# Patient Record
Sex: Female | Born: 1950 | Race: White | Hispanic: No | Smoking: Former smoker
Health system: Southern US, Community
[De-identification: ages and names within clinical notes are randomized; demographics above are authoritative.]

## PROBLEM LIST (undated history)

## (undated) DIAGNOSIS — F419 Anxiety disorder, unspecified: Secondary | ICD-10-CM

## (undated) DIAGNOSIS — F0280 Dementia in other diseases classified elsewhere without behavioral disturbance: Secondary | ICD-10-CM

## (undated) DIAGNOSIS — R011 Cardiac murmur, unspecified: Secondary | ICD-10-CM

## (undated) DIAGNOSIS — IMO0001 Reserved for inherently not codable concepts without codable children: Secondary | ICD-10-CM

## (undated) DIAGNOSIS — D649 Anemia, unspecified: Secondary | ICD-10-CM

## (undated) DIAGNOSIS — K219 Gastro-esophageal reflux disease without esophagitis: Secondary | ICD-10-CM

## (undated) DIAGNOSIS — I700 Atherosclerosis of aorta: Secondary | ICD-10-CM

## (undated) DIAGNOSIS — G8929 Other chronic pain: Secondary | ICD-10-CM

## (undated) DIAGNOSIS — G300 Alzheimer's disease with early onset: Secondary | ICD-10-CM

## (undated) DIAGNOSIS — F319 Bipolar disorder, unspecified: Secondary | ICD-10-CM

## (undated) DIAGNOSIS — E7800 Pure hypercholesterolemia, unspecified: Secondary | ICD-10-CM

## (undated) DIAGNOSIS — I5030 Unspecified diastolic (congestive) heart failure: Secondary | ICD-10-CM

## (undated) DIAGNOSIS — J449 Chronic obstructive pulmonary disease, unspecified: Secondary | ICD-10-CM

## (undated) DIAGNOSIS — K315 Obstruction of duodenum: Secondary | ICD-10-CM

## (undated) DIAGNOSIS — M1990 Unspecified osteoarthritis, unspecified site: Secondary | ICD-10-CM

## (undated) DIAGNOSIS — I10 Essential (primary) hypertension: Secondary | ICD-10-CM

## (undated) DIAGNOSIS — M549 Dorsalgia, unspecified: Secondary | ICD-10-CM

## (undated) HISTORY — DX: Bipolar disorder, unspecified: F31.9

## (undated) HISTORY — DX: Gastro-esophageal reflux disease without esophagitis: K21.9

## (undated) HISTORY — DX: Pure hypercholesterolemia, unspecified: E78.00

## (undated) HISTORY — PX: FOOT SURGERY: SHX648

## (undated) HISTORY — PX: COLONOSCOPY: SHX174

## (undated) HISTORY — PX: HEMORRHOID SURGERY: SHX153

## (undated) HISTORY — DX: Obstruction of duodenum: K31.5

---

## 2001-03-10 ENCOUNTER — Emergency Department (HOSPITAL_COMMUNITY): Admission: EM | Admit: 2001-03-10 | Discharge: 2001-03-10 | Payer: Self-pay | Admitting: Emergency Medicine

## 2001-03-12 ENCOUNTER — Emergency Department (HOSPITAL_COMMUNITY): Admission: EM | Admit: 2001-03-12 | Discharge: 2001-03-12 | Payer: Self-pay | Admitting: Emergency Medicine

## 2001-06-16 ENCOUNTER — Emergency Department (HOSPITAL_COMMUNITY): Admission: EM | Admit: 2001-06-16 | Discharge: 2001-06-16 | Payer: Self-pay | Admitting: Emergency Medicine

## 2001-06-17 ENCOUNTER — Encounter: Payer: Self-pay | Admitting: Emergency Medicine

## 2001-06-17 ENCOUNTER — Emergency Department (HOSPITAL_COMMUNITY): Admission: EM | Admit: 2001-06-17 | Discharge: 2001-06-17 | Payer: Self-pay | Admitting: Internal Medicine

## 2001-06-19 ENCOUNTER — Ambulatory Visit (HOSPITAL_COMMUNITY): Admission: RE | Admit: 2001-06-19 | Discharge: 2001-06-19 | Payer: Self-pay | Admitting: Obstetrics and Gynecology

## 2001-06-19 ENCOUNTER — Encounter: Payer: Self-pay | Admitting: Obstetrics and Gynecology

## 2004-04-29 ENCOUNTER — Ambulatory Visit (HOSPITAL_COMMUNITY): Admission: RE | Admit: 2004-04-29 | Discharge: 2004-04-29 | Payer: Self-pay | Admitting: Pulmonary Disease

## 2005-02-15 ENCOUNTER — Ambulatory Visit: Payer: Self-pay | Admitting: Family Medicine

## 2005-03-01 ENCOUNTER — Ambulatory Visit: Payer: Self-pay | Admitting: Family Medicine

## 2005-04-25 ENCOUNTER — Ambulatory Visit: Payer: Self-pay | Admitting: Family Medicine

## 2005-05-16 ENCOUNTER — Ambulatory Visit: Payer: Self-pay | Admitting: Family Medicine

## 2005-06-06 ENCOUNTER — Ambulatory Visit: Payer: Self-pay | Admitting: Family Medicine

## 2005-07-06 ENCOUNTER — Ambulatory Visit: Payer: Self-pay | Admitting: Family Medicine

## 2005-07-26 ENCOUNTER — Ambulatory Visit: Payer: Self-pay | Admitting: Family Medicine

## 2005-09-02 ENCOUNTER — Ambulatory Visit: Payer: Self-pay | Admitting: Family Medicine

## 2005-09-13 ENCOUNTER — Ambulatory Visit: Payer: Self-pay | Admitting: Family Medicine

## 2006-02-17 ENCOUNTER — Ambulatory Visit: Payer: Self-pay | Admitting: Family Medicine

## 2006-03-03 ENCOUNTER — Ambulatory Visit: Payer: Self-pay | Admitting: Family Medicine

## 2006-04-19 ENCOUNTER — Ambulatory Visit: Payer: Self-pay | Admitting: Family Medicine

## 2006-10-03 ENCOUNTER — Ambulatory Visit: Payer: Self-pay | Admitting: Family Medicine

## 2006-10-31 ENCOUNTER — Ambulatory Visit: Payer: Self-pay | Admitting: Family Medicine

## 2006-11-29 ENCOUNTER — Ambulatory Visit: Payer: Self-pay | Admitting: Family Medicine

## 2007-01-12 ENCOUNTER — Ambulatory Visit: Payer: Self-pay | Admitting: Family Medicine

## 2007-02-12 ENCOUNTER — Ambulatory Visit: Payer: Self-pay | Admitting: Family Medicine

## 2010-09-26 ENCOUNTER — Encounter: Payer: Self-pay | Admitting: Family Medicine

## 2010-12-22 ENCOUNTER — Other Ambulatory Visit: Payer: Self-pay | Admitting: Neurosurgery

## 2010-12-22 DIAGNOSIS — M47816 Spondylosis without myelopathy or radiculopathy, lumbar region: Secondary | ICD-10-CM

## 2010-12-30 ENCOUNTER — Inpatient Hospital Stay
Admission: RE | Admit: 2010-12-30 | Discharge: 2010-12-30 | Payer: Self-pay | Source: Ambulatory Visit | Attending: Neurosurgery | Admitting: Neurosurgery

## 2011-01-03 ENCOUNTER — Ambulatory Visit
Admission: RE | Admit: 2011-01-03 | Discharge: 2011-01-03 | Disposition: A | Discharge status: Home / Self Care | Payer: Medicare Other | Source: Ambulatory Visit | Attending: Neurosurgery | Admitting: Neurosurgery

## 2011-01-03 DIAGNOSIS — M47816 Spondylosis without myelopathy or radiculopathy, lumbar region: Secondary | ICD-10-CM

## 2011-01-21 NOTE — Procedures (Signed)
NAME:  Kathleen Valenzuela, Kathleen Valenzuela                           ACCOUNT NO.:  [id number]   MEDICAL RECORD NO.:  [id number]                   PATIENT TYPE:  OUT   LOCATION:  RAD                                  FACILITY:  [facility]   PHYSICIAN:  Darlin Priestly, M.D.             DATE OF BIRTH:  1951/04/25   DATE OF PROCEDURE:  DATE OF DISCHARGE:                                  ECHOCARDIOGRAM   INDICATIONS:  Patient is a 60 year old female, patient of Dr. Juanetta Gosling with a  history of shortness of breath, COPD, and history of rheumatic fever. She is  now here for a 2-D echocardiogram to evaluate LV function and valvular  structures.   FINDINGS:  1. The aorta is within normal limits at 3.3 cm.  2. The left atrium is bilaterally enlarged to 4.1 cm. There are no clots     seen.  The patient is in sinus rhythm during the procedure.  3. IVS noted to be within upper limits of normal at 1.3 and 1.8 cm     respectively.  4. The aortic valve appears to be mildly thickened with no evidence of     significant aortic stenosis and trivial aortic regurgitation.  5. The mitral valve leaflets are mildly thickened, anterior leaflet greater     than posterior.  There does not appear to be any significant prolapse.     There is a mild-to-moderate regurgitation.  6. Obstruction of the normal tricuspid valve with trivial tricuspid     regurgitation.  7. Left ventricular internal dimensions within normal limits at 4.0 and 2.9     cm respectively.  There is good overall left ventricular function with an     estimated EF of 60% with no segmental wall motion abnormality visualized.  8. Normal RV size and systolic function.   CONCLUSIONS:  1. Normal LV side and systolic function estimated at 60%.  2. Mildly thickened aortic valve with no evidence of significant aortic     stenosis and trivial aortic regurgitation.  3. Mildly thickened mitral valve leaflets, anterior greater than posterior.     There does appear to be  mild-to-moderate mitral regurgitation.  4. Obstruction of normal tricuspid valve with trivial tricuspid     regurgitation.  5. Normal RV size and systolic function.  6. Mild left atrial enlargement.      ___________________________________________                                            Darlin Priestly, M.D.   RHM/MEDQ  D:  04/29/2004  T:  04/29/2004  Job:  [id number]   cc:   Ramon Dredge L. Juanetta Gosling, M.D.  [address]  [place]  [place] [address]  Fax: [phone]

## 2015-07-23 ENCOUNTER — Encounter (INDEPENDENT_AMBULATORY_CARE_PROVIDER_SITE_OTHER): Payer: Self-pay | Admitting: *Deleted

## 2015-08-03 ENCOUNTER — Ambulatory Visit (INDEPENDENT_AMBULATORY_CARE_PROVIDER_SITE_OTHER): Payer: Medicare Other | Admitting: Internal Medicine

## 2015-08-03 ENCOUNTER — Encounter (INDEPENDENT_AMBULATORY_CARE_PROVIDER_SITE_OTHER): Payer: Self-pay | Admitting: Internal Medicine

## 2015-08-03 VITALS — BP 96/84 | HR 65 | Temp 98.3°F | Ht 62.0 in | Wt 162.9 lb

## 2015-08-03 DIAGNOSIS — J441 Chronic obstructive pulmonary disease with (acute) exacerbation: Secondary | ICD-10-CM | POA: Diagnosis not present

## 2015-08-03 DIAGNOSIS — K219 Gastro-esophageal reflux disease without esophagitis: Secondary | ICD-10-CM | POA: Insufficient documentation

## 2015-08-03 DIAGNOSIS — E7800 Pure hypercholesterolemia, unspecified: Secondary | ICD-10-CM | POA: Insufficient documentation

## 2015-08-03 DIAGNOSIS — K315 Obstruction of duodenum: Secondary | ICD-10-CM | POA: Insufficient documentation

## 2015-08-03 DIAGNOSIS — R1310 Dysphagia, unspecified: Secondary | ICD-10-CM

## 2015-08-03 DIAGNOSIS — F319 Bipolar disorder, unspecified: Secondary | ICD-10-CM | POA: Insufficient documentation

## 2015-08-03 NOTE — Patient Instructions (Signed)
DG esophagram.   

## 2015-08-03 NOTE — Progress Notes (Signed)
Subjective:    Patient ID: Kathleen Valenzuela, female    DOB: [birth year], 64 y.o.   MRN: [id number]  HPI Referred by Dr. Wenda Overland. Patient is a resident of [facility] in [place].  She tells me she is having problems with swallowing liquids.  When she takes her medications, they will not go down.  She tells me she has lost some weight. She thinks she may have lost about 30 pounds since March. Her appetite is good for the most part.  She is not having any trouble swallowing foods. She is having problems with liquids. No abdominal pain. Acid reflux is controlled with Nexium. No side effects from the Reglan. BM x 1 a day usually. No melena or BRRB.  Denies NSAIDs or BC powders. Previous patient of Dr. Britta Mccreedy  12/12/2014 H and H  11.0 and 35.1, platelet ct 2`4, Albumin 3.8, ALP 79, AST 17, Total bili 0.1, ALT 13  10/16/2014 EGD, ERCP: Dr. Britta Mccreedy:  abdominal pain, hx of duodenal stricture.   Duodenal stricture. Food within the stomach suggestive of delayed gastric emptying, likely secondary to duodenal stricture.  Stricurtre dilated at 12 mm, 13.28mm and finally 74mm.  ERCP: There were no obvious filling defects within the CBD. No evidence of masses, strictures, stones or papillary stenosis. She was able to drain adequately, her liver enzymes were normal, so sphincterotomy was not performed.  Review of Systems Past Medical History  Diagnosis Date  . Duodenal stricture   . High cholesterol   . Bipolar 1 disorder ([place])   . GERD (gastroesophageal reflux disease)     Past Surgical History  Procedure Laterality Date  . Foot surgery      No Known Allergies  No current outpatient prescriptions on file prior to visit.   No current facility-administered medications on file prior to visit.   No current outpatient prescriptions on file prior to visit.   No current facility-administered medications on file prior to visit.   Current Outpatient Prescriptions  Medication Sig Dispense Refill  . albuterol  (PROVENTIL HFA;VENTOLIN HFA) 108 (90 BASE) MCG/ACT inhaler Inhale into the lungs every 6 (six) hours as needed for wheezing or shortness of breath.    . ALPRAZolam (XANAX) 1 MG tablet Take 1 mg by mouth 3 (three) times daily.    Marland Kitchen atorvastatin (LIPITOR) 10 MG tablet Take 10 mg by mouth daily.    . Bisacodyl (DUCODYL PO) Take 10 mg by mouth.    Marland Kitchen buPROPion (WELLBUTRIN XL) 300 MG 24 hr tablet Take 300 mg by mouth daily.    Marland Kitchen dicyclomine (BENTYL) 20 MG tablet Take 20 mg by mouth 4 (four) times daily -  before meals and at bedtime.    . donepezil (ARICEPT) 5 MG tablet Take 5 mg by mouth at bedtime.    Marland Kitchen esomeprazole (NEXIUM) 40 MG capsule Take 40 mg by mouth daily at 12 noon.    . fluticasone (VERAMYST) 27.5 MCG/SPRAY nasal spray Place 2 sprays into the nose daily.    . furosemide (LASIX) 40 MG tablet Take 40 mg by mouth.    . lithium 300 MG tablet Take 300 mg by mouth 3 (three) times daily.    . meloxicam (MOBIC) 15 MG tablet Take 15 mg by mouth daily.    . Menthol-Zinc Oxide (RISAMINE) 0.44-20.625 % OINT Apply topically.    . metoCLOPramide (REGLAN) 10 MG tablet Take 10 mg by mouth 4 (four) times daily -  before meals and at bedtime.    Marland Kitchen  metoprolol succinate (TOPROL-XL) 50 MG 24 hr tablet Take 50 mg by mouth daily. Take with or immediately following a meal.    . montelukast (SINGULAIR) 10 MG tablet Take 10 mg by mouth at bedtime.    . nicotine (NICODERM CQ - DOSED IN MG/24 HOURS) 14 mg/24hr patch Place 14 mg onto the skin daily.    . Olopatadine HCl (PATADAY) 0.2 % SOLN Apply to eye.    Marland Kitchen oxyCODONE (ROXICODONE) 15 MG immediate release tablet Take 15 mg by mouth every 4 (four) hours as needed for pain.    . tizanidine (ZANAFLEX) 2 MG capsule Take 2 mg by mouth 2 (two) times daily before a meal.    . traZODone (DESYREL) 50 MG tablet Take 50 mg by mouth at bedtime.     No current facility-administered medications for this visit.        Objective:   Physical ExamBlood pressure 96/84, pulse 65,  temperature 98.3 F (36.8 C), height 5\' 2"  (1.575 m), weight 162 lb 14.4 oz (73.891 kg). Alert and oriented. Skin warm and dry. Oral mucosa is moist.   . Sclera anicteric, conjunctivae is pink. Thyroid not enlarged. No cervical lymphadenopathy. Lungs clear. Heart regular rate and rhythm.  Abdomen is soft. Bowel sounds are positive. No hepatomegaly. No abdominal masses felt. No tenderness.  No edema to lower extremities.         Assessment & Plan:  Dysphagia to liquids. Hx of duodenal stricture. Am going to get a DG esophagram. Further recommendations to follow.

## 2015-08-06 ENCOUNTER — Other Ambulatory Visit (HOSPITAL_COMMUNITY): Payer: Medicare Other

## 2015-08-12 ENCOUNTER — Other Ambulatory Visit (HOSPITAL_COMMUNITY): Payer: Medicare Other

## 2015-09-22 ENCOUNTER — Other Ambulatory Visit (HOSPITAL_COMMUNITY): Payer: Medicare Other

## 2015-09-24 ENCOUNTER — Ambulatory Visit (HOSPITAL_COMMUNITY)
Admission: RE | Admit: 2015-09-24 | Discharge: 2015-09-24 | Disposition: A | Discharge status: Home / Self Care | Payer: Medicare Other | Source: Ambulatory Visit | Attending: Internal Medicine | Admitting: Internal Medicine

## 2015-09-24 ENCOUNTER — Other Ambulatory Visit (INDEPENDENT_AMBULATORY_CARE_PROVIDER_SITE_OTHER): Payer: Self-pay | Admitting: Internal Medicine

## 2015-09-24 ENCOUNTER — Encounter (INDEPENDENT_AMBULATORY_CARE_PROVIDER_SITE_OTHER): Payer: Self-pay | Admitting: *Deleted

## 2015-09-24 DIAGNOSIS — R1310 Dysphagia, unspecified: Secondary | ICD-10-CM | POA: Insufficient documentation

## 2015-09-24 DIAGNOSIS — R933 Abnormal findings on diagnostic imaging of other parts of digestive tract: Secondary | ICD-10-CM

## 2015-09-24 DIAGNOSIS — K222 Esophageal obstruction: Secondary | ICD-10-CM | POA: Insufficient documentation

## 2015-09-24 DIAGNOSIS — K224 Dyskinesia of esophagus: Secondary | ICD-10-CM | POA: Diagnosis not present

## 2015-10-29 ENCOUNTER — Encounter (HOSPITAL_COMMUNITY): Payer: Self-pay | Admitting: *Deleted

## 2015-10-29 ENCOUNTER — Encounter (HOSPITAL_COMMUNITY)
Admission: RE | Disposition: A | Discharge status: Home / Self Care | Payer: Self-pay | Source: Ambulatory Visit | Attending: Internal Medicine

## 2015-10-29 ENCOUNTER — Ambulatory Visit (HOSPITAL_COMMUNITY)
Admission: RE | Admit: 2015-10-29 | Discharge: 2015-10-29 | Disposition: A | Discharge status: Home / Self Care | Payer: Medicare Other | Source: Ambulatory Visit | Attending: Internal Medicine | Admitting: Internal Medicine

## 2015-10-29 DIAGNOSIS — K219 Gastro-esophageal reflux disease without esophagitis: Secondary | ICD-10-CM | POA: Insufficient documentation

## 2015-10-29 DIAGNOSIS — K208 Other esophagitis: Secondary | ICD-10-CM | POA: Diagnosis not present

## 2015-10-29 DIAGNOSIS — Z791 Long term (current) use of non-steroidal anti-inflammatories (NSAID): Secondary | ICD-10-CM | POA: Diagnosis not present

## 2015-10-29 DIAGNOSIS — K2210 Ulcer of esophagus without bleeding: Secondary | ICD-10-CM | POA: Diagnosis not present

## 2015-10-29 DIAGNOSIS — F319 Bipolar disorder, unspecified: Secondary | ICD-10-CM | POA: Diagnosis not present

## 2015-10-29 DIAGNOSIS — E7800 Pure hypercholesterolemia, unspecified: Secondary | ICD-10-CM | POA: Diagnosis not present

## 2015-10-29 DIAGNOSIS — I10 Essential (primary) hypertension: Secondary | ICD-10-CM | POA: Diagnosis not present

## 2015-10-29 DIAGNOSIS — K210 Gastro-esophageal reflux disease with esophagitis: Secondary | ICD-10-CM | POA: Diagnosis not present

## 2015-10-29 DIAGNOSIS — Z79899 Other long term (current) drug therapy: Secondary | ICD-10-CM | POA: Diagnosis not present

## 2015-10-29 DIAGNOSIS — K222 Esophageal obstruction: Secondary | ICD-10-CM | POA: Insufficient documentation

## 2015-10-29 DIAGNOSIS — K315 Obstruction of duodenum: Secondary | ICD-10-CM | POA: Insufficient documentation

## 2015-10-29 DIAGNOSIS — K449 Diaphragmatic hernia without obstruction or gangrene: Secondary | ICD-10-CM

## 2015-10-29 DIAGNOSIS — R1310 Dysphagia, unspecified: Secondary | ICD-10-CM

## 2015-10-29 DIAGNOSIS — K259 Gastric ulcer, unspecified as acute or chronic, without hemorrhage or perforation: Secondary | ICD-10-CM

## 2015-10-29 DIAGNOSIS — R933 Abnormal findings on diagnostic imaging of other parts of digestive tract: Secondary | ICD-10-CM

## 2015-10-29 DIAGNOSIS — F419 Anxiety disorder, unspecified: Secondary | ICD-10-CM | POA: Diagnosis not present

## 2015-10-29 DIAGNOSIS — Z7951 Long term (current) use of inhaled steroids: Secondary | ICD-10-CM | POA: Insufficient documentation

## 2015-10-29 DIAGNOSIS — J449 Chronic obstructive pulmonary disease, unspecified: Secondary | ICD-10-CM | POA: Insufficient documentation

## 2015-10-29 DIAGNOSIS — Z87891 Personal history of nicotine dependence: Secondary | ICD-10-CM | POA: Insufficient documentation

## 2015-10-29 HISTORY — DX: Anxiety disorder, unspecified: F41.9

## 2015-10-29 HISTORY — PX: ESOPHAGEAL DILATION: SHX303

## 2015-10-29 HISTORY — DX: Chronic obstructive pulmonary disease, unspecified: J44.9

## 2015-10-29 HISTORY — DX: Reserved for inherently not codable concepts without codable children: IMO0001

## 2015-10-29 HISTORY — PX: ESOPHAGOGASTRODUODENOSCOPY: SHX5428

## 2015-10-29 HISTORY — DX: Essential (primary) hypertension: I10

## 2015-10-29 SURGERY — EGD (ESOPHAGOGASTRODUODENOSCOPY)
Anesthesia: Moderate Sedation

## 2015-10-29 MED ORDER — MIDAZOLAM HCL 5 MG/5ML IJ SOLN
INTRAMUSCULAR | Status: AC
  Filled 2015-10-29: qty 10

## 2015-10-29 MED ORDER — MEPERIDINE HCL 50 MG/ML IJ SOLN
INTRAMUSCULAR | Status: DC | PRN
  Administered 2015-10-29 (×2): 25 mg via INTRAVENOUS

## 2015-10-29 MED ORDER — STERILE WATER FOR IRRIGATION IR SOLN
Status: DC | PRN
  Administered 2015-10-29: 12:00:00

## 2015-10-29 MED ORDER — MIDAZOLAM HCL 5 MG/5ML IJ SOLN
INTRAMUSCULAR | Status: DC | PRN
  Administered 2015-10-29 (×4): 2 mg via INTRAVENOUS

## 2015-10-29 MED ORDER — MEPERIDINE HCL 50 MG/ML IJ SOLN
INTRAMUSCULAR | Status: AC
  Filled 2015-10-29: qty 1

## 2015-10-29 MED ORDER — PANTOPRAZOLE SODIUM 40 MG PO TBEC: 40.0000 mg | DELAYED_RELEASE_TABLET | Freq: Two times a day (BID) | ORAL | Status: DC

## 2015-10-29 MED ORDER — SODIUM CHLORIDE 0.9 % IV SOLN
INTRAVENOUS | Status: DC
  Administered 2015-10-29: 11:00:00 via INTRAVENOUS

## 2015-10-29 MED ORDER — BUTAMBEN-TETRACAINE-BENZOCAINE 2-2-14 % EX AERO
INHALATION_SPRAY | CUTANEOUS | Status: DC | PRN
  Administered 2015-10-29: 2 via TOPICAL

## 2015-10-29 NOTE — Discharge Instructions (Signed)
Do not take ibuprofen meloxicam or similar medications.  Can take Tylenol up to 2 g per day in divided dose as needed.  Resume other medications as before.  Check with your physician if you could come off dicyclomine until.  Physician will call with biopsy results.  No driving for 24 hours.  Repeat EGD with dilation of esophageal and duodenal stricture in 4 weeks.     Esophagogastroduodenoscopy, Care After Refer to this sheet in the next few weeks. These instructions provide you with information about caring for yourself after your procedure. Your health care provider may also give you more specific instructions. Your treatment has been planned according to current medical practices, but problems sometimes occur. Call your health care provider if you have any problems or questions after your procedure. WHAT TO EXPECT AFTER THE PROCEDURE After your procedure, it is typical to feel:  Soreness in your throat.  Pain with swallowing.  Sick to your stomach (nauseous).  Bloated.  Dizzy.  Fatigued. HOME CARE INSTRUCTIONS  Do not eat or drink anything until the numbing medicine (local anesthetic) has worn off and your gag reflex has returned. You will know that the local anesthetic has worn off when you can swallow comfortably.  Do not drive or operate machinery until directed by your health care provider.  Take medicines only as directed by your health care provider. SEEK MEDICAL CARE IF:   You cannot stop coughing.  You are not urinating at all or less than usual. SEEK IMMEDIATE MEDICAL CARE IF:  You have difficulty swallowing.  You cannot eat or drink.  You have worsening throat or chest pain.  You have dizziness or lightheadedness or you faint.  You have nausea or vomiting.  You have chills.  You have a fever.  You have severe abdominal pain.  You have black, tarry, or bloody stools.   This information is not intended to replace advice given to you by your  health care provider. Make sure you discuss any questions you have with your health care provider.   Document Released: 08/08/2012 Document Revised: 09/12/2014 Document Reviewed: 08/08/2012 Elsevier Interactive Patient Education 2016 Elsevier Inc.  Esophageal Dilatation Esophageal dilatation is a procedure to open a blocked or narrowed part of the esophagus. The esophagus is the long tube in your throat that carries food and liquid from your mouth to your stomach. The procedure is also called esophageal dilation.  You may need this procedure if you have a buildup of scar tissue in your esophagus that makes it difficult, painful, or even impossible to swallow. This can be caused by gastroesophageal reflux disease (GERD). In rare cases, people need this procedure because they have cancer of the esophagus or a problem with the way food moves through the esophagus. Sometimes you may need to have another dilatation to enlarge the opening of the esophagus gradually. LET [facility] CARE PROVIDER KNOW ABOUT:  Any allergies you have. All medicines you are taking, including vitamins, herbs, eye drops, creams, and over-the-counter medicines. Previous problems you or members of your family have had with the use of anesthetics. Any blood disorders you have. Previous surgeries you have had. Medical conditions you have. Any antibiotic medicines you are required to take before dental procedures. RISKS AND COMPLICATIONS Generally, this is a safe procedure. However, problems can occur and include: Bleeding from a tear in the lining of the esophagus. A hole (perforation) in the esophagus. BEFORE THE PROCEDURE Do not eat or drink anything after midnight on  the night before the procedure or as directed by your health care provider. Ask your health care provider about changing or stopping your regular medicines. This is especially important if you are taking diabetes medicines or blood thinners. Plan to have  someone take you home after the procedure. PROCEDURE  You will be given a medicine that makes you relaxed and sleepy (sedative). A medicine may be sprayed or gargled to numb the back of the throat. Your health care provider can use various instruments to do an esophageal dilatation. During the procedure, the instrument used will be placed in your mouth and passed down into your esophagus. Options include: Simple dilators. This instrument is carefully placed in the esophagus to stretch it. Guided wire bougies. In this method, a flexible tube (endoscope) is used to insert a wire into the esophagus. The dilator is passed over this wire to enlarge the esophagus. Then the wire is removed. Balloon dilators. An endoscope with a small balloon at the end is passed down into the esophagus. Inflating the balloon gently stretches the esophagus and opens it up. AFTER THE PROCEDURE Your blood pressure, heart rate, breathing rate, and blood oxygen level will be monitored often until the medicines you were given have worn off. Your throat may feel slightly sore and will probably still feel numb. This will improve slowly over time. You will not be allowed to eat or drink until the throat numbness has resolved. If this is a same-day procedure, you may be allowed to go home once you have been able to drink, urinate, and sit on the edge of the bed without nausea or dizziness. If this is a same-day procedure, you should have a friend or family member with you for the next 24 hours after the procedure.   This information is not intended to replace advice given to you by your health care provider. Make sure you discuss any questions you have with your health care provider.   Document Released: 10/13/2005 Document Revised: 09/12/2014 Document Reviewed: 01/01/2014 Elsevier Interactive Patient Education [employer].

## 2015-10-29 NOTE — H&P (Signed)
Kathleen Valenzuela is an 65 y.o. female.   Chief Complaint:  Patient is here for EGD and ED. HPI:  Patient is 65 year old Caucasian female with multiple medical problems presents with few months history of dysphagia to solids. She states she had her esophagus dilated few years ago and it helped until recently. She has chronic GERD. She says Nexium is not controlling her heartburn anymore. She has very good appetite. She says she gained 12 pounds over the last few months. She denies abdominal pain melena or rectal bleeding.  Following her office visit she had barium study which suggested motility disorder and also revealed distal narrowing obstructing passage of barium pill.  Past Medical History  Diagnosis Date  . Duodenal stricture   . High cholesterol   . Bipolar 1 disorder ([place])   . GERD (gastroesophageal reflux disease)   . Anxiety   . COPD (chronic obstructive pulmonary disease) ([place])   . Shortness of breath dyspnea   . Hypertension     Past Surgical History  Procedure Laterality Date  . Foot surgery    . Colonoscopy      History reviewed. No pertinent family history. Social History:  reports that she has quit smoking. Her smoking use included Cigarettes. She has a 70 pack-year smoking history. She does not have any smokeless tobacco history on file. She reports that she does not drink alcohol or use illicit drugs.  Allergies: No Known Allergies  Medications Prior to Admission  Medication Sig Dispense Refill  . albuterol (PROVENTIL HFA;VENTOLIN HFA) 108 (90 BASE) MCG/ACT inhaler Inhale into the lungs every 6 (six) hours as needed for wheezing or shortness of breath.    . ALPRAZolam (XANAX) 1 MG tablet Take 1 mg by mouth 3 (three) times daily.    Marland Kitchen atorvastatin (LIPITOR) 10 MG tablet Take 10 mg by mouth daily.    Marland Kitchen buPROPion (WELLBUTRIN XL) 300 MG 24 hr tablet Take 300 mg by mouth daily.    Marland Kitchen dicyclomine (BENTYL) 20 MG tablet Take 20 mg by mouth 4 (four) times daily -  before meals  and at bedtime.    . docusate sodium (COLACE) 100 MG capsule Take 100 mg by mouth 2 (two) times daily.    Marland Kitchen donepezil (ARICEPT) 5 MG tablet Take 5 mg by mouth at bedtime.    Marland Kitchen esomeprazole (NEXIUM) 40 MG capsule Take 40 mg by mouth daily at 12 noon.    . fluticasone (VERAMYST) 27.5 MCG/SPRAY nasal spray Place 2 sprays into the nose daily.    . Fluticasone Furoate-Vilanterol (BREO ELLIPTA) 200-25 MCG/INH AEPB Inhale 1 puff into the lungs daily.    . furosemide (LASIX) 40 MG tablet Take 40 mg by mouth.    Marland Kitchen ipratropium-albuterol (DUONEB) 0.5-2.5 (3) MG/3ML SOLN Take 3 mLs by nebulization every 8 (eight) hours as needed (shortness of breath).    . lamoTRIgine (LAMICTAL) 100 MG tablet Take 100 mg by mouth 2 (two) times daily.    Marland Kitchen lithium 300 MG tablet Take 300 mg by mouth daily.     . meloxicam (MOBIC) 15 MG tablet Take 15 mg by mouth daily.    . Menthol-Zinc Oxide 0.44-20.625 % OINT Apply 1 application topically daily as needed (for rash and redness).    . metoCLOPramide (REGLAN) 10 MG tablet Take 10 mg by mouth 4 (four) times daily -  before meals and at bedtime.    . metoprolol succinate (TOPROL-XL) 50 MG 24 hr tablet Take 50 mg by mouth daily. Take  with or immediately following a meal.    . montelukast (SINGULAIR) 10 MG tablet Take 10 mg by mouth at bedtime.    . Olopatadine HCl (PATADAY) 0.2 % SOLN Apply to eye.    Marland Kitchen oxyCODONE (ROXICODONE) 15 MG immediate release tablet Take 15 mg by mouth every 4 (four) hours as needed for pain.    . tizanidine (ZANAFLEX) 2 MG capsule Take 2 mg by mouth 2 (two) times daily before a meal.    . traZODone (DESYREL) 50 MG tablet Take 50 mg by mouth at bedtime.    Marland Kitchen ibuprofen (ADVIL,MOTRIN) 400 MG tablet Take 800 mg by mouth every 6 (six) hours as needed for moderate pain.    . promethazine (PHENERGAN) 25 MG tablet Take 25 mg by mouth every 8 (eight) hours as needed for nausea or vomiting.      No results found for this or any previous visit (from the past 48  hour(s)). No results found.  ROS  Blood pressure 109/59, pulse 64, temperature 98.6 F (37 C), temperature source Oral, resp. rate 17, height 5\' 2"  (1.575 m), weight 162 lb (73.483 kg), SpO2 97 %. Physical Exam  Constitutional: She appears well-developed and well-nourished.  HENT:  Patient is edentulous.  Eyes: Conjunctivae are normal. No scleral icterus.  Neck: No thyromegaly present.  Cardiovascular: Normal rate, regular rhythm and normal heart sounds.   No murmur heard. Respiratory: Effort normal.  GI: Soft. She exhibits no distension and no mass. There is no tenderness.  Musculoskeletal: She exhibits no edema.  Lymphadenopathy:    She has no cervical adenopathy.  Neurological: She is alert.  Skin: Skin is warm and dry.     Assessment/Plan Solid food dysphagia in patient with chronic GERD.  Abnormal barium study suggesting motility disorder and distal esophageal stricture.  Rogene Houston, MD 10/29/2015, 11:50 AM

## 2015-10-29 NOTE — Op Note (Addendum)
EGD PROCEDURE REPORT  PATIENT:  Kathleen Valenzuela  MR#:  [id number] Birthdate:  1951-08-02, 65 y.o., female Endoscopist:  Dr. Rogene Houston, MD Referred By:  Dr.  Celedonio Savage, MD Procedure Date: 10/29/2015  Procedure:   EGD with ED  Indications:  Patient is 65 year old Caucasian female with multiple medical problems who presents with three-month history of dysphagia to solids as well as liquids. She has history of esophageal stricture which was confirmed on barium pill study. She also has history of duodenal stricture. She complains of frequent heartburn despite taking Nexium. Patient is on multiple medications including meloxicam and when necessary ibuprofen.           Informed Consent:  The risks, benefits, alternatives & imponderables which include, but are not limited to, bleeding, infection, perforation, drug reaction and potential missed lesion have been reviewed.  The potential for biopsy, lesion removal, esophageal dilation, etc. have also been discussed.  Questions have been answered.  All parties agreeable.  Please see history & physical in medical record for more information.  Medications:  Demerol 50 mg IV Versed 8 mg IV Cetacaine spray topically for oropharyngeal anesthesia  First dose administered at 1157 Last dose administered at 1215  Description of procedure:  The endoscope was introduced through the mouth and advanced to the second portion of the duodenum without difficulty or limitations. The mucosal surfaces were surveyed very carefully during advancement of the scope and upon withdrawal.  Findings:  Esophagus:   Mucosa of the proximal segment was normal. Few erosions noted admit esophagus proximal to high-grade stricture not allowing passage of scope. This stricture was dilated as below And endoscope passed distally. This stricture was about 2 cm proximal to GE junction. GEJ:  39 cm Hiatus:  37 cm Stomach:   Stomach was empty and distended very well with insufflation.  Folds in the proximal stomach were normal. Examination of mucosa at gastric body was normal. 10 mm ulcer noted in prepyloric region with clean base along with few erosions. Pyloric channel was patent. Annulus fundus and cardia were unremarkable. Duodenum:   Food debris noted in duodenal bulb. Stricture noted at angle of the duodenum. I was able to see the mucosa distal to it but could not pass the scope across it.  Therapeutic/Diagnostic Maneuvers Performed:    distal esophageal stricture was dilated with balloon dilator. Balloon diet was advanced under direct vision. Balloon dye was positioned across the stricture and insufflated to a diameter of 12 mm and subsequently 13 mm. Balloon was deflated and withdrawn.  Mucosal disruption noted in the segment post dilation. Multiple biopsies taken from esophageal mucosa proximal to stricture.  Complications:  none  EBL: minimal  Impression: High-grade strictured at distal esophagus proximal to GE junction.This stricture dilated from 12 to 13.5 mm with balloon dilator. Erosive reflux esophagitis. Small sliding hiatal hernia. Prepyloric gastric ulcer with antral erosions. Stricture noted at angle of duodenum. This stricture was not dilated today. Multiple biopsies taken from esophageal mucosa looking for eosinophilic esophagitis.  Recommendations:  Standard instructions given. Patient advised to discontinue meloxicam and do not take other NSAIDs. Can take Tylenol up to 2 g per day on as-needed basis. Discontinue Nexium as it is not working. Pantoprazole 40 mg by mouth twice a day If she needs stronger pain medication she will  contact Dr. Irene Shipper office. Patient will return for repeat EGD in order to redilate esophageal stricture along with duodenal stricture dilation in 4 weeks under monitored anesthesia care.  REHMAN,NAJEEB  U  10/29/2015  12:27 PM  CC: Dr. Celedonio Savage, MD & Dr. Rayne Du ref. provider found

## 2015-10-29 NOTE — Progress Notes (Signed)
Operative note from 10/29/2015 faxed to [facility]

## 2015-11-02 ENCOUNTER — Encounter (HOSPITAL_COMMUNITY): Payer: Self-pay | Admitting: Internal Medicine

## 2015-11-04 ENCOUNTER — Other Ambulatory Visit (INDEPENDENT_AMBULATORY_CARE_PROVIDER_SITE_OTHER): Payer: Self-pay | Admitting: Internal Medicine

## 2015-11-04 ENCOUNTER — Encounter (INDEPENDENT_AMBULATORY_CARE_PROVIDER_SITE_OTHER): Payer: Self-pay | Admitting: *Deleted

## 2015-11-04 DIAGNOSIS — K222 Esophageal obstruction: Secondary | ICD-10-CM

## 2015-11-23 ENCOUNTER — Encounter (INDEPENDENT_AMBULATORY_CARE_PROVIDER_SITE_OTHER): Payer: Self-pay | Admitting: *Deleted

## 2015-11-24 ENCOUNTER — Encounter (INDEPENDENT_AMBULATORY_CARE_PROVIDER_SITE_OTHER): Payer: Self-pay | Admitting: *Deleted

## 2015-11-24 ENCOUNTER — Inpatient Hospital Stay (HOSPITAL_COMMUNITY): Admission: RE | Admit: 2015-11-24 | Payer: Medicare Other | Source: Ambulatory Visit

## 2015-11-26 ENCOUNTER — Inpatient Hospital Stay (HOSPITAL_COMMUNITY): Admission: RE | Admit: 2015-11-26 | Payer: Medicare Other | Source: Ambulatory Visit

## 2015-11-30 ENCOUNTER — Other Ambulatory Visit (HOSPITAL_COMMUNITY)
Admission: RE | Admit: 2015-11-30 | Discharge: 2015-11-30 | Disposition: A | Discharge status: Home / Self Care | Payer: Medicare Other | Source: Other Acute Inpatient Hospital | Attending: Family Medicine | Admitting: Family Medicine

## 2015-11-30 DIAGNOSIS — D649 Anemia, unspecified: Secondary | ICD-10-CM | POA: Diagnosis present

## 2015-11-30 LAB — CBC WITH DIFFERENTIAL/PLATELET
BASOS ABS: 0.1 10*3/uL (ref 0.0–0.1)
BASOS PCT: 1 %
EOS ABS: 0.7 10*3/uL (ref 0.0–0.7)
EOS PCT: 8 %
HCT: 36.1 % (ref 36.0–46.0)
HEMOGLOBIN: 10.9 g/dL — AB (ref 12.0–15.0)
LYMPHS ABS: 1.7 10*3/uL (ref 0.7–4.0)
Lymphocytes Relative: 20 %
MCH: 24.4 pg — ABNORMAL LOW (ref 26.0–34.0)
MCHC: 30.2 g/dL (ref 30.0–36.0)
MCV: 80.9 fL (ref 78.0–100.0)
Monocytes Absolute: 0.9 10*3/uL (ref 0.1–1.0)
Monocytes Relative: 11 %
NEUTROS PCT: 61 %
Neutro Abs: 5.2 10*3/uL (ref 1.7–7.7)
PLATELETS: 279 10*3/uL (ref 150–400)
RBC: 4.46 MIL/uL (ref 3.87–5.11)
RDW: 26.1 % — ABNORMAL HIGH (ref 11.5–15.5)
WBC: 8.5 10*3/uL (ref 4.0–10.5)

## 2015-12-01 ENCOUNTER — Other Ambulatory Visit (HOSPITAL_COMMUNITY)
Admission: RE | Admit: 2015-12-01 | Discharge: 2015-12-01 | Disposition: A | Discharge status: Home / Self Care | Payer: Medicare Other | Source: Other Acute Inpatient Hospital | Attending: Family Medicine | Admitting: Family Medicine

## 2015-12-01 DIAGNOSIS — F319 Bipolar disorder, unspecified: Secondary | ICD-10-CM | POA: Diagnosis present

## 2015-12-01 LAB — LITHIUM LEVEL: Lithium Lvl: 0.21 mmol/L — ABNORMAL LOW (ref 0.60–1.20)

## 2015-12-22 NOTE — Patient Instructions (Signed)
Lamia Negrin Cude  12/22/2015     @PREFPERIOPPHARMACY @   Your procedure is scheduled on  12/25/2015   Report to [facility] at  700  A.M.  Call this number if you have problems the morning of surgery:  [phone]   Remember:  Do not eat food or drink liquids after midnight.  Take these medicines the morning of surgery with A SIP OF WATER  Xanax, wellbutrin, aricept, nexium, mobic, reglan, metoprolol, singulair, oxycodone, singulair, protonix phenergan, zanaflex. Take your inhlaers before you come. Take your nebulizer before you come.   Do not wear jewelry, make-up or nail polish.  Do not wear lotions, powders, or perfumes.  You may wear deodorant.  Do not shave 48 hours prior to surgery.  Men may shave face and neck.  Do not bring valuables to the hospital.  [facility] is not responsible for any belongings or valuables.  Contacts, dentures or bridgework may not be worn into surgery.  Leave your suitcase in the car.  After surgery it may be brought to your room.  For patients admitted to the hospital, discharge time will be determined by your treatment team.  Patients discharged the day of surgery will not be allowed to drive home.   Name and phone number of your driver:   family Special instructions:  Follow the diet instructions given to you by Dr Olevia Perches office.  Please read over the following fact sheets that you were given. Coughing and Deep Breathing, Surgical Site Infection Prevention, Anesthesia Post-op Instructions and Care and Recovery After Surgery      Esophagogastroduodenoscopy Esophagogastroduodenoscopy (EGD) is a procedure that is used to examine the lining of the esophagus, stomach, and first part of the small intestine (duodenum). A long, flexible, lighted tube with a camera attached (endoscope) is inserted down the throat to view these organs. This procedure is done to detect problems or abnormalities, such as inflammation, bleeding, ulcers, or  growths, in order to treat them. The procedure lasts 5-20 minutes. It is usually an outpatient procedure, but it may need to be performed in a hospital in emergency cases. LET [facility] CARE PROVIDER KNOW ABOUT:  Any allergies you have.  All medicines you are taking, including vitamins, herbs, eye drops, creams, and over-the-counter medicines.  Previous problems you or members of your family have had with the use of anesthetics.  Any blood disorders you have.  Previous surgeries you have had.  Medical conditions you have. RISKS AND COMPLICATIONS Generally, this is a safe procedure. However, problems can occur and include:  Infection.  Bleeding.  Tearing (perforation) of the esophagus, stomach, or duodenum.  Difficulty breathing or not being able to breathe.  Excessive sweating.  Spasms of the larynx.  Slowed heartbeat.  Low blood pressure. BEFORE THE PROCEDURE  Do not eat or drink anything after midnight on the night before the procedure or as directed by your health care provider.  Do not take your regular medicines before the procedure if your health care provider asks you not to. Ask your health care provider about changing or stopping those medicines.  If you wear dentures, be prepared to remove them before the procedure.  Arrange for someone to drive you home after the procedure. PROCEDURE  A numbing medicine (local anesthetic) may be sprayed in your throat for comfort and to stop you from gagging or coughing.  You will have an IV tube inserted in a vein in your  [place] or arm. You will receive medicines and fluids through this tube.  You will be given a medicine to relax you (sedative).  A pain reliever will be given through the IV tube.  A mouth guard may be placed in your mouth to protect your teeth and to keep you from biting on the endoscope.  You will be asked to lie on your left side.  The endoscope will be inserted down your throat and into your  esophagus, stomach, and duodenum.  Air will be put through the endoscope to allow your health care provider to clearly view the lining of your esophagus.  The lining of your esophagus, stomach, and duodenum will be examined. During the exam, your health care provider may:  Remove tissue to be examined under a microscope (biopsy) for inflammation, infection, or other medical problems.  Remove growths.  Remove objects (foreign bodies) that are stuck.  Treat any bleeding with medicines or other devices that stop tissues from bleeding (hot cautery, clipping devices).  Widen (dilate) or stretch narrowed areas of your esophagus and stomach.  The endoscope will be withdrawn. AFTER THE PROCEDURE  You will be taken to a recovery area for observation. Your blood pressure, heart rate, breathing rate, and blood oxygen level will be monitored often until the medicines you were given have worn off.  Do not eat or drink anything until the numbing medicine has worn off and your gag reflex has returned. You may choke.  Your health care provider should be able to discuss his or her findings with you. It will take longer to discuss the test results if any biopsies were taken.   This information is not intended to replace advice given to you by your health care provider. Make sure you discuss any questions you have with your health care provider.   Document Released: 12/23/2004 Document Revised: 09/12/2014 Document Reviewed: 07/25/2012 Elsevier Interactive Patient Education 2016 [place]. Esophagogastroduodenoscopy, Care After Refer to this sheet in the next few weeks. These instructions provide you with information about caring for yourself after your procedure. Your health care provider may also give you more specific instructions. Your treatment has been planned according to current medical practices, but problems sometimes occur. Call your health care provider if you have any problems or questions  after your procedure. WHAT TO EXPECT AFTER THE PROCEDURE After your procedure, it is typical to feel:  Soreness in your throat.  Pain with swallowing.  Sick to your stomach (nauseous).  Bloated.  Dizzy.  Fatigued. HOME CARE INSTRUCTIONS  Do not eat or drink anything until the numbing medicine (local anesthetic) has worn off and your gag reflex has returned. You will know that the local anesthetic has worn off when you can swallow comfortably.  Do not drive or operate machinery until directed by your health care provider.  Take medicines only as directed by your health care provider. SEEK MEDICAL CARE IF:   You cannot stop coughing.  You are not urinating at all or less than usual. SEEK IMMEDIATE MEDICAL CARE IF:  You have difficulty swallowing.  You cannot eat or drink.  You have worsening throat or chest pain.  You have dizziness or lightheadedness or you faint.  You have nausea or vomiting.  You have chills.  You have a fever.  You have severe abdominal pain.  You have black, tarry, or bloody stools.   This information is not intended to replace advice given to you by your health care provider. Make sure  you discuss any questions you have with your health care provider.   Document Released: 08/08/2012 Document Revised: 09/12/2014 Document Reviewed: 08/08/2012 Elsevier Interactive Patient Education 2016 [employer] Esophageal Dilatation Esophageal dilatation is a procedure to open a blocked or narrowed part of the esophagus. The esophagus is the long tube in your throat that carries food and liquid from your mouth to your stomach. The procedure is also called esophageal dilation.  You may need this procedure if you have a buildup of scar tissue in your esophagus that makes it difficult, painful, or even impossible to swallow. This can be caused by gastroesophageal reflux disease (GERD). In rare cases, people need this procedure because they have cancer of the  esophagus or a problem with the way food moves through the esophagus. Sometimes you may need to have another dilatation to enlarge the opening of the esophagus gradually. LET [facility] CARE PROVIDER KNOW ABOUT:   Any allergies you have.  All medicines you are taking, including vitamins, herbs, eye drops, creams, and over-the-counter medicines.  Previous problems you or members of your family have had with the use of anesthetics.  Any blood disorders you have.  Previous surgeries you have had.  Medical conditions you have.  Any antibiotic medicines you are required to take before dental procedures. RISKS AND COMPLICATIONS Generally, this is a safe procedure. However, problems can occur and include:  Bleeding from a tear in the lining of the esophagus.  A hole (perforation) in the esophagus. BEFORE THE PROCEDURE  Do not eat or drink anything after midnight on the night before the procedure or as directed by your health care provider.  Ask your health care provider about changing or stopping your regular medicines. This is especially important if you are taking diabetes medicines or blood thinners.  Plan to have someone take you home after the procedure. PROCEDURE   You will be given a medicine that makes you relaxed and sleepy (sedative).  A medicine may be sprayed or gargled to numb the back of the throat.  Your health care provider can use various instruments to do an esophageal dilatation. During the procedure, the instrument used will be placed in your mouth and passed down into your esophagus. Options include:  Simple dilators. This instrument is carefully placed in the esophagus to stretch it.  Guided wire bougies. In this method, a flexible tube (endoscope) is used to insert a wire into the esophagus. The dilator is passed over this wire to enlarge the esophagus. Then the wire is removed.  Balloon dilators. An endoscope with a small balloon at the end is passed down  into the esophagus. Inflating the balloon gently stretches the esophagus and opens it up. AFTER THE PROCEDURE  Your blood pressure, heart rate, breathing rate, and blood oxygen level will be monitored often until the medicines you were given have worn off.  Your throat may feel slightly sore and will probably still feel numb. This will improve slowly over time.  You will not be allowed to eat or drink until the throat numbness has resolved.  If this is a same-day procedure, you may be allowed to go home once you have been able to drink, urinate, and sit on the edge of the bed without nausea or dizziness.  If this is a same-day procedure, you should have a friend or family member with you for the next 24 hours after the procedure.   This information is not intended to replace advice given to you  by your health care provider. Make sure you discuss any questions you have with your health care provider.   Document Released: 10/13/2005 Document Revised: 09/12/2014 Document Reviewed: 01/01/2014 Elsevier Interactive Patient Education 2016 [employer]. PATIENT INSTRUCTIONS POST-ANESTHESIA  IMMEDIATELY FOLLOWING SURGERY:  Do not drive or operate machinery for the first twenty four hours after surgery.  Do not make any important decisions for twenty four hours after surgery or while taking narcotic pain medications or sedatives.  If you develop intractable nausea and vomiting or a severe headache please notify your doctor immediately.  FOLLOW-UP:  Please make an appointment with your surgeon as instructed. You do not need to follow up with anesthesia unless specifically instructed to do so.  WOUND CARE INSTRUCTIONS (if applicable):  Keep a dry clean dressing on the anesthesia/puncture wound site if there is drainage.  Once the wound has quit draining you may leave it open to air.  Generally you should leave the bandage intact for twenty four hours unless there is drainage.  If the epidural site  drains for more than 36-48 hours please call the anesthesia department.  QUESTIONS?:  Please feel free to call your physician or the hospital operator if you have any questions, and they will be happy to assist you.

## 2015-12-23 ENCOUNTER — Other Ambulatory Visit: Payer: Self-pay

## 2015-12-23 ENCOUNTER — Encounter (HOSPITAL_COMMUNITY)
Admission: RE | Admit: 2015-12-23 | Discharge: 2015-12-23 | Disposition: A | Discharge status: Home / Self Care | Payer: Medicare Other | Source: Ambulatory Visit | Attending: Internal Medicine | Admitting: Internal Medicine

## 2015-12-23 ENCOUNTER — Encounter (HOSPITAL_COMMUNITY): Payer: Self-pay

## 2015-12-23 VITALS — BP 107/57 | HR 66 | Temp 98.2°F | Resp 20 | Ht 62.0 in | Wt 172.0 lb

## 2015-12-23 DIAGNOSIS — Z01812 Encounter for preprocedural laboratory examination: Secondary | ICD-10-CM | POA: Insufficient documentation

## 2015-12-23 DIAGNOSIS — K222 Esophageal obstruction: Secondary | ICD-10-CM

## 2015-12-23 DIAGNOSIS — Z01810 Encounter for preprocedural cardiovascular examination: Secondary | ICD-10-CM | POA: Insufficient documentation

## 2015-12-23 HISTORY — DX: Other chronic pain: G89.29

## 2015-12-23 HISTORY — DX: Dementia in other diseases classified elsewhere without behavioral disturbance: G30.0

## 2015-12-23 HISTORY — DX: Anemia, unspecified: D64.9

## 2015-12-23 HISTORY — DX: Dementia in other diseases classified elsewhere, unspecified severity, without behavioral disturbance, psychotic disturbance, mood disturbance, and anxiety: F02.80

## 2015-12-23 HISTORY — DX: Dorsalgia, unspecified: M54.9

## 2015-12-23 HISTORY — DX: Unspecified osteoarthritis, unspecified site: M19.90

## 2015-12-23 LAB — CBC WITH DIFFERENTIAL/PLATELET
BASOS PCT: 1 %
Basophils Absolute: 0.1 10*3/uL (ref 0.0–0.1)
EOS PCT: 8 %
Eosinophils Absolute: 0.8 10*3/uL — ABNORMAL HIGH (ref 0.0–0.7)
HEMATOCRIT: 38.3 % (ref 36.0–46.0)
Hemoglobin: 12.3 g/dL (ref 12.0–15.0)
Lymphocytes Relative: 27 %
Lymphs Abs: 2.5 10*3/uL (ref 0.7–4.0)
MCH: 27 pg (ref 26.0–34.0)
MCHC: 32.1 g/dL (ref 30.0–36.0)
MCV: 84 fL (ref 78.0–100.0)
MONO ABS: 0.9 10*3/uL (ref 0.1–1.0)
MONOS PCT: 10 %
NEUTROS ABS: 5.1 10*3/uL (ref 1.7–7.7)
Neutrophils Relative %: 55 %
PLATELETS: 162 10*3/uL (ref 150–400)
RBC: 4.56 MIL/uL (ref 3.87–5.11)
RDW: 24.3 % — AB (ref 11.5–15.5)
WBC: 9.2 10*3/uL (ref 4.0–10.5)

## 2015-12-23 LAB — BASIC METABOLIC PANEL
Anion gap: 10 (ref 5–15)
BUN: 11 mg/dL (ref 6–20)
CALCIUM: 9.2 mg/dL (ref 8.9–10.3)
CO2: 26 mmol/L (ref 22–32)
CREATININE: 0.65 mg/dL (ref 0.44–1.00)
Chloride: 106 mmol/L (ref 101–111)
GLUCOSE: 112 mg/dL — AB (ref 65–99)
Potassium: 3.8 mmol/L (ref 3.5–5.1)
Sodium: 142 mmol/L (ref 135–145)

## 2015-12-23 NOTE — Pre-Procedure Instructions (Signed)
Patient given information to sign up for my chart at home. 

## 2015-12-25 ENCOUNTER — Encounter (INDEPENDENT_AMBULATORY_CARE_PROVIDER_SITE_OTHER): Payer: Self-pay | Admitting: *Deleted

## 2016-01-21 NOTE — Patient Instructions (Signed)
Kathleen Valenzuela  01/21/2016     @PREFPERIOPPHARMACY @   Your procedure is scheduled on 01/29/2016.  Report to [facility] at 11:25 A.M.  Call this number if you have problems the morning of surgery:  [phone]   Remember:  Do not eat food or drink liquids after midnight.  Take these medicines the morning of surgery with A SIP OF WATER Reglan, Metoprolol, Singulair, Oxycodone if needed, Phenergan if needed, Zanaflex, Albuterol inhaler (bring with you), Xanax, Wellbutrin,  Bentyl, Aricept, Nexium, Mobic, Flonase, Veramyst, Duoneb, Lamictal, Lithium   Do not wear jewelry, make-up or nail polish.  Do not wear lotions, powders, or perfumes.  You may wear deodorant.  Do not shave 48 hours prior to surgery.  Men may shave face and neck.  Do not bring valuables to the hospital.  [facility] is not responsible for any belongings or valuables.  Contacts, dentures or bridgework may not be worn into surgery.  Leave your suitcase in the car.  After surgery it may be brought to your room.  For patients admitted to the hospital, discharge time will be determined by your treatment team.  Patients discharged the day of surgery will not be allowed to drive home.    Please read over the following fact sheets that you were given. Anesthesia Post-op Instructions     PATIENT INSTRUCTIONS POST-ANESTHESIA  IMMEDIATELY FOLLOWING SURGERY:  Do not drive or operate machinery for the first twenty four hours after surgery.  Do not make any important decisions for twenty four hours after surgery or while taking narcotic pain medications or sedatives.  If you develop intractable nausea and vomiting or a severe headache please notify your doctor immediately.  FOLLOW-UP:  Please make an appointment with your surgeon as instructed. You do not need to follow up with anesthesia unless specifically instructed to do so.  WOUND CARE INSTRUCTIONS (if applicable):  Keep a dry clean dressing on the  anesthesia/puncture wound site if there is drainage.  Once the wound has quit draining you may leave it open to air.  Generally you should leave the bandage intact for twenty four hours unless there is drainage.  If the epidural site drains for more than 36-48 hours please call the anesthesia department.  QUESTIONS?:  Please feel free to call your physician or the hospital operator if you have any questions, and they will be happy to assist you.      Esophageal Dilatation Esophageal dilatation is a procedure to open a blocked or narrowed part of the esophagus. The esophagus is the long tube in your throat that carries food and liquid from your mouth to your stomach. The procedure is also called esophageal dilation.  You may need this procedure if you have a buildup of scar tissue in your esophagus that makes it difficult, painful, or even impossible to swallow. This can be caused by gastroesophageal reflux disease (GERD). In rare cases, people need this procedure because they have cancer of the esophagus or a problem with the way food moves through the esophagus. Sometimes you may need to have another dilatation to enlarge the opening of the esophagus gradually. LET [facility] CARE PROVIDER KNOW ABOUT:   Any allergies you have.  All medicines you are taking, including vitamins, herbs, eye drops, creams, and over-the-counter medicines.  Previous problems you or members of your family have had with the use of anesthetics.  Any blood disorders you have.  Previous surgeries you have had.  Medical conditions you  have.  Any antibiotic medicines you are required to take before dental procedures. RISKS AND COMPLICATIONS Generally, this is a safe procedure. However, problems can occur and include:  Bleeding from a tear in the lining of the esophagus.  A hole (perforation) in the esophagus. BEFORE THE PROCEDURE  Do not eat or drink anything after midnight on the night before the procedure or as  directed by your health care provider.  Ask your health care provider about changing or stopping your regular medicines. This is especially important if you are taking diabetes medicines or blood thinners.  Plan to have someone take you home after the procedure. PROCEDURE   You will be given a medicine that makes you relaxed and sleepy (sedative).  A medicine may be sprayed or gargled to numb the back of the throat.  Your health care provider can use various instruments to do an esophageal dilatation. During the procedure, the instrument used will be placed in your mouth and passed down into your esophagus. Options include:  Simple dilators. This instrument is carefully placed in the esophagus to stretch it.  Guided wire bougies. In this method, a flexible tube (endoscope) is used to insert a wire into the esophagus. The dilator is passed over this wire to enlarge the esophagus. Then the wire is removed.  Balloon dilators. An endoscope with a small balloon at the end is passed down into the esophagus. Inflating the balloon gently stretches the esophagus and opens it up. AFTER THE PROCEDURE  Your blood pressure, heart rate, breathing rate, and blood oxygen level will be monitored often until the medicines you were given have worn off.  Your throat may feel slightly sore and will probably still feel numb. This will improve slowly over time.  You will not be allowed to eat or drink until the throat numbness has resolved.  If this is a same-day procedure, you may be allowed to go home once you have been able to drink, urinate, and sit on the edge of the bed without nausea or dizziness.  If this is a same-day procedure, you should have a friend or family member with you for the next 24 hours after the procedure.   This information is not intended to replace advice given to you by your health care provider. Make sure you discuss any questions you have with your health care provider.     Document Released: 10/13/2005 Document Revised: 09/12/2014 Document Reviewed: 01/01/2014 Elsevier Interactive Patient Education 2016 [employer]. Esophagogastroduodenoscopy Esophagogastroduodenoscopy (EGD) is a procedure that is used to examine the lining of the esophagus, stomach, and first part of the small intestine (duodenum). A long, flexible, lighted tube with a camera attached (endoscope) is inserted down the throat to view these organs. This procedure is done to detect problems or abnormalities, such as inflammation, bleeding, ulcers, or growths, in order to treat them. The procedure lasts 5-20 minutes. It is usually an outpatient procedure, but it may need to be performed in a hospital in emergency cases. LET [facility] CARE PROVIDER KNOW ABOUT:  Any allergies you have.  All medicines you are taking, including vitamins, herbs, eye drops, creams, and over-the-counter medicines.  Previous problems you or members of your family have had with the use of anesthetics.  Any blood disorders you have.  Previous surgeries you have had.  Medical conditions you have. RISKS AND COMPLICATIONS Generally, this is a safe procedure. However, problems can occur and include:  Infection.  Bleeding.  Tearing (perforation) of the  esophagus, stomach, or duodenum.  Difficulty breathing or not being able to breathe.  Excessive sweating.  Spasms of the larynx.  Slowed heartbeat.  Low blood pressure. BEFORE THE PROCEDURE  Do not eat or drink anything after midnight on the night before the procedure or as directed by your health care provider.  Do not take your regular medicines before the procedure if your health care provider asks you not to. Ask your health care provider about changing or stopping those medicines.  If you wear dentures, be prepared to remove them before the procedure.  Arrange for someone to drive you home after the procedure. PROCEDURE  A numbing medicine (local  anesthetic) may be sprayed in your throat for comfort and to stop you from gagging or coughing.  You will have an IV tube inserted in a vein in your [place] or arm. You will receive medicines and fluids through this tube.  You will be given a medicine to relax you (sedative).  A pain reliever will be given through the IV tube.  A mouth guard may be placed in your mouth to protect your teeth and to keep you from biting on the endoscope.  You will be asked to lie on your left side.  The endoscope will be inserted down your throat and into your esophagus, stomach, and duodenum.  Air will be put through the endoscope to allow your health care provider to clearly view the lining of your esophagus.  The lining of your esophagus, stomach, and duodenum will be examined. During the exam, your health care provider may:  Remove tissue to be examined under a microscope (biopsy) for inflammation, infection, or other medical problems.  Remove growths.  Remove objects (foreign bodies) that are stuck.  Treat any bleeding with medicines or other devices that stop tissues from bleeding (hot cautery, clipping devices).  Widen (dilate) or stretch narrowed areas of your esophagus and stomach.  The endoscope will be withdrawn. AFTER THE PROCEDURE  You will be taken to a recovery area for observation. Your blood pressure, heart rate, breathing rate, and blood oxygen level will be monitored often until the medicines you were given have worn off.  Do not eat or drink anything until the numbing medicine has worn off and your gag reflex has returned. You may choke.  Your health care provider should be able to discuss his or her findings with you. It will take longer to discuss the test results if any biopsies were taken.   This information is not intended to replace advice given to you by your health care provider. Make sure you discuss any questions you have with your health care provider.   Document  Released: 12/23/2004 Document Revised: 09/12/2014 Document Reviewed: 07/25/2012 Elsevier Interactive Patient Education [employer].

## 2016-01-25 ENCOUNTER — Encounter (HOSPITAL_COMMUNITY): Payer: Self-pay

## 2016-01-25 ENCOUNTER — Encounter (HOSPITAL_COMMUNITY)
Admission: RE | Admit: 2016-01-25 | Discharge: 2016-01-25 | Disposition: A | Discharge status: Home / Self Care | Payer: Medicare Other | Source: Ambulatory Visit | Attending: Internal Medicine | Admitting: Internal Medicine

## 2016-01-25 NOTE — Pre-Procedure Instructions (Addendum)
Patient in for PAT. Dr Patsey Berthold aware of labs drawn 4/19 and does not want them repeated. Called [facility] and spoke with Erline Levine, RN and gave her information of arrival time and meds to take am of procedure. Also sent written information with patient to give to staff. Also made [facility] aware of this. Erline Levine verbalized understanding of this. Patient is to come by [facility] for procedure. Instructed Erline Levine that patient can only take [facility] back to facility if she is accompanied by another person. She verbalized understanding of this as well.

## 2016-01-29 ENCOUNTER — Encounter (HOSPITAL_COMMUNITY)
Admission: RE | Disposition: A | Discharge status: Home / Self Care | Payer: Self-pay | Source: Ambulatory Visit | Attending: Internal Medicine

## 2016-01-29 ENCOUNTER — Encounter (HOSPITAL_COMMUNITY): Payer: Self-pay | Admitting: *Deleted

## 2016-01-29 ENCOUNTER — Ambulatory Visit (HOSPITAL_COMMUNITY): Payer: Medicare Other | Admitting: Anesthesiology

## 2016-01-29 ENCOUNTER — Ambulatory Visit (HOSPITAL_COMMUNITY)
Admission: RE | Admit: 2016-01-29 | Discharge: 2016-01-29 | Disposition: A | Discharge status: Home / Self Care | Payer: Medicare Other | Source: Ambulatory Visit | Attending: Internal Medicine | Admitting: Internal Medicine

## 2016-01-29 DIAGNOSIS — K222 Esophageal obstruction: Secondary | ICD-10-CM | POA: Diagnosis present

## 2016-01-29 DIAGNOSIS — K315 Obstruction of duodenum: Secondary | ICD-10-CM | POA: Insufficient documentation

## 2016-01-29 DIAGNOSIS — Z880 Allergy status to penicillin: Secondary | ICD-10-CM | POA: Insufficient documentation

## 2016-01-29 DIAGNOSIS — M1990 Unspecified osteoarthritis, unspecified site: Secondary | ICD-10-CM | POA: Insufficient documentation

## 2016-01-29 DIAGNOSIS — G8929 Other chronic pain: Secondary | ICD-10-CM | POA: Diagnosis not present

## 2016-01-29 DIAGNOSIS — F319 Bipolar disorder, unspecified: Secondary | ICD-10-CM | POA: Diagnosis not present

## 2016-01-29 DIAGNOSIS — E7800 Pure hypercholesterolemia, unspecified: Secondary | ICD-10-CM | POA: Insufficient documentation

## 2016-01-29 DIAGNOSIS — I10 Essential (primary) hypertension: Secondary | ICD-10-CM | POA: Insufficient documentation

## 2016-01-29 DIAGNOSIS — G300 Alzheimer's disease with early onset: Secondary | ICD-10-CM | POA: Diagnosis not present

## 2016-01-29 DIAGNOSIS — Z7951 Long term (current) use of inhaled steroids: Secondary | ICD-10-CM | POA: Diagnosis not present

## 2016-01-29 DIAGNOSIS — K766 Portal hypertension: Secondary | ICD-10-CM | POA: Diagnosis not present

## 2016-01-29 DIAGNOSIS — K219 Gastro-esophageal reflux disease without esophagitis: Secondary | ICD-10-CM | POA: Insufficient documentation

## 2016-01-29 DIAGNOSIS — K3189 Other diseases of stomach and duodenum: Secondary | ICD-10-CM | POA: Diagnosis not present

## 2016-01-29 DIAGNOSIS — Z87891 Personal history of nicotine dependence: Secondary | ICD-10-CM | POA: Diagnosis not present

## 2016-01-29 DIAGNOSIS — Z79899 Other long term (current) drug therapy: Secondary | ICD-10-CM | POA: Insufficient documentation

## 2016-01-29 DIAGNOSIS — R1319 Other dysphagia: Secondary | ICD-10-CM | POA: Insufficient documentation

## 2016-01-29 DIAGNOSIS — F0280 Dementia in other diseases classified elsewhere without behavioral disturbance: Secondary | ICD-10-CM | POA: Insufficient documentation

## 2016-01-29 DIAGNOSIS — Z79891 Long term (current) use of opiate analgesic: Secondary | ICD-10-CM | POA: Diagnosis not present

## 2016-01-29 DIAGNOSIS — R1314 Dysphagia, pharyngoesophageal phase: Secondary | ICD-10-CM | POA: Diagnosis not present

## 2016-01-29 DIAGNOSIS — J449 Chronic obstructive pulmonary disease, unspecified: Secondary | ICD-10-CM | POA: Insufficient documentation

## 2016-01-29 DIAGNOSIS — F419 Anxiety disorder, unspecified: Secondary | ICD-10-CM | POA: Insufficient documentation

## 2016-01-29 HISTORY — PX: ESOPHAGOGASTRODUODENOSCOPY (EGD) WITH PROPOFOL: SHX5813

## 2016-01-29 HISTORY — PX: ESOPHAGEAL DILATION: SHX303

## 2016-01-29 SURGERY — ESOPHAGOGASTRODUODENOSCOPY (EGD) WITH PROPOFOL
Anesthesia: Monitor Anesthesia Care

## 2016-01-29 MED ORDER — ONDANSETRON HCL 4 MG/2ML IJ SOLN: 4.0000 mg | Freq: Once | INTRAMUSCULAR | Status: DC | PRN

## 2016-01-29 MED ORDER — FENTANYL CITRATE (PF) 100 MCG/2ML IJ SOLN
25.0000 ug | INTRAMUSCULAR | Status: AC
  Administered 2016-01-29: 25 ug via INTRAVENOUS

## 2016-01-29 MED ORDER — MIDAZOLAM HCL 5 MG/5ML IJ SOLN
INTRAMUSCULAR | Status: DC | PRN
  Administered 2016-01-29: 2 mg via INTRAVENOUS

## 2016-01-29 MED ORDER — PROPOFOL 10 MG/ML IV BOLUS
INTRAVENOUS | Status: AC
  Filled 2016-01-29: qty 20

## 2016-01-29 MED ORDER — GLYCOPYRROLATE 0.2 MG/ML IJ SOLN
0.2000 mg | Freq: Once | INTRAMUSCULAR | Status: AC
  Administered 2016-01-29: 0.2 mg via INTRAVENOUS
  Filled 2016-01-29: qty 1

## 2016-01-29 MED ORDER — ONDANSETRON HCL 4 MG/2ML IJ SOLN
4.0000 mg | Freq: Once | INTRAMUSCULAR | Status: AC
  Administered 2016-01-29: 4 mg via INTRAVENOUS

## 2016-01-29 MED ORDER — LACTATED RINGERS IV SOLN
INTRAVENOUS | Status: DC
  Administered 2016-01-29: 11:00:00 via INTRAVENOUS

## 2016-01-29 MED ORDER — FENTANYL CITRATE (PF) 100 MCG/2ML IJ SOLN: 25.0000 ug | INTRAMUSCULAR | Status: DC | PRN

## 2016-01-29 MED ORDER — MIDAZOLAM HCL 2 MG/2ML IJ SOLN
INTRAMUSCULAR | Status: AC
  Filled 2016-01-29: qty 2

## 2016-01-29 MED ORDER — FENTANYL CITRATE (PF) 100 MCG/2ML IJ SOLN
INTRAMUSCULAR | Status: AC
  Filled 2016-01-29: qty 2

## 2016-01-29 MED ORDER — MIDAZOLAM HCL 2 MG/2ML IJ SOLN
1.0000 mg | INTRAMUSCULAR | Status: DC | PRN
  Administered 2016-01-29: 2 mg via INTRAVENOUS
  Filled 2016-01-29: qty 2

## 2016-01-29 MED ORDER — ONDANSETRON HCL 4 MG/2ML IJ SOLN
INTRAMUSCULAR | Status: AC
  Filled 2016-01-29: qty 2

## 2016-01-29 MED ORDER — PROPOFOL 500 MG/50ML IV EMUL
INTRAVENOUS | Status: DC | PRN
  Administered 2016-01-29: 12:00:00 via INTRAVENOUS
  Administered 2016-01-29: 125 ug/kg/min via INTRAVENOUS

## 2016-01-29 MED ORDER — BUTAMBEN-TETRACAINE-BENZOCAINE 2-2-14 % EX AERO
1.0000 | INHALATION_SPRAY | Freq: Two times a day (BID) | CUTANEOUS | Status: DC
  Administered 2016-01-29: 2 via TOPICAL

## 2016-01-29 NOTE — Op Note (Signed)
[facility] Patient Name: Kathleen Valenzuela Procedure Date: 01/29/2016 11:30 AM MRN: [id number] Date of Birth: [birth year] Attending MD: Hildred Laser , MD CSN: [id number] Age: 65 Admit Type: Outpatient Procedure:                Upper GI endoscopy Indications:              Esophageal dysphagia, Stricture of the esophagus,                            For therapy of esophageal stricture, Stenosis of                            the duodenum, For therapy of duodenal stenosis Providers:                Hildred Laser, MD, Gwenlyn Fudge, RN, Randa Spike, Technician Referring MD:             Celedonio Savage, MD Medicines:                Propofol per Anesthesia Complications:            No immediate complications. Estimated Blood Loss:     Estimated blood loss: 15 mL. Procedure:                Pre-Anesthesia Assessment:                           - Prior to the procedure, a History and Physical                            was performed, and patient medications and                            allergies were reviewed. The patient's tolerance of                            previous anesthesia was also reviewed. The risks                            and benefits of the procedure and the sedation                            options and risks were discussed with the patient.                            All questions were answered, and informed consent                            was obtained. Prior Anticoagulants: The patient                            last took previous NSAID medication 1 day prior to  the procedure. ASA Grade Assessment: III - A                            patient with severe systemic disease. After                            reviewing the risks and benefits, the patient was                            deemed in satisfactory condition to undergo the                            procedure.                           After obtaining informed consent,  the endoscope was                            passed under direct vision. Throughout the                            procedure, the patient's blood pressure, pulse, and                            oxygen saturations were monitored continuously. The                            [id number]) scope was introduced through the                            mouth, and advanced to the duodenal bulb. The upper                            GI endoscopy was accomplished without difficulty.                            The patient tolerated the procedure well. Scope In: 11:43:07 AM Scope Out: 12:00:49 PM Total Procedure Duration: 0 hours 17 minutes 42 seconds  Findings:      The upper third of the esophagus and middle third of the esophagus were       normal.      One moderate benign-appearing, intrinsic stenosis was found 32 to 35 cm       from the incisors. This measured 1 cm (inner diameter) x 3 cm (in       length) and was traversed. A TTS dilator was passed through the scope.       Dilation with a 15-16.5-18 mm balloon dilator was performed to 16.5 mm.       The dilation site was examined and showed moderate improvement in       luminal narrowing.      The Z-line was regular and was found 35 cm from the incisors.      Mild portal hypertensive gastropathy was found in the gastric fundus and       in the gastric body.      The exam of the stomach  was otherwise normal.      The duodenal bulb was normal.      An acquired benign-appearing, intrinsic mild stenosis was found in the       first portion of the duodenum and was non-traversed.      stricture located at angle of duodenum. It was dilated under direct       vision to 13.5 mm with balloon dilator. Impression:               - Normal upper third of esophagus and middle third                            of esophagus.                           - Benign-appearing esophageal stenosis. Dilated to                            16.5 mm with a balloon  dilator                           - Z-line regular, 35 cm from the incisors.                           - Portal hypertensive gastropathy.                           - Normal duodenal bulb.                           - Acquired duodenal stenosis dilated to 13.5 mm                            with a balloon dilator. Scope still could not be                            passed distally on account of large stomach and                            location of stricture.                           - No specimens collected. Moderate Sedation:      Per Anesthesia Care Recommendation:           - Patient has a contact number available for                            emergencies. The signs and symptoms of potential                            delayed complications were discussed with the                            patient. Return to normal activities tomorrow.  Written discharge instructions were provided to the                            patient.                           - Mechanical soft diet for 2 days.                           - Continue present medications.                           - No aspirin, ibuprofen, naproxen, or other                            non-steroidal anti-inflammatory drugs for 7 days.                           - Discontinue aspirin and NSAIDs for 7 days. Procedure Code(s):        --- Professional ---                           [phone], Esophagogastroduodenoscopy, flexible,                            transoral; with transendoscopic balloon dilation of                            esophagus (less than 30 mm diameter) Diagnosis Code(s):        --- Professional ---                           K22.2, Esophageal obstruction                           K76.6, Portal hypertension                           K31.89, Other diseases of stomach and duodenum                           K31.5, Obstruction of duodenum                           R13.14, Dysphagia, pharyngoesophageal  phase CPT copyright 2016 [employer]. All rights reserved. The codes documented in this report are preliminary and upon coder review may  be revised to meet current compliance requirements. Hildred Laser, MD Hildred Laser, MD 01/29/2016 12:18:27 PM This report has been signed electronically. Number of Addenda: 0

## 2016-01-29 NOTE — Anesthesia Preprocedure Evaluation (Signed)
Anesthesia Evaluation  Patient identified by MRN, date of birth, ID band Patient awake    Reviewed: Allergy & Precautions, NPO status , Patient's Chart, lab work & pertinent test results, reviewed documented beta blocker date and time   Airway Mallampati: II  TM Distance: >3 FB     Dental  (+) Edentulous Upper, Edentulous Lower   Pulmonary shortness of breath and with exertion, COPD, former smoker,    breath sounds clear to auscultation       Cardiovascular hypertension, Pt. on medications and Pt. on home beta blockers  Rhythm:Regular Rate:Normal     Neuro/Psych PSYCHIATRIC DISORDERS Anxiety Bipolar Disorder    GI/Hepatic GERD  Medicated,  Endo/Other    Renal/GU      Musculoskeletal   Abdominal   Peds  Hematology   Anesthesia Other Findings   Reproductive/Obstetrics                             Anesthesia Physical Anesthesia Plan  ASA: III  Anesthesia Plan: MAC   Post-op Pain Management:    Induction: Intravenous  Airway Management Planned: Simple Face Mask  Additional Equipment:   Intra-op Plan:   Post-operative Plan:   Informed Consent: I have reviewed the patients History and Physical, chart, labs and discussed the procedure including the risks, benefits and alternatives for the proposed anesthesia with the patient or authorized representative who has indicated his/her understanding and acceptance.     Plan Discussed with:   Anesthesia Plan Comments:         Anesthesia Quick Evaluation

## 2016-01-29 NOTE — Transfer of Care (Signed)
Immediate Anesthesia Transfer of Care Note  Patient: Kathleen Valenzuela  Procedure(s) Performed: Procedure(s) with comments: ESOPHAGOGASTRODUODENOSCOPY (EGD) WITH PROPOFOL (N/A) - 7:30 - moved to 4/21 @11 : 25 - Ann notified pt to arrive at 10:00 [place] (N/A)  Patient Location: PACU  Anesthesia Type:MAC  Level of Consciousness: awake and patient cooperative  Airway & Oxygen Therapy: Patient Spontanous Breathing and Patient connected to face mask oxygen  Post-op Assessment: Report given to RN, Post -op Vital signs reviewed and stable and Patient moving all extremities  Post vital signs: Reviewed and stable  Last Vitals:  Filed Vitals:   01/29/16 1110 01/29/16 1115  BP: 124/66 125/64  Temp:    Resp: 25 61    Last Pain: There were no vitals filed for this visit.    Patients Stated Pain Goal: 5 ([phone] [id number])  Complications: No apparent anesthesia complications

## 2016-01-29 NOTE — Discharge Instructions (Signed)
No aspirin or OTC NSAIDs for one week. Discontinue meloxicam. Resume other medications as before. Soft foods for 48 hours. No driving for 24 hours. Office visit in 8 weeks. Please call the office on Tuesday to make follow up appointment [phone]  Colonoscopy, Care After Refer to this sheet in the next few weeks. These instructions provide you with information on caring for yourself after your procedure. Your health care provider may also give you more specific instructions. Your treatment has been planned according to current medical practices, but problems sometimes occur. Call your health care provider if you have any problems or questions after your procedure. WHAT TO EXPECT AFTER THE PROCEDURE  After your procedure, it is typical to have the following:  A small amount of blood in your stool.  Moderate amounts of gas and mild abdominal cramping or bloating. HOME CARE INSTRUCTIONS  Do not drive, operate machinery, or sign important documents for 24 hours.  You may shower and resume your regular physical activities, but move at a slower pace for the first 24 hours.  Take frequent rest periods for the first 24 hours.  Walk around or put a warm pack on your abdomen to help reduce abdominal cramping and bloating.  Drink enough fluids to keep your urine clear or pale yellow.  You may resume your normal diet as instructed by your health care provider. Avoid heavy or fried foods that are hard to digest.  Avoid drinking alcohol for 24 hours or as instructed by your health care provider.  Only take over-the-counter or prescription medicines as directed by your health care provider.  If a tissue sample (biopsy) was taken during your procedure:  Do not take aspirin or blood thinners for 7 days, or as instructed by your health care provider.  Do not drink alcohol for 7 days, or as instructed by your health care provider.  Eat soft foods for the first 24 hours. SEEK MEDICAL CARE  IF: You have persistent spotting of blood in your stool 2-3 days after the procedure. SEEK IMMEDIATE MEDICAL CARE IF:  You have more than a small spotting of blood in your stool.  You pass large blood clots in your stool.  Your abdomen is swollen (distended).  You have nausea or vomiting.  You have a fever.  You have increasing abdominal pain that is not relieved with medicine.   This information is not intended to replace advice given to you by your health care provider. Make sure you discuss any questions you have with your health care provider.   Document Released: 04/05/2004 Document Revised: 06/12/2013 Document Reviewed: 04/29/2013 Elsevier Interactive Patient Education 2016 [employer]  PATIENT INSTRUCTIONS POST-ANESTHESIA  IMMEDIATELY FOLLOWING SURGERY:  Do not drive or operate machinery for the first twenty four hours after surgery.  Do not make any important decisions for twenty four hours after surgery or while taking narcotic pain medications or sedatives.  If you develop intractable nausea and vomiting or a severe headache please notify your doctor immediately.  FOLLOW-UP:  Please make an appointment with your surgeon as instructed. You do not need to follow up with anesthesia unless specifically instructed to do so.  WOUND CARE INSTRUCTIONS (if applicable):  Keep a dry clean dressing on the anesthesia/puncture wound site if there is drainage.  Once the wound has quit draining you may leave it open to air.  Generally you should leave the bandage intact for twenty four hours unless there is drainage.  If the epidural site drains for  more than 36-48 hours please call the anesthesia department.  QUESTIONS?:  Please feel free to call your physician or the hospital operator if you have any questions, and they will be happy to assist you.

## 2016-01-29 NOTE — H&P (Signed)
Kathleen Valenzuela is an 65 y.o. female.   Chief Complaint: Patient is here for EGD with esophageal and duodenal stricture dilation. HPI: Patient is 65 year old Caucasian female with multiple medical problems who underwent EGD in February this year for high-grade esophageal stricture. The stricture was only dilated from 12-13.5 mm. She noted significant but incomplete improvement in her dysphagia. She was also noted to have duodenal stricture which was not manipulated. She was scheduled to come back in 4 weeks could not do so on account of other medical problems. She is not having swallowing difficulty solids every day. She denies nausea vomiting melena or weight loss. She states heartburn is well controlled with therapy.  Past Medical History  Diagnosis Date  . Duodenal stricture   . High cholesterol   . Bipolar 1 disorder ([place])   . GERD (gastroesophageal reflux disease)   . Anxiety   . COPD (chronic obstructive pulmonary disease) ([place])   . Shortness of breath dyspnea   . Hypertension   . Chronic back pain   . Arthritis   . Anemia   . Early onset Alzheimer's dementia     Past Surgical History  Procedure Laterality Date  . Foot surgery Right     bunionectomy  . Colonoscopy    . Esophagogastroduodenoscopy N/A 10/29/2015    Procedure: ESOPHAGOGASTRODUODENOSCOPY (EGD);  Surgeon: Rogene Houston, MD;  Location: [facility];  Service: Endoscopy;  Laterality: N/A;  1200  . Esophageal dilation N/A 10/29/2015    Procedure: ESOPHAGEAL DILATION;  Surgeon: Rogene Houston, MD;  Location: [facility];  Service: Endoscopy;  Laterality: N/A;  . Hemorrhoid surgery    . Cesarean section      History reviewed. No pertinent family history. Social History:  reports that she quit smoking about 6 months ago. Her smoking use included Cigarettes. She has a 70 pack-year smoking history. She does not have any smokeless tobacco history on file. She reports that she does not drink alcohol or use illicit  drugs.  Allergies:  Allergies  Allergen Reactions  . Penicillins Rash    Medications Prior to Admission  Medication Sig Dispense Refill  . albuterol (PROVENTIL HFA;VENTOLIN HFA) 108 (90 BASE) MCG/ACT inhaler Inhale into the lungs every 6 (six) hours as needed for wheezing or shortness of breath.    . ALPRAZolam (XANAX) 1 MG tablet Take 1 mg by mouth 3 (three) times daily.    Marland Kitchen atorvastatin (LIPITOR) 10 MG tablet Take 10 mg by mouth daily.    Marland Kitchen buPROPion (WELLBUTRIN XL) 300 MG 24 hr tablet Take 300 mg by mouth daily.    Marland Kitchen dicyclomine (BENTYL) 20 MG tablet Take 20 mg by mouth 4 (four) times daily -  before meals and at bedtime.    . docusate sodium (COLACE) 100 MG capsule Take 100 mg by mouth 2 (two) times daily.    Marland Kitchen donepezil (ARICEPT) 5 MG tablet Take 5 mg by mouth at bedtime.    Marland Kitchen esomeprazole (NEXIUM) 40 MG capsule Take 40 mg by mouth daily at 12 noon.    . Ferrous Gluconate 324 (37.5 Fe) MG TABS Take 1 tablet by mouth 2 (two) times daily.    . fluticasone (FLONASE) 50 MCG/ACT nasal spray Place 1 spray into both nostrils daily.    . fluticasone (VERAMYST) 27.5 MCG/SPRAY nasal spray Place 2 sprays into the nose daily.    . Fluticasone Furoate-Vilanterol (BREO ELLIPTA) 200-25 MCG/INH AEPB Inhale 1 puff into the lungs daily.    Marland Kitchen  furosemide (LASIX) 40 MG tablet Take 40 mg by mouth.    Marland Kitchen ipratropium-albuterol (DUONEB) 0.5-2.5 (3) MG/3ML SOLN Take 3 mLs by nebulization every 8 (eight) hours as needed (shortness of breath).    . lamoTRIgine (LAMICTAL) 100 MG tablet Take 100 mg by mouth 2 (two) times daily.    Marland Kitchen lithium 300 MG tablet Take 300 mg by mouth daily.     . meloxicam (MOBIC) 15 MG tablet Take 15 mg by mouth daily.    . Menthol-Zinc Oxide 0.44-20.625 % OINT Apply 1 application topically daily as needed (for rash and redness).    . metoCLOPramide (REGLAN) 10 MG tablet Take 10 mg by mouth 4 (four) times daily -  before meals and at bedtime.    . metoprolol succinate (TOPROL-XL) 50 MG  24 hr tablet Take 50 mg by mouth daily. Take with or immediately following a meal.    . montelukast (SINGULAIR) 10 MG tablet Take 10 mg by mouth at bedtime.    . Olopatadine HCl (PATADAY) 0.2 % SOLN Apply to eye.    Marland Kitchen oxyCODONE (ROXICODONE) 15 MG immediate release tablet Take 15 mg by mouth every 4 (four) hours as needed for pain.    . pantoprazole (PROTONIX) 40 MG tablet Take 1 tablet (40 mg total) by mouth 2 (two) times daily before a meal. 60 tablet 5  . promethazine (PHENERGAN) 25 MG tablet Take 25 mg by mouth every 8 (eight) hours as needed for nausea or vomiting.    . tizanidine (ZANAFLEX) 2 MG capsule Take 2 mg by mouth 2 (two) times daily before a meal.    . traZODone (DESYREL) 50 MG tablet Take 50 mg by mouth at bedtime.      No results found for this or any previous visit (from the past 48 hour(s)). No results found.  ROS  Blood pressure 130/60, temperature 98.7 F (37.1 C), temperature source Oral, resp. rate 22, SpO2 97 %. Physical Exam  Constitutional: She appears well-developed and well-nourished.  HENT:  Patient is edentulous. She uses upper dentures.  Eyes: Conjunctivae are normal. No scleral icterus.  Neck: No thyromegaly present.  Cardiovascular: Normal rate, regular rhythm and normal heart sounds.   No murmur heard. Respiratory: Effort normal and breath sounds normal.  GI: Soft. She exhibits no distension and no mass. There is no tenderness.  Musculoskeletal: She exhibits no edema.  Lymphadenopathy:    She has no cervical adenopathy.  Neurological: She is alert.  Skin: Skin is warm and dry.     Assessment/Plan Solid food dysphagia secondary to known high-grade distal esophageal stricture. Duodenal stricture possibly secondary to remote peptic ulcer disease EGD with esophageal and duodenal stricture dilation under propofol.  Kathleen Laser, MD 01/29/2016, 11:13 AM

## 2016-01-29 NOTE — Anesthesia Postprocedure Evaluation (Signed)
Anesthesia Post Note  Patient: Kathleen Valenzuela  Procedure(s) Performed: Procedure(s) (LRB): ESOPHAGOGASTRODUODENOSCOPY (EGD) WITH PROPOFOL (N/A) ESOPHAGEAL DILATION (N/A)  Patient location during evaluation: PACU Anesthesia Type: MAC Level of consciousness: awake, oriented and patient cooperative Pain management: pain level controlled Vital Signs Assessment: post-procedure vital signs reviewed and stable Respiratory status: spontaneous breathing, nonlabored ventilation and respiratory function stable Cardiovascular status: blood pressure returned to baseline Postop Assessment: no signs of nausea or vomiting Anesthetic complications: no    Last Vitals:  Filed Vitals:   01/29/16 1110 01/29/16 1115  BP: 124/66 125/64  Temp:    Resp: 25 61    Last Pain: There were no vitals filed for this visit.               [name] J

## 2016-02-05 ENCOUNTER — Encounter (HOSPITAL_COMMUNITY): Payer: Self-pay | Admitting: Internal Medicine

## 2016-05-16 ENCOUNTER — Encounter (INDEPENDENT_AMBULATORY_CARE_PROVIDER_SITE_OTHER): Payer: Self-pay

## 2016-09-07 DIAGNOSIS — Z79899 Other long term (current) drug therapy: Secondary | ICD-10-CM | POA: Diagnosis not present

## 2016-09-07 DIAGNOSIS — M47816 Spondylosis without myelopathy or radiculopathy, lumbar region: Secondary | ICD-10-CM | POA: Diagnosis not present

## 2016-09-07 DIAGNOSIS — M48062 Spinal stenosis, lumbar region with neurogenic claudication: Secondary | ICD-10-CM | POA: Diagnosis not present

## 2016-09-07 DIAGNOSIS — Z79891 Long term (current) use of opiate analgesic: Secondary | ICD-10-CM | POA: Diagnosis not present

## 2016-09-07 DIAGNOSIS — G894 Chronic pain syndrome: Secondary | ICD-10-CM | POA: Diagnosis not present

## 2016-09-07 DIAGNOSIS — M5136 Other intervertebral disc degeneration, lumbar region: Secondary | ICD-10-CM | POA: Diagnosis not present

## 2016-09-08 DIAGNOSIS — F3132 Bipolar disorder, current episode depressed, moderate: Secondary | ICD-10-CM | POA: Diagnosis not present

## 2016-09-08 DIAGNOSIS — F314 Bipolar disorder, current episode depressed, severe, without psychotic features: Secondary | ICD-10-CM | POA: Diagnosis not present

## 2016-09-08 DIAGNOSIS — J449 Chronic obstructive pulmonary disease, unspecified: Secondary | ICD-10-CM | POA: Diagnosis not present

## 2016-09-15 DIAGNOSIS — F314 Bipolar disorder, current episode depressed, severe, without psychotic features: Secondary | ICD-10-CM | POA: Diagnosis not present

## 2016-09-22 DIAGNOSIS — F314 Bipolar disorder, current episode depressed, severe, without psychotic features: Secondary | ICD-10-CM | POA: Diagnosis not present

## 2016-09-28 DIAGNOSIS — M4696 Unspecified inflammatory spondylopathy, lumbar region: Secondary | ICD-10-CM | POA: Diagnosis not present

## 2016-09-28 DIAGNOSIS — M5137 Other intervertebral disc degeneration, lumbosacral region: Secondary | ICD-10-CM | POA: Diagnosis not present

## 2016-09-28 DIAGNOSIS — M47817 Spondylosis without myelopathy or radiculopathy, lumbosacral region: Secondary | ICD-10-CM | POA: Diagnosis not present

## 2016-09-29 DIAGNOSIS — F314 Bipolar disorder, current episode depressed, severe, without psychotic features: Secondary | ICD-10-CM | POA: Diagnosis not present

## 2016-10-11 DIAGNOSIS — H6981 Other specified disorders of Eustachian tube, right ear: Secondary | ICD-10-CM | POA: Diagnosis not present

## 2016-10-11 DIAGNOSIS — L304 Erythema intertrigo: Secondary | ICD-10-CM | POA: Diagnosis not present

## 2016-10-11 DIAGNOSIS — F17200 Nicotine dependence, unspecified, uncomplicated: Secondary | ICD-10-CM | POA: Diagnosis not present

## 2016-10-11 DIAGNOSIS — L02412 Cutaneous abscess of left axilla: Secondary | ICD-10-CM | POA: Diagnosis not present

## 2016-10-12 DIAGNOSIS — M25569 Pain in unspecified knee: Secondary | ICD-10-CM | POA: Diagnosis not present

## 2016-10-12 DIAGNOSIS — Z79891 Long term (current) use of opiate analgesic: Secondary | ICD-10-CM | POA: Diagnosis not present

## 2016-10-12 DIAGNOSIS — Z79899 Other long term (current) drug therapy: Secondary | ICD-10-CM | POA: Diagnosis not present

## 2016-10-12 DIAGNOSIS — M47816 Spondylosis without myelopathy or radiculopathy, lumbar region: Secondary | ICD-10-CM | POA: Diagnosis not present

## 2016-10-12 DIAGNOSIS — M5136 Other intervertebral disc degeneration, lumbar region: Secondary | ICD-10-CM | POA: Diagnosis not present

## 2016-10-12 DIAGNOSIS — G894 Chronic pain syndrome: Secondary | ICD-10-CM | POA: Diagnosis not present

## 2016-10-13 DIAGNOSIS — F314 Bipolar disorder, current episode depressed, severe, without psychotic features: Secondary | ICD-10-CM | POA: Diagnosis not present

## 2016-10-18 DIAGNOSIS — M5417 Radiculopathy, lumbosacral region: Secondary | ICD-10-CM | POA: Diagnosis not present

## 2016-10-20 DIAGNOSIS — F314 Bipolar disorder, current episode depressed, severe, without psychotic features: Secondary | ICD-10-CM | POA: Diagnosis not present

## 2016-10-31 DIAGNOSIS — M4696 Unspecified inflammatory spondylopathy, lumbar region: Secondary | ICD-10-CM | POA: Diagnosis not present

## 2016-10-31 DIAGNOSIS — M47817 Spondylosis without myelopathy or radiculopathy, lumbosacral region: Secondary | ICD-10-CM | POA: Diagnosis not present

## 2016-10-31 DIAGNOSIS — M5137 Other intervertebral disc degeneration, lumbosacral region: Secondary | ICD-10-CM | POA: Diagnosis not present

## 2016-10-31 DIAGNOSIS — M545 Low back pain: Secondary | ICD-10-CM | POA: Diagnosis not present

## 2016-11-03 DIAGNOSIS — F314 Bipolar disorder, current episode depressed, severe, without psychotic features: Secondary | ICD-10-CM | POA: Diagnosis not present

## 2016-11-09 DIAGNOSIS — Z79899 Other long term (current) drug therapy: Secondary | ICD-10-CM | POA: Diagnosis not present

## 2016-11-09 DIAGNOSIS — Z79891 Long term (current) use of opiate analgesic: Secondary | ICD-10-CM | POA: Diagnosis not present

## 2016-11-09 DIAGNOSIS — M5136 Other intervertebral disc degeneration, lumbar region: Secondary | ICD-10-CM | POA: Diagnosis not present

## 2016-11-09 DIAGNOSIS — G894 Chronic pain syndrome: Secondary | ICD-10-CM | POA: Diagnosis not present

## 2016-11-09 DIAGNOSIS — M47816 Spondylosis without myelopathy or radiculopathy, lumbar region: Secondary | ICD-10-CM | POA: Diagnosis not present

## 2016-11-11 DIAGNOSIS — R1314 Dysphagia, pharyngoesophageal phase: Secondary | ICD-10-CM | POA: Diagnosis not present

## 2016-11-11 DIAGNOSIS — K222 Esophageal obstruction: Secondary | ICD-10-CM | POA: Diagnosis not present

## 2016-11-16 ENCOUNTER — Encounter (INDEPENDENT_AMBULATORY_CARE_PROVIDER_SITE_OTHER): Payer: Self-pay | Admitting: Internal Medicine

## 2016-11-17 DIAGNOSIS — F3132 Bipolar disorder, current episode depressed, moderate: Secondary | ICD-10-CM | POA: Diagnosis not present

## 2016-11-22 ENCOUNTER — Ambulatory Visit (INDEPENDENT_AMBULATORY_CARE_PROVIDER_SITE_OTHER): Payer: Medicare Other | Admitting: Internal Medicine

## 2016-11-22 ENCOUNTER — Encounter (INDEPENDENT_AMBULATORY_CARE_PROVIDER_SITE_OTHER): Payer: Self-pay | Admitting: Internal Medicine

## 2016-11-30 DIAGNOSIS — M25569 Pain in unspecified knee: Secondary | ICD-10-CM | POA: Diagnosis not present

## 2016-11-30 DIAGNOSIS — M179 Osteoarthritis of knee, unspecified: Secondary | ICD-10-CM | POA: Diagnosis not present

## 2016-11-30 DIAGNOSIS — M25469 Effusion, unspecified knee: Secondary | ICD-10-CM | POA: Diagnosis not present

## 2016-12-01 ENCOUNTER — Encounter (INDEPENDENT_AMBULATORY_CARE_PROVIDER_SITE_OTHER): Payer: Self-pay

## 2016-12-01 ENCOUNTER — Encounter (INDEPENDENT_AMBULATORY_CARE_PROVIDER_SITE_OTHER): Payer: Self-pay | Admitting: *Deleted

## 2016-12-01 ENCOUNTER — Ambulatory Visit (INDEPENDENT_AMBULATORY_CARE_PROVIDER_SITE_OTHER): Payer: Medicare Other | Admitting: Internal Medicine

## 2016-12-01 ENCOUNTER — Encounter (INDEPENDENT_AMBULATORY_CARE_PROVIDER_SITE_OTHER): Payer: Self-pay | Admitting: Internal Medicine

## 2016-12-01 VITALS — BP 132/82 | HR 72 | Temp 98.6°F | Ht 62.0 in | Wt 198.1 lb

## 2016-12-01 DIAGNOSIS — R1319 Other dysphagia: Secondary | ICD-10-CM | POA: Insufficient documentation

## 2016-12-01 DIAGNOSIS — R1310 Dysphagia, unspecified: Secondary | ICD-10-CM | POA: Diagnosis not present

## 2016-12-01 NOTE — Patient Instructions (Signed)
EGD/ED. The risks and benefits such as perforation, bleeding, and infection were reviewed with the patient and is agreeable. 

## 2016-12-01 NOTE — Progress Notes (Signed)
Subjective:    Patient ID: Kathleen Valenzuela, female    DOB: [birth year], 66 y.o.   MRN: [id number]  HPI Referred by Dr. Wenda Overland for esophageal stricture. Hx of same. Last EGD/ED was in May of 2017. Revealed Impression:       - Normal upper third of esophagus and middle third                            of esophagus.                           - Benign-appearing esophageal stenosis. Dilated to                            16.5 mm with a balloon dilator                           - Z-line regular, 35 cm from the incisors.                           - Portal hypertensive gastropathy.                           - Normal duodenal bulb.                           - Acquired duodenal stenosis dilated to 13.5 mm                            with a balloon dilator. Scope still could not be                            passed distally on account of large stomach and                            location of stricture.                           - No specimens collected.     ON 10/29/2015 she underwent an EGD/ED Impression: High-grade strictured at distal esophagus proximal to GE junction.This stricture dilated from 12 to 13.5 mm with balloon dilator. Erosive reflux esophagitis. Small sliding hiatal hernia. Prepyloric gastric ulcer with antral erosions. Stricture noted at angle of duodenum. This stricture was not dilated today. Multiple biopsies taken from esophageal mucosa looking for eosions.  She tells me foods are lodging. She said symptoms started 3-4 months ago. Gradually worsened.  All foods are bothering her. She has a BM regular. No melena or BRRB Requesting an EGD/ED.  Chronic pain medication for DJD    09/24/2015 DG Esophagus:  IMPRESSION: Stricture of the distal thoracic esophagus several cm above the GE junction, obstructing a 12.5 mm diameter barium tablet. Review of Systems Past Medical History:  Diagnosis Date  . Anemia   . Anxiety   . Arthritis   . Bipolar 1 disorder ([place])   . Chronic  back pain   . COPD (chronic obstructive pulmonary disease) ([place])   . Duodenal stricture   . Early onset Alzheimer's dementia   .  GERD (gastroesophageal reflux disease)   . High cholesterol   . Hypertension   . Shortness of breath dyspnea     Past Surgical History:  Procedure Laterality Date  . CESAREAN SECTION    . COLONOSCOPY    . ESOPHAGEAL DILATION N/A 10/29/2015   Procedure: ESOPHAGEAL DILATION;  Surgeon: Rogene Houston, MD;  [facility];  Service: Endoscopy;  Laterality: N/A;  . ESOPHAGEAL DILATION N/A 01/29/2016   Procedure: ESOPHAGEAL DILATION;  Surgeon: Rogene Houston, MD;  [facility]: [facility];  Service: Endoscopy;  Laterality: N/A;  . ESOPHAGOGASTRODUODENOSCOPY N/A 10/29/2015   Procedure: ESOPHAGOGASTRODUODENOSCOPY (EGD);  Surgeon: Rogene Houston, MD;  [facility]: [facility];  Service: Endoscopy;  Laterality: N/A;  1200  . ESOPHAGOGASTRODUODENOSCOPY (EGD) WITH PROPOFOL N/A 01/29/2016   Procedure: ESOPHAGOGASTRODUODENOSCOPY (EGD) WITH PROPOFOL;  Surgeon: Rogene Houston, MD;  [facility];  Service: Endoscopy;  Laterality: N/A;  7:30 - moved to 4/21 @11 : 25 - Ann notified pt to arrive at 10:00  . FOOT SURGERY Right    bunionectomy  . HEMORRHOID SURGERY      Allergies  Allergen Reactions  . Penicillins Rash    Current Outpatient Prescriptions on File Prior to Visit  Medication Sig Dispense Refill  . albuterol (PROVENTIL HFA;VENTOLIN HFA) 108 (90 BASE) MCG/ACT inhaler Inhale into the lungs every 6 (six) hours as needed for wheezing or shortness of breath.    . ALPRAZolam (XANAX) 1 MG tablet Take 1 mg by mouth 3 (three) times daily.    Marland Kitchen atorvastatin (LIPITOR) 10 MG tablet Take 10 mg by mouth daily.    Marland Kitchen dicyclomine (BENTYL) 20 MG tablet Take 20 mg by mouth 4 (four) times daily -  before meals and at bedtime.    . docusate sodium (COLACE) 100 MG capsule Take 100 mg by mouth 2 (two) times daily.    Marland Kitchen donepezil (ARICEPT) 5 MG tablet Take 5 mg by  mouth at bedtime.    . Ferrous Gluconate 324 (37.5 Fe) MG TABS Take 1 tablet by mouth 2 (two) times daily.    . fluticasone (FLONASE) 50 MCG/ACT nasal spray Place 1 spray into both nostrils daily.    . Fluticasone Furoate-Vilanterol (BREO ELLIPTA) 200-25 MCG/INH AEPB Inhale 1 puff into the lungs daily.    . furosemide (LASIX) 40 MG tablet Take 40 mg by mouth.    Marland Kitchen ipratropium-albuterol (DUONEB) 0.5-2.5 (3) MG/3ML SOLN Take 3 mLs by nebulization every 8 (eight) hours as needed (shortness of breath).    . lamoTRIgine (LAMICTAL) 100 MG tablet Take 100 mg by mouth 2 (two) times daily.    Marland Kitchen lithium 300 MG tablet Take 300 mg by mouth daily.     . Menthol-Zinc Oxide 0.44-20.625 % OINT Apply 1 application topically daily as needed (for rash and redness).    . metoprolol succinate (TOPROL-XL) 50 MG 24 hr tablet Take 50 mg by mouth daily. Take with or immediately following a meal.    . Olopatadine HCl (PATADAY) 0.2 % SOLN Apply to eye.    Marland Kitchen oxyCODONE (ROXICODONE) 15 MG immediate release tablet Take 10 mg by mouth every 4 (four) hours as needed for pain.     . promethazine (PHENERGAN) 25 MG tablet Take 25 mg by mouth every 8 (eight) hours as needed for nausea or vomiting.    . tizanidine (ZANAFLEX) 2 MG capsule Take 2 mg by mouth 2 (two) times daily before a meal.    . traZODone (DESYREL) 50 MG  tablet Take 50 mg by mouth at bedtime.    . montelukast (SINGULAIR) 10 MG tablet Take 10 mg by mouth at bedtime.     No current facility-administered medications on file prior to visit.        Objective:   Physical Exam Blood pressure 132/82, pulse 72, temperature 98.6 F (37 C), height 5\' 2"  (1.575 m), weight 198 lb 1.6 oz (89.9 kg).  Alert and oriented. Skin warm and dry. Oral mucosa is moist.   . Sclera anicteric, conjunctivae is pink. Thyroid not enlarged. No cervical lymphadenopathy. Lungs clear. Heart regular rate and rhythm.  Abdomen is soft. Bowel sounds are positive. No hepatomegaly. No abdominal masses  felt. No tenderness.  No edema to lower extremities.         Assessment & Plan:  Dysphagia . Hx of esophageal. EGD/ED with propofol.  The risks and benefits such as perforation, bleeding, and infection were reviewed with the patient and is agreeable.

## 2016-12-07 DIAGNOSIS — Z5181 Encounter for therapeutic drug level monitoring: Secondary | ICD-10-CM | POA: Diagnosis not present

## 2016-12-08 DIAGNOSIS — F314 Bipolar disorder, current episode depressed, severe, without psychotic features: Secondary | ICD-10-CM | POA: Diagnosis not present

## 2016-12-14 DIAGNOSIS — J069 Acute upper respiratory infection, unspecified: Secondary | ICD-10-CM | POA: Diagnosis not present

## 2016-12-15 DIAGNOSIS — F3132 Bipolar disorder, current episode depressed, moderate: Secondary | ICD-10-CM | POA: Diagnosis not present

## 2016-12-15 DIAGNOSIS — F314 Bipolar disorder, current episode depressed, severe, without psychotic features: Secondary | ICD-10-CM | POA: Diagnosis not present

## 2016-12-16 DIAGNOSIS — M5136 Other intervertebral disc degeneration, lumbar region: Secondary | ICD-10-CM | POA: Diagnosis not present

## 2016-12-16 DIAGNOSIS — M25569 Pain in unspecified knee: Secondary | ICD-10-CM | POA: Diagnosis not present

## 2016-12-16 DIAGNOSIS — M419 Scoliosis, unspecified: Secondary | ICD-10-CM | POA: Diagnosis not present

## 2016-12-16 DIAGNOSIS — G894 Chronic pain syndrome: Secondary | ICD-10-CM | POA: Diagnosis not present

## 2016-12-22 DIAGNOSIS — F314 Bipolar disorder, current episode depressed, severe, without psychotic features: Secondary | ICD-10-CM | POA: Diagnosis not present

## 2016-12-29 DIAGNOSIS — F314 Bipolar disorder, current episode depressed, severe, without psychotic features: Secondary | ICD-10-CM | POA: Diagnosis not present

## 2016-12-30 NOTE — Patient Instructions (Signed)
Kathleen Valenzuela  12/30/2016     @PREFPERIOPPHARMACY @   Your procedure is scheduled on 01/06/2017.  Report to [facility] at 10:00 A.M.  Call this number if you have problems the morning of surgery:  [phone]   Remember:  Do not eat food or drink liquids after midnight.  Take these medicines the morning of surgery with A SIP OF WATER : Xanax, Imdur, Lithium, Lamictal, Toprol, Singulair and Oxycodone.  Please use your Albuterol Inhaler before coming to the hospital and bring it with you.   Do not wear jewelry, make-up or nail polish.  Do not wear lotions, powders, or perfumes, or deoderant.  Do not shave 48 hours prior to surgery.  Men may shave face and neck.  Do not bring valuables to the hospital.  [facility] is not responsible for any belongings or valuables.  Contacts, dentures or bridgework may not be worn into surgery.  Leave your suitcase in the car.  After surgery it may be brought to your room.  For patients admitted to the hospital, discharge time will be determined by your treatment team.  Patients discharged the day of surgery will not be allowed to drive home.   Name and phone number of your driver:  Special instructions:  family  Please read over the following fact sheets that you were given. Care and Recovery After Surgery    Esophagogastroduodenoscopy Esophagogastroduodenoscopy (EGD) is a procedure to examine the lining of the esophagus, stomach, and first part of the small intestine (duodenum). This procedure is done to check for problems such as inflammation, bleeding, ulcers, or growths. During this procedure, a long, flexible, lighted tube with a camera attached (endoscope) is inserted down the throat. Tell a health care provider about:  Any allergies you have.  All medicines you are taking, including vitamins, herbs, eye drops, creams, and over-the-counter medicines.  Any problems you or family members have had with anesthetic medicines.  Any blood  disorders you have.  Any surgeries you have had.  Any medical conditions you have.  Whether you are pregnant or may be pregnant. What are the risks? Generally, this is a safe procedure. However, problems may occur, including:  Infection.  Bleeding.  A tear (perforation) in the esophagus, stomach, or duodenum.  Trouble breathing.  Excessive sweating.  Spasms of the larynx.  A slowed heartbeat.  Low blood pressure. What happens before the procedure?  Follow instructions from your health care provider about eating or drinking restrictions.  Ask your health care provider about:  Changing or stopping your regular medicines. This is especially important if you are taking diabetes medicines or blood thinners.  Taking medicines such as aspirin and ibuprofen. These medicines can thin your blood. Do not take these medicines before your procedure if your health care provider instructs you not to.  Plan to have someone take you home after the procedure.  If you wear dentures, be ready to remove them before the procedure. What happens during the procedure?  To reduce your risk of infection, your health care team will wash or sanitize their hands.  An IV tube will be put in a vein in your [place] or arm. You will get medicines and fluids through this tube.  You will be given one or more of the following:  A medicine to help you relax (sedative).  A medicine to numb the area (local anesthetic). This medicine may be sprayed into your throat. It will make you feel more comfortable and keep  you from gagging or coughing during the procedure.  A medicine for pain.  A mouth guard may be placed in your mouth to protect your teeth and to keep you from biting on the endoscope.  You will be asked to lie on your left side.  The endoscope will be lowered down your throat into your esophagus, stomach, and duodenum.  Air will be put into the endoscope. This will help your health care  provider see better.  The lining of your esophagus, stomach, and duodenum will be examined.  Your health care provider may:  Take a tissue sample so it can be looked at in a lab (biopsy).  Remove growths.  Remove objects (foreign bodies) that are stuck.  Treat any bleeding with medicines or other devices that stop tissue from bleeding.  Widen (dilate) or stretch narrowed areas of your esophagus and stomach.  The endoscope will be taken out. The procedure may vary among health care providers and hospitals. What happens after the procedure?  Your blood pressure, heart rate, breathing rate, and blood oxygen level will be monitored often until the medicines you were given have worn off.  Do not eat or drink anything until the numbing medicine has worn off and your gag reflex has returned. This information is not intended to replace advice given to you by your health care provider. Make sure you discuss any questions you have with your health care provider. Document Released: 12/23/2004 Document Revised: 01/28/2016 Document Reviewed: 07/16/2015 Elsevier Interactive Patient Education  2017 [employer].

## 2017-01-02 ENCOUNTER — Encounter (HOSPITAL_COMMUNITY): Payer: Self-pay

## 2017-01-02 ENCOUNTER — Encounter (HOSPITAL_COMMUNITY)
Admission: RE | Admit: 2017-01-02 | Discharge: 2017-01-02 | Disposition: A | Discharge status: Home / Self Care | Payer: Medicare Other | Source: Ambulatory Visit | Attending: Internal Medicine | Admitting: Internal Medicine

## 2017-01-02 DIAGNOSIS — M545 Low back pain: Secondary | ICD-10-CM | POA: Insufficient documentation

## 2017-01-02 DIAGNOSIS — I10 Essential (primary) hypertension: Secondary | ICD-10-CM | POA: Insufficient documentation

## 2017-01-02 DIAGNOSIS — R011 Cardiac murmur, unspecified: Secondary | ICD-10-CM | POA: Insufficient documentation

## 2017-01-02 DIAGNOSIS — G8929 Other chronic pain: Secondary | ICD-10-CM | POA: Diagnosis not present

## 2017-01-02 DIAGNOSIS — J449 Chronic obstructive pulmonary disease, unspecified: Secondary | ICD-10-CM | POA: Diagnosis not present

## 2017-01-02 DIAGNOSIS — E7800 Pure hypercholesterolemia, unspecified: Secondary | ICD-10-CM | POA: Insufficient documentation

## 2017-01-02 DIAGNOSIS — I447 Left bundle-branch block, unspecified: Secondary | ICD-10-CM | POA: Insufficient documentation

## 2017-01-02 DIAGNOSIS — K219 Gastro-esophageal reflux disease without esophagitis: Secondary | ICD-10-CM | POA: Insufficient documentation

## 2017-01-02 DIAGNOSIS — D649 Anemia, unspecified: Secondary | ICD-10-CM | POA: Diagnosis not present

## 2017-01-02 DIAGNOSIS — Z01810 Encounter for preprocedural cardiovascular examination: Secondary | ICD-10-CM | POA: Insufficient documentation

## 2017-01-02 DIAGNOSIS — F319 Bipolar disorder, unspecified: Secondary | ICD-10-CM | POA: Insufficient documentation

## 2017-01-02 DIAGNOSIS — F419 Anxiety disorder, unspecified: Secondary | ICD-10-CM | POA: Diagnosis not present

## 2017-01-02 DIAGNOSIS — R1310 Dysphagia, unspecified: Secondary | ICD-10-CM

## 2017-01-02 DIAGNOSIS — Z01812 Encounter for preprocedural laboratory examination: Secondary | ICD-10-CM | POA: Diagnosis not present

## 2017-01-02 DIAGNOSIS — R1319 Other dysphagia: Secondary | ICD-10-CM

## 2017-01-02 HISTORY — DX: Cardiac murmur, unspecified: R01.1

## 2017-01-02 LAB — BASIC METABOLIC PANEL WITH GFR
Anion gap: 8 (ref 5–15)
BUN: 14 mg/dL (ref 6–20)
CO2: 28 mmol/L (ref 22–32)
Calcium: 9 mg/dL (ref 8.9–10.3)
Chloride: 103 mmol/L (ref 101–111)
Creatinine, Ser: 0.82 mg/dL (ref 0.44–1.00)
GFR calc Af Amer: >60 mL/min
GFR calc non Af Amer: >60 mL/min
Glucose, Bld: 85 mg/dL (ref 65–99)
Potassium: 3.5 mmol/L (ref 3.5–5.1)
Sodium: 139 mmol/L (ref 135–145)

## 2017-01-02 LAB — CBC WITH DIFFERENTIAL/PLATELET
Basophils Absolute: 0 K/uL (ref 0.0–0.1)
Basophils Relative: 0 %
Eosinophils Absolute: 0.5 K/uL (ref 0.0–0.7)
Eosinophils Relative: 4 %
HCT: 39.4 % (ref 36.0–46.0)
Hemoglobin: 13.4 g/dL (ref 12.0–15.0)
Lymphocytes Relative: 21 %
Lymphs Abs: 2.8 K/uL (ref 0.7–4.0)
MCH: 32.1 pg (ref 26.0–34.0)
MCHC: 34 g/dL (ref 30.0–36.0)
MCV: 94.3 fL (ref 78.0–100.0)
Monocytes Absolute: 0.9 K/uL (ref 0.1–1.0)
Monocytes Relative: 7 %
Neutro Abs: 9.3 K/uL — ABNORMAL HIGH (ref 1.7–7.7)
Neutrophils Relative %: 68 %
Platelets: 237 K/uL (ref 150–400)
RBC: 4.18 MIL/uL (ref 3.87–5.11)
RDW: 13.9 % (ref 11.5–15.5)
WBC: 13.5 K/uL — ABNORMAL HIGH (ref 4.0–10.5)

## 2017-01-02 NOTE — Progress Notes (Signed)
   01/02/17 1112  OBSTRUCTIVE SLEEP APNEA  Have you ever been diagnosed with sleep apnea through a sleep study? No  Do you snore loudly (loud enough to be heard through closed doors)?  1  Do you often feel tired, fatigued, or sleepy during the daytime (such as falling asleep during driving or talking to someone)? 1  Has anyone observed you stop breathing during your sleep? 1  Do you have, or are you being treated for high blood pressure? 1  BMI more than 35 kg/m2? 1  Age > 50 (1-yes) 1  Neck circumference greater than:Female 16 inches or larger, Female 17inches or larger? 0  Female Gender (Yes=1) 0  Obstructive Sleep Apnea Score 6  Score 5 or greater  Results sent to PCP

## 2017-01-05 DIAGNOSIS — F3132 Bipolar disorder, current episode depressed, moderate: Secondary | ICD-10-CM | POA: Diagnosis not present

## 2017-01-06 ENCOUNTER — Ambulatory Visit (HOSPITAL_COMMUNITY): Payer: Medicare Other | Admitting: Anesthesiology

## 2017-01-06 ENCOUNTER — Ambulatory Visit (HOSPITAL_COMMUNITY)
Admission: RE | Admit: 2017-01-06 | Discharge: 2017-01-06 | Disposition: A | Discharge status: Home / Self Care | Payer: Medicare Other | Source: Ambulatory Visit | Attending: Internal Medicine | Admitting: Internal Medicine

## 2017-01-06 ENCOUNTER — Encounter (HOSPITAL_COMMUNITY): Payer: Self-pay | Admitting: *Deleted

## 2017-01-06 ENCOUNTER — Encounter (HOSPITAL_COMMUNITY)
Admission: RE | Disposition: A | Discharge status: Home / Self Care | Payer: Self-pay | Source: Ambulatory Visit | Attending: Internal Medicine

## 2017-01-06 DIAGNOSIS — J449 Chronic obstructive pulmonary disease, unspecified: Secondary | ICD-10-CM | POA: Diagnosis not present

## 2017-01-06 DIAGNOSIS — F419 Anxiety disorder, unspecified: Secondary | ICD-10-CM | POA: Insufficient documentation

## 2017-01-06 DIAGNOSIS — E7800 Pure hypercholesterolemia, unspecified: Secondary | ICD-10-CM | POA: Insufficient documentation

## 2017-01-06 DIAGNOSIS — G8929 Other chronic pain: Secondary | ICD-10-CM | POA: Insufficient documentation

## 2017-01-06 DIAGNOSIS — F17210 Nicotine dependence, cigarettes, uncomplicated: Secondary | ICD-10-CM | POA: Insufficient documentation

## 2017-01-06 DIAGNOSIS — K210 Gastro-esophageal reflux disease with esophagitis: Secondary | ICD-10-CM | POA: Diagnosis not present

## 2017-01-06 DIAGNOSIS — Z880 Allergy status to penicillin: Secondary | ICD-10-CM | POA: Diagnosis not present

## 2017-01-06 DIAGNOSIS — K219 Gastro-esophageal reflux disease without esophagitis: Secondary | ICD-10-CM | POA: Insufficient documentation

## 2017-01-06 DIAGNOSIS — K2970 Gastritis, unspecified, without bleeding: Secondary | ICD-10-CM | POA: Diagnosis not present

## 2017-01-06 DIAGNOSIS — D649 Anemia, unspecified: Secondary | ICD-10-CM | POA: Insufficient documentation

## 2017-01-06 DIAGNOSIS — I10 Essential (primary) hypertension: Secondary | ICD-10-CM | POA: Insufficient documentation

## 2017-01-06 DIAGNOSIS — R0600 Dyspnea, unspecified: Secondary | ICD-10-CM | POA: Insufficient documentation

## 2017-01-06 DIAGNOSIS — Z7951 Long term (current) use of inhaled steroids: Secondary | ICD-10-CM | POA: Insufficient documentation

## 2017-01-06 DIAGNOSIS — G300 Alzheimer's disease with early onset: Secondary | ICD-10-CM | POA: Insufficient documentation

## 2017-01-06 DIAGNOSIS — K449 Diaphragmatic hernia without obstruction or gangrene: Secondary | ICD-10-CM | POA: Diagnosis not present

## 2017-01-06 DIAGNOSIS — F0280 Dementia in other diseases classified elsewhere without behavioral disturbance: Secondary | ICD-10-CM | POA: Insufficient documentation

## 2017-01-06 DIAGNOSIS — R011 Cardiac murmur, unspecified: Secondary | ICD-10-CM | POA: Insufficient documentation

## 2017-01-06 DIAGNOSIS — K259 Gastric ulcer, unspecified as acute or chronic, without hemorrhage or perforation: Secondary | ICD-10-CM | POA: Diagnosis not present

## 2017-01-06 DIAGNOSIS — M549 Dorsalgia, unspecified: Secondary | ICD-10-CM | POA: Diagnosis not present

## 2017-01-06 DIAGNOSIS — R1310 Dysphagia, unspecified: Secondary | ICD-10-CM | POA: Diagnosis not present

## 2017-01-06 DIAGNOSIS — K222 Esophageal obstruction: Secondary | ICD-10-CM | POA: Insufficient documentation

## 2017-01-06 DIAGNOSIS — R1314 Dysphagia, pharyngoesophageal phase: Secondary | ICD-10-CM | POA: Insufficient documentation

## 2017-01-06 DIAGNOSIS — R1319 Other dysphagia: Secondary | ICD-10-CM

## 2017-01-06 DIAGNOSIS — Z79899 Other long term (current) drug therapy: Secondary | ICD-10-CM | POA: Diagnosis not present

## 2017-01-06 DIAGNOSIS — F319 Bipolar disorder, unspecified: Secondary | ICD-10-CM | POA: Insufficient documentation

## 2017-01-06 HISTORY — PX: BIOPSY: SHX5522

## 2017-01-06 HISTORY — PX: ESOPHAGEAL DILATION: SHX303

## 2017-01-06 HISTORY — PX: ESOPHAGOGASTRODUODENOSCOPY (EGD) WITH PROPOFOL: SHX5813

## 2017-01-06 SURGERY — ESOPHAGOGASTRODUODENOSCOPY (EGD) WITH PROPOFOL
Anesthesia: Monitor Anesthesia Care

## 2017-01-06 MED ORDER — MIDAZOLAM HCL 2 MG/2ML IJ SOLN
INTRAMUSCULAR | Status: AC
  Filled 2017-01-06: qty 2

## 2017-01-06 MED ORDER — MIDAZOLAM HCL 2 MG/2ML IJ SOLN
1.0000 mg | INTRAMUSCULAR | Status: AC
  Administered 2017-01-06: 2 mg via INTRAVENOUS
  Filled 2017-01-06: qty 2

## 2017-01-06 MED ORDER — LIDOCAINE VISCOUS 2 % MT SOLN
5.0000 mL | Freq: Two times a day (BID) | OROMUCOSAL | Status: DC
  Administered 2017-01-06: 5 mL via OROMUCOSAL

## 2017-01-06 MED ORDER — FENTANYL CITRATE (PF) 100 MCG/2ML IJ SOLN
25.0000 ug | Freq: Once | INTRAMUSCULAR | Status: AC
  Administered 2017-01-06: 25 ug via INTRAVENOUS

## 2017-01-06 MED ORDER — CHLORHEXIDINE GLUCONATE CLOTH 2 % EX PADS: 6.0000 | MEDICATED_PAD | Freq: Once | CUTANEOUS | Status: DC

## 2017-01-06 MED ORDER — LIDOCAINE VISCOUS 2 % MT SOLN
OROMUCOSAL | Status: AC
  Filled 2017-01-06: qty 15

## 2017-01-06 MED ORDER — PROPOFOL 500 MG/50ML IV EMUL
INTRAVENOUS | Status: DC | PRN
  Administered 2017-01-06: 125 ug/kg/min via INTRAVENOUS

## 2017-01-06 MED ORDER — LACTATED RINGERS IV SOLN
INTRAVENOUS | Status: DC
  Administered 2017-01-06: 11:00:00 via INTRAVENOUS

## 2017-01-06 MED ORDER — FENTANYL CITRATE (PF) 100 MCG/2ML IJ SOLN
INTRAMUSCULAR | Status: AC
  Filled 2017-01-06: qty 2

## 2017-01-06 NOTE — Progress Notes (Signed)
Awake. Talking. Wants to see family. Coke given to drink. Tolerated well. Denies pain.

## 2017-01-06 NOTE — Anesthesia Preprocedure Evaluation (Signed)
Anesthesia Evaluation  Patient identified by MRN, date of birth, ID band Patient awake    Reviewed: Allergy & Precautions, NPO status , Unable to perform ROS - Chart review only  Airway Mallampati: III  TM Distance: >3 FB Neck ROM: Full    Dental  (+) Edentulous Upper, Edentulous Lower   Pulmonary shortness of breath, COPD, former smoker,    breath sounds clear to auscultation       Cardiovascular hypertension, Pt. on medications  Rhythm:Regular Rate:Normal     Neuro/Psych PSYCHIATRIC DISORDERS Anxiety    GI/Hepatic GERD  ,  Endo/Other    Renal/GU      Musculoskeletal   Abdominal   Peds  Hematology   Anesthesia Other Findings   Reproductive/Obstetrics                             Anesthesia Physical Anesthesia Plan  ASA: III  Anesthesia Plan: MAC   Post-op Pain Management:    Induction: Intravenous  Airway Management Planned: Simple Face Mask  Additional Equipment:   Intra-op Plan:   Post-operative Plan:   Informed Consent: I have reviewed the patients History and Physical, chart, labs and discussed the procedure including the risks, benefits and alternatives for the proposed anesthesia with the patient or authorized representative who has indicated his/her understanding and acceptance.     Plan Discussed with:   Anesthesia Plan Comments:         Anesthesia Quick Evaluation

## 2017-01-06 NOTE — Op Note (Signed)
[facility] Patient Name: Kathleen Valenzuela Procedure Date: 01/06/2017 10:57 AM MRN: [id number] Date of Birth: 05-04-1951 Attending MD: Hildred Laser , MD CSN: [id number] Age: 66 Admit Type: Outpatient Procedure:                Upper GI endoscopy Indications:              Esophageal dysphagia, For therapy of esophageal                            stenosis Providers:                Hildred Laser, MD, Otis Peak B. Sharon Seller, RN, Aram Candela Referring MD:             Celedonio Savage, MD Medicines:                Lidocaine spray, Propofol per Anesthesia Complications:            No immediate complications. Estimated Blood Loss:     Estimated blood loss was minimal. Procedure:                Pre-Anesthesia Assessment:                           - Prior to the procedure, a History and Physical                            was performed, and patient medications and                            allergies were reviewed. The patient's tolerance of                            previous anesthesia was also reviewed. The risks                            and benefits of the procedure and the sedation                            options and risks were discussed with the patient.                            All questions were answered, and informed consent                            was obtained. Prior Anticoagulants: The patient                            last took ibuprofen on the day of the procedure.                            ASA Grade Assessment: III - A patient with severe  systemic disease. After reviewing the risks and                            benefits, the patient was deemed in satisfactory                            condition to undergo the procedure.                           After obtaining informed consent, the endoscope was                            passed under direct vision. Throughout the                            procedure, the patient's blood  pressure, pulse, and                            oxygen saturations were monitored continuously. The                            EG-[id number] ([id number]) scope was introduced through the                            and advanced to the second part of duodenum. The                            upper GI endoscopy was technically difficult and                            complex due to narrowing. The patient tolerated the                            procedure well. Scope In: 11:23:28 AM Scope Out: 11:38:43 AM Total Procedure Duration: 0 hours 15 minutes 15 seconds  Findings:      The mid esophagus and distal esophagus were normal.      One severe benign-appearing, intrinsic stenosis was found 34 to 36 cm       from the incisors. This measured 8 mm (inner diameter) and was traversed       after dilation. A TTS dilator was passed through the scope. Dilation       with a 15-16.5-18 mm balloon dilator was performed to 15 mm. The       dilation site was examined and showed moderate improvement in luminal       narrowing. mucosal disruption noted at end just above GE junction.      The Z-line was regular and was found 36 cm from the incisors.      A 4 cm hiatal hernia was present. This was biopsied with a cold forceps       for histology.      One non-bleeding superficial gastric ulcer was found in the gastric       antrum. The lesion was 4 mm in largest dimension.      One non-bleeding superficial gastric ulcer was found at the pylorus. The  lesion was 3 mm in largest dimension.      Patchy mild inflammation characterized by congestion (edema), erythema       and linear erosions was found in the gastric body and in the gastric       antrum.      The duodenal bulb and second portion of the duodenum were normal. Impression:               - Normal mid esophagus and distal esophagus.                           - Benign-appearing esophageal stenosis. Dilated.                           - Z-line regular, 36 cm  from the incisors.                           - 4 cm hiatal hernia. Biopsied.                           - Non-bleeding gastric ulcer.                           - Non-bleeding gastric ulcer.                           - Gastritis.                           - Normal duodenal bulb and second portion of the                            duodenum. Moderate Sedation:      Per Anesthesia Care Recommendation:           - Patient has a contact number available for                            emergencies. The signs and symptoms of potential                            delayed complications were discussed with the                            patient. Return to normal activities tomorrow.                            Written discharge instructions were provided to the                            patient.                           - Mechanical soft diet for 2 days.                           - Continue present medications.                           -  No aspirin, ibuprofen, naproxen, or other                            non-steroidal anti-inflammatory drugs for 3 days.                           - Await pathology results.                           - Repeat upper endoscopy in 4 weeks. Procedure Code(s):        --- Professional ---                           [phone], Esophagogastroduodenoscopy, flexible,                            transoral; with transendoscopic balloon dilation of                            esophagus (less than 30 mm diameter)                           [id number], Esophagogastroduodenoscopy, flexible,                            transoral; with biopsy, single or multiple Diagnosis Code(s):        --- Professional ---                           K22.2, Esophageal obstruction                           K44.9, Diaphragmatic hernia without obstruction or                            gangrene                           K25.9, Gastric ulcer, unspecified as acute or                            chronic, without  hemorrhage or perforation                           K29.70, Gastritis, unspecified, without bleeding                           R13.14, Dysphagia, pharyngoesophageal phase CPT copyright 2016 American Medical Association. All rights reserved. The codes documented in this report are preliminary and upon coder review may  be revised to meet current compliance requirements. Hildred Laser, MD Hildred Laser, MD 01/06/2017 11:52:37 AM This report has been signed electronically. Number of Addenda: 0

## 2017-01-06 NOTE — Discharge Instructions (Signed)
No aspirin or NSAIDs for 3 days. If possible do not take ibuprofen. Resume other medications as before. Soft foods for 2 days. No driving for 24 hours. Physician will call with biopsy results. Repeat dilation in 3-4 weeks.  Esophagogastroduodenoscopy, Care After Refer to this sheet in the next few weeks. These instructions provide you with information about caring for yourself after your procedure. Your health care provider may also give you more specific instructions. Your treatment has been planned according to current medical practices, but problems sometimes occur. Call your health care provider if you have any problems or questions after your procedure. What can I expect after the procedure? After the procedure, it is common to have:  A sore throat.  Nausea.  Bloating.  Dizziness.  Fatigue. Follow these instructions at home:  Do not eat or drink anything until the numbing medicine (local anesthetic) has worn off and your gag reflex has returned. You will know that the local anesthetic has worn off when you can swallow comfortably.  Do not drive for 24 hours if you received a medicine to help you relax (sedative).  If your health care provider took a tissue sample for testing during the procedure, make sure to get your test results. This is your responsibility. Ask your health care provider or the department performing the test when your results will be ready.  Keep all follow-up visits as told by your health care provider. This is important. Contact a health care provider if:  You cannot stop coughing.  You are not urinating.  You are urinating less than usual. Get help right away if:  You have trouble swallowing.  You cannot eat or drink.  You have throat or chest pain that gets worse.  You are dizzy or light-headed.  You faint.  You have nausea or vomiting.  You have chills.  You have a fever.  You have severe abdominal pain.  You have black, tarry, or  bloody stools. This information is not intended to replace advice given to you by your health care provider. Make sure you discuss any questions you have with your health care provider. Document Released: 08/08/2012 Document Revised: 01/28/2016 Document Reviewed: 07/16/2015 Elsevier Interactive Patient Education  2017 [employer]   Esophageal Dilatation Esophageal dilatation is a procedure to open a blocked or narrowed part of the esophagus. The esophagus is the long tube in your throat that carries food and liquid from your mouth to your stomach. The procedure is also called esophageal dilation. You may need this procedure if you have a buildup of scar tissue in your esophagus that makes it difficult, painful, or even impossible to swallow. This can be caused by gastroesophageal reflux disease (GERD). In rare cases, people need this procedure because they have cancer of the esophagus or a problem with the way food moves through the esophagus. Sometimes you may need to have another dilatation to enlarge the opening of the esophagus gradually. Tell a health care provider about:  Any allergies you have.  All medicines you are taking, including vitamins, herbs, eye drops, creams, and over-the-counter medicines.  Any problems you or family members have had with anesthetic medicines.  Any blood disorders you have.  Any surgeries you have had.  Any medical conditions you have.  Any antibiotic medicines you are required to take before dental procedures. What are the risks? Generally, this is a safe procedure. However, problems can occur and include:  Bleeding from a tear in the lining of the esophagus.  A hole (perforation) in the esophagus. What happens before the procedure?  Do not eat or drink anything after midnight on the night before the procedure or as directed by your health care provider.  Ask your health care provider about changing or stopping your regular medicines. This is  especially important if you are taking diabetes medicines or blood thinners.  Plan to have someone take you home after the procedure. What happens during the procedure?  You will be given a medicine that makes you relaxed and sleepy (sedative).  A medicine may be sprayed or gargled to numb the back of the throat.  Your health care provider can use various instruments to do an esophageal dilatation. During the procedure, the instrument used will be placed in your mouth and passed down into your esophagus. Options include:  Simple dilators. This instrument is carefully placed in the esophagus to stretch it.  Guided wire bougies. In this method, a flexible tube (endoscope) is used to insert a wire into the esophagus. The dilator is passed over this wire to enlarge the esophagus. Then the wire is removed.  Balloon dilators. An endoscope with a small balloon at the end is passed down into the esophagus. Inflating the balloon gently stretches the esophagus and opens it up. What happens after the procedure?  Your blood pressure, heart rate, breathing rate, and blood oxygen level will be monitored often until the medicines you were given have worn off.  Your throat may feel slightly sore and will probably still feel numb. This will improve slowly over time.  You will not be allowed to eat or drink until the throat numbness has resolved.  If this is a same-day procedure, you may be allowed to go home once you have been able to drink, urinate, and sit on the edge of the bed without nausea or dizziness.  If this is a same-day procedure, you should have a friend or family member with you for the next 24 hours after the procedure. This information is not intended to replace advice given to you by your health care provider. Make sure you discuss any questions you have with your health care provider. Document Released: 10/13/2005 Document Revised: 01/28/2016 Document Reviewed: 01/01/2014 Elsevier  Interactive Patient Education  2017 [employer].

## 2017-01-06 NOTE — Anesthesia Postprocedure Evaluation (Signed)
Anesthesia Post Note  Patient: [name]  Procedure(s) Performed: Procedure(s) (LRB): ESOPHAGOGASTRODUODENOSCOPY (EGD) WITH PROPOFOL (N/A) ESOPHAGEAL DILATION (N/A) BIOPSY  Patient location during evaluation: PACU Anesthesia Type: MAC Level of consciousness: awake and patient cooperative Pain management: pain level controlled Vital Signs Assessment: post-procedure vital signs reviewed and stable Respiratory status: spontaneous breathing, nonlabored ventilation and respiratory function stable Cardiovascular status: blood pressure returned to baseline Postop Assessment: no signs of nausea or vomiting Anesthetic complications: no     Last Vitals:  Vitals:   01/06/17 1105 01/06/17 1110  BP: 129/67 116/73  Pulse:    Resp: 20 20  Temp:      Last Pain:  Vitals:   01/06/17 1010  TempSrc: Oral  PainSc: 4                  [name]

## 2017-01-06 NOTE — Transfer of Care (Signed)
Immediate Anesthesia Transfer of Care Note  Patient: Kathleen Valenzuela Needs  Procedure(s) Performed: Procedure(s) with comments: ESOPHAGOGASTRODUODENOSCOPY (EGD) WITH PROPOFOL (N/A) - 11:20 ESOPHAGEAL DILATION (N/A) BIOPSY - esophageal biopsy  Patient Location: PACU  Anesthesia Type:MAC  Level of Consciousness: awake and patient cooperative  Airway & Oxygen Therapy: Patient Spontanous Breathing and Patient connected to face mask oxygen  Post-op Assessment: Report given to RN and Post -op Vital signs reviewed and stable  Post vital signs: Reviewed and stable  Last Vitals:  Vitals:   01/06/17 1105 01/06/17 1110  BP: 129/67 116/73  Pulse:    Resp: 20 20  Temp:      Last Pain:  Vitals:   01/06/17 1010  TempSrc: Oral  PainSc: 4       Patients Stated Pain Goal: 7 (42/87/68 1157)  Complications: No apparent anesthesia complications

## 2017-01-06 NOTE — H&P (Signed)
Kathleen Valenzuela is a 66 y.o. female.   Chief Complaint: Patient is here for EGD and ED. 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 It was lost dilated in May 2017 to 16.5 mm. He states she has been having difficulty for close to 6 months and has been gradually getting worse. She denies anorexia weight loss nausea or vomiting. She does report getting significant improvement with prior dilations. Esophageal biopsies were negative for eosinophilic esophagitis.  Past Medical History:  Diagnosis Date  . Anemia   . Anxiety   . Arthritis   . Bipolar 1 disorder ([place])   . Chronic back pain   . COPD (chronic obstructive pulmonary disease) ([place])   . Duodenal stricture   . Early onset Alzheimer's dementia   . GERD (gastroesophageal reflux disease)   . Heart murmur   . High cholesterol   . Hypertension   . Shortness of breath dyspnea     Past Surgical History:  Procedure Laterality Date  . CESAREAN SECTION    . COLONOSCOPY    . ESOPHAGEAL DILATION N/A 10/29/2015   Procedure: ESOPHAGEAL DILATION;  Surgeon: Rogene Houston, MD;  [facility];  Service: Endoscopy;  Laterality: N/A;  . ESOPHAGEAL DILATION N/A 01/29/2016   Procedure: ESOPHAGEAL DILATION;  Surgeon: Rogene Houston, MD;  Location: [facility];  Service: Endoscopy;  Laterality: N/A;  . ESOPHAGOGASTRODUODENOSCOPY N/A 10/29/2015   Procedure: ESOPHAGOGASTRODUODENOSCOPY (EGD);  Surgeon: Rogene Houston, MD;  Location: [facility];  Service: Endoscopy;  Laterality: N/A;  1200  . ESOPHAGOGASTRODUODENOSCOPY (EGD) WITH PROPOFOL N/A 01/29/2016   Procedure: ESOPHAGOGASTRODUODENOSCOPY (EGD) WITH PROPOFOL;  Surgeon: Rogene Houston, MD;  Location: [facility];  Service: Endoscopy;  Laterality: N/A;  7:30 - moved to 4/21 @11 : 25 - Ann notified pt to arrive at 10:00  . FOOT SURGERY Right    bunionectomy  . HEMORRHOID SURGERY      History  reviewed. No pertinent family history. Social History:  reports that she quit smoking about 17 months ago. Her smoking use included Cigarettes. She has a 70.00 pack-year smoking history. She has never used smokeless tobacco. She reports that she does not drink alcohol or use drugs.  Allergies:  Allergies  Allergen Reactions  . Penicillins Rash    Per mar    Medications Prior to Admission  Medication Sig Dispense Refill  . albuterol (PROVENTIL HFA;VENTOLIN HFA) 108 (90 BASE) MCG/ACT inhaler Inhale 2 puffs into the lungs 4 (four) times daily. (0800, 1200, 1600, & 2000)    . ALPRAZolam (XANAX) 1 MG tablet Take 1 mg by mouth 3 (three) times daily. (0800, 1400, & 2000)    . atorvastatin (LIPITOR) 10 MG tablet Take 10 mg by mouth daily at 8 pm.     . calcium carbonate (TUMS - DOSED IN MG ELEMENTAL CALCIUM) 500 MG chewable tablet Chew 2 tablets by mouth every 2 (two) hours as needed for indigestion (CHEW BEFORE SWALLOWING).    Marland Kitchen dicyclomine (BENTYL) 20 MG tablet Take 20 mg by mouth 4 (four) times daily. (0800, 1200, 1600, & 2000)    . docusate sodium (COLACE) 100 MG capsule Take 100 mg by mouth 2 (two) times daily. (0800 & 2000)    . donepezil (ARICEPT) 10 MG tablet Take 10 mg by mouth daily at 8 pm.    . Ferrous Gluconate 324 (37.5 Fe) MG TABS Take 324 mg by mouth 2 (two)  times daily. (0800 & 2000)    . fluticasone (FLONASE) 50 MCG/ACT nasal spray Place 1 spray into both nostrils daily. (0800)    . Fluticasone Furoate-Vilanterol (BREO ELLIPTA) 200-25 MCG/INH AEPB Inhale 1 puff into the lungs daily. (0800)    . furosemide (LASIX) 40 MG tablet Take 40 mg by mouth daily. (0800)    . ibuprofen (ADVIL,MOTRIN) 800 MG tablet Take 800 mg by mouth 2 (two) times daily. (0800 & 2000)    . ipratropium (ATROVENT) 0.02 % nebulizer solution Take 0.5 mg by nebulization every 8 (eight) hours as needed for shortness of breath.    . isosorbide mononitrate (IMDUR) 30 MG 24 hr tablet Take 30 mg by mouth daily. (0800)     . lamoTRIgine (LAMICTAL) 100 MG tablet Take 100 mg by mouth daily. (0800)    . lithium 300 MG tablet Take 300 mg by mouth 2 (two) times daily. (0800 & 2000)    . lurasidone (LATUDA) 20 MG TABS tablet Take 20 mg by mouth daily at 8 pm.    . Menthol-Zinc Oxide (RISAMINE) 0.44-20.625 % OINT Apply 1 application topically 4 (four) times daily as needed (for rash/redness).    . metoprolol succinate (TOPROL-XL) 50 MG 24 hr tablet Take 50 mg by mouth daily. (0800)Take with or immediately following a meal.    . montelukast (SINGULAIR) 10 MG tablet Take 10 mg by mouth daily. (0800)    . nystatin (NYSTATIN) powder Apply 1 g topically every 8 (eight) hours as needed (for yeast/rash under breast & groin area(s)).    Marland Kitchen Olopatadine HCl (PATADAY) 0.2 % SOLN Place 1 drop into both eyes daily. (0800)    . oxyCODONE (OXYCONTIN) 20 mg 12 hr tablet Take 20 mg by mouth every 12 (twelve) hours. (0800 & 2000)    . Oxycodone HCl 10 MG TABS Take 10 mg by mouth every 6 (six) hours as needed for pain.    . polyethylene glycol (MIRALAX / GLYCOLAX) packet Take 17 g by mouth daily as needed (FOR CONSTIPATION).     Marland Kitchen potassium chloride (K-DUR,KLOR-CON) 10 MEQ tablet Take 10 mEq by mouth daily. (0800)    . tiZANidine (ZANAFLEX) 2 MG tablet Take 2 mg by mouth 2 (two) times daily. (0800 & 2000)    . traZODone (DESYREL) 50 MG tablet Take 50 mg by mouth daily at 8 pm.     . VOLTAREN 1 % GEL Apply 4 g topically every 12 (twelve) hours as needed. For knee pain      No results found for this or any previous visit (from the past 48 hour(s)). No results found.  ROS  Blood pressure 112/63, pulse 62, temperature 98.4 F (36.9 C), temperature source Oral, resp. rate (!) 36, SpO2 92 %. Physical Exam  Constitutional: She appears well-developed and well-nourished.  HENT:  Mouth/Throat: Oropharynx is clear and moist.  Eyes: Conjunctivae are normal. No scleral icterus.  Neck: No thyromegaly present.  Cardiovascular: Normal rate,  regular rhythm and normal heart sounds.   No murmur heard. Respiratory: Effort normal and breath sounds normal.  GI: Soft. She exhibits no distension and no mass. There is no tenderness.  Musculoskeletal: She exhibits no edema.  Lymphadenopathy:    She has no cervical adenopathy.  Neurological: She is alert.  Skin: Skin is warm and dry.     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 EGD with ED.  Hildred Laser, MD 01/06/2017, 11:07 AM

## 2017-01-09 ENCOUNTER — Encounter (HOSPITAL_COMMUNITY): Payer: Self-pay | Admitting: Internal Medicine

## 2017-01-10 ENCOUNTER — Telehealth (INDEPENDENT_AMBULATORY_CARE_PROVIDER_SITE_OTHER): Payer: Self-pay | Admitting: Internal Medicine

## 2017-01-10 ENCOUNTER — Encounter (INDEPENDENT_AMBULATORY_CARE_PROVIDER_SITE_OTHER): Payer: Self-pay | Admitting: *Deleted

## 2017-01-10 ENCOUNTER — Other Ambulatory Visit (INDEPENDENT_AMBULATORY_CARE_PROVIDER_SITE_OTHER): Payer: Self-pay | Admitting: *Deleted

## 2017-01-10 DIAGNOSIS — K259 Gastric ulcer, unspecified as acute or chronic, without hemorrhage or perforation: Secondary | ICD-10-CM | POA: Insufficient documentation

## 2017-01-10 NOTE — Telephone Encounter (Signed)
Kathleen Valenzuela has talked with the patient.

## 2017-01-10 NOTE — Telephone Encounter (Signed)
Patient called, stated that she missed Dr. Olevia Perches call.  Stated that someone went to get her and before she got to the phone he was gone.  She is very anxious to hear about her biopsy results.  [phone]

## 2017-01-11 DIAGNOSIS — M545 Low back pain: Secondary | ICD-10-CM | POA: Diagnosis not present

## 2017-01-11 DIAGNOSIS — M47817 Spondylosis without myelopathy or radiculopathy, lumbosacral region: Secondary | ICD-10-CM | POA: Diagnosis not present

## 2017-01-11 DIAGNOSIS — M79605 Pain in left leg: Secondary | ICD-10-CM | POA: Diagnosis not present

## 2017-01-11 DIAGNOSIS — M5136 Other intervertebral disc degeneration, lumbar region: Secondary | ICD-10-CM | POA: Diagnosis not present

## 2017-01-12 DIAGNOSIS — F3132 Bipolar disorder, current episode depressed, moderate: Secondary | ICD-10-CM | POA: Diagnosis not present

## 2017-01-16 DIAGNOSIS — M5136 Other intervertebral disc degeneration, lumbar region: Secondary | ICD-10-CM | POA: Diagnosis not present

## 2017-01-16 DIAGNOSIS — G894 Chronic pain syndrome: Secondary | ICD-10-CM | POA: Diagnosis not present

## 2017-01-16 DIAGNOSIS — M419 Scoliosis, unspecified: Secondary | ICD-10-CM | POA: Diagnosis not present

## 2017-01-16 DIAGNOSIS — M25569 Pain in unspecified knee: Secondary | ICD-10-CM | POA: Diagnosis not present

## 2017-01-19 DIAGNOSIS — F3132 Bipolar disorder, current episode depressed, moderate: Secondary | ICD-10-CM | POA: Diagnosis not present

## 2017-01-26 DIAGNOSIS — F3132 Bipolar disorder, current episode depressed, moderate: Secondary | ICD-10-CM | POA: Diagnosis not present

## 2017-02-09 DIAGNOSIS — I10 Essential (primary) hypertension: Secondary | ICD-10-CM | POA: Diagnosis not present

## 2017-02-09 DIAGNOSIS — F3132 Bipolar disorder, current episode depressed, moderate: Secondary | ICD-10-CM | POA: Diagnosis not present

## 2017-02-13 DIAGNOSIS — G894 Chronic pain syndrome: Secondary | ICD-10-CM | POA: Diagnosis not present

## 2017-02-13 DIAGNOSIS — M5136 Other intervertebral disc degeneration, lumbar region: Secondary | ICD-10-CM | POA: Diagnosis not present

## 2017-02-13 DIAGNOSIS — M47817 Spondylosis without myelopathy or radiculopathy, lumbosacral region: Secondary | ICD-10-CM | POA: Diagnosis not present

## 2017-02-13 DIAGNOSIS — M47816 Spondylosis without myelopathy or radiculopathy, lumbar region: Secondary | ICD-10-CM | POA: Diagnosis not present

## 2017-02-13 DIAGNOSIS — M25569 Pain in unspecified knee: Secondary | ICD-10-CM | POA: Diagnosis not present

## 2017-02-14 ENCOUNTER — Encounter (HOSPITAL_COMMUNITY)
Admission: RE | Admit: 2017-02-14 | Discharge: 2017-02-14 | Disposition: A | Discharge status: Home / Self Care | Payer: Medicare Other | Source: Ambulatory Visit | Attending: Internal Medicine | Admitting: Internal Medicine

## 2017-02-15 ENCOUNTER — Encounter (HOSPITAL_COMMUNITY): Payer: Self-pay

## 2017-02-15 NOTE — Pre-Procedure Instructions (Signed)
PAT instructions faxed to Kathleen Valenzuela at [place]

## 2017-02-15 NOTE — Patient Instructions (Signed)
Kathleen Valenzuela  02/15/2017     @PREFPERIOPPHARMACY @   Your procedure is scheduled on 02/17/17.  Report to [facility] at 9:00 A.M.  Call this number if you have problems the morning of surgery:  [phone]   Remember:  Do not eat food or drink liquids after midnight.  Take these medicines the morning of surgery with A SIP OF WATER Metoprolol, Xanax, Imdur, Lithium, Singulair, Oxycodone, Zanaflex  Use inhaler and bring with you to hospital   Do not wear jewelry, make-up or nail polish.  Do not wear lotions, powders, or perfumes, or deoderant.  Do not shave 48 hours prior to surgery.  Men may shave face and neck.  Do not bring valuables to the hospital.  [facility] is not responsible for any belongings or valuables.  Contacts, dentures or bridgework may not be worn into surgery.  Leave your suitcase in the car.  After surgery it may be brought to your room.  For patients admitted to the hospital, discharge time will be determined by your treatment team.  Patients discharged the day of surgery will not be allowed to drive home.    Please read over the following fact sheets that you were given. Anesthesia Post-op Instructions     PATIENT INSTRUCTIONS POST-ANESTHESIA  IMMEDIATELY FOLLOWING SURGERY:  Do not drive or operate machinery for the first twenty four hours after surgery.  Do not make any important decisions for twenty four hours after surgery or while taking narcotic pain medications or sedatives.  If you develop intractable nausea and vomiting or a severe headache please notify your doctor immediately.  FOLLOW-UP:  Please make an appointment with your surgeon as instructed. You do not need to follow up with anesthesia unless specifically instructed to do so.  WOUND CARE INSTRUCTIONS (if applicable):  Keep a dry clean dressing on the anesthesia/puncture wound site if there is drainage.  Once the wound has quit draining you may leave it open to air.  Generally you should  leave the bandage intact for twenty four hours unless there is drainage.  If the epidural site drains for more than 36-48 hours please call the anesthesia department.  QUESTIONS?:  Please feel free to call your physician or the hospital operator if you have any questions, and they will be happy to assist you.      Esophagogastroduodenoscopy Esophagogastroduodenoscopy (EGD) is a procedure to examine the lining of the esophagus, stomach, and first part of the small intestine (duodenum). This procedure is done to check for problems such as inflammation, bleeding, ulcers, or growths. During this procedure, a long, flexible, lighted tube with a camera attached (endoscope) is inserted down the throat. Tell a health care provider about:  Any allergies you have.  All medicines you are taking, including vitamins, herbs, eye drops, creams, and over-the-counter medicines.  Any problems you or family members have had with anesthetic medicines.  Any blood disorders you have.  Any surgeries you have had.  Any medical conditions you have.  Whether you are pregnant or may be pregnant. What are the risks? Generally, this is a safe procedure. However, problems may occur, including:  Infection.  Bleeding.  A tear (perforation) in the esophagus, stomach, or duodenum.  Trouble breathing.  Excessive sweating.  Spasms of the larynx.  A slowed heartbeat.  Low blood pressure.  What happens before the procedure?  Follow instructions from your health care provider about eating or drinking restrictions.  Ask your health care provider about: ?  Changing or stopping your regular medicines. This is especially important if you are taking diabetes medicines or blood thinners. ? Taking medicines such as aspirin and ibuprofen. These medicines can thin your blood. Do not take these medicines before your procedure if your health care provider instructs you not to.  Plan to have someone take you home  after the procedure.  If you wear dentures, be ready to remove them before the procedure. What happens during the procedure?  To reduce your risk of infection, your health care team will wash or sanitize their hands.  An IV tube will be put in a vein in your [place] or arm. You will get medicines and fluids through this tube.  You will be given one or more of the following: ? A medicine to help you relax (sedative). ? A medicine to numb the area (local anesthetic). This medicine may be sprayed into your throat. It will make you feel more comfortable and keep you from gagging or coughing during the procedure. ? A medicine for pain.  A mouth guard may be placed in your mouth to protect your teeth and to keep you from biting on the endoscope.  You will be asked to lie on your left side.  The endoscope will be lowered down your throat into your esophagus, stomach, and duodenum.  Air will be put into the endoscope. This will help your health care provider see better.  The lining of your esophagus, stomach, and duodenum will be examined.  Your health care provider may: ? Take a tissue sample so it can be looked at in a lab (biopsy). ? Remove growths. ? Remove objects (foreign bodies) that are stuck. ? Treat any bleeding with medicines or other devices that stop tissue from bleeding. ? Widen (dilate) or stretch narrowed areas of your esophagus and stomach.  The endoscope will be taken out. The procedure may vary among health care providers and hospitals. What happens after the procedure?  Your blood pressure, heart rate, breathing rate, and blood oxygen level will be monitored often until the medicines you were given have worn off.  Do not eat or drink anything until the numbing medicine has worn off and your gag reflex has returned. This information is not intended to replace advice given to you by your health care provider. Make sure you discuss any questions you have with your health  care provider. Document Released: 12/23/2004 Document Revised: 01/28/2016 Document Reviewed: 07/16/2015 Elsevier Interactive Patient Education  [employer].

## 2017-02-16 DIAGNOSIS — F3132 Bipolar disorder, current episode depressed, moderate: Secondary | ICD-10-CM | POA: Diagnosis not present

## 2017-02-17 ENCOUNTER — Encounter (HOSPITAL_COMMUNITY): Payer: Self-pay

## 2017-02-17 ENCOUNTER — Encounter (HOSPITAL_COMMUNITY)
Admission: RE | Disposition: A | Discharge status: Home / Self Care | Payer: Self-pay | Source: Ambulatory Visit | Attending: Internal Medicine

## 2017-02-17 ENCOUNTER — Ambulatory Visit (HOSPITAL_COMMUNITY)
Admission: RE | Admit: 2017-02-17 | Discharge: 2017-02-17 | Disposition: A | Discharge status: Home / Self Care | Payer: Medicare Other | Source: Ambulatory Visit | Attending: Internal Medicine | Admitting: Internal Medicine

## 2017-02-17 ENCOUNTER — Ambulatory Visit (HOSPITAL_COMMUNITY): Payer: Medicare Other | Admitting: Anesthesiology

## 2017-02-17 DIAGNOSIS — R0902 Hypoxemia: Secondary | ICD-10-CM | POA: Diagnosis not present

## 2017-02-17 DIAGNOSIS — Z888 Allergy status to other drugs, medicaments and biological substances status: Secondary | ICD-10-CM | POA: Diagnosis not present

## 2017-02-17 DIAGNOSIS — Z79899 Other long term (current) drug therapy: Secondary | ICD-10-CM | POA: Diagnosis not present

## 2017-02-17 DIAGNOSIS — M549 Dorsalgia, unspecified: Secondary | ICD-10-CM | POA: Insufficient documentation

## 2017-02-17 DIAGNOSIS — T4275XA Adverse effect of unspecified antiepileptic and sedative-hypnotic drugs, initial encounter: Secondary | ICD-10-CM | POA: Diagnosis present

## 2017-02-17 DIAGNOSIS — F419 Anxiety disorder, unspecified: Secondary | ICD-10-CM | POA: Insufficient documentation

## 2017-02-17 DIAGNOSIS — Z79891 Long term (current) use of opiate analgesic: Secondary | ICD-10-CM | POA: Diagnosis not present

## 2017-02-17 DIAGNOSIS — E7800 Pure hypercholesterolemia, unspecified: Secondary | ICD-10-CM | POA: Insufficient documentation

## 2017-02-17 DIAGNOSIS — M810 Age-related osteoporosis without current pathological fracture: Secondary | ICD-10-CM | POA: Diagnosis present

## 2017-02-17 DIAGNOSIS — I10 Essential (primary) hypertension: Secondary | ICD-10-CM | POA: Diagnosis not present

## 2017-02-17 DIAGNOSIS — J449 Chronic obstructive pulmonary disease, unspecified: Secondary | ICD-10-CM | POA: Insufficient documentation

## 2017-02-17 DIAGNOSIS — F319 Bipolar disorder, unspecified: Secondary | ICD-10-CM | POA: Diagnosis present

## 2017-02-17 DIAGNOSIS — K259 Gastric ulcer, unspecified as acute or chronic, without hemorrhage or perforation: Secondary | ICD-10-CM | POA: Insufficient documentation

## 2017-02-17 DIAGNOSIS — J189 Pneumonia, unspecified organism: Secondary | ICD-10-CM | POA: Diagnosis not present

## 2017-02-17 DIAGNOSIS — R0602 Shortness of breath: Secondary | ICD-10-CM | POA: Diagnosis not present

## 2017-02-17 DIAGNOSIS — R404 Transient alteration of awareness: Secondary | ICD-10-CM | POA: Diagnosis not present

## 2017-02-17 DIAGNOSIS — K449 Diaphragmatic hernia without obstruction or gangrene: Secondary | ICD-10-CM | POA: Diagnosis not present

## 2017-02-17 DIAGNOSIS — K766 Portal hypertension: Secondary | ICD-10-CM | POA: Insufficient documentation

## 2017-02-17 DIAGNOSIS — K210 Gastro-esophageal reflux disease with esophagitis: Secondary | ICD-10-CM | POA: Diagnosis not present

## 2017-02-17 DIAGNOSIS — G300 Alzheimer's disease with early onset: Secondary | ICD-10-CM | POA: Insufficient documentation

## 2017-02-17 DIAGNOSIS — Z880 Allergy status to penicillin: Secondary | ICD-10-CM | POA: Diagnosis not present

## 2017-02-17 DIAGNOSIS — K315 Obstruction of duodenum: Secondary | ICD-10-CM | POA: Diagnosis not present

## 2017-02-17 DIAGNOSIS — K3189 Other diseases of stomach and duodenum: Secondary | ICD-10-CM | POA: Insufficient documentation

## 2017-02-17 DIAGNOSIS — F0280 Dementia in other diseases classified elsewhere without behavioral disturbance: Secondary | ICD-10-CM | POA: Diagnosis not present

## 2017-02-17 DIAGNOSIS — R1314 Dysphagia, pharyngoesophageal phase: Secondary | ICD-10-CM | POA: Diagnosis not present

## 2017-02-17 DIAGNOSIS — D649 Anemia, unspecified: Secondary | ICD-10-CM | POA: Diagnosis present

## 2017-02-17 DIAGNOSIS — Z87891 Personal history of nicotine dependence: Secondary | ICD-10-CM | POA: Insufficient documentation

## 2017-02-17 DIAGNOSIS — J690 Pneumonitis due to inhalation of food and vomit: Secondary | ICD-10-CM | POA: Diagnosis not present

## 2017-02-17 DIAGNOSIS — R0682 Tachypnea, not elsewhere classified: Secondary | ICD-10-CM | POA: Diagnosis not present

## 2017-02-17 DIAGNOSIS — R1310 Dysphagia, unspecified: Secondary | ICD-10-CM | POA: Diagnosis not present

## 2017-02-17 DIAGNOSIS — Z886 Allergy status to analgesic agent status: Secondary | ICD-10-CM | POA: Diagnosis not present

## 2017-02-17 DIAGNOSIS — K222 Esophageal obstruction: Secondary | ICD-10-CM | POA: Insufficient documentation

## 2017-02-17 DIAGNOSIS — G894 Chronic pain syndrome: Secondary | ICD-10-CM | POA: Diagnosis present

## 2017-02-17 DIAGNOSIS — K5730 Diverticulosis of large intestine without perforation or abscess without bleeding: Secondary | ICD-10-CM | POA: Diagnosis not present

## 2017-02-17 DIAGNOSIS — G8929 Other chronic pain: Secondary | ICD-10-CM | POA: Insufficient documentation

## 2017-02-17 DIAGNOSIS — R4182 Altered mental status, unspecified: Secondary | ICD-10-CM | POA: Diagnosis not present

## 2017-02-17 DIAGNOSIS — E785 Hyperlipidemia, unspecified: Secondary | ICD-10-CM | POA: Diagnosis present

## 2017-02-17 DIAGNOSIS — R1312 Dysphagia, oropharyngeal phase: Secondary | ICD-10-CM | POA: Diagnosis not present

## 2017-02-17 DIAGNOSIS — K219 Gastro-esophageal reflux disease without esophagitis: Secondary | ICD-10-CM | POA: Diagnosis present

## 2017-02-17 DIAGNOSIS — R918 Other nonspecific abnormal finding of lung field: Secondary | ICD-10-CM | POA: Diagnosis not present

## 2017-02-17 DIAGNOSIS — Z7951 Long term (current) use of inhaled steroids: Secondary | ICD-10-CM | POA: Diagnosis not present

## 2017-02-17 HISTORY — PX: ESOPHAGOGASTRODUODENOSCOPY (EGD) WITH PROPOFOL: SHX5813

## 2017-02-17 SURGERY — ESOPHAGOGASTRODUODENOSCOPY (EGD) WITH PROPOFOL
Anesthesia: Monitor Anesthesia Care

## 2017-02-17 MED ORDER — PROPOFOL 10 MG/ML IV BOLUS
INTRAVENOUS | Status: AC
  Filled 2017-02-17: qty 20

## 2017-02-17 MED ORDER — PROPOFOL 500 MG/50ML IV EMUL
INTRAVENOUS | Status: DC | PRN
  Administered 2017-02-17: 50 ug/kg/min via INTRAVENOUS

## 2017-02-17 MED ORDER — PROPOFOL 10 MG/ML IV BOLUS
INTRAVENOUS | Status: DC | PRN
Start: 2017-02-17 — End: 2017-02-17
  Administered 2017-02-17 (×2): 10 mg via INTRAVENOUS

## 2017-02-17 MED ORDER — CHLORHEXIDINE GLUCONATE CLOTH 2 % EX PADS: 6.0000 | MEDICATED_PAD | Freq: Once | CUTANEOUS | Status: DC

## 2017-02-17 MED ORDER — PANTOPRAZOLE SODIUM 40 MG PO TBEC: 40.0000 mg | DELAYED_RELEASE_TABLET | Freq: Two times a day (BID) | ORAL | 2 refills | Status: DC

## 2017-02-17 MED ORDER — MIDAZOLAM HCL 2 MG/2ML IJ SOLN
INTRAMUSCULAR | Status: AC
  Filled 2017-02-17: qty 2

## 2017-02-17 MED ORDER — MIDAZOLAM HCL 2 MG/2ML IJ SOLN
1.0000 mg | INTRAMUSCULAR | Status: AC
  Administered 2017-02-17: 1 mg via INTRAVENOUS

## 2017-02-17 MED ORDER — LACTATED RINGERS IV SOLN
INTRAVENOUS | Status: DC
  Administered 2017-02-17: 10:00:00 via INTRAVENOUS

## 2017-02-17 MED ORDER — LIDOCAINE VISCOUS 2 % MT SOLN
OROMUCOSAL | Status: AC
  Filled 2017-02-17: qty 15

## 2017-02-17 NOTE — Transfer of Care (Signed)
Immediate Anesthesia Transfer of Care Note  Patient: Kathleen Valenzuela  Procedure(s) Performed: Procedure(s) with comments: ESOPHAGOGASTRODUODENOSCOPY (EGD) WITH PROPOFOL (N/A) - 10:30  Patient Location: PACU  Anesthesia Type:MAC  Level of Consciousness: awake  Airway & Oxygen Therapy: Patient Spontanous Breathing and Patient connected to face mask oxygen  Post-op Assessment: Report given to RN  Post vital signs: Reviewed and stable  Last Vitals:  Vitals:   02/17/17 0913  BP: 137/67  Pulse: 69  Resp: 16  Temp: 36.3 C    Last Pain:  Vitals:   02/17/17 1021  TempSrc:   PainSc: 5          Complications: No apparent anesthesia complications

## 2017-02-17 NOTE — Anesthesia Postprocedure Evaluation (Signed)
Anesthesia Post Note  Patient: Kathleen Valenzuela  Procedure(s) Performed: Procedure(s) (LRB): ESOPHAGOGASTRODUODENOSCOPY (EGD) WITH PROPOFOL (N/A)  Patient location during evaluation: PACU Anesthesia Type: MAC Level of consciousness: awake and alert and oriented Pain management: pain level controlled Vital Signs Assessment: post-procedure vital signs reviewed and stable Respiratory status: spontaneous breathing Cardiovascular status: blood pressure returned to baseline Postop Assessment: no signs of nausea or vomiting Anesthetic complications: no Comments: Late entry     Last Vitals:  Vitals:   02/17/17 1107 02/17/17 1114  BP: 129/63 (!) 150/92  Pulse: 71 69  Resp: 18 18  Temp: 36.8 C 36.6 C    Last Pain:  Vitals:   02/17/17 1114  TempSrc: Oral  PainSc:                  [name]

## 2017-02-17 NOTE — Discharge Instructions (Signed)
Remember not to take aspirin on other OTC NSAIDs like ibuprofen and Naprosyn. Pantoprazole 40 mg by mouth 30 minutes before breakfast and evening meal daily. Will plan to change dose in 3 months. Resume other medications and diet as before. Remember to use dentures at each meal and chew food thoroughly. No driving for 24 hours. Office visit in 8 weeks.    PATIENT INSTRUCTIONS POST-ANESTHESIA  IMMEDIATELY FOLLOWING SURGERY:  Do not drive or operate machinery for the first twenty four hours after surgery.  Do not make any important decisions for twenty four hours after surgery or while taking narcotic pain medications or sedatives.  If you develop intractable nausea and vomiting or a severe headache please notify your doctor immediately.  FOLLOW-UP:  Please make an appointment with your surgeon as instructed. You do not need to follow up with anesthesia unless specifically instructed to do so.  WOUND CARE INSTRUCTIONS (if applicable):  Keep a dry clean dressing on the anesthesia/puncture wound site if there is drainage.  Once the wound has quit draining you may leave it open to air.  Generally you should leave the bandage intact for twenty four hours unless there is drainage.  If the epidural site drains for more than 36-48 hours please call the anesthesia department.  QUESTIONS?:  Please feel free to call your physician or the hospital operator if you have any questions, and they will be happy to assist you.       Esophagogastroduodenoscopy, Care After Refer to this sheet in the next few weeks. These instructions provide you with information about caring for yourself after your procedure. Your health care provider may also give you more specific instructions. Your treatment has been planned according to current medical practices, but problems sometimes occur. Call your health care provider if you have any problems or questions after your procedure. What can I expect after the procedure? After  the procedure, it is common to have:  A sore throat.  Nausea.  Bloating.  Dizziness.  Fatigue.  Follow these instructions at home:  Do not eat or drink anything until the numbing medicine (local anesthetic) has worn off and your gag reflex has returned. You will know that the local anesthetic has worn off when you can swallow comfortably.  Do not drive for 24 hours if you received a medicine to help you relax (sedative).  If your health care provider took a tissue sample for testing during the procedure, make sure to get your test results. This is your responsibility. Ask your health care provider or the department performing the test when your results will be ready.  Keep all follow-up visits as told by your health care provider. This is important. Contact a health care provider if:  You cannot stop coughing.  You are not urinating.  You are urinating less than usual. Get help right away if:  You have trouble swallowing.  You cannot eat or drink.  You have throat or chest pain that gets worse.  You are dizzy or light-headed.  You faint.  You have nausea or vomiting.  You have chills.  You have a fever.  You have severe abdominal pain.  You have black, tarry, or bloody stools. This information is not intended to replace advice given to you by your health care provider. Make sure you discuss any questions you have with your health care provider. Document Released: 08/08/2012 Document Revised: 01/28/2016 Document Reviewed: 07/16/2015 Elsevier Interactive Patient Education  2018 [employer]  A hiatal hernia occurs when part of the stomach slides above the muscle that separates the abdomen from the chest (diaphragm). A person can be born with a hiatal hernia (congenital), or it may develop over time. In almost all cases of hiatal hernia, only the top part of the stomach pushes through the diaphragm. Many people have a hiatal hernia with no  symptoms. The larger the hernia, the more likely it is that you will have symptoms. In some cases, a hiatal hernia allows stomach acid to flow back into the tube that carries food from your mouth to your stomach (esophagus). This may cause heartburn symptoms. Severe heartburn symptoms may mean that you have developed a condition called gastroesophageal reflux disease (GERD). What are the causes? This condition is caused by a weakness in the opening (hiatus) where the esophagus passes through the diaphragm to attach to the upper part of the stomach. A person may be born with a weakness in the hiatus, or a weakness can develop over time. What increases the risk? This condition is more likely to develop in:  Older people. Age is a major risk factor for a hiatal hernia, especially if you are over the age of 43.  Pregnant women.  People who are overweight.  People who have frequent constipation.  What are the signs or symptoms? Symptoms of this condition usually develop in the form of GERD symptoms. Symptoms include:  Heartburn.  Belching.  Indigestion.  Trouble swallowing.  Coughing or wheezing.  Sore throat.  Hoarseness.  Chest pain.  Nausea and vomiting.  How is this diagnosed? This condition may be diagnosed during testing for GERD. Tests that may be done include:  X-rays of your stomach or chest.  An upper gastrointestinal (GI) series. This is an X-ray exam of your GI tract that is taken after you swallow a chalky liquid that shows up clearly on the X-ray.  Endoscopy. This is a procedure to look into your stomach using a thin, flexible tube that has a tiny camera and light on the end of it.  How is this treated? This condition may be treated by:  Dietary and lifestyle changes to help reduce GERD symptoms.  Medicines. These may include: ? Over-the-counter antacids. ? Medicines that make your stomach empty more quickly. ? Medicines that block the production of  stomach acid (H2 blockers). ? Stronger medicines to reduce stomach acid (proton pump inhibitors).  Surgery to repair the hernia, if other treatments are not helping.  If you have no symptoms, you may not need treatment. Follow these instructions at home: Lifestyle and activity  Do not use any products that contain nicotine or tobacco, such as cigarettes and e-cigarettes. If you need help quitting, ask your health care provider.  Try to achieve and maintain a healthy body weight.  Avoid putting pressure on your abdomen. Anything that puts pressure on your abdomen increases the amount of acid that may be pushed up into your esophagus. ? Avoid bending over, especially after eating. ? Raise the head of your bed by putting blocks under the legs. This keeps your head and esophagus higher than your stomach. ? Do not wear tight clothing around your chest or stomach. ? Try not to strain when having a bowel movement, when urinating, or when lifting heavy objects. Eating and drinking  Avoid foods that can worsen GERD symptoms. These may include: ? Fatty foods, like fried foods. ? Citrus fruits, like oranges or lemon. ? Other foods and drinks that contain  acid, like orange juice or tomatoes. ? Spicy food. ? Chocolate.  Eat frequent small meals instead of three large meals a day. This helps prevent your stomach from getting too full. ? Eat slowly. ? Do not lie down right after eating. ? Do not eat 1-2 hours before bed.  Do not drink beverages with caffeine. These include cola, coffee, cocoa, and tea.  Do not drink alcohol. General instructions  Take over-the-counter and prescription medicines only as told by your health care provider.  Keep all follow-up visits as told by your health care provider. This is important. Contact a health care provider if:  Your symptoms are not controlled with medicines or lifestyle changes.  You are having trouble swallowing.  You have coughing or  wheezing that will not go away. Get help right away if:  Your pain is getting worse.  Your pain spreads to your arms, neck, jaw, teeth, or back.  You have shortness of breath.  You sweat for no reason.  You feel sick to your stomach (nauseous) or you vomit.  You vomit blood.  You have bright red blood in your stools.  You have black, tarry stools. This information is not intended to replace advice given to you by your health care provider. Make sure you discuss any questions you have with your health care provider. Document Released: 11/12/2003 Document Revised: 08/15/2016 Document Reviewed: 08/15/2016 Elsevier Interactive Patient Education  [employer].

## 2017-02-17 NOTE — Anesthesia Preprocedure Evaluation (Signed)
Anesthesia Evaluation  Patient identified by MRN, date of birth, ID band Patient awake    Reviewed: Allergy & Precautions, NPO status , Patient's Chart, lab work & pertinent test results  Airway Mallampati: II  TM Distance: >3 FB     Dental  (+) Edentulous Upper, Edentulous Lower   Pulmonary shortness of breath and with exertion, COPD, former smoker,    breath sounds clear to auscultation       Cardiovascular hypertension, Pt. on medications + DOE   Rhythm:Regular Rate:Normal     Neuro/Psych PSYCHIATRIC DISORDERS (early dementia) Anxiety Bipolar Disorder    GI/Hepatic PUD, GERD  ,  Endo/Other    Renal/GU      Musculoskeletal   Abdominal   Peds  Hematology  (+) anemia ,   Anesthesia Other Findings   Reproductive/Obstetrics                             Anesthesia Physical Anesthesia Plan  ASA: III  Anesthesia Plan: MAC   Post-op Pain Management:    Induction: Intravenous  PONV Risk Score and Plan:   Airway Management Planned: Simple Face Mask  Additional Equipment:   Intra-op Plan:   Post-operative Plan:   Informed Consent: I have reviewed the patients History and Physical, chart, labs and discussed the procedure including the risks, benefits and alternatives for the proposed anesthesia with the patient or authorized representative who has indicated his/her understanding and acceptance.     Plan Discussed with:   Anesthesia Plan Comments:         Anesthesia Quick Evaluation

## 2017-02-17 NOTE — Op Note (Signed)
[facility] Patient Name: Kathleen Valenzuela Procedure Date: 02/17/2017 10:03 AM MRN: [id number] Date of Birth: 11-Jan-1951 Attending MD: Hildred Laser , MD CSN: [id number] Age: 66 Admit Type: Outpatient Procedure:                Upper GI endoscopy Indications:              Esophageal dysphagia, For therapy of esophageal                            stenosis Providers:                Hildred Laser, MD, Otis Peak B. Sharon Seller, RN, Aram Candela Referring MD:             Celedonio Savage, MD Copy to Dr. Monico Blitz, MD Medicines:                Lidocaine spray, Propofol per Anesthesia Complications:            No immediate complications. Estimated Blood Loss:     Estimated blood loss was minimal. Procedure:                Pre-Anesthesia Assessment:                           - Prior to the procedure, a History and Physical                            was performed, and patient medications and                            allergies were reviewed. The patient's tolerance of                            previous anesthesia was also reviewed. The risks                            and benefits of the procedure and the sedation                            options and risks were discussed with the patient.                            All questions were answered, and informed consent                            was obtained. Prior Anticoagulants: The patient has                            taken no previous anticoagulant or antiplatelet                            agents. ASA Grade Assessment: III - A patient with  severe systemic disease. After reviewing the risks                            and benefits, the patient was deemed in                            satisfactory condition to undergo the procedure.                           After obtaining informed consent, the endoscope was                            passed under direct vision. Throughout the          procedure, the patient's blood pressure, pulse, and                            oxygen saturations were monitored continuously. The                            EG-[id number]) scope was introduced through the                            and advanced to the second part of duodenum. The                            upper GI endoscopy was accomplished without                            difficulty. The patient tolerated the procedure                            fairly well. Scope In: 10:30:21 AM Scope Out: 10:39:06 AM Total Procedure Duration: 0 hours 8 minutes 45 seconds  Findings:      The proximal esophagus and mid esophagus were normal.      One moderate benign-appearing, intrinsic stenosis was found 34 cm from       the incisors. This measured 1.1 cm (inner diameter) x 1 cm (in length)       and was traversed. A TTS dilator was passed through the scope. Dilation       with a 15-16.5-18 mm balloon dilator was performed to 15 mm, 16.5 mm and       18 mm. The dilation site was examined and showed moderate improvement in       luminal narrowing. mucosal disruption noted at distal esophagus and GE       junction.      LA Grade A (one or more mucosal breaks less than 5 mm, not extending       between tops of 2 mucosal folds) esophagitis was found 35 cm from the       incisors.      A 2 cm hiatal hernia was present.      One non-bleeding cratered gastric ulcer with no stigmata of bleeding was       found in the gastric antrum. The lesion was 5 mm in largest dimension.      Mild portal hypertensive gastropathy  was found in the gastric fundus and       in the gastric body.      The exam of the stomach was otherwise normal.      The duodenal bulb was normal.      An acquired benign-appearing, intrinsic moderate stenosis was found at       angle of duodenum and was traversed.      The second portion of the duodenum was normal. Impression:               - Normal proximal esophagus and mid  esophagus.                           - Benign-appearing distal esophageal stenosis.                            Dilated.                           - LA Grade A reflux esophagitis.                           - 2 cm hiatal hernia.                           - Non-bleeding gastric ulcer with no stigmata of                            bleeding.                           - Portal hypertensive gastropathy.                           - Normal duodenal bulb.                           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                           - Normal second portion of the duodenum.                           - No specimens collected.                           Comment: patient has developed erosive esophagitis                            since last EGD and gastric ulcer has not healed                            because she is not on PPI.                           Not clear when the medication wastopped. Moderate Sedation:      Per Anesthesia Care Recommendation:           - Patient has  a contact number available for                            emergencies. The signs and symptoms of potential                            delayed complications were discussed with the                            patient. Return to normal activities tomorrow.                            Written discharge instructions were provided to the                            patient.                           - Resume previous diet today.                           - Continue present medications.                           - No aspirin, ibuprofen, naproxen, or other                            non-steroidal anti-inflammatory drugs.                           - Pantoprazole 40 mg by mouth twice a day. Procedure Code(s):        --- Professional ---                           [phone], Esophagogastroduodenoscopy, flexible,                            transoral; with transendoscopic balloon dilation of                             esophagus (less than 30 mm diameter) Diagnosis Code(s):        --- Professional ---                           K22.2, Esophageal obstruction                           K21.0, Gastro-esophageal reflux disease with                            esophagitis                           K44.9, Diaphragmatic hernia without obstruction or                            gangrene  K25.9, Gastric ulcer, unspecified as acute or                            chronic, without hemorrhage or perforation                           K76.6, Portal hypertension                           K31.89, Other diseases of stomach and duodenum                           K31.5, Obstruction of duodenum                           R13.14, Dysphagia, pharyngoesophageal phase CPT copyright 2016 American Medical Association. All rights reserved. The codes documented in this report are preliminary and upon coder review may  be revised to meet current compliance requirements. Hildred Laser, MD Hildred Laser, MD 02/17/2017 10:56:57 AM This report has been signed electronically. Number of Addenda: 0

## 2017-02-17 NOTE — H&P (Signed)
Kathleen Valenzuela is an 66 y.o. female.   Chief Complaint:  Patient is here for EGDndED HPI:  Patient is 66 year old Caucasian female wltpe roble nudinggErD & history of esophageal stricture who is  Here for repeat esophageal dilation. Most recent dilation was on 01/06/2017 when she was noted to have high-gade stricture and only dilated to 15 mm. She says she is maybe 50% better.  She is try not to eat meats.she had one episode of food impaction since last dilation. Her daughter states she has not been feeling well since she was begun on U medication for bipolar disorder. She tends to sleep easily. She complains of exertional dyspnea. She is using nap daily. She has an appointment to see Dr. Manuella Ghazi in 2 weeks.  Past Medical History:  Diagnosis Date  . Anemia   . Anxiety   . Arthritis   . Bipolar 1 disorder ([place])   . Chronic back pain   . COPD (chronic obstructive pulmonary disease) ([place])   . Duodenal stricture   . Early onset Alzheimer's dementia   . GERD (gastroesophageal reflux disease)   . Heart murmur   . High cholesterol   . Hypertension   . Shortness of breath dyspnea     Past Surgical History:  Procedure Laterality Date  . BIOPSY  01/06/2017   Procedure: BIOPSY;  Surgeon: Rogene Houston, MD;  Location: [facility];  Service: Endoscopy;;  esophageal biopsy  . CESAREAN SECTION    . COLONOSCOPY    . ESOPHAGEAL DILATION N/A 10/29/2015   Procedure: ESOPHAGEAL DILATION;  Surgeon: Rogene Houston, MD;  Location: [facility];  Service: Endoscopy;  Laterality: N/A;  . ESOPHAGEAL DILATION N/A 01/29/2016   Procedure: ESOPHAGEAL DILATION;  Surgeon: Rogene Houston, MD;  [facility]: [facility];  Service: Endoscopy;  Laterality: N/A;  . ESOPHAGEAL DILATION N/A 01/06/2017   Procedure: ESOPHAGEAL DILATION;  Surgeon: Rogene Houston, MD;  [facility];  Service: Endoscopy;  Laterality: N/A;  . ESOPHAGOGASTRODUODENOSCOPY N/A 10/29/2015   Procedure: ESOPHAGOGASTRODUODENOSCOPY (EGD);   Surgeon: Rogene Houston, MD;  [facility]: [facility];  Service: Endoscopy;  Laterality: N/A;  1200  . ESOPHAGOGASTRODUODENOSCOPY (EGD) WITH PROPOFOL N/A 01/29/2016   Procedure: ESOPHAGOGASTRODUODENOSCOPY (EGD) WITH PROPOFOL;  Surgeon: Rogene Houston, MD;  [facility]: [facility];  Service: Endoscopy;  Laterality: N/A;  7:30 - moved to 4/21 @11 : 25 - Ann notified pt to arrive at 10:00  . ESOPHAGOGASTRODUODENOSCOPY (EGD) WITH PROPOFOL N/A 01/06/2017   Procedure: ESOPHAGOGASTRODUODENOSCOPY (EGD) WITH PROPOFOL;  Surgeon: Rogene Houston, MD;  [facility]: [facility];  Service: Endoscopy;  Laterality: N/A;  11:20  . FOOT SURGERY Right    bunionectomy  . HEMORRHOID SURGERY      History reviewed. No pertinent family history. Social History:  reports that she quit smoking about 18 months ago. Her smoking use included Cigarettes. She has a 70.00 pack-year smoking history. She has never used smokeless tobacco. She reports that she does not drink alcohol or use drugs.  Allergies:  Allergies  Allergen Reactions  . Penicillins Rash    Per mar    Medications Prior to Admission  Medication Sig Dispense Refill  . albuterol (PROVENTIL HFA;VENTOLIN HFA) 108 (90 BASE) MCG/ACT inhaler Inhale 2 puffs into the lungs 4 (four) times daily. (0800, 1200, 1600, & 2000)    . ALPRAZolam (XANAX) 1 MG tablet Take 1 mg by mouth 3 (three) times daily. (0800, 1400, & 2000)    . atorvastatin (  LIPITOR) 10 MG tablet Take 10 mg by mouth daily at 8 pm.     . calcium carbonate (TUMS - DOSED IN MG ELEMENTAL CALCIUM) 500 MG chewable tablet Chew 2 tablets by mouth every 2 (two) hours as needed for indigestion (CHEW BEFORE SWALLOWING).    Marland Kitchen dicyclomine (BENTYL) 20 MG tablet Take 20 mg by mouth 4 (four) times daily. (0800, 1200, 1600, & 2000)    . docusate sodium (COLACE) 100 MG capsule Take 100 mg by mouth 2 (two) times daily. (0800 & 2000)    . donepezil (ARICEPT) 10 MG tablet Take 10 mg by mouth daily at 8 pm.    . Ferrous  Gluconate 324 (37.5 Fe) MG TABS Take 324 mg by mouth 2 (two) times daily. (0800 & 2000)    . fluticasone (FLONASE) 50 MCG/ACT nasal spray Place 1 spray into both nostrils daily. (0800)    . Fluticasone Furoate-Vilanterol (BREO ELLIPTA) 200-25 MCG/INH AEPB Inhale 1 puff into the lungs daily. (0800)    . furosemide (LASIX) 40 MG tablet Take 40 mg by mouth daily. (0800)    . ipratropium (ATROVENT) 0.02 % nebulizer solution Take 0.5 mg by nebulization every 8 (eight) hours as needed for shortness of breath.    . isosorbide mononitrate (IMDUR) 30 MG 24 hr tablet Take 30 mg by mouth daily. (0800)    . lithium 300 MG tablet Take 300 mg by mouth 2 (two) times daily. (0800 & 2000)    . lurasidone (LATUDA) 40 MG TABS tablet Take 40 mg by mouth daily. (0800)    . Menthol-Zinc Oxide (RISAMINE) 0.44-20.625 % OINT Apply 1 application topically 4 (four) times daily as needed (for rash/redness).    . metoprolol succinate (TOPROL-XL) 50 MG 24 hr tablet Take 50 mg by mouth daily. (0800)Take with or immediately following a meal.    . montelukast (SINGULAIR) 10 MG tablet Take 10 mg by mouth daily. (0800)    . nystatin (NYSTATIN) powder Apply 1 g topically every 8 (eight) hours as needed (for yeast/rash under breast & groin area(s)).    Marland Kitchen Olopatadine HCl (PATADAY) 0.2 % SOLN Place 1 drop into both eyes daily. (0800)    . oxyCODONE (OXYCONTIN) 20 mg 12 hr tablet Take 20 mg by mouth every 12 (twelve) hours. (0800 & 2000)    . Oxycodone HCl 10 MG TABS Take 10 mg by mouth every 6 (six) hours as needed for pain.    . polyethylene glycol (MIRALAX / GLYCOLAX) packet Take 17 g by mouth daily as needed (FOR CONSTIPATION).     Marland Kitchen potassium chloride (K-DUR,KLOR-CON) 10 MEQ tablet Take 10 mEq by mouth daily. (0800)    . tiZANidine (ZANAFLEX) 2 MG tablet Take 2 mg by mouth 2 (two) times daily. (0800 & 2000)    . traZODone (DESYREL) 50 MG tablet Take 50 mg by mouth daily at 8 pm.     . VOLTAREN 1 % GEL Apply 4 g topically every 12  (twelve) hours as needed. For knee pain      No results found for this or any previous visit (from the past 48 hour(s)). No results found.  ROS  Blood pressure 137/67, pulse 69, temperature 97.4 F (36.3 C), temperature source Oral, resp. rate 16, SpO2 95 %. Physical Exam  Constitutional: She appears well-developed and well-nourished.  HENT:  Mouth/Throat: Oropharynx is clear and moist.  Patient is edentulous. She has dentures at home.  Eyes: Conjunctivae are normal. No scleral icterus.  Neck: No  thyromegaly present.  Cardiovascular: Normal rate, regular rhythm and normal heart sounds.   No murmur heard. Respiratory: Effort normal.  Breath sounds are diminished bilaterally. No rales or rhonchi noted.  GI:  Abdomen is full but soft and nontender without organomegaly or masses.  Musculoskeletal: She exhibits no edema.  Neurological: She is alert.  Skin: Skin is warm and dry.     Assessment/Plan Distal esophageal stricture resulting in dysphagia. EGD with ED under monitored anesthesia care.  Hildred Laser, MD 02/17/2017, 10:06 AM

## 2017-02-20 ENCOUNTER — Encounter (INDEPENDENT_AMBULATORY_CARE_PROVIDER_SITE_OTHER): Payer: Self-pay | Admitting: Internal Medicine

## 2017-02-22 ENCOUNTER — Encounter (HOSPITAL_COMMUNITY): Payer: Self-pay | Admitting: Internal Medicine

## 2017-02-23 DIAGNOSIS — I10 Essential (primary) hypertension: Secondary | ICD-10-CM | POA: Diagnosis not present

## 2017-02-23 DIAGNOSIS — F3132 Bipolar disorder, current episode depressed, moderate: Secondary | ICD-10-CM | POA: Diagnosis not present

## 2017-03-02 DIAGNOSIS — Z79899 Other long term (current) drug therapy: Secondary | ICD-10-CM | POA: Diagnosis not present

## 2017-03-02 DIAGNOSIS — J449 Chronic obstructive pulmonary disease, unspecified: Secondary | ICD-10-CM | POA: Diagnosis not present

## 2017-03-02 DIAGNOSIS — Z6836 Body mass index (BMI) 36.0-36.9, adult: Secondary | ICD-10-CM | POA: Diagnosis not present

## 2017-03-02 DIAGNOSIS — K449 Diaphragmatic hernia without obstruction or gangrene: Secondary | ICD-10-CM | POA: Diagnosis not present

## 2017-03-02 DIAGNOSIS — I10 Essential (primary) hypertension: Secondary | ICD-10-CM | POA: Diagnosis not present

## 2017-03-02 DIAGNOSIS — F0390 Unspecified dementia without behavioral disturbance: Secondary | ICD-10-CM | POA: Diagnosis not present

## 2017-03-02 DIAGNOSIS — F3132 Bipolar disorder, current episode depressed, moderate: Secondary | ICD-10-CM | POA: Diagnosis not present

## 2017-03-02 DIAGNOSIS — M170 Bilateral primary osteoarthritis of knee: Secondary | ICD-10-CM | POA: Diagnosis not present

## 2017-03-02 DIAGNOSIS — E785 Hyperlipidemia, unspecified: Secondary | ICD-10-CM | POA: Diagnosis not present

## 2017-03-02 DIAGNOSIS — Z299 Encounter for prophylactic measures, unspecified: Secondary | ICD-10-CM | POA: Diagnosis not present

## 2017-03-02 DIAGNOSIS — M5136 Other intervertebral disc degeneration, lumbar region: Secondary | ICD-10-CM | POA: Diagnosis not present

## 2017-03-02 DIAGNOSIS — Z789 Other specified health status: Secondary | ICD-10-CM | POA: Diagnosis not present

## 2017-03-02 DIAGNOSIS — F319 Bipolar disorder, unspecified: Secondary | ICD-10-CM | POA: Diagnosis not present

## 2017-03-08 DIAGNOSIS — I10 Essential (primary) hypertension: Secondary | ICD-10-CM | POA: Diagnosis not present

## 2017-03-08 DIAGNOSIS — Z79899 Other long term (current) drug therapy: Secondary | ICD-10-CM | POA: Diagnosis not present

## 2017-03-22 DIAGNOSIS — R9431 Abnormal electrocardiogram [ECG] [EKG]: Secondary | ICD-10-CM | POA: Diagnosis not present

## 2017-03-22 DIAGNOSIS — F419 Anxiety disorder, unspecified: Secondary | ICD-10-CM | POA: Diagnosis not present

## 2017-03-22 DIAGNOSIS — M545 Low back pain: Secondary | ICD-10-CM | POA: Diagnosis not present

## 2017-03-22 DIAGNOSIS — J449 Chronic obstructive pulmonary disease, unspecified: Secondary | ICD-10-CM | POA: Diagnosis not present

## 2017-03-22 DIAGNOSIS — Z7951 Long term (current) use of inhaled steroids: Secondary | ICD-10-CM | POA: Diagnosis not present

## 2017-03-22 DIAGNOSIS — Z886 Allergy status to analgesic agent status: Secondary | ICD-10-CM | POA: Diagnosis not present

## 2017-03-22 DIAGNOSIS — R918 Other nonspecific abnormal finding of lung field: Secondary | ICD-10-CM | POA: Diagnosis not present

## 2017-03-22 DIAGNOSIS — G894 Chronic pain syndrome: Secondary | ICD-10-CM | POA: Diagnosis not present

## 2017-03-22 DIAGNOSIS — Z888 Allergy status to other drugs, medicaments and biological substances status: Secondary | ICD-10-CM | POA: Diagnosis not present

## 2017-03-22 DIAGNOSIS — A419 Sepsis, unspecified organism: Secondary | ICD-10-CM | POA: Diagnosis not present

## 2017-03-22 DIAGNOSIS — K5720 Diverticulitis of large intestine with perforation and abscess without bleeding: Secondary | ICD-10-CM | POA: Diagnosis not present

## 2017-03-22 DIAGNOSIS — F0390 Unspecified dementia without behavioral disturbance: Secondary | ICD-10-CM | POA: Diagnosis not present

## 2017-03-22 DIAGNOSIS — E785 Hyperlipidemia, unspecified: Secondary | ICD-10-CM | POA: Diagnosis not present

## 2017-03-22 DIAGNOSIS — G9341 Metabolic encephalopathy: Secondary | ICD-10-CM | POA: Diagnosis not present

## 2017-03-22 DIAGNOSIS — K449 Diaphragmatic hernia without obstruction or gangrene: Secondary | ICD-10-CM | POA: Diagnosis not present

## 2017-03-22 DIAGNOSIS — B961 Klebsiella pneumoniae [K. pneumoniae] as the cause of diseases classified elsewhere: Secondary | ICD-10-CM | POA: Diagnosis not present

## 2017-03-22 DIAGNOSIS — M810 Age-related osteoporosis without current pathological fracture: Secondary | ICD-10-CM | POA: Diagnosis not present

## 2017-03-22 DIAGNOSIS — R450 Nervousness: Secondary | ICD-10-CM | POA: Diagnosis not present

## 2017-03-22 DIAGNOSIS — K219 Gastro-esophageal reflux disease without esophagitis: Secondary | ICD-10-CM | POA: Diagnosis not present

## 2017-03-22 DIAGNOSIS — F319 Bipolar disorder, unspecified: Secondary | ICD-10-CM | POA: Diagnosis not present

## 2017-03-22 DIAGNOSIS — J9811 Atelectasis: Secondary | ICD-10-CM | POA: Diagnosis not present

## 2017-03-22 DIAGNOSIS — R197 Diarrhea, unspecified: Secondary | ICD-10-CM | POA: Diagnosis not present

## 2017-03-22 DIAGNOSIS — Z87891 Personal history of nicotine dependence: Secondary | ICD-10-CM | POA: Diagnosis not present

## 2017-03-22 DIAGNOSIS — E876 Hypokalemia: Secondary | ICD-10-CM | POA: Diagnosis not present

## 2017-03-22 DIAGNOSIS — Z79899 Other long term (current) drug therapy: Secondary | ICD-10-CM | POA: Diagnosis not present

## 2017-03-22 DIAGNOSIS — R410 Disorientation, unspecified: Secondary | ICD-10-CM | POA: Diagnosis not present

## 2017-03-22 DIAGNOSIS — R1110 Vomiting, unspecified: Secondary | ICD-10-CM | POA: Diagnosis not present

## 2017-03-22 DIAGNOSIS — Z881 Allergy status to other antibiotic agents status: Secondary | ICD-10-CM | POA: Diagnosis not present

## 2017-03-22 DIAGNOSIS — Z880 Allergy status to penicillin: Secondary | ICD-10-CM | POA: Diagnosis not present

## 2017-03-22 DIAGNOSIS — N390 Urinary tract infection, site not specified: Secondary | ICD-10-CM | POA: Diagnosis not present

## 2017-03-22 DIAGNOSIS — R112 Nausea with vomiting, unspecified: Secondary | ICD-10-CM | POA: Diagnosis not present

## 2017-03-23 DIAGNOSIS — E876 Hypokalemia: Secondary | ICD-10-CM | POA: Diagnosis not present

## 2017-03-23 DIAGNOSIS — I10 Essential (primary) hypertension: Secondary | ICD-10-CM | POA: Diagnosis not present

## 2017-03-23 DIAGNOSIS — R112 Nausea with vomiting, unspecified: Secondary | ICD-10-CM | POA: Diagnosis not present

## 2017-03-23 DIAGNOSIS — R410 Disorientation, unspecified: Secondary | ICD-10-CM | POA: Diagnosis not present

## 2017-03-23 DIAGNOSIS — R197 Diarrhea, unspecified: Secondary | ICD-10-CM | POA: Diagnosis not present

## 2017-03-23 DIAGNOSIS — F3132 Bipolar disorder, current episode depressed, moderate: Secondary | ICD-10-CM | POA: Diagnosis not present

## 2017-03-24 DIAGNOSIS — A419 Sepsis, unspecified organism: Secondary | ICD-10-CM | POA: Diagnosis not present

## 2017-03-24 DIAGNOSIS — R9431 Abnormal electrocardiogram [ECG] [EKG]: Secondary | ICD-10-CM | POA: Diagnosis not present

## 2017-03-25 DIAGNOSIS — K449 Diaphragmatic hernia without obstruction or gangrene: Secondary | ICD-10-CM | POA: Diagnosis present

## 2017-03-25 DIAGNOSIS — K5730 Diverticulosis of large intestine without perforation or abscess without bleeding: Secondary | ICD-10-CM | POA: Diagnosis not present

## 2017-03-25 DIAGNOSIS — R109 Unspecified abdominal pain: Secondary | ICD-10-CM | POA: Diagnosis not present

## 2017-03-25 DIAGNOSIS — E785 Hyperlipidemia, unspecified: Secondary | ICD-10-CM | POA: Diagnosis present

## 2017-03-25 DIAGNOSIS — Z7951 Long term (current) use of inhaled steroids: Secondary | ICD-10-CM | POA: Diagnosis not present

## 2017-03-25 DIAGNOSIS — K5720 Diverticulitis of large intestine with perforation and abscess without bleeding: Secondary | ICD-10-CM | POA: Diagnosis present

## 2017-03-25 DIAGNOSIS — B961 Klebsiella pneumoniae [K. pneumoniae] as the cause of diseases classified elsewhere: Secondary | ICD-10-CM | POA: Diagnosis present

## 2017-03-25 DIAGNOSIS — M810 Age-related osteoporosis without current pathological fracture: Secondary | ICD-10-CM | POA: Diagnosis present

## 2017-03-25 DIAGNOSIS — G9341 Metabolic encephalopathy: Secondary | ICD-10-CM | POA: Diagnosis not present

## 2017-03-25 DIAGNOSIS — A419 Sepsis, unspecified organism: Secondary | ICD-10-CM | POA: Diagnosis not present

## 2017-03-25 DIAGNOSIS — J984 Other disorders of lung: Secondary | ICD-10-CM | POA: Diagnosis not present

## 2017-03-25 DIAGNOSIS — K5780 Diverticulitis of intestine, part unspecified, with perforation and abscess without bleeding: Secondary | ICD-10-CM | POA: Diagnosis not present

## 2017-03-25 DIAGNOSIS — Z888 Allergy status to other drugs, medicaments and biological substances status: Secondary | ICD-10-CM | POA: Diagnosis not present

## 2017-03-25 DIAGNOSIS — Z87891 Personal history of nicotine dependence: Secondary | ICD-10-CM | POA: Diagnosis not present

## 2017-03-25 DIAGNOSIS — E876 Hypokalemia: Secondary | ICD-10-CM | POA: Diagnosis not present

## 2017-03-25 DIAGNOSIS — Z79899 Other long term (current) drug therapy: Secondary | ICD-10-CM | POA: Diagnosis not present

## 2017-03-25 DIAGNOSIS — Z886 Allergy status to analgesic agent status: Secondary | ICD-10-CM | POA: Diagnosis not present

## 2017-03-25 DIAGNOSIS — J449 Chronic obstructive pulmonary disease, unspecified: Secondary | ICD-10-CM | POA: Diagnosis present

## 2017-03-25 DIAGNOSIS — Z79891 Long term (current) use of opiate analgesic: Secondary | ICD-10-CM | POA: Diagnosis not present

## 2017-03-25 DIAGNOSIS — I10 Essential (primary) hypertension: Secondary | ICD-10-CM | POA: Diagnosis present

## 2017-03-25 DIAGNOSIS — N390 Urinary tract infection, site not specified: Secondary | ICD-10-CM | POA: Diagnosis present

## 2017-03-25 DIAGNOSIS — K5732 Diverticulitis of large intestine without perforation or abscess without bleeding: Secondary | ICD-10-CM | POA: Diagnosis not present

## 2017-03-25 DIAGNOSIS — Z880 Allergy status to penicillin: Secondary | ICD-10-CM | POA: Diagnosis not present

## 2017-03-25 DIAGNOSIS — G8929 Other chronic pain: Secondary | ICD-10-CM | POA: Diagnosis present

## 2017-03-25 DIAGNOSIS — F319 Bipolar disorder, unspecified: Secondary | ICD-10-CM | POA: Diagnosis present

## 2017-03-25 DIAGNOSIS — J9811 Atelectasis: Secondary | ICD-10-CM | POA: Diagnosis not present

## 2017-04-04 DIAGNOSIS — F3132 Bipolar disorder, current episode depressed, moderate: Secondary | ICD-10-CM | POA: Diagnosis not present

## 2017-04-04 DIAGNOSIS — I10 Essential (primary) hypertension: Secondary | ICD-10-CM | POA: Diagnosis not present

## 2017-04-05 DIAGNOSIS — Z8701 Personal history of pneumonia (recurrent): Secondary | ICD-10-CM | POA: Diagnosis not present

## 2017-04-05 DIAGNOSIS — K5700 Diverticulitis of small intestine with perforation and abscess without bleeding: Secondary | ICD-10-CM | POA: Diagnosis not present

## 2017-04-05 DIAGNOSIS — R531 Weakness: Secondary | ICD-10-CM | POA: Diagnosis not present

## 2017-04-05 DIAGNOSIS — Z79891 Long term (current) use of opiate analgesic: Secondary | ICD-10-CM | POA: Diagnosis not present

## 2017-04-05 DIAGNOSIS — G8929 Other chronic pain: Secondary | ICD-10-CM | POA: Diagnosis not present

## 2017-04-05 DIAGNOSIS — F319 Bipolar disorder, unspecified: Secondary | ICD-10-CM | POA: Diagnosis not present

## 2017-04-05 DIAGNOSIS — M4800 Spinal stenosis, site unspecified: Secondary | ICD-10-CM | POA: Diagnosis not present

## 2017-04-05 DIAGNOSIS — M810 Age-related osteoporosis without current pathological fracture: Secondary | ICD-10-CM | POA: Diagnosis not present

## 2017-04-05 DIAGNOSIS — J449 Chronic obstructive pulmonary disease, unspecified: Secondary | ICD-10-CM | POA: Diagnosis not present

## 2017-04-05 DIAGNOSIS — I10 Essential (primary) hypertension: Secondary | ICD-10-CM | POA: Diagnosis not present

## 2017-04-05 DIAGNOSIS — K589 Irritable bowel syndrome without diarrhea: Secondary | ICD-10-CM | POA: Diagnosis not present

## 2017-04-05 DIAGNOSIS — F419 Anxiety disorder, unspecified: Secondary | ICD-10-CM | POA: Diagnosis not present

## 2017-04-05 DIAGNOSIS — F329 Major depressive disorder, single episode, unspecified: Secondary | ICD-10-CM | POA: Diagnosis not present

## 2017-04-07 DIAGNOSIS — R531 Weakness: Secondary | ICD-10-CM | POA: Diagnosis not present

## 2017-04-07 DIAGNOSIS — G8929 Other chronic pain: Secondary | ICD-10-CM | POA: Diagnosis not present

## 2017-04-07 DIAGNOSIS — K5700 Diverticulitis of small intestine with perforation and abscess without bleeding: Secondary | ICD-10-CM | POA: Diagnosis not present

## 2017-04-07 DIAGNOSIS — J449 Chronic obstructive pulmonary disease, unspecified: Secondary | ICD-10-CM | POA: Diagnosis not present

## 2017-04-07 DIAGNOSIS — Z8701 Personal history of pneumonia (recurrent): Secondary | ICD-10-CM | POA: Diagnosis not present

## 2017-04-07 DIAGNOSIS — F319 Bipolar disorder, unspecified: Secondary | ICD-10-CM | POA: Diagnosis not present

## 2017-04-10 ENCOUNTER — Inpatient Hospital Stay (HOSPITAL_COMMUNITY)
Admission: EM | Admit: 2017-04-10 | Discharge: 2017-04-24 | DRG: 640 | Disposition: A | Discharge status: Nursing Home (Includes ICF) | Payer: Medicare Other | Attending: Nephrology | Admitting: Nephrology

## 2017-04-10 ENCOUNTER — Emergency Department (HOSPITAL_COMMUNITY): Payer: Medicare Other

## 2017-04-10 ENCOUNTER — Encounter (HOSPITAL_COMMUNITY): Payer: Self-pay | Admitting: Emergency Medicine

## 2017-04-10 DIAGNOSIS — T8172XA Complication of vein following a procedure, not elsewhere classified, initial encounter: Secondary | ICD-10-CM | POA: Diagnosis not present

## 2017-04-10 DIAGNOSIS — J449 Chronic obstructive pulmonary disease, unspecified: Secondary | ICD-10-CM | POA: Diagnosis present

## 2017-04-10 DIAGNOSIS — K5700 Diverticulitis of small intestine with perforation and abscess without bleeding: Secondary | ICD-10-CM | POA: Diagnosis not present

## 2017-04-10 DIAGNOSIS — F419 Anxiety disorder, unspecified: Secondary | ICD-10-CM | POA: Diagnosis present

## 2017-04-10 DIAGNOSIS — R339 Retention of urine, unspecified: Secondary | ICD-10-CM | POA: Diagnosis not present

## 2017-04-10 DIAGNOSIS — I700 Atherosclerosis of aorta: Secondary | ICD-10-CM | POA: Diagnosis present

## 2017-04-10 DIAGNOSIS — M545 Low back pain: Secondary | ICD-10-CM | POA: Diagnosis present

## 2017-04-10 DIAGNOSIS — G9341 Metabolic encephalopathy: Secondary | ICD-10-CM | POA: Diagnosis present

## 2017-04-10 DIAGNOSIS — B3731 Acute candidiasis of vulva and vagina: Secondary | ICD-10-CM | POA: Diagnosis present

## 2017-04-10 DIAGNOSIS — E7800 Pure hypercholesterolemia, unspecified: Secondary | ICD-10-CM | POA: Diagnosis present

## 2017-04-10 DIAGNOSIS — K5720 Diverticulitis of large intestine with perforation and abscess without bleeding: Secondary | ICD-10-CM | POA: Diagnosis not present

## 2017-04-10 DIAGNOSIS — I110 Hypertensive heart disease with heart failure: Secondary | ICD-10-CM | POA: Diagnosis present

## 2017-04-10 DIAGNOSIS — J9601 Acute respiratory failure with hypoxia: Secondary | ICD-10-CM | POA: Diagnosis not present

## 2017-04-10 DIAGNOSIS — K219 Gastro-esophageal reflux disease without esophagitis: Secondary | ICD-10-CM | POA: Diagnosis present

## 2017-04-10 DIAGNOSIS — N179 Acute kidney failure, unspecified: Secondary | ICD-10-CM | POA: Diagnosis not present

## 2017-04-10 DIAGNOSIS — B373 Candidiasis of vulva and vagina: Secondary | ICD-10-CM | POA: Diagnosis present

## 2017-04-10 DIAGNOSIS — D72829 Elevated white blood cell count, unspecified: Secondary | ICD-10-CM

## 2017-04-10 DIAGNOSIS — G8929 Other chronic pain: Secondary | ICD-10-CM | POA: Diagnosis present

## 2017-04-10 DIAGNOSIS — Z8711 Personal history of peptic ulcer disease: Secondary | ICD-10-CM

## 2017-04-10 DIAGNOSIS — R112 Nausea with vomiting, unspecified: Secondary | ICD-10-CM | POA: Diagnosis not present

## 2017-04-10 DIAGNOSIS — G300 Alzheimer's disease with early onset: Secondary | ICD-10-CM | POA: Diagnosis present

## 2017-04-10 DIAGNOSIS — R1110 Vomiting, unspecified: Secondary | ICD-10-CM | POA: Diagnosis not present

## 2017-04-10 DIAGNOSIS — R197 Diarrhea, unspecified: Secondary | ICD-10-CM | POA: Diagnosis not present

## 2017-04-10 DIAGNOSIS — Z880 Allergy status to penicillin: Secondary | ICD-10-CM

## 2017-04-10 DIAGNOSIS — R531 Weakness: Secondary | ICD-10-CM | POA: Diagnosis not present

## 2017-04-10 DIAGNOSIS — Z79899 Other long term (current) drug therapy: Secondary | ICD-10-CM

## 2017-04-10 DIAGNOSIS — Z6834 Body mass index (BMI) 34.0-34.9, adult: Secondary | ICD-10-CM

## 2017-04-10 DIAGNOSIS — F319 Bipolar disorder, unspecified: Secondary | ICD-10-CM | POA: Diagnosis present

## 2017-04-10 DIAGNOSIS — R7881 Bacteremia: Secondary | ICD-10-CM | POA: Diagnosis not present

## 2017-04-10 DIAGNOSIS — E876 Hypokalemia: Principal | ICD-10-CM | POA: Diagnosis present

## 2017-04-10 DIAGNOSIS — J811 Chronic pulmonary edema: Secondary | ICD-10-CM | POA: Diagnosis present

## 2017-04-10 DIAGNOSIS — E669 Obesity, unspecified: Secondary | ICD-10-CM | POA: Diagnosis present

## 2017-04-10 DIAGNOSIS — N390 Urinary tract infection, site not specified: Secondary | ICD-10-CM | POA: Diagnosis not present

## 2017-04-10 DIAGNOSIS — K5732 Diverticulitis of large intestine without perforation or abscess without bleeding: Secondary | ICD-10-CM | POA: Diagnosis present

## 2017-04-10 DIAGNOSIS — B9562 Methicillin resistant Staphylococcus aureus infection as the cause of diseases classified elsewhere: Secondary | ICD-10-CM | POA: Diagnosis present

## 2017-04-10 DIAGNOSIS — R739 Hyperglycemia, unspecified: Secondary | ICD-10-CM | POA: Diagnosis present

## 2017-04-10 DIAGNOSIS — F0280 Dementia in other diseases classified elsewhere without behavioral disturbance: Secondary | ICD-10-CM | POA: Diagnosis present

## 2017-04-10 DIAGNOSIS — K630 Abscess of intestine: Secondary | ICD-10-CM | POA: Diagnosis not present

## 2017-04-10 DIAGNOSIS — R0600 Dyspnea, unspecified: Secondary | ICD-10-CM

## 2017-04-10 DIAGNOSIS — I5033 Acute on chronic diastolic (congestive) heart failure: Secondary | ICD-10-CM | POA: Diagnosis not present

## 2017-04-10 DIAGNOSIS — E860 Dehydration: Secondary | ICD-10-CM | POA: Diagnosis present

## 2017-04-10 DIAGNOSIS — I808 Phlebitis and thrombophlebitis of other sites: Secondary | ICD-10-CM | POA: Diagnosis not present

## 2017-04-10 DIAGNOSIS — Z8701 Personal history of pneumonia (recurrent): Secondary | ICD-10-CM | POA: Diagnosis not present

## 2017-04-10 DIAGNOSIS — Z87891 Personal history of nicotine dependence: Secondary | ICD-10-CM

## 2017-04-10 DIAGNOSIS — I2510 Atherosclerotic heart disease of native coronary artery without angina pectoris: Secondary | ICD-10-CM | POA: Diagnosis present

## 2017-04-10 DIAGNOSIS — L0291 Cutaneous abscess, unspecified: Secondary | ICD-10-CM

## 2017-04-10 HISTORY — DX: Unspecified diastolic (congestive) heart failure: I50.30

## 2017-04-10 HISTORY — DX: Atherosclerosis of aorta: I70.0

## 2017-04-10 LAB — CBC WITH DIFFERENTIAL/PLATELET
BASOS PCT: 0 %
Basophils Absolute: 0 10*3/uL (ref 0.0–0.1)
EOS PCT: 1 %
Eosinophils Absolute: 0.2 10*3/uL (ref 0.0–0.7)
HCT: 42.7 % (ref 36.0–46.0)
HEMOGLOBIN: 15 g/dL (ref 12.0–15.0)
LYMPHS ABS: 2.5 10*3/uL (ref 0.7–4.0)
Lymphocytes Relative: 12 %
MCH: 30.9 pg (ref 26.0–34.0)
MCHC: 35.1 g/dL (ref 30.0–36.0)
MCV: 87.9 fL (ref 78.0–100.0)
MONOS PCT: 8 %
Monocytes Absolute: 1.6 10*3/uL — ABNORMAL HIGH (ref 0.1–1.0)
NEUTROS ABS: 16.3 10*3/uL — AB (ref 1.7–7.7)
Neutrophils Relative %: 79 %
PLATELETS: 379 10*3/uL (ref 150–400)
RBC: 4.86 MIL/uL (ref 3.87–5.11)
RDW: 14.4 % (ref 11.5–15.5)
WBC: 20.6 10*3/uL — ABNORMAL HIGH (ref 4.0–10.5)

## 2017-04-10 LAB — COMPREHENSIVE METABOLIC PANEL
ALBUMIN: 4 g/dL (ref 3.5–5.0)
ALT: 13 U/L — ABNORMAL LOW (ref 14–54)
AST: 18 U/L (ref 15–41)
Alkaline Phosphatase: 70 U/L (ref 38–126)
Anion gap: 11 (ref 5–15)
BILIRUBIN TOTAL: 1.2 mg/dL (ref 0.3–1.2)
BUN: 8 mg/dL (ref 6–20)
CO2: 25 mmol/L (ref 22–32)
Calcium: 10.7 mg/dL — ABNORMAL HIGH (ref 8.9–10.3)
Chloride: 101 mmol/L (ref 101–111)
Creatinine, Ser: 0.96 mg/dL (ref 0.44–1.00)
GFR calc Af Amer: >60 mL/min (ref >60)
GFR calc non Af Amer: >60 mL/min (ref >60)
GLUCOSE: 134 mg/dL — AB (ref 65–99)
POTASSIUM: 2.5 mmol/L — AB (ref 3.5–5.1)
SODIUM: 137 mmol/L (ref 135–145)
TOTAL PROTEIN: 7.9 g/dL (ref 6.5–8.1)

## 2017-04-10 LAB — MAGNESIUM: MAGNESIUM: 1.7 mg/dL (ref 1.7–2.4)

## 2017-04-10 MED ORDER — SODIUM CHLORIDE 0.9 % IV BOLUS (SEPSIS)
2000.0000 mL | Freq: Once | INTRAVENOUS | Status: AC
  Administered 2017-04-10: 2000 mL via INTRAVENOUS

## 2017-04-10 MED ORDER — METOCLOPRAMIDE HCL 5 MG/ML IJ SOLN
5.0000 mg | Freq: Once | INTRAMUSCULAR | Status: AC
  Administered 2017-04-10: 5 mg via INTRAVENOUS
  Filled 2017-04-10: qty 2

## 2017-04-10 MED ORDER — IOPAMIDOL (ISOVUE-300) INJECTION 61%
100.0000 mL | Freq: Once | INTRAVENOUS | Status: AC | PRN
  Administered 2017-04-10: 100 mL via INTRAVENOUS

## 2017-04-10 MED ORDER — POTASSIUM CHLORIDE 10 MEQ/100ML IV SOLN
10.0000 meq | Freq: Once | INTRAVENOUS | Status: AC
  Administered 2017-04-10: 10 meq via INTRAVENOUS
  Filled 2017-04-10: qty 100

## 2017-04-10 MED ORDER — ONDANSETRON HCL 4 MG/2ML IJ SOLN
4.0000 mg | Freq: Once | INTRAMUSCULAR | Status: AC
  Administered 2017-04-10: 4 mg via INTRAVENOUS
  Filled 2017-04-10: qty 2

## 2017-04-10 NOTE — ED Triage Notes (Signed)
Pt has been vomiting and having diarrhea since discharged from [facility] 04/04/17. Pt is pt from [facility] in [place].

## 2017-04-10 NOTE — ED Provider Notes (Signed)
[facility] DEPT Provider Note   CSN: [id number] Arrival date & time: 04/10/17  1905 Level V caveat dementia history is obtained from patient and from patient's son who accompanies her    History   Chief Complaint Chief Complaint  Patient presents with  . Emesis    HPI Kathleen Valenzuela is a 66 y.o. female.Patient with vomiting and diarrhea onset 8 or 9 days ago. She denies abdominal pain denies fever denies urinary symptoms. She is nauseated presently. She's vomited "several times today" and had 2 episodes diarrhea. No treatment prior to coming here. No other associated symptoms  HPI  Past Medical History:  Diagnosis Date  . Anemia   . Anxiety   . Arthritis   . Bipolar 1 disorder ([place])   . Chronic back pain   . COPD (chronic obstructive pulmonary disease) ([employer])   . Duodenal stricture   . Early onset Alzheimer's dementia   . GERD (gastroesophageal reflux disease)   . Heart murmur   . High cholesterol   . Hypertension   . Shortness of breath dyspnea     Patient Active Problem List   Diagnosis Date Noted  . Gastric ulcer 01/10/2017  . Esophageal dysphagia 12/01/2016  . Dysphagia 12/01/2016  . Bipolar disorder ([place]) 08/03/2015  . High cholesterol 08/03/2015  . COPD exacerbation ([place]) 08/03/2015  . GERD (gastroesophageal reflux disease) 08/03/2015  . Duodenal stricture 08/03/2015    Past Surgical History:  Procedure Laterality Date  . BIOPSY  01/06/2017   Procedure: BIOPSY;  Surgeon: Rogene Houston, MD;  [facility]: [facility];  Service: Endoscopy;;  esophageal biopsy  . CESAREAN SECTION    . COLONOSCOPY    . ESOPHAGEAL DILATION N/A 10/29/2015   Procedure: ESOPHAGEAL DILATION;  Surgeon: Rogene Houston, MD;  [facility]: [facility];  Service: Endoscopy;  Laterality: N/A;  . ESOPHAGEAL DILATION N/A 01/29/2016   Procedure: ESOPHAGEAL DILATION;  Surgeon: Rogene Houston, MD;  [facility];  Service: Endoscopy;  Laterality: N/A;  . ESOPHAGEAL DILATION N/A  01/06/2017   Procedure: ESOPHAGEAL DILATION;  Surgeon: Rogene Houston, MD;  [facility];  Service: Endoscopy;  Laterality: N/A;  . ESOPHAGOGASTRODUODENOSCOPY N/A 10/29/2015   Procedure: ESOPHAGOGASTRODUODENOSCOPY (EGD);  Surgeon: Rogene Houston, MD;  [facility];  Service: Endoscopy;  Laterality: N/A;  1200  . ESOPHAGOGASTRODUODENOSCOPY (EGD) WITH PROPOFOL N/A 01/29/2016   Procedure: ESOPHAGOGASTRODUODENOSCOPY (EGD) WITH PROPOFOL;  Surgeon: Rogene Houston, MD;  [facility];  Service: Endoscopy;  Laterality: N/A;  7:30 - moved to 4/21 @11 : 25 - Ann notified pt to arrive at 10:00  . ESOPHAGOGASTRODUODENOSCOPY (EGD) WITH PROPOFOL N/A 01/06/2017   Procedure: ESOPHAGOGASTRODUODENOSCOPY (EGD) WITH PROPOFOL;  Surgeon: Rogene Houston, MD;  [facility];  Service: Endoscopy;  Laterality: N/A;  11:20  . ESOPHAGOGASTRODUODENOSCOPY (EGD) WITH PROPOFOL N/A 02/17/2017   Procedure: ESOPHAGOGASTRODUODENOSCOPY (EGD) WITH PROPOFOL;  Surgeon: Rogene Houston, MD;  [facility];  Service: Endoscopy;  Laterality: N/A;  10:30  . FOOT SURGERY Right    bunionectomy  . HEMORRHOID SURGERY      OB History    No data available       Home Medications    Prior to Admission medications   Medication Sig Start Date End Date Taking? Authorizing Provider  albuterol (PROVENTIL HFA;VENTOLIN HFA) 108 (90 BASE) MCG/ACT inhaler Inhale 2 puffs into the lungs 4 (four) times daily. (0800, 1200, 1600, & 2000)    [provider]  ALPRAZolam (XANAX) 1 MG tablet Take 1 mg by mouth 3 (three) times daily. (0800, 1400, & 2000)    [provider]  atorvastatin (LIPITOR) 10 MG tablet Take 10 mg by mouth daily at 8 pm.     [provider]  calcium carbonate (TUMS - DOSED IN MG ELEMENTAL CALCIUM) 500 MG chewable tablet Chew 2 tablets by mouth every 2 (two) hours as needed for indigestion (CHEW BEFORE SWALLOWING).    [provider]  dicyclomine  (BENTYL) 20 MG tablet Take 20 mg by mouth 4 (four) times daily. (0800, 1200, 1600, & 2000)    [provider]  docusate sodium (COLACE) 100 MG capsule Take 100 mg by mouth 2 (two) times daily. (0800 & 2000)    [provider]  donepezil (ARICEPT) 10 MG tablet Take 10 mg by mouth daily at 8 pm.    [provider]  Ferrous Gluconate 324 (37.5 Fe) MG TABS Take 324 mg by mouth 2 (two) times daily. (0800 & 2000)    [provider]  fluticasone (FLONASE) 50 MCG/ACT nasal spray Place 1 spray into both nostrils daily. (0800)    [provider]  Fluticasone Furoate-Vilanterol (BREO ELLIPTA) 200-25 MCG/INH AEPB Inhale 1 puff into the lungs daily. (0800)    [provider]  furosemide (LASIX) 40 MG tablet Take 40 mg by mouth daily. (0800)    [provider]  ipratropium (ATROVENT) 0.02 % nebulizer solution Take 0.5 mg by nebulization every 8 (eight) hours as needed for shortness of breath.    [provider]  isosorbide mononitrate (IMDUR) 30 MG 24 hr tablet Take 30 mg by mouth daily. (0800)    [provider]  lithium 300 MG tablet Take 300 mg by mouth 2 (two) times daily. (0800 & 2000)    [provider]  lurasidone (LATUDA) 40 MG TABS tablet Take 40 mg by mouth daily. (0800)    [provider]  Menthol-Zinc Oxide (RISAMINE) 0.44-20.625 % OINT Apply 1 application topically 4 (four) times daily as needed (for rash/redness).    [provider]  metoprolol succinate (TOPROL-XL) 50 MG 24 hr tablet Take 50 mg by mouth daily. (0800)Take with or immediately following a meal.    [provider]  montelukast (SINGULAIR) 10 MG tablet Take 10 mg by mouth daily. (0800)    [provider]  nystatin (NYSTATIN) powder Apply 1 g topically every 8 (eight) hours as needed (for yeast/rash under breast & groin area(s)).    [provider]  Olopatadine HCl (PATADAY) 0.2 % SOLN Place 1 drop into  both eyes daily. (0800)    [provider]  oxyCODONE (OXYCONTIN) 20 mg 12 hr tablet Take 20 mg by mouth every 12 (twelve) hours. (0800 & 2000)    [provider]  Oxycodone HCl 10 MG TABS Take 10 mg by mouth every 6 (six) hours as needed for pain. 11/29/16   [provider]  pantoprazole (PROTONIX) 40 MG tablet Take 1 tablet (40 mg total) by mouth 2 (two) times daily before a meal. 02/17/17   Rehman, Mechele Dawley, MD  polyethylene glycol (MIRALAX / GLYCOLAX) packet Take 17 g by mouth daily as needed (FOR CONSTIPATION).     [provider]  potassium chloride (K-DUR,KLOR-CON) 10 MEQ tablet Take 10 mEq by mouth daily. (0800) 11/03/16   [provider]  tiZANidine (ZANAFLEX) 2 MG tablet Take 2 mg by mouth 2 (two) times daily. (0800 & 2000)  12/01/16   [provider]  traZODone (DESYREL) 50 MG tablet Take 50 mg by mouth daily at 8 pm.     [provider]  VOLTAREN 1 % GEL Apply 4 g topically every 12 (twelve) hours as needed. For knee pain 11/17/16   [provider]    Family History No family history on file.  Social History Social History  Substance Use Topics  . Smoking status: Former Smoker    Packs/day: 2.00    Years: 35.00    Types: Cigarettes    Quit date: 07/25/2015  . Smokeless tobacco: Never Used     Comment: quit 2 weeks ago (November 2016)  . Alcohol use No     Allergies   Penicillins   Review of Systems Review of Systems  Unable to perform ROS: Dementia  Gastrointestinal: Positive for diarrhea, nausea and vomiting. Negative for abdominal pain.     Physical Exam Updated Vital Signs BP 127/80   Pulse (!) 103   Temp 98.4 F (36.9 C)   Resp 20   Ht 5\' 2"  (1.575 m)   Wt 86.2 kg (190 lb)   SpO2 94%   BMI 34.75 kg/m   Physical Exam  Constitutional: She appears well-developed and well-nourished. No distress.  HENT:  Head: Normocephalic and atraumatic.  Eyes: Pupils are equal, round, and reactive to  light. Conjunctivae are normal.  Neck: Neck supple. No tracheal deviation present. No thyromegaly present.  Cardiovascular: Normal rate and regular rhythm.   No murmur heard. Pulmonary/Chest: Effort normal and breath sounds normal.  Abdominal: Soft. Bowel sounds are normal. She exhibits no distension. There is no tenderness.  Musculoskeletal: Normal range of motion. She exhibits no edema or tenderness.  Neurological: She is alert. Coordination normal.  Skin: Skin is warm and dry. No rash noted.  Psychiatric: She has a normal mood and affect.  Nursing note and vitals reviewed.    ED Treatments / Results  Labs (all labs ordered are listed, but only abnormal results are displayed) Labs Reviewed  COMPREHENSIVE METABOLIC PANEL  CBC WITH DIFFERENTIAL/PLATELET    EKG  EKG Interpretation  Date/Time:  Monday April 10 2017 23:11:58 EDT Ventricular Rate:  94 PR Interval:    QRS Duration: 135 QT Interval:  388 QTC Calculation: 486 R Axis:   -36 Text Interpretation:  Sinus rhythm Prolonged PR interval Left bundle branch block No significant change since last tracing Confirmed by Orlie Dakin [phone]) on 04/10/2017 11:27:33 PM       Radiology No results found.  Procedures Procedures (including critical care time)  Medications Ordered in ED Medications  sodium chloride 0.9 % bolus 2,000 mL (not administered)  metoCLOPramide (REGLAN) injection 5 mg (not administered)    Results for orders placed or performed during the hospital encounter of 04/10/17  Comprehensive metabolic panel  Result Value Ref Range   Sodium 137 135 - 145 mmol/L   Potassium 2.5 (LL) 3.5 - 5.1 mmol/L   Chloride 101 101 - 111 mmol/L   CO2 25 22 - 32 mmol/L   Glucose, Bld 134 (H) 65 - 99 mg/dL   BUN 8 6 - 20 mg/dL   Creatinine, Ser 0.96 0.44 - 1.00 mg/dL   Calcium 10.7 (H) 8.9 - 10.3 mg/dL   Total Protein 7.9 6.5 - 8.1 g/dL   Albumin 4.0 3.5 - 5.0 g/dL   AST 18 15 - 41 U/L   ALT 13 (L) 14 - 54 U/L    Alkaline Phosphatase 70 38 -  126 U/L   Total Bilirubin 1.2 0.3 - 1.2 mg/dL   GFR calc non Af Amer >60 >60 mL/min   GFR calc Af Amer >60 >60 mL/min   Anion gap 11 5 - 15  CBC with Differential/Platelet  Result Value Ref Range   WBC 20.6 (H) 4.0 - 10.5 K/uL   RBC 4.86 3.87 - 5.11 MIL/uL   Hemoglobin 15.0 12.0 - 15.0 g/dL   HCT 42.7 36.0 - 46.0 %   MCV 87.9 78.0 - 100.0 fL   MCH 30.9 26.0 - 34.0 pg   MCHC 35.1 30.0 - 36.0 g/dL   RDW 14.4 11.5 - 15.5 %   Platelets 379 150 - 400 K/uL   Neutrophils Relative % 79 %   Lymphocytes Relative 12 %   Monocytes Relative 8 %   Eosinophils Relative 1 %   Basophils Relative 0 %   Neutro Abs 16.3 (H) 1.7 - 7.7 K/uL   Lymphs Abs 2.5 0.7 - 4.0 K/uL   Monocytes Absolute 1.6 (H) 0.1 - 1.0 K/uL   Eosinophils Absolute 0.2 0.0 - 0.7 K/uL   Basophils Absolute 0.0 0.0 - 0.1 K/uL   RBC Morphology POLYCHROMASIA PRESENT    WBC Morphology ATYPICAL LYMPHOCYTES   Magnesium  Result Value Ref Range   Magnesium 1.7 1.7 - 2.4 mg/dL   Dg Abd Acute W/chest  Result Date: 04/10/2017 CLINICAL DATA:  66 y/o  F; vomiting and diarrhea. EXAM: DG ABDOMEN ACUTE W/ 1V CHEST COMPARISON:  03/24/2017 chest radiograph FINDINGS: Low lung volumes accentuate pulmonary markings. Minor bibasilar atelectasis. No focal consolidation. Normal cardiac silhouette given projection and technique. Aortic atherosclerosis with calcification. Moderate lumbar rotatory levocurvature.  Normal bowel gas pattern. IMPRESSION: Normal bowel gas pattern. Low lung volumes with minor bibasilar atelectasis. Aortic atherosclerosis. Electronically Signed   By: Kristine Garbe M.D.   On: 04/10/2017 21:49   Initial Impression / Assessment and Plan / ED Course  I have reviewed the triage vital signs and the nursing notes.  Pertinent labs & imaging results that were available during my care of the patient were reviewed by me and considered in my medical decision making (see chart for details).   20 5 PM  patient continues to complain of nausea after treatment with intravenous Reglan. IV Zofran ordered.  Pt Signed out to Dr Betsey Holiday at 1215 am  Final Clinical Impressions(s) / ED Diagnoses  Dx#1 nausea vomiting diarrhea #2 hypokalemia Final diagnoses:  None   #3 leukocytosis New Prescriptions New Prescriptions   No medications on file     Orlie Dakin, MD 04/11/17 0021

## 2017-04-10 NOTE — ED Notes (Signed)
CRITICAL VALUE ALERT  Critical Value: K+ 2.5  Date & Time Notied:  04/10/17 2242  Provider Notified: EDP  Orders Received/Actions taken: ack.

## 2017-04-10 NOTE — ED Notes (Signed)
Date and time results received: 04/10/17 2305  Test: K+ Critical Value: 2.5  Name of Provider Notified: Jacubowitz  Orders Received? Or Actions Taken?: N/A

## 2017-04-10 NOTE — ED Notes (Signed)
Pt attempted to provide urine sample but was unable to 

## 2017-04-11 DIAGNOSIS — J9811 Atelectasis: Secondary | ICD-10-CM | POA: Diagnosis not present

## 2017-04-11 DIAGNOSIS — Z4682 Encounter for fitting and adjustment of non-vascular catheter: Secondary | ICD-10-CM | POA: Diagnosis not present

## 2017-04-11 DIAGNOSIS — L0291 Cutaneous abscess, unspecified: Secondary | ICD-10-CM | POA: Diagnosis not present

## 2017-04-11 DIAGNOSIS — N390 Urinary tract infection, site not specified: Secondary | ICD-10-CM | POA: Diagnosis not present

## 2017-04-11 DIAGNOSIS — E7800 Pure hypercholesterolemia, unspecified: Secondary | ICD-10-CM | POA: Diagnosis present

## 2017-04-11 DIAGNOSIS — R079 Chest pain, unspecified: Secondary | ICD-10-CM | POA: Diagnosis not present

## 2017-04-11 DIAGNOSIS — Z880 Allergy status to penicillin: Secondary | ICD-10-CM | POA: Diagnosis not present

## 2017-04-11 DIAGNOSIS — G300 Alzheimer's disease with early onset: Secondary | ICD-10-CM | POA: Diagnosis present

## 2017-04-11 DIAGNOSIS — K5792 Diverticulitis of intestine, part unspecified, without perforation or abscess without bleeding: Secondary | ICD-10-CM | POA: Insufficient documentation

## 2017-04-11 DIAGNOSIS — R1110 Vomiting, unspecified: Secondary | ICD-10-CM | POA: Diagnosis not present

## 2017-04-11 DIAGNOSIS — K219 Gastro-esophageal reflux disease without esophagitis: Secondary | ICD-10-CM | POA: Diagnosis present

## 2017-04-11 DIAGNOSIS — R7881 Bacteremia: Secondary | ICD-10-CM | POA: Diagnosis not present

## 2017-04-11 DIAGNOSIS — K5732 Diverticulitis of large intestine without perforation or abscess without bleeding: Secondary | ICD-10-CM | POA: Diagnosis not present

## 2017-04-11 DIAGNOSIS — J439 Emphysema, unspecified: Secondary | ICD-10-CM

## 2017-04-11 DIAGNOSIS — R197 Diarrhea, unspecified: Secondary | ICD-10-CM | POA: Diagnosis not present

## 2017-04-11 DIAGNOSIS — J42 Unspecified chronic bronchitis: Secondary | ICD-10-CM | POA: Diagnosis not present

## 2017-04-11 DIAGNOSIS — J449 Chronic obstructive pulmonary disease, unspecified: Secondary | ICD-10-CM | POA: Diagnosis present

## 2017-04-11 DIAGNOSIS — I700 Atherosclerosis of aorta: Secondary | ICD-10-CM | POA: Diagnosis not present

## 2017-04-11 DIAGNOSIS — B9562 Methicillin resistant Staphylococcus aureus infection as the cause of diseases classified elsewhere: Secondary | ICD-10-CM | POA: Diagnosis present

## 2017-04-11 DIAGNOSIS — Z87891 Personal history of nicotine dependence: Secondary | ICD-10-CM | POA: Diagnosis not present

## 2017-04-11 DIAGNOSIS — R112 Nausea with vomiting, unspecified: Secondary | ICD-10-CM | POA: Diagnosis not present

## 2017-04-11 DIAGNOSIS — Z6834 Body mass index (BMI) 34.0-34.9, adult: Secondary | ICD-10-CM | POA: Diagnosis not present

## 2017-04-11 DIAGNOSIS — F319 Bipolar disorder, unspecified: Secondary | ICD-10-CM | POA: Diagnosis not present

## 2017-04-11 DIAGNOSIS — J810 Acute pulmonary edema: Secondary | ICD-10-CM | POA: Diagnosis not present

## 2017-04-11 DIAGNOSIS — K5720 Diverticulitis of large intestine with perforation and abscess without bleeding: Secondary | ICD-10-CM | POA: Diagnosis present

## 2017-04-11 DIAGNOSIS — T8172XA Complication of vein following a procedure, not elsewhere classified, initial encounter: Secondary | ICD-10-CM | POA: Diagnosis not present

## 2017-04-11 DIAGNOSIS — I5033 Acute on chronic diastolic (congestive) heart failure: Secondary | ICD-10-CM | POA: Diagnosis not present

## 2017-04-11 DIAGNOSIS — J9600 Acute respiratory failure, unspecified whether with hypoxia or hypercapnia: Secondary | ICD-10-CM | POA: Diagnosis not present

## 2017-04-11 DIAGNOSIS — B373 Candidiasis of vulva and vagina: Secondary | ICD-10-CM | POA: Diagnosis not present

## 2017-04-11 DIAGNOSIS — E876 Hypokalemia: Secondary | ICD-10-CM | POA: Diagnosis not present

## 2017-04-11 DIAGNOSIS — N179 Acute kidney failure, unspecified: Secondary | ICD-10-CM | POA: Diagnosis not present

## 2017-04-11 DIAGNOSIS — E669 Obesity, unspecified: Secondary | ICD-10-CM | POA: Diagnosis not present

## 2017-04-11 DIAGNOSIS — D72829 Elevated white blood cell count, unspecified: Secondary | ICD-10-CM | POA: Diagnosis not present

## 2017-04-11 DIAGNOSIS — K5780 Diverticulitis of intestine, part unspecified, with perforation and abscess without bleeding: Secondary | ICD-10-CM | POA: Diagnosis not present

## 2017-04-11 DIAGNOSIS — G9341 Metabolic encephalopathy: Secondary | ICD-10-CM | POA: Diagnosis present

## 2017-04-11 DIAGNOSIS — F419 Anxiety disorder, unspecified: Secondary | ICD-10-CM | POA: Diagnosis present

## 2017-04-11 DIAGNOSIS — I5032 Chronic diastolic (congestive) heart failure: Secondary | ICD-10-CM | POA: Diagnosis not present

## 2017-04-11 DIAGNOSIS — I10 Essential (primary) hypertension: Secondary | ICD-10-CM | POA: Diagnosis not present

## 2017-04-11 DIAGNOSIS — B999 Unspecified infectious disease: Secondary | ICD-10-CM | POA: Diagnosis not present

## 2017-04-11 DIAGNOSIS — I110 Hypertensive heart disease with heart failure: Secondary | ICD-10-CM | POA: Diagnosis present

## 2017-04-11 DIAGNOSIS — F0280 Dementia in other diseases classified elsewhere without behavioral disturbance: Secondary | ICD-10-CM | POA: Diagnosis present

## 2017-04-11 DIAGNOSIS — J9601 Acute respiratory failure with hypoxia: Secondary | ICD-10-CM | POA: Diagnosis not present

## 2017-04-11 LAB — BASIC METABOLIC PANEL
ANION GAP: 13 (ref 5–15)
ANION GAP: 10 (ref 5–15)
ANION GAP: 5 (ref 5–15)
BUN: 7 mg/dL (ref 6–20)
BUN: 5 mg/dL — ABNORMAL LOW (ref 6–20)
BUN: 7 mg/dL (ref 6–20)
CALCIUM: 10.4 mg/dL — AB (ref 8.9–10.3)
CALCIUM: 9.8 mg/dL (ref 8.9–10.3)
CHLORIDE: 113 mmol/L — AB (ref 101–111)
CO2: 20 mmol/L — AB (ref 22–32)
CO2: 23 mmol/L (ref 22–32)
CO2: 23 mmol/L (ref 22–32)
CREATININE: 1 mg/dL (ref 0.44–1.00)
Calcium: 10.1 mg/dL (ref 8.9–10.3)
Chloride: 104 mmol/L (ref 101–111)
Chloride: 101 mmol/L (ref 101–111)
Creatinine, Ser: 0.85 mg/dL (ref 0.44–1.00)
Creatinine, Ser: 0.8 mg/dL (ref 0.44–1.00)
GFR calc Af Amer: >60 mL/min (ref >60)
GFR calc Af Amer: >60 mL/min (ref >60)
GFR, EST NON AFRICAN AMERICAN: 58 mL/min — AB (ref >60)
GLUCOSE: 130 mg/dL — AB (ref 65–99)
Glucose, Bld: 152 mg/dL — ABNORMAL HIGH (ref 65–99)
Glucose, Bld: 131 mg/dL — ABNORMAL HIGH (ref 65–99)
POTASSIUM: 2.6 mmol/L — AB (ref 3.5–5.1)
POTASSIUM: 5 mmol/L (ref 3.5–5.1)
Potassium: 2.6 mmol/L — CL (ref 3.5–5.1)
SODIUM: 137 mmol/L (ref 135–145)
SODIUM: 141 mmol/L (ref 135–145)
Sodium: 134 mmol/L — ABNORMAL LOW (ref 135–145)

## 2017-04-11 LAB — URINALYSIS, ROUTINE W REFLEX MICROSCOPIC
BACTERIA UA: NONE SEEN
BILIRUBIN URINE: NEGATIVE
Glucose, UA: NEGATIVE mg/dL
HGB URINE DIPSTICK: NEGATIVE
Ketones, ur: NEGATIVE mg/dL
NITRITE: NEGATIVE
PROTEIN: NEGATIVE mg/dL
pH: 7 (ref 5.0–8.0)

## 2017-04-11 LAB — CBC
HCT: 45.8 % (ref 36.0–46.0)
HEMOGLOBIN: 15.8 g/dL — AB (ref 12.0–15.0)
MCH: 30.9 pg (ref 26.0–34.0)
MCHC: 34.5 g/dL (ref 30.0–36.0)
MCV: 89.6 fL (ref 78.0–100.0)
PLATELETS: 278 10*3/uL (ref 150–400)
RBC: 5.11 MIL/uL (ref 3.87–5.11)
RDW: 14.4 % (ref 11.5–15.5)
WBC: 17.6 10*3/uL — AB (ref 4.0–10.5)

## 2017-04-11 LAB — MAGNESIUM: MAGNESIUM: 2.1 mg/dL (ref 1.7–2.4)

## 2017-04-11 LAB — LITHIUM LEVEL: LITHIUM LVL: 0.92 mmol/L (ref 0.60–1.20)

## 2017-04-11 MED ORDER — ISOSORBIDE MONONITRATE ER 60 MG PO TB24
30.0000 mg | ORAL_TABLET | Freq: Every day | ORAL | Status: DC
  Administered 2017-04-11 – 2017-04-13 (×3): 30 mg via ORAL
  Filled 2017-04-11 (×4): qty 1

## 2017-04-11 MED ORDER — DICYCLOMINE HCL 20 MG PO TABS
20.0000 mg | ORAL_TABLET | Freq: Four times a day (QID) | ORAL | Status: DC
  Filled 2017-04-11 (×6): qty 1

## 2017-04-11 MED ORDER — LITHIUM CARBONATE 150 MG PO CAPS
300.0000 mg | ORAL_CAPSULE | ORAL | Status: DC
  Administered 2017-04-11 – 2017-04-17 (×7): 300 mg via ORAL
  Filled 2017-04-11 (×7): qty 2

## 2017-04-11 MED ORDER — ATORVASTATIN CALCIUM 10 MG PO TABS
10.0000 mg | ORAL_TABLET | Freq: Every day | ORAL | Status: DC
  Administered 2017-04-11 – 2017-04-16 (×6): 10 mg via ORAL
  Filled 2017-04-11 (×7): qty 1

## 2017-04-11 MED ORDER — METOPROLOL SUCCINATE ER 50 MG PO TB24
50.0000 mg | ORAL_TABLET | Freq: Every day | ORAL | Status: DC
  Administered 2017-04-11 – 2017-04-13 (×3): 50 mg via ORAL
  Filled 2017-04-11 (×4): qty 1

## 2017-04-11 MED ORDER — POTASSIUM CHLORIDE 10 MEQ/100ML IV SOLN: 10.0000 meq | INTRAVENOUS | Status: DC

## 2017-04-11 MED ORDER — OXYCODONE HCL 5 MG PO TABS
10.0000 mg | ORAL_TABLET | Freq: Three times a day (TID) | ORAL | Status: DC | PRN
  Administered 2017-04-11 – 2017-04-18 (×11): 10 mg via ORAL
  Filled 2017-04-11 (×11): qty 2

## 2017-04-11 MED ORDER — ALBUTEROL SULFATE (2.5 MG/3ML) 0.083% IN NEBU
3.0000 mL | INHALATION_SOLUTION | Freq: Two times a day (BID) | RESPIRATORY_TRACT | Status: DC
  Administered 2017-04-11 – 2017-04-12 (×3): 3 mL via RESPIRATORY_TRACT
  Filled 2017-04-11 (×3): qty 3

## 2017-04-11 MED ORDER — LITHIUM CARBONATE 300 MG PO TABS: 300.0000 mg | ORAL_TABLET | Freq: Two times a day (BID) | ORAL | Status: DC

## 2017-04-11 MED ORDER — PANTOPRAZOLE SODIUM 40 MG PO TBEC
40.0000 mg | DELAYED_RELEASE_TABLET | Freq: Two times a day (BID) | ORAL | Status: DC
  Administered 2017-04-11 – 2017-04-17 (×14): 40 mg via ORAL
  Filled 2017-04-11 (×14): qty 1

## 2017-04-11 MED ORDER — OXYCODONE HCL ER 20 MG PO T12A
20.0000 mg | EXTENDED_RELEASE_TABLET | Freq: Two times a day (BID) | ORAL | Status: DC
  Administered 2017-04-11 – 2017-04-17 (×14): 20 mg via ORAL
  Filled 2017-04-11 (×15): qty 1

## 2017-04-11 MED ORDER — METRONIDAZOLE IN NACL 5-0.79 MG/ML-% IV SOLN
500.0000 mg | Freq: Three times a day (TID) | INTRAVENOUS | Status: DC
  Administered 2017-04-11 – 2017-04-12 (×6): 500 mg via INTRAVENOUS
  Filled 2017-04-11 (×6): qty 100

## 2017-04-11 MED ORDER — POTASSIUM CHLORIDE CRYS ER 20 MEQ PO TBCR
40.0000 meq | EXTENDED_RELEASE_TABLET | Freq: Two times a day (BID) | ORAL | Status: DC
  Administered 2017-04-11: 40 meq via ORAL
  Filled 2017-04-11: qty 2

## 2017-04-11 MED ORDER — CIPROFLOXACIN IN D5W 400 MG/200ML IV SOLN
400.0000 mg | Freq: Two times a day (BID) | INTRAVENOUS | Status: DC
  Administered 2017-04-11 – 2017-04-12 (×4): 400 mg via INTRAVENOUS
  Filled 2017-04-11 (×4): qty 200

## 2017-04-11 MED ORDER — ADULT MULTIVITAMIN W/MINERALS CH
1.0000 | ORAL_TABLET | Freq: Every day | ORAL | Status: DC
  Administered 2017-04-11 – 2017-04-17 (×7): 1 via ORAL
  Filled 2017-04-11 (×7): qty 1

## 2017-04-11 MED ORDER — ONDANSETRON HCL 4 MG PO TABS
4.0000 mg | ORAL_TABLET | Freq: Four times a day (QID) | ORAL | Status: DC | PRN
  Administered 2017-04-13: 4 mg via ORAL
  Filled 2017-04-11 (×2): qty 1

## 2017-04-11 MED ORDER — ALBUTEROL SULFATE (2.5 MG/3ML) 0.083% IN NEBU
2.5000 mg | INHALATION_SOLUTION | Freq: Four times a day (QID) | RESPIRATORY_TRACT | Status: DC | PRN
  Administered 2017-04-19: 2.5 mg via RESPIRATORY_TRACT
  Filled 2017-04-11: qty 3

## 2017-04-11 MED ORDER — DONEPEZIL HCL 5 MG PO TABS
10.0000 mg | ORAL_TABLET | Freq: Every day | ORAL | Status: DC
  Administered 2017-04-11 – 2017-04-17 (×8): 10 mg via ORAL
  Filled 2017-04-11 (×8): qty 2

## 2017-04-11 MED ORDER — FLUCONAZOLE 100 MG PO TABS
150.0000 mg | ORAL_TABLET | Freq: Every day | ORAL | Status: AC
  Administered 2017-04-11 – 2017-04-15 (×5): 150 mg via ORAL
  Filled 2017-04-11 (×5): qty 2

## 2017-04-11 MED ORDER — POTASSIUM CHLORIDE IN NACL 40-0.9 MEQ/L-% IV SOLN
INTRAVENOUS | Status: AC
  Administered 2017-04-11 (×2): 100 mL/h via INTRAVENOUS

## 2017-04-11 MED ORDER — ALBUTEROL SULFATE (2.5 MG/3ML) 0.083% IN NEBU
3.0000 mL | INHALATION_SOLUTION | Freq: Four times a day (QID) | RESPIRATORY_TRACT | Status: DC
  Administered 2017-04-11: 3 mL via RESPIRATORY_TRACT
  Filled 2017-04-11: qty 3

## 2017-04-11 MED ORDER — LITHIUM CARBONATE 150 MG PO CAPS
450.0000 mg | ORAL_CAPSULE | Freq: Every day | ORAL | Status: DC
  Administered 2017-04-11 – 2017-04-17 (×7): 450 mg via ORAL
  Filled 2017-04-11 (×6): qty 3

## 2017-04-11 MED ORDER — ALPRAZOLAM 1 MG PO TABS
1.0000 mg | ORAL_TABLET | Freq: Three times a day (TID) | ORAL | Status: DC
  Administered 2017-04-11 – 2017-04-17 (×20): 1 mg via ORAL
  Filled 2017-04-11 (×21): qty 1

## 2017-04-11 MED ORDER — CLOTRIMAZOLE 1 % VA CREA
1.0000 | TOPICAL_CREAM | Freq: Every day | VAGINAL | Status: AC
  Administered 2017-04-11 – 2017-04-17 (×6): 1 via VAGINAL
  Filled 2017-04-11: qty 45

## 2017-04-11 MED ORDER — ONDANSETRON HCL 4 MG/2ML IJ SOLN
4.0000 mg | Freq: Four times a day (QID) | INTRAMUSCULAR | Status: DC | PRN
  Administered 2017-04-11 – 2017-04-21 (×9): 4 mg via INTRAVENOUS
  Filled 2017-04-11 (×9): qty 2

## 2017-04-11 MED ORDER — LITHIUM CARBONATE 150 MG PO CAPS
150.0000 mg | ORAL_CAPSULE | Freq: Every morning | ORAL | Status: DC
  Filled 2017-04-11: qty 1

## 2017-04-11 MED ORDER — DICYCLOMINE HCL 10 MG PO CAPS
20.0000 mg | ORAL_CAPSULE | Freq: Three times a day (TID) | ORAL | Status: DC
  Administered 2017-04-11 – 2017-04-13 (×10): 20 mg via ORAL
  Filled 2017-04-11 (×10): qty 2

## 2017-04-11 MED ORDER — ENSURE ENLIVE PO LIQD
237.0000 mL | Freq: Two times a day (BID) | ORAL | Status: DC
  Administered 2017-04-11 – 2017-04-14 (×6): 237 mL via ORAL

## 2017-04-11 MED ORDER — POTASSIUM CHLORIDE CRYS ER 10 MEQ PO TBCR
10.0000 meq | EXTENDED_RELEASE_TABLET | Freq: Every day | ORAL | Status: DC
  Filled 2017-04-11: qty 1

## 2017-04-11 MED ORDER — POTASSIUM CHLORIDE CRYS ER 20 MEQ PO TBCR
40.0000 meq | EXTENDED_RELEASE_TABLET | ORAL | Status: AC
  Administered 2017-04-11 (×2): 40 meq via ORAL
  Filled 2017-04-11 (×2): qty 2

## 2017-04-11 MED ORDER — MAGNESIUM SULFATE 2 GM/50ML IV SOLN
2.0000 g | Freq: Once | INTRAVENOUS | Status: AC
  Administered 2017-04-11: 2 g via INTRAVENOUS
  Filled 2017-04-11: qty 50

## 2017-04-11 MED ORDER — TRAZODONE HCL 50 MG PO TABS
50.0000 mg | ORAL_TABLET | Freq: Every day | ORAL | Status: DC
  Administered 2017-04-11 – 2017-04-17 (×7): 50 mg via ORAL
  Filled 2017-04-11 (×7): qty 1

## 2017-04-11 MED ORDER — CALCIUM CARBONATE ANTACID 500 MG PO CHEW: 2.0000 | CHEWABLE_TABLET | ORAL | Status: DC | PRN

## 2017-04-11 NOTE — Progress Notes (Signed)
Have seen and assessed the patient and agree with Dr. Camelia Eng assessment and plan. Patient is a 65 year old female recently treated at [facility] for acute diverticulitis with abscess status post percutaneous drainage of abscess and discharged on oral ciprofloxacin and Flagyl to skilled nursing facility. While at skilled nursing facility patient started having diarrhea and vomiting and inability to keep anything down labs obtained showed severe hypokalemia with a potassium of 2.2. Patient complaining of nausea however tolerating full liquid diet and no bowel movement as yet. Patient also complaining of a vaginal yeast infection. Patient given Diflucan 150 mg by mouth 1 as well as miconazole cream 7 days. Stool studies pending. Continue current empiric IV antibiotics. Supportive care. Keep magnesium. And 2. Replete potassium.

## 2017-04-11 NOTE — Progress Notes (Signed)
Initial Nutrition Assessment  DOCUMENTATION CODES:  Obesity unspecified  INTERVENTION:  Continue Ensure Enlive po BID, each supplement provides 350 kcal and 20 grams of protein-vanilla only  MVI with minerals (if can tolerate)  Monitor PO intake/diet tolerance  NUTRITION DIAGNOSIS:  Inadequate oral intake related to acute illness, nausea, vomiting, poor appetite as evidenced by per patient report of not having a standard meal for weeks and a oss of 7.4% bw in just over 3 months. ,  GOAL:  Patient will meet greater than or equal to 90% of their needs  MONITOR:  PO intake, Supplement acceptance, Diet advancement, Labs, Weight trends  REASON FOR ASSESSMENT:  Malnutrition Screening Tool    ASSESSMENT:  66 y/o female PMHx bipolar disorder, early dementia, HTN, GERD, Anxiety, COPD. Recently admitted for 2 weeks at OSH due to diverticulitis w/ abscess s/p perc drainage. D/Cd from hospital 7/31, however developed N/V/D 3 days later and could not keep food/drink down. Presented to the ED and was found to be hypokalemic. Admitted for evaluation of possible CDiff.   Pt reports that for the past few weeks she has not been eating meals at all, rather just jello, soup or other small items. At baseline, she has been a resident at a facility for 4 years. At the facility, she is not on any type of therapeutic diet, does not receive any oral supplements and does not take vitamins/minerals.   She does not know her UBW. Per chart, she appears to have been weighing ~200 lbs in March/April. She has lost ~10-15 lbs since then, which is just shy of being clinically significant for the timeframe.   At this time, she still reports a very poor appetite. She could not mention a single food that sounded good to her. She is still nauseated. SHe received medication for this to some relief. Noted she did have Ensure at the side of her bed. She says she can drink the vanilla ones and stated these dont make her nausea  worse. Continue these for now and monitor for diet improvement. Given prolonged poor PO intake and no supplementation, would benefit from addition of MVI  Physical Exam; WDL. Obese  Labs: NA: 134, K:2.6, MRSA + Meds: Ensure, Xanax, Aricept, KCL, PPI, Cipro, IV abx,    Recent Labs Lab 04/10/17 2200 04/11/17 0247 04/11/17 0905  NA 137 137 134*  K 2.5* 2.6* 2.6*  CL 101 104 101  CO2 25 20* 23  BUN 8 7 5*  CREATININE 0.96 1.00 0.85  CALCIUM 10.7* 10.4* 9.8  MG 1.7  --  2.1  GLUCOSE 134* 152* 130*   Diet Order:  Diet full liquid Room service appropriate? Yes; Fluid consistency: Thin  Skin:  Reviewed, no issues  Last BM:  8/6  Height:  Ht Readings from Last 1 Encounters:  04/10/17 5\' 2"  (1.575 m)   Weight:  Wt Readings from Last 1 Encounters:  04/11/17 189 lb 3.2 oz (85.8 kg)   Wt Readings from Last 10 Encounters:  04/11/17 189 lb 3.2 oz (85.8 kg)  01/02/17 204 lb (92.5 kg)  12/01/16 198 lb 1.6 oz (89.9 kg)  12/23/15 172 lb (78 kg)  10/29/15 162 lb (73.5 kg)  08/03/15 162 lb 14.4 oz (73.9 kg)   Ideal Body Weight:  50 kg  BMI:  Body mass index is 34.61 kg/m.  Estimated Nutritional Needs:  Kcal:  1550-1700 (18-20 kcal/kg bw) Protein:  60-70 g pro (1.2-1.4 g/kg ibw) Fluid:  1.5-1.7 L fluid + Enough to  replace stool losses  EDUCATION NEEDS:  No education needs identified at this time  Burtis Junes RD, LDN, [place] [facility] Pager: [phone] 04/11/2017 1:01 PM

## 2017-04-11 NOTE — Progress Notes (Addendum)
CRITICAL VALUE ALERT  Critical Value: 2.6  Date & Time Notied:  10:05  Provider Notified: 10:08  Orders Received/Actions taken: 10:20

## 2017-04-11 NOTE — Progress Notes (Signed)
Pt refused K+ runs. She said it hurt too much. Offered to slow rate but pt still declined. Spoke with MD. Switched to PO. Pt also refused SCDs.

## 2017-04-11 NOTE — Progress Notes (Signed)
MD informed that patient tested positive for MRSA, patient place on contact isolation.

## 2017-04-11 NOTE — ED Notes (Signed)
Report given to [place], RN [phone]

## 2017-04-11 NOTE — ED Provider Notes (Signed)
Patient signed out to me by Dr. Cathleen Fears to follow-up on CT scan. Patient seen with nausea, vomiting and diarrhea. Patient has a complex recent past medical history. She was admitted to [facility] a week ago. She was discharged on July 31. She was apparently treated for diverticular abscess with percutaneous drainage and antibiotic therapy. Since she left the hospital she has had severe nausea, vomiting and diarrhea. She is currently living in assisted living.  Patient reports that she is still on antibiotics. It appears that she is currently on Cipro and Flagyl. Workup revealed significant leukocytosis. She also has profound hypokalemia. CT scan did not show any significant colitis, including pancolitis such as C. difficile colitis. She does have persistent abscess but it is smaller than previous abscess, presumably responding to therapy. Leukocytosis of unclear etiology. It may be secondary to the abscess, but I am concerned about the possibility of C. difficile colitis despite no findings on CT scan. Will collect stool sample. [facility]. Patient will require hospitalization for IV hydration and potassium repletion.   Orpah Greek, MD 04/11/17 [phone]

## 2017-04-11 NOTE — H&P (Signed)
History and Physical    Kathleen Valenzuela:[id number] DOB: 1950-09-21 DOA: 04/10/2017  PCP: Patient, No Pcp Per  Patient coming from:  snf  Chief Complaint:   N/v/d  HPI: Kathleen Valenzuela is a 66 y.o. female with medical history significant of bipolar disorder, early dementia (she and her son deny this but she is on aricept and its in her chart), copd, htn with recent 2 week hospitalization at [facility] for acute diverticulitis with absess s/p percutaneous drainage of absess.  D/c from there on 04/04/17 with cipro and flagyl orally.  When she went home she was eating okay but not back to normal eating.  She had no diarrhea. She had no pain.  About 3 days being at SNF she started having the diarrhea and vomiting.  She has not been able to keep anything down for days due to nausea all the time.  Denies fevers.  Has had no abdominal pain.  She thinks she is still on the oral abx but is not sure as she does not give herself her meds, the snf does.  Pt found to have a k of 2.2 and referred for admission for evaluation of possible cdiff.   Review of Systems: As per HPI otherwise 10 point review of systems negative.   Past Medical History:  Diagnosis Date  . Anemia   . Anxiety   . Arthritis   . Bipolar 1 disorder ([place])   . Chronic back pain   . COPD (chronic obstructive pulmonary disease) ([place])   . Duodenal stricture   . Early onset Alzheimer's dementia   . GERD (gastroesophageal reflux disease)   . Heart murmur   . High cholesterol   . Hypertension   . Shortness of breath dyspnea     Past Surgical History:  Procedure Laterality Date  . BIOPSY  01/06/2017   Procedure: BIOPSY;  Surgeon: Rogene Houston, MD;  [facility];  Service: Endoscopy;;  esophageal biopsy  . CESAREAN SECTION    . COLONOSCOPY    . ESOPHAGEAL DILATION N/A 10/29/2015   Procedure: ESOPHAGEAL DILATION;  Surgeon: Rogene Houston, MD;  [facility];  Service: Endoscopy;  Laterality: N/A;  . ESOPHAGEAL  DILATION N/A 01/29/2016   Procedure: ESOPHAGEAL DILATION;  Surgeon: Rogene Houston, MD;  [facility];  Service: Endoscopy;  Laterality: N/A;  . ESOPHAGEAL DILATION N/A 01/06/2017   Procedure: ESOPHAGEAL DILATION;  Surgeon: Rogene Houston, MD;  [facility];  Service: Endoscopy;  Laterality: N/A;  . ESOPHAGOGASTRODUODENOSCOPY N/A 10/29/2015   Procedure: ESOPHAGOGASTRODUODENOSCOPY (EGD);  Surgeon: Rogene Houston, MD;  [facility];  Service: Endoscopy;  Laterality: N/A;  1200  . ESOPHAGOGASTRODUODENOSCOPY (EGD) WITH PROPOFOL N/A 01/29/2016   Procedure: ESOPHAGOGASTRODUODENOSCOPY (EGD) WITH PROPOFOL;  Surgeon: Rogene Houston, MD;  [facility];  Service: Endoscopy;  Laterality: N/A;  7:30 - moved to 4/21 @11 : 25 - Ann notified pt to arrive at 10:00  . ESOPHAGOGASTRODUODENOSCOPY (EGD) WITH PROPOFOL N/A 01/06/2017   Procedure: ESOPHAGOGASTRODUODENOSCOPY (EGD) WITH PROPOFOL;  Surgeon: Rogene Houston, MD;  [facility];  Service: Endoscopy;  Laterality: N/A;  11:20  . ESOPHAGOGASTRODUODENOSCOPY (EGD) WITH PROPOFOL N/A 02/17/2017   Procedure: ESOPHAGOGASTRODUODENOSCOPY (EGD) WITH PROPOFOL;  Surgeon: Rogene Houston, MD;  [facility];  Service: Endoscopy;  Laterality: N/A;  10:30  . FOOT SURGERY Right    bunionectomy  . HEMORRHOID SURGERY       reports that  she quit smoking about 20 months ago. Her smoking use included Cigarettes. She has a 70.00 pack-year smoking history. She has never used smokeless tobacco. She reports that she does not drink alcohol or use drugs.  Allergies  Allergen Reactions  . Penicillins Rash    Has patient had a PCN reaction causing immediate rash, facial/tongue/throat swelling, SOB or lightheadedness with hypotension: Yes Has patient had a PCN reaction causing severe rash involving mucus membranes or skin necrosis: No Has patient had a PCN reaction that required hospitalization: No Has patient had a PCN  reaction occurring within the last 10 years: No If all of the above answers are "NO", then may proceed with Cephalosporin use.     No family history on file. no premature CAD  Prior to Admission medications   Medication Sig Start Date End Date Taking? Authorizing Provider  albuterol (PROVENTIL HFA;VENTOLIN HFA) 108 (90 BASE) MCG/ACT inhaler Inhale 2 puffs into the lungs 4 (four) times daily. (0800, 1200, 1600, & 2000)   Yes [provider]  ALPRAZolam (XANAX) 1 MG tablet Take 1 mg by mouth 3 (three) times daily. (0800, 1400, & 2000)   Yes [provider]  atorvastatin (LIPITOR) 10 MG tablet Take 10 mg by mouth daily at 8 pm.    Yes [provider]  calcium carbonate (TUMS - DOSED IN MG ELEMENTAL CALCIUM) 500 MG chewable tablet Chew 2 tablets by mouth every 2 (two) hours as needed for indigestion (CHEW BEFORE SWALLOWING).   Yes [provider]  ciprofloxacin (CIPRO) 500 MG tablet Take 500 mg by mouth 2 (two) times daily.   Yes [provider]  dicyclomine (BENTYL) 20 MG tablet Take 20 mg by mouth 4 (four) times daily. (0800, 1200, 1600, & 2000)   Yes [provider]  docusate sodium (COLACE) 100 MG capsule Take 100 mg by mouth 2 (two) times daily. (0800 & 2000)   Yes [provider]  donepezil (ARICEPT) 10 MG tablet Take 10 mg by mouth daily at 8 pm.   Yes [provider]  Ferrous Gluconate 324 (37.5 Fe) MG TABS Take 324 mg by mouth 2 (two) times daily. (0800 & 2000)   Yes [provider]  fluticasone (FLONASE) 50 MCG/ACT nasal spray Place 1 spray into both nostrils daily. (0800)   Yes [provider]  Fluticasone Furoate-Vilanterol (BREO ELLIPTA) 200-25 MCG/INH AEPB Inhale 1 puff into the lungs daily. (0800)   Yes [provider]  furosemide (LASIX) 40 MG tablet Take 40 mg by mouth daily. (0800)   Yes [provider]  isosorbide mononitrate (IMDUR) 30 MG 24 hr tablet Take 30 mg by mouth  daily. (0800)   Yes [provider]  lithium 300 MG tablet Take 300 mg by mouth 2 (two) times daily. (0800 & 2000)   Yes [provider]  lithium carbonate 150 MG capsule Take 150 mg by mouth every morning.   Yes [provider]  Menthol-Zinc Oxide (RISAMINE) 0.44-20.625 % OINT Apply 1 application topically 4 (four) times daily as needed (for rash/redness).   Yes [provider]  metoprolol succinate (TOPROL-XL) 50 MG 24 hr tablet Take 50 mg by mouth daily. (0800)Take with or immediately following a meal.   Yes [provider]  metroNIDAZOLE (FLAGYL) 500 MG tablet Take 500 mg by mouth 3 (three) times daily.   Yes [provider]  Olopatadine HCl (PATADAY) 0.2 % SOLN Place 1 drop into both eyes daily. (0800)   Yes [provider]  oxyCODONE (OXYCONTIN) 20 mg 12 hr tablet Take 20 mg by mouth every 12 (twelve) hours. (0800 & 2000)   Yes [provider]  Oxycodone HCl 10 MG TABS Take 10 mg by mouth every 8 (eight) hours as needed (FOR Pain).  11/29/16  Yes [provider]  pantoprazole (PROTONIX) 40 MG tablet Take 1 tablet (40 mg total) by mouth 2 (two) times daily before a meal. 02/17/17  Yes Rehman, Mechele Dawley, MD  polyethylene glycol (MIRALAX / GLYCOLAX) packet Take 17 g by mouth daily as needed (FOR CONSTIPATION).    Yes [provider]  potassium chloride (K-DUR,KLOR-CON) 10 MEQ tablet Take 10 mEq by mouth daily. (0800) 11/03/16  Yes [provider]  traZODone (DESYREL) 50 MG tablet Take 50 mg by mouth daily at 8 pm.    Yes [provider]  VOLTAREN 1 % GEL Apply 4 g topically every 12 (twelve) hours as needed. For knee pain 11/17/16  Yes [provider]  tiZANidine (ZANAFLEX) 2 MG tablet Take 2 mg by mouth 2 (two) times daily. (0800 & 2000) 12/01/16   [provider]    Physical Exam: Vitals:   04/10/17 2158 04/10/17 2230 04/10/17 2300 04/11/17 0211  BP: 129/76 (!) 125/59 (!)  142/75 (!) 141/75  Pulse: 93 95 94 90  Resp: 17 17 17 20   Temp:      SpO2: 98% 94% 95% 96%  Weight:      Height:          Constitutional: NAD, calm, comfortable Vitals:   04/10/17 2158 04/10/17 2230 04/10/17 2300 04/11/17 0211  BP: 129/76 (!) 125/59 (!) 142/75 (!) 141/75  Pulse: 93 95 94 90  Resp: 17 17 17 20   Temp:      SpO2: 98% 94% 95% 96%  Weight:      Height:       Eyes: PERRL, lids and conjunctivae normal ENMT: Mucous membranes are moist. Posterior pharynx clear of any exudate or lesions.Normal dentition.  Neck: normal, supple, no masses, no thyromegaly Respiratory: clear to auscultation bilaterally, no wheezing, no crackles. Normal respiratory effort. No accessory muscle use.  Cardiovascular: Regular rate and rhythm, no murmurs / rubs / gallops. No extremity edema. 2+ pedal pulses. No carotid bruits.  Abdomen: no tenderness, no masses palpated. No hepatosplenomegaly. Bowel sounds positive.  Musculoskeletal: no clubbing / cyanosis. No joint deformity upper and lower extremities. Good ROM, no contractures. Normal muscle tone.  Skin: no rashes, lesions, ulcers. No induration Neurologic: CN 2-12 grossly intact. Sensation intact, DTR normal. Strength 5/5 in all 4.  Psychiatric: Normal judgment and insight. Alert and oriented x 3. Normal mood.    Labs on Admission: I have personally reviewed following labs and imaging studies  CBC:  Recent Labs Lab 04/10/17 2200  WBC 20.6*  NEUTROABS 16.3*  HGB 15.0  HCT 42.7  MCV 87.9  PLT 244   Basic Metabolic Panel:  Recent Labs Lab 04/10/17 2200  NA 137  K 2.5*  CL 101  CO2 25  GLUCOSE 134*  BUN 8  CREATININE 0.96  CALCIUM 10.7*  MG 1.7   GFR: Estimated Creatinine Clearance: 59.5 mL/min (by C-G formula based on SCr of 0.96 mg/dL). Liver Function Tests:  Recent Labs Lab 04/10/17 2200  AST 18  ALT 13*  ALKPHOS 70  BILITOT 1.2  PROT 7.9  ALBUMIN 4.0   Urine analysis:    Component Value Date/Time    COLORURINE YELLOW 04/11/2017 0125   APPEARANCEUR  CLEAR 04/11/2017 0125   LABSPEC >1.046 (H) 04/11/2017 0125   PHURINE 7.0 04/11/2017 0125   GLUCOSEU NEGATIVE 04/11/2017 0125   HGBUR NEGATIVE 04/11/2017 0125   BILIRUBINUR NEGATIVE 04/11/2017 0125   KETONESUR NEGATIVE 04/11/2017 0125   PROTEINUR NEGATIVE 04/11/2017 0125   NITRITE NEGATIVE 04/11/2017 0125   LEUKOCYTESUR TRACE (A) 04/11/2017 0125   Radiological Exams on Admission: Ct Abdomen Pelvis W Contrast  Result Date: 04/11/2017 CLINICAL DATA:  Vomiting and diarrhea since discharge. History of recent diverticular abscess. EXAM: CT ABDOMEN AND PELVIS WITH CONTRAST TECHNIQUE: Multidetector CT imaging of the abdomen and pelvis was performed using the standard protocol following bolus administration of intravenous contrast. CONTRAST:  133mL ISOVUE-300 IOPAMIDOL (ISOVUE-300) INJECTION 61% COMPARISON:  03/29/2017 FINDINGS: Lower chest: The included heart is normal in size without pericardial effusion. There is coronary arteriosclerosis. No pericardial effusion. Lung bases demonstrate dependent atelectasis. No effusion or pneumothorax. Hepatobiliary: Mild hepatic steatosis. Calcified granuloma in the right hepatic dome. Physiologic distention of the gallbladder without wall thickening or calculi. No biliary ductal dilatation. Pancreas: Unremarkable. No pancreatic ductal dilatation or surrounding inflammatory changes. Spleen: Normal in size without focal abnormality. Adrenals/Urinary Tract: Adrenal glands demonstrate stable nodularity of the anterior limb of the left adrenal gland measuring up to 1 cm. Kidneys are normal, without renal calculi, focal lesion, or hydronephrosis. Bladder is unremarkable. Stomach/Bowel: Smaller intramural diverticular abscess along the distal sigmoid colon currently estimated at 2 x 1.6 cm versus 3.6 x 3 cm previously. No new areas of inflammation are identified. There is re- demonstration descending and sigmoid diverticulosis.  No bowel obstruction. Physiologic distention of the stomach. There is normal small bowel rotation. Vascular/Lymphatic: No aortic aneurysm. There is aortoiliac and branch vessel atherosclerosis. No lymphadenopathy. Reproductive: Uterus and bilateral adnexa are unremarkable. Other: No abdominal wall hernia or abnormality. No abdominopelvic ascites. Musculoskeletal: Degenerative disc disease L2 through S1 with vacuum disc phenomenon, disc space narrowing and discogenic sclerosis of the endplates. Associated lumbar facet arthropathy is seen. No acute osseous abnormality is noted. IMPRESSION: 1. Smaller intramural diverticular abscess currently estimated at 2 x 1.6 cm versus 3.6 x 3 cm since prior exam. No recurrence or new inflammatory process noted. 2. No bowel obstruction. 3. Mild hepatic steatosis. 4. Stable left adrenal 1 cm nodule too small further characterize. Electronically Signed   By: Ashley Royalty M.D.   On: 04/11/2017 00:41   Dg Abd Acute W/chest  Result Date: 04/10/2017 CLINICAL DATA:  66 y/o  F; vomiting and diarrhea. EXAM: DG ABDOMEN ACUTE W/ 1V CHEST COMPARISON:  03/24/2017 chest radiograph FINDINGS: Low lung volumes accentuate pulmonary markings. Minor bibasilar atelectasis. No focal consolidation. Normal cardiac silhouette given projection and technique. Aortic atherosclerosis with calcification. Moderate lumbar rotatory levocurvature.  Normal bowel gas pattern. IMPRESSION: Normal bowel gas pattern. Low lung volumes with minor bibasilar atelectasis. Aortic atherosclerosis. Electronically Signed   By: Kristine Garbe M.D.   On: 04/10/2017 21:49    EKG: Independently reviewed. nsr Old chart reviewed Case discussed with dr Betsey Holiday Records from [place] reviewed   Assessment/Plan 66 yo female with intractable n/v and diarrhea with recent diverticular absess  Principal Problem:   Hypokalemia- replet iv.  replet mag. Due to gi losses  Active Problems:   Nausea, vomiting and  diarrhea- r/o cdiff.  Wonder if she is intolerant of the oral flagyl.  Change oral to iv cipro and flagyl for now.  Ct shows actual improvement in the absess, which is very small now   Diverticulitis- as above  Bipolar disorder ([place])- check lithium level due to dehydration   Gastric ulcer- noted   COPD (chronic obstructive pulmonary disease) ([place])- stable     DVT prophylaxis:  scds Code Status:  full Family Communication:  son Disposition Plan:  Per day team Consults called:  none Admission status:  observation   Elsie Sakuma A MD [facility]  If 7PM-7AM, please contact night-coverage [email or web address] Password [email or web address]  04/11/2017, 2:20 AM

## 2017-04-11 NOTE — ED Notes (Signed)
Departure Condition: pt transported by stretcher

## 2017-04-12 DIAGNOSIS — B373 Candidiasis of vulva and vagina: Secondary | ICD-10-CM

## 2017-04-12 DIAGNOSIS — B3731 Acute candidiasis of vulva and vagina: Secondary | ICD-10-CM | POA: Diagnosis present

## 2017-04-12 DIAGNOSIS — K5732 Diverticulitis of large intestine without perforation or abscess without bleeding: Secondary | ICD-10-CM | POA: Diagnosis present

## 2017-04-12 LAB — CBC WITH DIFFERENTIAL/PLATELET
BASOS PCT: 0 %
Basophils Absolute: 0 10*3/uL (ref 0.0–0.1)
EOS ABS: 0.8 10*3/uL — AB (ref 0.0–0.7)
Eosinophils Relative: 6 %
HCT: 39.1 % (ref 36.0–46.0)
HEMOGLOBIN: 12.8 g/dL (ref 12.0–15.0)
Lymphocytes Relative: 19 %
Lymphs Abs: 2.5 10*3/uL (ref 0.7–4.0)
MCH: 30.5 pg (ref 26.0–34.0)
MCHC: 32.7 g/dL (ref 30.0–36.0)
MCV: 93.3 fL (ref 78.0–100.0)
Monocytes Absolute: 1.4 10*3/uL — ABNORMAL HIGH (ref 0.1–1.0)
Monocytes Relative: 10 %
NEUTROS PCT: 65 %
Neutro Abs: 8.8 10*3/uL — ABNORMAL HIGH (ref 1.7–7.7)
Platelets: 278 10*3/uL (ref 150–400)
RBC: 4.19 MIL/uL (ref 3.87–5.11)
RDW: 15 % (ref 11.5–15.5)
WBC: 13.4 10*3/uL — AB (ref 4.0–10.5)

## 2017-04-12 LAB — BASIC METABOLIC PANEL
Anion gap: 5 (ref 5–15)
BUN: 6 mg/dL (ref 6–20)
CALCIUM: 9.8 mg/dL (ref 8.9–10.3)
CO2: 22 mmol/L (ref 22–32)
CREATININE: 0.84 mg/dL (ref 0.44–1.00)
Chloride: 110 mmol/L (ref 101–111)
GFR calc non Af Amer: >60 mL/min (ref >60)
Glucose, Bld: 105 mg/dL — ABNORMAL HIGH (ref 65–99)
Potassium: 4.9 mmol/L (ref 3.5–5.1)
SODIUM: 137 mmol/L (ref 135–145)

## 2017-04-12 LAB — MRSA PCR SCREENING: MRSA BY PCR: POSITIVE — AB

## 2017-04-12 LAB — MAGNESIUM: MAGNESIUM: 2 mg/dL (ref 1.7–2.4)

## 2017-04-12 LAB — C DIFFICILE QUICK SCREEN W PCR REFLEX
C DIFFICILE (CDIFF) INTERP: NOT DETECTED
C Diff antigen: NEGATIVE
C Diff toxin: NEGATIVE

## 2017-04-12 MED ORDER — CIPROFLOXACIN HCL 250 MG PO TABS
500.0000 mg | ORAL_TABLET | Freq: Two times a day (BID) | ORAL | Status: DC
  Administered 2017-04-13: 500 mg via ORAL
  Filled 2017-04-12: qty 2

## 2017-04-12 MED ORDER — MUPIROCIN 2 % EX OINT
1.0000 "application " | TOPICAL_OINTMENT | Freq: Two times a day (BID) | CUTANEOUS | Status: AC
  Administered 2017-04-12 – 2017-04-16 (×10): 1 via NASAL
  Filled 2017-04-12 (×3): qty 22

## 2017-04-12 MED ORDER — METRONIDAZOLE 500 MG PO TABS
500.0000 mg | ORAL_TABLET | Freq: Three times a day (TID) | ORAL | Status: DC
  Administered 2017-04-12 – 2017-04-13 (×2): 500 mg via ORAL
  Filled 2017-04-12 (×2): qty 1

## 2017-04-12 MED ORDER — CHLORHEXIDINE GLUCONATE CLOTH 2 % EX PADS
6.0000 | MEDICATED_PAD | Freq: Every day | CUTANEOUS | Status: AC
  Administered 2017-04-12 – 2017-04-16 (×5): 6 via TOPICAL

## 2017-04-12 NOTE — Progress Notes (Signed)
PROGRESS NOTE    Kathleen Valenzuela  KDX:[id number] DOB: 05-30-1951 DOA: 04/10/2017 PCP: Patient, No Pcp Per    Brief Narrative:   Kathleen Valenzuela is a 66 y.o. female with medical history significant of bipolar disorder, early dementia (she and her son deny this but she is on aricept and its in her chart), copd, htn with recent 2 week hospitalization at [facility] for acute diverticulitis with absess s/p percutaneous drainage of absess.  D/c from there on 04/04/17 with cipro and flagyl orally.  When she went home she was eating okay but not back to normal eating.  She had no diarrhea. She had no pain.  About 3 days being at SNF she started having the diarrhea and vomiting.  She has not been able to keep anything down for days due to nausea all the time.  Denies fevers.  Has had no abdominal pain.  She thinks she is still on the oral abx but is not sure as she does not give herself her meds, the snf does.  Pt found to have a k of 2.2 and referred for admission for evaluation of possible cdiff.  Assessment & Plan:   Principal Problem:   Hypokalemia Active Problems:   Bipolar disorder (HCC)   Gastric ulcer   Nausea vomiting and diarrhea   COPD (chronic obstructive pulmonary disease) (HCC)   Hypomagnesemia   Vaginal candidiasis   Diverticulitis of large intestine with abscess   Patient is clinically improved. Her serum potassium has improved to 4.9. Her nausea and vomiting have virtually resolved. She has no diarrhea. -We'll advance her diet and change Cipro and Flagyl to by mouth.   DVT prophylaxis: SCDs Code Status: Full code Family Communication: Family not available Disposition Plan: Discharge when clinically appropriate, likely in 1-2 days.   Consultants:   None  Procedures:   None  Antimicrobials:   Cipro 04/11/17>>  Flagyl 04/11/17>>   Subjective: Patient had transient nausea but no vomiting. She had one loose bowel movement this morning. She denies vomiting or worsening  abdominal pain.  Objective: Vitals:   04/11/17 2200 04/12/17 0624 04/12/17 0816 04/12/17 1422  BP: 107/76 130/62  106/61  Pulse: 87 73  82  Resp: 18 20  19   Temp: 98.4 F (36.9 C) 97.9 F (36.6 C)  99.1 F (37.3 C)  TempSrc: Oral Oral  Oral  SpO2: 95% 98% 96% 94%  Weight:      Height:        Intake/Output Summary (Last 24 hours) at 04/12/17 2010 Last data filed at 04/12/17 1804  Gross per 24 hour  Intake             1660 ml  Output             2600 ml  Net             -940 ml   Filed Weights   04/10/17 1922 04/11/17 0242  Weight: 86.2 kg (190 lb) 85.8 kg (189 lb 3.2 oz)    Examination:  General exam: Appears calm and comfortable  Respiratory system: Clear to auscultation. Respiratory effort normal. Cardiovascular system: S1 & S2 heard, RRR. No JVD, murmurs, rubs, gallops or clicks. No pedal edema. Gastrointestinal system: Abdomen is nondistended, soft and with mild left lower quadrant tenderness. No organomegaly or masses felt. Normal bowel sounds heard. Central nervous system: Alert and oriented. No focal neurological deficits. Extremities: Symmetric 5 x 5 power. Skin: No rashes, lesions or ulcers Psychiatry: Judgement and  insight appear normal. Mood & affect appropriate.     Data Reviewed: I have personally reviewed following labs and imaging studies  CBC:  Recent Labs Lab 04/10/17 2200 04/11/17 0247 04/12/17 0553  WBC 20.6* 17.6* 13.4*  NEUTROABS 16.3*  --  8.8*  HGB 15.0 15.8* 12.8  HCT 42.7 45.8 39.1  MCV 87.9 89.6 93.3  PLT 379 278 884   Basic Metabolic Panel:  Recent Labs Lab 04/10/17 2200 04/11/17 0247 04/11/17 0905 04/11/17 1953 04/12/17 0553  NA 137 137 134* 141 137  K 2.5* 2.6* 2.6* 5.0 4.9  CL 101 104 101 113* 110  CO2 25 20* 23 23 22   GLUCOSE 134* 152* 130* 131* 105*  BUN 8 7 5* 7 6  CREATININE 0.96 1.00 0.85 0.80 0.84  CALCIUM 10.7* 10.4* 9.8 10.1 9.8  MG 1.7  --  2.1  --  2.0   GFR: Estimated Creatinine Clearance: 67.9  mL/min (by C-G formula based on SCr of 0.84 mg/dL). Liver Function Tests:  Recent Labs Lab 04/10/17 2200  AST 18  ALT 13*  ALKPHOS 70  BILITOT 1.2  PROT 7.9  ALBUMIN 4.0   No results for input(s): LIPASE, AMYLASE in the last 168 hours. No results for input(s): AMMONIA in the last 168 hours. Coagulation Profile: No results for input(s): INR, PROTIME in the last 168 hours. Cardiac Enzymes: No results for input(s): CKTOTAL, CKMB, CKMBINDEX, TROPONINI in the last 168 hours. BNP (last 3 results) No results for input(s): PROBNP in the last 8760 hours. HbA1C: No results for input(s): HGBA1C in the last 72 hours. CBG: No results for input(s): GLUCAP in the last 168 hours. Lipid Profile: No results for input(s): CHOL, HDL, LDLCALC, TRIG, CHOLHDL, LDLDIRECT in the last 72 hours. Thyroid Function Tests: No results for input(s): TSH, T4TOTAL, FREET4, T3FREE, THYROIDAB in the last 72 hours. Anemia Panel: No results for input(s): VITAMINB12, FOLATE, FERRITIN, TIBC, IRON, RETICCTPCT in the last 72 hours. Sepsis Labs: No results for input(s): PROCALCITON, LATICACIDVEN in the last 168 hours.  Recent Results (from the past 240 hour(s))  MRSA PCR Screening     Status: Abnormal   Collection Time: 04/11/17  4:20 AM  Result Value Ref Range Status   MRSA by PCR POSITIVE (A) NEGATIVE Corrected    Comment: RESULT CALLED TO, READ BACK BY AND VERIFIED WITH: Tatum M. AT 0744A ON [id number] BY THOMPSON S.        The GeneXpert MRSA Assay (FDA approved for NASAL specimens only), is one component of a comprehensive MRSA colonization surveillance program. It is not intended to diagnose MRSA infection nor to guide or monitor treatment for MRSA infections. CORRECTED ON 08/08 AT 1057: PREVIOUSLY REPORTED AS RESULT CALLED TO, READ BACK BY AND VERIFIED WITH: HOWERTON M. AT [place] ON [id number] BY THOMPSON S.        The GeneXpert MRSA Assay (FDA approved for NASAL specimens only), is one component of a    comprehensive MRSA colonization surveillance program. It is not intended to diagnose MRSA infection nor to guide or monitor treatment for MRSA infections.   Culture, blood (Routine X 2) w Reflex to ID Panel     Status: None (Preliminary result)   Collection Time: 04/11/17  9:05 AM  Result Value Ref Range Status   Specimen Description BLOOD RIGHT [place]  Final   Special Requests   Final    BOTTLES DRAWN AEROBIC AND ANAEROBIC Blood Culture results may not be optimal due to an inadequate volume of blood  received in culture bottles   Culture NO GROWTH < 24 HOURS  Final   Report Status PENDING  Incomplete  Culture, blood (Routine X 2) w Reflex to ID Panel     Status: None (Preliminary result)   Collection Time: 04/11/17  9:07 AM  Result Value Ref Range Status   Specimen Description BLOOD RIGHT [place]  Final   Special Requests   Final    BOTTLES DRAWN AEROBIC AND ANAEROBIC Blood Culture adequate volume   Culture NO GROWTH < 24 HOURS  Final   Report Status PENDING  Incomplete  C difficile quick scan w PCR reflex     Status: None   Collection Time: 04/12/17 12:59 AM  Result Value Ref Range Status   C Diff antigen NEGATIVE NEGATIVE Final   C Diff toxin NEGATIVE NEGATIVE Final   C Diff interpretation No C. difficile detected.  Final         Radiology Studies: Ct Abdomen Pelvis W Contrast  Result Date: 04/11/2017 CLINICAL DATA:  Vomiting and diarrhea since discharge. History of recent diverticular abscess. EXAM: CT ABDOMEN AND PELVIS WITH CONTRAST TECHNIQUE: Multidetector CT imaging of the abdomen and pelvis was performed using the standard protocol following bolus administration of intravenous contrast. CONTRAST:  191mL ISOVUE-300 IOPAMIDOL (ISOVUE-300) INJECTION 61% COMPARISON:  03/29/2017 FINDINGS: Lower chest: The included heart is normal in size without pericardial effusion. There is coronary arteriosclerosis. No pericardial effusion. Lung bases demonstrate dependent atelectasis. No effusion  or pneumothorax. Hepatobiliary: Mild hepatic steatosis. Calcified granuloma in the right hepatic dome. Physiologic distention of the gallbladder without wall thickening or calculi. No biliary ductal dilatation. Pancreas: Unremarkable. No pancreatic ductal dilatation or surrounding inflammatory changes. Spleen: Normal in size without focal abnormality. Adrenals/Urinary Tract: Adrenal glands demonstrate stable nodularity of the anterior limb of the left adrenal gland measuring up to 1 cm. Kidneys are normal, without renal calculi, focal lesion, or hydronephrosis. Bladder is unremarkable. Stomach/Bowel: Smaller intramural diverticular abscess along the distal sigmoid colon currently estimated at 2 x 1.6 cm versus 3.6 x 3 cm previously. No new areas of inflammation are identified. There is re- demonstration descending and sigmoid diverticulosis. No bowel obstruction. Physiologic distention of the stomach. There is normal small bowel rotation. Vascular/Lymphatic: No aortic aneurysm. There is aortoiliac and branch vessel atherosclerosis. No lymphadenopathy. Reproductive: Uterus and bilateral adnexa are unremarkable. Other: No abdominal wall hernia or abnormality. No abdominopelvic ascites. Musculoskeletal: Degenerative disc disease L2 through S1 with vacuum disc phenomenon, disc space narrowing and discogenic sclerosis of the endplates. Associated lumbar facet arthropathy is seen. No acute osseous abnormality is noted. IMPRESSION: 1. Smaller intramural diverticular abscess currently estimated at 2 x 1.6 cm versus 3.6 x 3 cm since prior exam. No recurrence or new inflammatory process noted. 2. No bowel obstruction. 3. Mild hepatic steatosis. 4. Stable left adrenal 1 cm nodule too small further characterize. Electronically Signed   By: Ashley Royalty M.D.   On: 04/11/2017 00:41   Dg Abd Acute W/chest  Result Date: 04/10/2017 CLINICAL DATA:  66 y/o  F; vomiting and diarrhea. EXAM: DG ABDOMEN ACUTE W/ 1V CHEST COMPARISON:   03/24/2017 chest radiograph FINDINGS: Low lung volumes accentuate pulmonary markings. Minor bibasilar atelectasis. No focal consolidation. Normal cardiac silhouette given projection and technique. Aortic atherosclerosis with calcification. Moderate lumbar rotatory levocurvature.  Normal bowel gas pattern. IMPRESSION: Normal bowel gas pattern. Low lung volumes with minor bibasilar atelectasis. Aortic atherosclerosis. Electronically Signed   By: Kristine Garbe M.D.   On: 04/10/2017 21:49  Scheduled Meds: . albuterol  3 mL Inhalation BID  . ALPRAZolam  1 mg Oral TID  . atorvastatin  10 mg Oral Daily  . Chlorhexidine Gluconate Cloth  6 each Topical Q0600  . clotrimazole  1 Applicatorful Vaginal QHS  . dicyclomine  20 mg Oral TID AC & HS  . donepezil  10 mg Oral Q2000  . feeding supplement (ENSURE ENLIVE)  237 mL Oral BID BM  . fluconazole  150 mg Oral Daily  . isosorbide mononitrate  30 mg Oral Daily  . lithium carbonate  300 mg Oral Q24H  . lithium carbonate  450 mg Oral Q breakfast  . metoprolol succinate  50 mg Oral Daily  . multivitamin with minerals  1 tablet Oral Daily  . mupirocin ointment  1 application Nasal BID  . oxyCODONE  20 mg Oral Q12H  . pantoprazole  40 mg Oral BID AC  . traZODone  50 mg Oral Q2000   Continuous Infusions: . ciprofloxacin Stopped (04/12/17 1645)  . metronidazole 500 mg (04/12/17 1751)     LOS: 1 day    Time spent: 56 minutes    Rexene Alberts, MD [facility] Pager [phone]  If 7PM-7AM, please contact night-coverage [email or web address] Password [email or web address] 04/12/2017, 8:10 PM

## 2017-04-13 ENCOUNTER — Inpatient Hospital Stay (HOSPITAL_COMMUNITY): Payer: Medicare Other

## 2017-04-13 DIAGNOSIS — I10 Essential (primary) hypertension: Secondary | ICD-10-CM | POA: Insufficient documentation

## 2017-04-13 DIAGNOSIS — D72829 Elevated white blood cell count, unspecified: Secondary | ICD-10-CM

## 2017-04-13 LAB — BASIC METABOLIC PANEL WITH GFR
Anion gap: 6 (ref 5–15)
BUN: 6 mg/dL (ref 6–20)
CO2: 23 mmol/L (ref 22–32)
Calcium: 10.3 mg/dL (ref 8.9–10.3)
Chloride: 107 mmol/L (ref 101–111)
Creatinine, Ser: 0.87 mg/dL (ref 0.44–1.00)
GFR calc Af Amer: >60 mL/min
GFR calc non Af Amer: >60 mL/min
Glucose, Bld: 140 mg/dL — ABNORMAL HIGH (ref 65–99)
Potassium: 4.2 mmol/L (ref 3.5–5.1)
Sodium: 136 mmol/L (ref 135–145)

## 2017-04-13 LAB — URINALYSIS, ROUTINE W REFLEX MICROSCOPIC
Bilirubin Urine: NEGATIVE
GLUCOSE, UA: NEGATIVE mg/dL
HGB URINE DIPSTICK: NEGATIVE
Ketones, ur: NEGATIVE mg/dL
NITRITE: NEGATIVE
PROTEIN: 30 mg/dL — AB
SPECIFIC GRAVITY, URINE: 1.017 (ref 1.005–1.030)
pH: 6 (ref 5.0–8.0)

## 2017-04-13 LAB — CBC
HCT: 40 % (ref 36.0–46.0)
HCT: 38.5 % (ref 36.0–46.0)
HEMOGLOBIN: 13.3 g/dL (ref 12.0–15.0)
Hemoglobin: 12.7 g/dL (ref 12.0–15.0)
MCH: 30.9 pg (ref 26.0–34.0)
MCH: 30.7 pg (ref 26.0–34.0)
MCHC: 33.3 g/dL (ref 30.0–36.0)
MCHC: 33 g/dL (ref 30.0–36.0)
MCV: 92.8 fL (ref 78.0–100.0)
MCV: 93 fL (ref 78.0–100.0)
PLATELETS: 282 10*3/uL (ref 150–400)
Platelets: 248 K/uL (ref 150–400)
RBC: 4.31 MIL/uL (ref 3.87–5.11)
RBC: 4.14 MIL/uL (ref 3.87–5.11)
RDW: 14.8 % (ref 11.5–15.5)
RDW: 14.7 % (ref 11.5–15.5)
WBC: 17.6 10*3/uL — AB (ref 4.0–10.5)
WBC: 17 K/uL — ABNORMAL HIGH (ref 4.0–10.5)

## 2017-04-13 LAB — MAGNESIUM: Magnesium: 1.7 mg/dL (ref 1.7–2.4)

## 2017-04-13 MED ORDER — METRONIDAZOLE IN NACL 5-0.79 MG/ML-% IV SOLN
500.0000 mg | Freq: Three times a day (TID) | INTRAVENOUS | Status: DC
  Administered 2017-04-13 – 2017-04-19 (×19): 500 mg via INTRAVENOUS
  Filled 2017-04-13 (×18): qty 100

## 2017-04-13 MED ORDER — POTASSIUM CHLORIDE IN NACL 20-0.9 MEQ/L-% IV SOLN
INTRAVENOUS | Status: DC
  Administered 2017-04-13 – 2017-04-17 (×2): via INTRAVENOUS

## 2017-04-13 MED ORDER — DICYCLOMINE HCL 10 MG PO CAPS
10.0000 mg | ORAL_CAPSULE | Freq: Three times a day (TID) | ORAL | Status: DC
  Administered 2017-04-13 – 2017-04-17 (×17): 10 mg via ORAL
  Filled 2017-04-13 (×16): qty 1

## 2017-04-13 MED ORDER — ENOXAPARIN SODIUM 40 MG/0.4ML ~~LOC~~ SOLN
40.0000 mg | SUBCUTANEOUS | Status: DC
  Administered 2017-04-13 – 2017-04-16 (×4): 40 mg via SUBCUTANEOUS
  Filled 2017-04-13 (×5): qty 0.4

## 2017-04-13 MED ORDER — CIPROFLOXACIN IN D5W 400 MG/200ML IV SOLN
400.0000 mg | Freq: Two times a day (BID) | INTRAVENOUS | Status: DC
  Administered 2017-04-13 – 2017-04-19 (×13): 400 mg via INTRAVENOUS
  Filled 2017-04-13 (×13): qty 200

## 2017-04-13 NOTE — Progress Notes (Signed)
PROGRESS NOTE    Kathleen Valenzuela  TOI:[id number] DOB: May 31, 1951 DOA: 04/10/2017 PCP: Patient, No Pcp Per    Brief Narrative:   Kathleen Valenzuela is a 66 y.o. female with medical history significant of bipolar disorder, early dementia (she and her son deny this but she is on aricept and its in her chart), COPD, HTN and with a recent 2 week hospitalization at [facility] for acute diverticulitis with absess s/p percutaneous drainage of the absess.   she was discharged from there on 04/04/17 on cipro and flagyl orally.  When she went home she was eating okay but not back to normal eating.  She had no diarrhea. She had no pain. After about 3 days of being at SNF, she started having  diarrhea and vomiting.  She had not been able to keep anything down for days due to nausea all the time. She denied fever and chills. She denied abdominal pain.  In the ED, she was afebrile and hemodynamically stable. Her lab data were significant for a serum potassium of 2.2, white blood cell count of 20.6, and CT scan of the abdomen/pelvis which revealed a smaller intramural diverticular abscess. She was admitted for further evaluation and management.  Assessment & Plan:   Principal Problem:   Hypokalemia Active Problems:   Nausea vomiting and diarrhea   Diverticulitis of large intestine with abscess   Bipolar disorder (HCC)   COPD (chronic obstructive pulmonary disease) (HCC)   Vaginal candidiasis   Essential hypertension   1. Severe hypokalemia Patient was started on multiple potassium runs. When she was able to take medications by mouth without N/V, oral potassium was given. -Magnesium level was assessed and it was found to be low-normal. 2 g of magnesium sulfate was given. -Both her serum potassium and magnesium improved. -The likely etiology of hyperkalemia was due to GI losses.  Nausea/vomiting/diarrhea. Patient was started on IV fluids for hydration, as needed antiemetics, and as needed analgesics. Protonix was  continued. -C. difficile PCR was ordered and it was negative for C. Difficile. -Her symptoms subsided and have nearly resolved. -Her diet was advanced from clear liquids to a soft diet which she appears to be tolerating.  Diverticulitis with abscess. Patient was apparently hospitalized at [facility] for diverticulitis with abscess. She was treated with Cipro and Flagyl and subsequently discharged to the SNF on oral Cipro/Flagyl. -On admission, she was afebrile, but her white blood cell count was 20.6. -In the ED, CT scan of her abdomen and pelvis revealed a smaller intramural diverticular abscess estimated to by 1.6 cm versus 3.6 x 3 cm since the prior exam. There is no evidence of bowel obstruction or recurrent or new inflammatory process. -The patient was restarted on IV Cipro and Flagyl. Her white blood cell count decreased to 13.4. When she improved clinically they were transitioned to by mouth. -However in light of the low-grade fever and increase in her white blood cell count to 17, IV Cipro and Flagyl will be restarted. -We'll decrease Bentyl to 10 mg.  Low-grade fever/leukocytosis. CT of her abdomen and pelvis was noted to show a decrease in abscess size. However with the low-grade fever and increase in her white blood cell count, Cipro/Flagyl will be transitioned back to IV again. -We'll order blood cultures, chest x-ray, and a urine culture. If studies are negative, will consider another CT scan of her abdomen/pelvis.  Essential hypertension with query CAD/CHF history. Patient is treated chronically with Toprol-XL, isosorbide mononitrate, Lasix, and Lipitor. They  were all continued on admission with the exception of Lasix due to GI losses. -Restart Lasix as appropriate. -Currently stable but blood pressure trending toward low-normal. Will place parameters on the Toprol-XL and isosorbide mononitrate.  Vaginal candidiasis. The patient had previously complained of vaginal itching  and a rash. (Sees not examined). The findings per report appear to be consistent with vaginal candidiasis. -Oral Diflucan and topical nystatin were started. -We'll continue oral Diflucan 2 more days.  Bipolar disorder. The patient is treated chronically with lithium, Xanax, and trazodone. They were all continued. Currently stable.   DVT prophylaxis: SCDs>>>Lovenox Code Status: Full code Family Communication: Family not available Disposition Plan: Discharge when clinically appropriate, likely in 1-2 days.   Consultants:   None  Procedures:   None  Antimicrobials:   Cipro 04/11/17>>  Flagyl 04/11/17>>   Subjective: Patient denies abdominal pain, nausea, and vomiting. She denies chest congestion or pain with urination. She stated that she did not get enough sleep last night.  Objective: Vitals:   04/12/17 1422 04/12/17 2039 04/13/17 0524 04/13/17 1320  BP: 106/61  (!) 115/59 112/72  Pulse: 82  (!) 107 (!) 108  Resp: 19  18 18   Temp: 99.1 F (37.3 C)  99.8 F (37.7 C) 100.1 F (37.8 C)  TempSrc: Oral  Oral Oral  SpO2: 94% 94% 92% 91%  Weight:      Height:        Intake/Output Summary (Last 24 hours) at 04/13/17 1522 Last data filed at 04/13/17 0900  Gross per 24 hour  Intake              990 ml  Output              400 ml  Net              590 ml   Filed Weights   04/10/17 1922 04/11/17 0242  Weight: 86.2 kg (190 lb) 85.8 kg (189 lb 3.2 oz)    Examination:  General exam: Appears calm and comfortable  Respiratory system: Clear to auscultationWith decreased breath sounds in the bases. Respiratory effort normal. Cardiovascular system: S1 & S2 heard, RRR. No JVD, murmurs, rubs, gallops or clicks. No pedal edema. Gastrointestinal system: Abdomen is nondistended, soft and without significant tenderness. No organomegaly or masses felt. Normal bowel sounds heard. Central nervous system: Alert and oriented. No focal neurological deficits. Extremities: Symmetric 5 x  5 power. Skin: No rashes, lesions or ulcers Psychiatry: Judgement and insight appear normal. Mood & affect appropriate.     Data Reviewed: I have personally reviewed following labs and imaging studies  CBC:  Recent Labs Lab 04/10/17 2200 04/11/17 0247 04/12/17 0553 04/13/17 0652 04/13/17 1137  WBC 20.6* 17.6* 13.4* 17.6* 17.0*  NEUTROABS 16.3*  --  8.8*  --   --   HGB 15.0 15.8* 12.8 13.3 12.7  HCT 42.7 45.8 39.1 40.0 38.5  MCV 87.9 89.6 93.3 92.8 93.0  PLT 379 278 278 282 510   Basic Metabolic Panel:  Recent Labs Lab 04/10/17 2200 04/11/17 0247 04/11/17 0905 04/11/17 1953 04/12/17 0553 04/13/17 0652  NA 137 137 134* 141 137 136  K 2.5* 2.6* 2.6* 5.0 4.9 4.2  CL 101 104 101 113* 110 107  CO2 25 20* 23 23 22 23   GLUCOSE 134* 152* 130* 131* 105* 140*  BUN 8 7 5* 7 6 6   CREATININE 0.96 1.00 0.85 0.80 0.84 0.87  CALCIUM 10.7* 10.4* 9.8 10.1 9.8 10.3  MG 1.7  --  2.1  --  2.0 1.7   GFR: Estimated Creatinine Clearance: 65.5 mL/min (by C-G formula based on SCr of 0.87 mg/dL). Liver Function Tests:  Recent Labs Lab 04/10/17 2200  AST 18  ALT 13*  ALKPHOS 70  BILITOT 1.2  PROT 7.9  ALBUMIN 4.0   No results for input(s): LIPASE, AMYLASE in the last 168 hours. No results for input(s): AMMONIA in the last 168 hours. Coagulation Profile: No results for input(s): INR, PROTIME in the last 168 hours. Cardiac Enzymes: No results for input(s): CKTOTAL, CKMB, CKMBINDEX, TROPONINI in the last 168 hours. BNP (last 3 results) No results for input(s): PROBNP in the last 8760 hours. HbA1C: No results for input(s): HGBA1C in the last 72 hours. CBG: No results for input(s): GLUCAP in the last 168 hours. Lipid Profile: No results for input(s): CHOL, HDL, LDLCALC, TRIG, CHOLHDL, LDLDIRECT in the last 72 hours. Thyroid Function Tests: No results for input(s): TSH, T4TOTAL, FREET4, T3FREE, THYROIDAB in the last 72 hours. Anemia Panel: No results for input(s): VITAMINB12,  FOLATE, FERRITIN, TIBC, IRON, RETICCTPCT in the last 72 hours. Sepsis Labs: No results for input(s): PROCALCITON, LATICACIDVEN in the last 168 hours.  Recent Results (from the past 240 hour(s))  MRSA PCR Screening     Status: Abnormal   Collection Time: 04/11/17  4:20 AM  Result Value Ref Range Status   MRSA by PCR POSITIVE (A) NEGATIVE Corrected    Comment: RESULT CALLED TO, READ BACK BY AND VERIFIED WITH: Newnan M. AT 0744A ON [id number] BY THOMPSON S.        The GeneXpert MRSA Assay (FDA approved for NASAL specimens only), is one component of a comprehensive MRSA colonization surveillance program. It is not intended to diagnose MRSA infection nor to guide or monitor treatment for MRSA infections. CORRECTED ON 08/08 AT 1057: PREVIOUSLY REPORTED AS RESULT CALLED TO, READ BACK BY AND VERIFIED WITH: HOWERTON M. AT [place] ON [id number] BY THOMPSON S.        The GeneXpert MRSA Assay (FDA approved for NASAL specimens only), is one component of a  comprehensive MRSA colonization surveillance program. It is not intended to diagnose MRSA infection nor to guide or monitor treatment for MRSA infections.   Culture, blood (Routine X 2) w Reflex to ID Panel     Status: None (Preliminary result)   Collection Time: 04/11/17  9:05 AM  Result Value Ref Range Status   Specimen Description BLOOD RIGHT [place]  Final   Special Requests   Final    BOTTLES DRAWN AEROBIC AND ANAEROBIC Blood Culture results may not be optimal due to an inadequate volume of blood received in culture bottles   Culture NO GROWTH 2 DAYS  Final   Report Status PENDING  Incomplete  Culture, blood (Routine X 2) w Reflex to ID Panel     Status: None (Preliminary result)   Collection Time: 04/11/17  9:07 AM  Result Value Ref Range Status   Specimen Description BLOOD RIGHT [place]  Final   Special Requests   Final    BOTTLES DRAWN AEROBIC AND ANAEROBIC Blood Culture adequate volume   Culture NO GROWTH 2 DAYS  Final   Report Status PENDING   Incomplete  C difficile quick scan w PCR reflex     Status: None   Collection Time: 04/12/17 12:59 AM  Result Value Ref Range Status   C Diff antigen NEGATIVE NEGATIVE Final   C Diff toxin NEGATIVE NEGATIVE Final   C Diff interpretation No C.  difficile detected.  Final         Radiology Studies: No results found.      Scheduled Meds: . ALPRAZolam  1 mg Oral TID  . atorvastatin  10 mg Oral Daily  . Chlorhexidine Gluconate Cloth  6 each Topical Q0600  . clotrimazole  1 Applicatorful Vaginal QHS  . dicyclomine  20 mg Oral TID AC & HS  . donepezil  10 mg Oral Q2000  . feeding supplement (ENSURE ENLIVE)  237 mL Oral BID BM  . fluconazole  150 mg Oral Daily  . isosorbide mononitrate  30 mg Oral Daily  . lithium carbonate  300 mg Oral Q24H  . lithium carbonate  450 mg Oral Q breakfast  . metoprolol succinate  50 mg Oral Daily  . multivitamin with minerals  1 tablet Oral Daily  . mupirocin ointment  1 application Nasal BID  . oxyCODONE  20 mg Oral Q12H  . pantoprazole  40 mg Oral BID AC  . traZODone  50 mg Oral Q2000   Continuous Infusions: . 0.9 % NaCl with KCl 20 mEq / L 75 mL/hr at 04/13/17 1506  . ciprofloxacin 400 mg (04/13/17 1506)  . metronidazole 500 mg (04/13/17 1506)     LOS: 2 days    Time spent: 36 minutes    Rexene Alberts, MD [facility] Pager [phone]  If 7PM-7AM, please contact night-coverage [email or web address] Password [email or web address] 04/13/2017, 3:22 PM

## 2017-04-14 DIAGNOSIS — N390 Urinary tract infection, site not specified: Secondary | ICD-10-CM | POA: Diagnosis not present

## 2017-04-14 DIAGNOSIS — R7881 Bacteremia: Secondary | ICD-10-CM | POA: Diagnosis present

## 2017-04-14 DIAGNOSIS — I10 Essential (primary) hypertension: Secondary | ICD-10-CM

## 2017-04-14 LAB — BASIC METABOLIC PANEL
ANION GAP: 5 (ref 5–15)
BUN: 7 mg/dL (ref 6–20)
CALCIUM: 9.4 mg/dL (ref 8.9–10.3)
CO2: 24 mmol/L (ref 22–32)
Chloride: 105 mmol/L (ref 101–111)
Creatinine, Ser: 0.89 mg/dL (ref 0.44–1.00)
GFR calc Af Amer: >60 mL/min (ref >60)
GLUCOSE: 142 mg/dL — AB (ref 65–99)
Potassium: 3.8 mmol/L (ref 3.5–5.1)
SODIUM: 134 mmol/L — AB (ref 135–145)

## 2017-04-14 LAB — BLOOD CULTURE ID PANEL (REFLEXED)
ACINETOBACTER BAUMANNII: NOT DETECTED
CANDIDA KRUSEI: NOT DETECTED
CANDIDA PARAPSILOSIS: NOT DETECTED
CARBAPENEM RESISTANCE: NOT DETECTED
Candida albicans: NOT DETECTED
Candida glabrata: NOT DETECTED
Candida tropicalis: NOT DETECTED
ENTEROCOCCUS SPECIES: NOT DETECTED
Enterobacter cloacae complex: NOT DETECTED
Enterobacteriaceae species: NOT DETECTED
Escherichia coli: NOT DETECTED
Haemophilus influenzae: NOT DETECTED
KLEBSIELLA OXYTOCA: NOT DETECTED
KLEBSIELLA PNEUMONIAE: NOT DETECTED
LISTERIA MONOCYTOGENES: NOT DETECTED
Methicillin resistance: DETECTED — AB
NEISSERIA MENINGITIDIS: NOT DETECTED
Proteus species: NOT DETECTED
Pseudomonas aeruginosa: NOT DETECTED
SERRATIA MARCESCENS: NOT DETECTED
STAPHYLOCOCCUS SPECIES: DETECTED — AB
Staphylococcus aureus (BCID): NOT DETECTED
Streptococcus agalactiae: NOT DETECTED
Streptococcus pneumoniae: NOT DETECTED
Streptococcus pyogenes: NOT DETECTED
Streptococcus species: NOT DETECTED
VANCOMYCIN RESISTANCE: NOT DETECTED

## 2017-04-14 LAB — CBC
HCT: 36.2 % (ref 36.0–46.0)
Hemoglobin: 11.7 g/dL — ABNORMAL LOW (ref 12.0–15.0)
MCH: 30.3 pg (ref 26.0–34.0)
MCHC: 32.3 g/dL (ref 30.0–36.0)
MCV: 93.8 fL (ref 78.0–100.0)
PLATELETS: 198 10*3/uL (ref 150–400)
RBC: 3.86 MIL/uL — AB (ref 3.87–5.11)
RDW: 14.6 % (ref 11.5–15.5)
WBC: 15.4 10*3/uL — AB (ref 4.0–10.5)

## 2017-04-14 MED ORDER — ISOSORBIDE MONONITRATE ER 30 MG PO TB24: 15.0000 mg | ORAL_TABLET | Freq: Every day | ORAL | Status: DC

## 2017-04-14 MED ORDER — VANCOMYCIN HCL 10 G IV SOLR
1500.0000 mg | Freq: Once | INTRAVENOUS | Status: AC
  Administered 2017-04-14: 1500 mg via INTRAVENOUS
  Filled 2017-04-14: qty 1500

## 2017-04-14 MED ORDER — METOPROLOL SUCCINATE ER 25 MG PO TB24
25.0000 mg | ORAL_TABLET | Freq: Every day | ORAL | Status: DC
  Administered 2017-04-15 – 2017-04-24 (×9): 25 mg via ORAL
  Filled 2017-04-14 (×9): qty 1

## 2017-04-14 MED ORDER — ISOSORBIDE MONONITRATE ER 30 MG PO TB24
15.0000 mg | ORAL_TABLET | Freq: Every day | ORAL | Status: DC
  Administered 2017-04-15 – 2017-04-24 (×9): 15 mg via ORAL
  Filled 2017-04-14 (×9): qty 1

## 2017-04-14 MED ORDER — VANCOMYCIN HCL IN DEXTROSE 1-5 GM/200ML-% IV SOLN
1000.0000 mg | Freq: Two times a day (BID) | INTRAVENOUS | Status: DC
  Administered 2017-04-14 – 2017-04-17 (×7): 1000 mg via INTRAVENOUS
  Filled 2017-04-14 (×7): qty 200

## 2017-04-14 MED ORDER — LIDOCAINE VISCOUS 2 % MT SOLN: 15.0000 mL | Freq: Once | OROMUCOSAL | Status: DC

## 2017-04-14 NOTE — Progress Notes (Addendum)
Approx. 10 am Dr. Caryn Section was in to see patient.  I notified her of the patients infiltrate in the left arm.  She discussed with the patient that it was phlebitis and we would put heat to the area and pain medication would be given.    1700 also we discussed the patients glipizide.  Voiced that I held it and she said that was okay.  Voiced to her CBG is now 102 .  New orders given and followed.  MD was made aware of positive cocci in clusters.  Patient currently on abx

## 2017-04-14 NOTE — Progress Notes (Signed)
PHARMACY - PHYSICIAN COMMUNICATION CRITICAL VALUE ALERT - BLOOD CULTURE IDENTIFICATION (BCID)  Results for orders placed or performed during the hospital encounter of 04/10/17  Blood Culture ID Panel (Reflexed) (Collected: 04/13/2017  3:19 PM)  Result Value Ref Range   Enterococcus species NOT DETECTED NOT DETECTED   Vancomycin resistance NOT DETECTED NOT DETECTED   Listeria monocytogenes NOT DETECTED NOT DETECTED   Staphylococcus species DETECTED (A) NOT DETECTED   Staphylococcus aureus NOT DETECTED NOT DETECTED   Methicillin resistance DETECTED (A) NOT DETECTED   Streptococcus species NOT DETECTED NOT DETECTED   Streptococcus agalactiae NOT DETECTED NOT DETECTED   Streptococcus pneumoniae NOT DETECTED NOT DETECTED   Streptococcus pyogenes NOT DETECTED NOT DETECTED   Acinetobacter baumannii NOT DETECTED NOT DETECTED   Enterobacteriaceae species NOT DETECTED NOT DETECTED   Enterobacter cloacae complex NOT DETECTED NOT DETECTED   Escherichia coli NOT DETECTED NOT DETECTED   Klebsiella oxytoca NOT DETECTED NOT DETECTED   Klebsiella pneumoniae NOT DETECTED NOT DETECTED   Proteus species NOT DETECTED NOT DETECTED   Serratia marcescens NOT DETECTED NOT DETECTED   Carbapenem resistance NOT DETECTED NOT DETECTED   Haemophilus influenzae NOT DETECTED NOT DETECTED   Neisseria meningitidis NOT DETECTED NOT DETECTED   Pseudomonas aeruginosa NOT DETECTED NOT DETECTED   Candida albicans NOT DETECTED NOT DETECTED   Candida glabrata NOT DETECTED NOT DETECTED   Candida krusei NOT DETECTED NOT DETECTED   Candida parapsilosis NOT DETECTED NOT DETECTED   Candida tropicalis NOT DETECTED NOT DETECTED   Name of physician (or Provider) Contacted: Dr Caryn Section  Changes to prescribed antibiotics required: Continue Vancomycin in addition to other ABX  Kathleen Valenzuela A 04/14/2017  3:09 PM

## 2017-04-14 NOTE — Progress Notes (Signed)
PROGRESS NOTE    Kathleen Valenzuela  WYO:[id number] DOB: 1950/09/22 DOA: 04/10/2017 PCP: Patient, No Pcp Per    Brief Narrative:   Kathleen Valenzuela is a 66 y.o. female with medical history significant of bipolar disorder, early dementia (she and her son deny this but she is on aricept and its in her chart), COPD, HTN and with a recent 2 week hospitalization at [facility] for acute diverticulitis with absess s/p percutaneous drainage of the absess.   she was discharged from there on 04/04/17 on cipro and flagyl orally.  When she went home she was eating okay but not back to normal eating.  She had no diarrhea. She had no pain. After about 3 days of being at SNF, she started having  diarrhea and vomiting.  She had not been able to keep anything down for days due to nausea all the time. She denied fever and chills. She denied abdominal pain.  In the ED, she was afebrile and hemodynamically stable. Her lab data were significant for a serum potassium of 2.2, white blood cell count of 20.6, and CT scan of the abdomen/pelvis which revealed a smaller intramural diverticular abscess. She was admitted for further evaluation and management.  Assessment & Plan:   Principal Problem:   Hypokalemia Active Problems:   Nausea vomiting and diarrhea   Diverticulitis of large intestine with abscess   Bipolar disorder (HCC)   COPD (chronic obstructive pulmonary disease) (HCC)   Vaginal candidiasis   Essential hypertension   1. Severe hypokalemia Patient was started on multiple potassium runs. When she was able to take medications by mouth without N/V, oral potassium was given. -Magnesium level was assessed and it was found to be low-normal. 2 g of magnesium sulfate was given. -Both her serum potassium and magnesium improved. -The likely etiology of hyperkalemia was due to GI losses.  Diverticulitis with abscess. Patient was apparently hospitalized at [facility] for diverticulitis with abscess. She was treated  with Cipro and Flagyl and subsequently discharged to the SNF on oral Cipro/Flagyl. -On admission, she was afebrile, but her white blood cell count was 20.6. -In the ED, CT scan of her abdomen and pelvis revealed a smaller intramural diverticular abscess estimated to by 1.6 cm versus 3.6 x 3 cm since the prior exam. There was no evidence of bowel obstruction or recurrent or new inflammatory process. -The patient was restarted on IV Cipro and Flagyl. Her white blood cell count decreased to 13.4. When she improved clinically they were transitioned to by mouth. -However in light of the low-grade fever and increase in her white blood cell count on 8/9, IV Cipro and Flagyl were restarted. - Bentyl was decreased to 10 mg.  Low-grade fever/leukocytosis. UTI GPC Bacteremia Patient developed a low-grade fever and increase in her WBC on 8/9. -Cipro and Flagyl changed back to IV. Vancomycin added empirically on 8/9. -Chest x-ray ordered and revealed no obvious infiltrate. -Urinalysis revealed moderate leukocytes and 6-30 WBCs/rbc's. Urine culture ordered. -Second set of blood cultures on 8/9 became positive for gram-positive cocci in clusters-ID pending. Blood cultures on 8/7 have remained negative. -Her WBC has improved. -We'll hold off on rescanning her abdomen.  Nausea/vomiting/diarrhea. Patient was started on IV fluids for hydration, as needed antiemetics, and as needed analgesics. Protonix was continued. -C. difficile PCR was ordered and it was negative. -Her symptoms subsided and then resolved. -Her diet was advanced from clear liquids to a soft diet which she appears to be tolerating.  Essential hypertension  with query CAD/CHF history. Patient is treated chronically with Toprol-XL, isosorbide mononitrate, Lasix, and Lipitor. They were all continued on admission with the exception of Lasix due to GI losses. -Her blood pressures have been soft over the past 24-48 hours, therefore, Toprol-XL was  decreased to 25 mg daily and Imdur was decreased to 15 mg daily. -Restart Lasix as appropriate. -Increase IV fluid rate. -Currently stable but blood pressure trending toward low-normal. Will place parameters on the Toprol-XL and isosorbide mononitrate.  Vaginal candidiasis. The patient had previously complained of vaginal itching and a rash. There is mild macular erythematous rash of her perineum and inner thighs, consistent with vaginal candidiasis. -Oral Diflucan and topical nystatin were started. -We'll continue oral Diflucan 1 more days. -She is symptomatically improved.  Mild hyperglycemia. Patient has no history of diabetes. Her venous glucose is modestly elevated. -Will order A1c and continue to monitor. Hold off on insulin for now.  Left arm phlebitis. Nurse reports that the previous IV in her left arm infiltrated and caused some edema and pain. -Warm compresses applied. Arm elevation was discussed with nursing. Continue supportive treatment.  Bipolar disorder. The patient is treated chronically with lithium, Xanax, and trazodone. They were all continued. Currently stable.   DVT prophylaxis: SCDs>>>Lovenox Code Status: Full code Family Communication: Discussed with son Disposition Plan: Discharge when clinically appropriate, likely in 1-2 days.   Consultants:   None  Procedures:   None  Antimicrobials:   Cipro 04/11/17>>  Flagyl 04/11/17>>   Subjective: Patient denies abdominal pain, nausea, and vomiting. She complains of left arm pain which started after the IV infiltrated per nursing.  Objective: Vitals:   04/13/17 2033 04/13/17 2200 04/14/17 0532 04/14/17 1009  BP:  107/66 (!) 98/52 (!) 94/54  Pulse:  94 95 87  Resp:  20 16   Temp:  99.5 F (37.5 C) 99.7 F (37.6 C)   TempSrc:  Oral Oral   SpO2: 90% 94% 97%   Weight:      Height:        Intake/Output Summary (Last 24 hours) at 04/14/17 1411 Last data filed at 04/14/17 0900  Gross per 24 hour    Intake           844.83 ml  Output              400 ml  Net           444.83 ml   Filed Weights   04/10/17 1922 04/11/17 0242  Weight: 86.2 kg (190 lb) 85.8 kg (189 lb 3.2 oz)    Examination:  General exam: Appears uncomfortable due to the arm pain.  Respiratory system: Clear to auscultationWith decreased breath sounds in the bases. Respiratory effort normal. Cardiovascular system: S1 & S2 heard, RRR. No JVD, murmurs, rubs, gallops or clicks. No pedal edema. Gastrointestinal system: Abdomen is nondistended, soft and without tenderness. No organomegaly or masses felt. Normal bowel sounds heard. GU: Mild erythema of the perineum and proximal inner thighs. Central nervous system: Alert and oriented. No focal neurological deficits. Extremities/musculoskeletal: She is moving all of her extremities spontaneously. No acute hot red joints. Skin: Superficial mild erythema, warmth and edema of the left arm. Radial pulse palpable. Good range of motion of left upper extremity. Psychiatry: Judgement and insight appear normal. Mood is tearful.     Data Reviewed: I have personally reviewed following labs and imaging studies  CBC:  Recent Labs Lab 04/10/17 2200 04/11/17 0247 04/12/17 0553 04/13/17 0652 04/13/17 1137 04/14/17  0731  WBC 20.6* 17.6* 13.4* 17.6* 17.0* 15.4*  NEUTROABS 16.3*  --  8.8*  --   --   --   HGB 15.0 15.8* 12.8 13.3 12.7 11.7*  HCT 42.7 45.8 39.1 40.0 38.5 36.2  MCV 87.9 89.6 93.3 92.8 93.0 93.8  PLT 379 278 278 282 248 093   Basic Metabolic Panel:  Recent Labs Lab 04/10/17 2200  04/11/17 0905 04/11/17 1953 04/12/17 0553 04/13/17 0652 04/14/17 0731  NA 137  < > 134* 141 137 136 134*  K 2.5*  < > 2.6* 5.0 4.9 4.2 3.8  CL 101  < > 101 113* 110 107 105  CO2 25  < > 23 23 22 23 24   GLUCOSE 134*  < > 130* 131* 105* 140* 142*  BUN 8  < > 5* 7 6 6 7   CREATININE 0.96  < > 0.85 0.80 0.84 0.87 0.89  CALCIUM 10.7*  < > 9.8 10.1 9.8 10.3 9.4  MG 1.7  --  2.1  --   2.0 1.7  --   < > = values in this interval not displayed. GFR: Estimated Creatinine Clearance: 64.1 mL/min (by C-G formula based on SCr of 0.89 mg/dL). Liver Function Tests:  Recent Labs Lab 04/10/17 2200  AST 18  ALT 13*  ALKPHOS 70  BILITOT 1.2  PROT 7.9  ALBUMIN 4.0   No results for input(s): LIPASE, AMYLASE in the last 168 hours. No results for input(s): AMMONIA in the last 168 hours. Coagulation Profile: No results for input(s): INR, PROTIME in the last 168 hours. Cardiac Enzymes: No results for input(s): CKTOTAL, CKMB, CKMBINDEX, TROPONINI in the last 168 hours. BNP (last 3 results) No results for input(s): PROBNP in the last 8760 hours. HbA1C: No results for input(s): HGBA1C in the last 72 hours. CBG: No results for input(s): GLUCAP in the last 168 hours. Lipid Profile: No results for input(s): CHOL, HDL, LDLCALC, TRIG, CHOLHDL, LDLDIRECT in the last 72 hours. Thyroid Function Tests: No results for input(s): TSH, T4TOTAL, FREET4, T3FREE, THYROIDAB in the last 72 hours. Anemia Panel: No results for input(s): VITAMINB12, FOLATE, FERRITIN, TIBC, IRON, RETICCTPCT in the last 72 hours. Sepsis Labs: No results for input(s): PROCALCITON, LATICACIDVEN in the last 168 hours.  Recent Results (from the past 240 hour(s))  MRSA PCR Screening     Status: Abnormal   Collection Time: 04/11/17  4:20 AM  Result Value Ref Range Status   MRSA by PCR POSITIVE (A) NEGATIVE Corrected    Comment: RESULT CALLED TO, READ BACK BY AND VERIFIED WITH: Calwa M. AT 0744A ON [id number] BY THOMPSON S.        The GeneXpert MRSA Assay (FDA approved for NASAL specimens only), is one component of a comprehensive MRSA colonization surveillance program. It is not intended to diagnose MRSA infection nor to guide or monitor treatment for MRSA infections. CORRECTED ON 08/08 AT 1057: PREVIOUSLY REPORTED AS RESULT CALLED TO, READ BACK BY AND VERIFIED WITH: HOWERTON M. AT [place] ON [id number] BY THOMPSON  S.        The GeneXpert MRSA Assay (FDA approved for NASAL specimens only), is one component of a  comprehensive MRSA colonization surveillance program. It is not intended to diagnose MRSA infection nor to guide or monitor treatment for MRSA infections.   Culture, blood (Routine X 2) w Reflex to ID Panel     Status: None (Preliminary result)   Collection Time: 04/11/17  9:05 AM  Result Value Ref Range Status  Specimen Description BLOOD RIGHT [place]  Final   Special Requests   Final    BOTTLES DRAWN AEROBIC AND ANAEROBIC Blood Culture results may not be optimal due to an inadequate volume of blood received in culture bottles   Culture NO GROWTH 3 DAYS  Final   Report Status PENDING  Incomplete  Culture, blood (Routine X 2) w Reflex to ID Panel     Status: None (Preliminary result)   Collection Time: 04/11/17  9:07 AM  Result Value Ref Range Status   Specimen Description BLOOD RIGHT [place]  Final   Special Requests   Final    BOTTLES DRAWN AEROBIC AND ANAEROBIC Blood Culture adequate volume   Culture NO GROWTH 3 DAYS  Final   Report Status PENDING  Incomplete  C difficile quick scan w PCR reflex     Status: None   Collection Time: 04/12/17 12:59 AM  Result Value Ref Range Status   C Diff antigen NEGATIVE NEGATIVE Final   C Diff toxin NEGATIVE NEGATIVE Final   C Diff interpretation No C. difficile detected.  Final  Culture, blood (Routine X 2) w Reflex to ID Panel     Status: None (Preliminary result)   Collection Time: 04/13/17  3:19 PM  Result Value Ref Range Status   Specimen Description BLOOD BLOOD RIGHT [place]  Final   Special Requests   Final    BOTTLES DRAWN AEROBIC AND ANAEROBIC Blood Culture adequate volume   Culture  Setup Time   Final    GRAM POSITIVE COCCI IN CLUSTERS Gram Stain Report Called to,Read Back By and Verified With: WATKINS,T AT 0930 BY HUFFINES,S ON 04/14/17. AEROBIC BOTTLE ONLY Organism ID to follow Performed at [facility], [place] [address] [place],  [place] [address]    Culture GRAM POSITIVE COCCI  Final   Report Status PENDING  Incomplete  Culture, blood (Routine X 2) w Reflex to ID Panel     Status: None (Preliminary result)   Collection Time: 04/13/17  3:29 PM  Result Value Ref Range Status   Specimen Description BLOOD BLOOD RIGHT ARM  Final   Special Requests   Final    BOTTLES DRAWN AEROBIC AND ANAEROBIC Blood Culture adequate volume   Culture NO GROWTH < 24 HOURS  Final   Report Status PENDING  Incomplete         Radiology Studies: Dg Chest 2 View  Result Date: 04/13/2017 CLINICAL DATA:  Leukocytosis, history COPD, hypertension, recent 2 week hospitalization for acute diverticulitis with abscess post percutaneous drainage EXAM: CHEST  2 VIEW COMPARISON:  04/10/2017 FINDINGS: Upper normal heart size. Slight pulmonary vascular congestion. Atherosclerotic calcification aorta. Mild bronchitic changes with persistent elevation of RIGHT diaphragm and basilar atelectasis. No acute infiltrate, pleural effusion or pneumothorax. Bones demineralized. IMPRESSION: Mild bronchitic changes and RIGHT basilar atelectasis. No acute infiltrate. Aortic Atherosclerosis (ICD10-I70.0). Electronically Signed   By: Lavonia Dana M.D.   On: 04/13/2017 17:38        Scheduled Meds: . ALPRAZolam  1 mg Oral TID  . atorvastatin  10 mg Oral Daily  . Chlorhexidine Gluconate Cloth  6 each Topical Q0600  . clotrimazole  1 Applicatorful Vaginal QHS  . dicyclomine  10 mg Oral TID AC & HS  . donepezil  10 mg Oral Q2000  . enoxaparin (LOVENOX) injection  40 mg Subcutaneous Q24H  . feeding supplement (ENSURE ENLIVE)  237 mL Oral BID BM  . fluconazole  150 mg Oral Daily  . isosorbide mononitrate  15 mg Oral Q breakfast  . lithium carbonate  300 mg Oral Q24H  . lithium carbonate  450 mg Oral Q breakfast  . metoprolol succinate  25 mg Oral Q breakfast  . multivitamin with minerals  1 tablet Oral Daily  . mupirocin ointment  1 application Nasal BID  . oxyCODONE  20  mg Oral Q12H  . pantoprazole  40 mg Oral BID AC  . traZODone  50 mg Oral Q2000   Continuous Infusions: . 0.9 % NaCl with KCl 20 mEq / L 70 mL/hr at 04/13/17 1528  . ciprofloxacin 400 mg (04/14/17 1330)  . metronidazole 500 mg (04/14/17 1330)  . vancomycin       LOS: 3 days    Time spent: 35 minutes    Rexene Alberts, MD [facility] Pager [phone]  If 7PM-7AM, please contact night-coverage [email or web address] Password [email or web address] 04/14/2017, 2:11 PM

## 2017-04-14 NOTE — Care Management Important Message (Signed)
Important Message  Patient Details  Name: Kathleen Valenzuela MRN: [id number] Date of Birth: [birth year]   Medicare Important Message Given:  Yes    Sherald Barge, RN 04/14/2017, 3:16 PM

## 2017-04-14 NOTE — Progress Notes (Signed)
PT Cancellation Note  Patient Details Name: Kathleen Valenzuela MRN: [id number] DOB: 1950-11-02   Cancelled Treatment:    Reason Eval/Treat Not Completed: Other (comment).  Refused and awaiting pain meds, will try later.   Ramond Dial 04/14/2017, 9:44 AM   9:44 AM, 04/14/17 Mee Hives, PT, MS Physical Therapist - [place] [phone] (Office)

## 2017-04-14 NOTE — Evaluation (Signed)
Physical Therapy Evaluation Patient Details Name: Kathleen Valenzuela MRN: [id number] DOB: 1951-08-31 Today's Date: 04/14/2017   History of Present Illness  66 yo female with onset of LUE phlebitis and atelectasis/bronchitis was admitted for management of low K+ and O2 after a percutaneous drainage of diverticular abscess was done and pt did not recover well.  PMHx:  leukocytosis, COPD, HTN, diverticulitis with abscess removed, bipolar, dementia.  Clinical Impression  Pt was seen for evaluation of mobility after her initial attempt was held due to pain.  Her LUE is sensitive and has phlebitis, and nursing had not brought more meds in since AM due to her somnolence since then.  Her visit was good in that she can move with minor help now but her pulse is excessively high, creating a limit on standing and gait.  Pt likely can do this and will attempt more mobility on Aug 13, next visit.    Follow Up Recommendations Home health PT;Supervision for mobility/OOB;Supervision/Assistance - 24 hour    Equipment Recommendations  None recommended by PT (not enough mobility was done to determine a walking device)    Recommendations for Other Services       Precautions / Restrictions Precautions Precautions: Fall Precaution Comments: ck pulses Restrictions Weight Bearing Restrictions: No Other Position/Activity Restrictions: LUE is painful and pressure not tolerated      Mobility  Bed Mobility Overal bed mobility: Needs Assistance Bed Mobility: Supine to Sit;Sit to Supine     Supine to sit: Min assist;Mod assist Sit to supine: Mod assist   General bed mobility comments: pt avoids any use of LUE but reaches to assist herself with RUE  Transfers                 General transfer comment: deferred due to high pulses  Ambulation/Gait             General Gait Details: deferred due to pulse  Stairs            Wheelchair Mobility    Modified Rankin (Stroke Patients Only)        Balance Overall balance assessment: Needs assistance Sitting-balance support: Feet supported;Single extremity supported Sitting balance-Leahy Scale: Fair                                       Pertinent Vitals/Pain Pain Assessment: Faces Faces Pain Scale: Hurts whole lot Pain Location: LUE with any movement Pain Descriptors / Indicators: Grimacing Pain Intervention(s): Limited activity within patient's tolerance;Monitored during session;Repositioned    Home Living Family/patient expects to be discharged to:: Assisted living               Home Equipment: Other (comment) (Pt is not able to report)      Prior Function Level of Independence: Needs assistance   Gait / Transfers Assistance Needed: Pt was not able to talk with PT about PLOF with gait  ADL's / Homemaking Assistance Needed: lives in ALF for housework and cooking assist, medicine management        Grall Dominance   Dominant Stolp: Right    Extremity/Trunk Assessment   Upper Extremity Assessment Upper Extremity Assessment: LUE deficits/detail LUE Deficits / Details: edematous and painful to touch LUE: Unable to fully assess due to pain LUE Coordination: decreased fine motor;decreased gross motor    Lower Extremity Assessment Lower Extremity Assessment: Generalized weakness    Cervical / Trunk Assessment  Cervical / Trunk Assessment: Normal  Communication   Communication: No difficulties  Cognition Arousal/Alertness: Lethargic Behavior During Therapy: Flat affect Overall Cognitive Status: History of cognitive impairments - at baseline                                        General Comments General comments (skin integrity, edema, etc.): LUE has edema and fragile skin but no obvious weeping    Exercises     Assessment/Plan    PT Assessment Patient needs continued PT services  PT Problem List Decreased strength;Decreased range of motion;Decreased activity  tolerance;Decreased balance;Decreased mobility;Decreased coordination;Decreased cognition;Decreased knowledge of use of DME;Decreased safety awareness;Cardiopulmonary status limiting activity;Obesity;Decreased skin integrity;Pain       PT Treatment Interventions DME instruction;Gait training;Functional mobility training;Therapeutic exercise;Therapeutic activities;Balance training;Neuromuscular re-education;Patient/family education    PT Goals (Current goals can be found in the Care Plan section)  Acute Rehab PT Goals Patient Stated Goal: none stated PT Goal Formulation: Patient unable to participate in goal setting Time For Goal Achievement: 04/28/17 Potential to Achieve Goals: Good    Frequency Min 3X/week   Barriers to discharge Decreased caregiver support (will need staff assist for all mobility)      Co-evaluation               AM-PAC PT "6 Clicks" Daily Activity  Outcome Measure Difficulty turning over in bed (including adjusting bedclothes, sheets and blankets)?: Total Difficulty moving from lying on back to sitting on the side of the bed? : Total Difficulty sitting down on and standing up from a chair with arms (e.g., wheelchair, bedside commode, etc,.)?: Total Help needed moving to and from a bed to chair (including a wheelchair)?: A Lot Help needed walking in hospital room?: A Lot Help needed climbing 3-5 steps with a railing? : Total 6 Click Score: 8    End of Session Equipment Utilized During Treatment: Oxygen Activity Tolerance: Patient limited by fatigue;Patient limited by lethargy;Treatment limited secondary to medical complications (Comment) Patient left: in bed;with call bell/phone within reach;with bed alarm set Nurse Communication: Mobility status PT Visit Diagnosis: Muscle weakness (generalized) (M62.81);History of falling (Z91.81);Difficulty in walking, not elsewhere classified (R26.2)    Time: 1610-9604 PT Time Calculation (min) (ACUTE ONLY): 25  min   Charges:   PT Evaluation $PT Eval Low Complexity: 1 Low PT Treatments $Therapeutic Activity: 8-22 mins   PT G Codes:   PT G-Codes **NOT FOR INPATIENT CLASS** Functional Assessment Tool Used: AM-PAC 6 Clicks Basic Mobility    Ramond Dial 04/14/2017, 4:15 PM   Mee Hives, PT MS Acute Rehab Dept. Number: [place] and [place]

## 2017-04-14 NOTE — Care Management Note (Signed)
Case Management Note  Patient Details  Name: Kathleen Valenzuela MRN: [id number] Date of Birth: 1951-04-25  Subjective/Objective:                  Admitted with sepsis. From [facility] with [facility] RN/PT through [facility]. [facility] rep, aware of admission. CSW aware of pt coming from placement.   Action/Plan: Plan for return to ALF with resumption of [facility] services. Pt will need order for resumption of services. CSW to make arrangements for return to facility. DC not anticipated over weekend.   Expected Discharge Date:       04/16/2017           Expected Discharge Plan:  Assisted Living / Rest Home (with [facility] services)  In-House Referral:  Clinical Social Work  Discharge planning Services  CM Consult  Post Acute Care Choice:  Durable Medical Equipment Choice offered to:  Patient  HH Arranged:  Therapist, sports, PT HH Agency:  [facility]  Status of Service:    in progress, cont to follow.    Sherald Barge, RN 04/14/2017, 3:17 PM

## 2017-04-14 NOTE — Clinical Social Work Note (Signed)
Clinical Social Work Assessment  Patient Details  Name: Kathleen Valenzuela MRN: [id number] Date of Birth: [birth year]  Date of referral:  04/14/17               Reason for consult:  Discharge Planning                Permission sought to share information with:    Permission granted to share information::     Name::        Agency::  Clarise Cruz at [facility])   Relationship::     Contact Information:     Housing/Transportation Living arrangements for the past 2 months:  [place] of Information:  Patient, Facility Patient Interpreter Needed:  None Criminal Activity/Legal Involvement Pertinent to Current Situation/Hospitalization:  No - Comment as needed Significant Relationships:  Adult Children Lives with:  Facility Resident Do you feel safe going back to the place where you live?  Yes Need for family participation in patient care:  Yes (Comment)  Care giving concerns:  None identified. Facility resident.    Social Worker assessment / plan: LCSW spoke with patient and Clarise Cruz from [place] [facility]). Patient has been at [facility] for about 6 years. At baseline she is independent in ambulation and basically independent in ADLs.  Patient can return to the facility at baseline.  Patient's son is supportive.   Employment status:  Contractor, Medicaid In [place] PT Recommendations:  Not assessed at this time Information / Referral to community resources:     Patient/Family's Response to care:  Patient has been at the facility for six years and plans on returning at discharge.   Patient/Family's Understanding of and Emotional Response to Diagnosis, Current Treatment, and Prognosis:  Patient has some understanding of her diagnosis, treatment and prognosis.   Emotional Assessment Appearance:  Appears stated age Attitude/Demeanor/Rapport:    Affect (typically observed):  Accepting Orientation:  Oriented to Self, Oriented to  Place Alcohol / Substance use:  Not Applicable Psych involvement (Current and /or in the community):  No (Comment)  Discharge Needs  Concerns to be addressed:  Discharge Planning Concerns Readmission within the last 30 days:  No Current discharge risk:  None Barriers to Discharge:  No Barriers Identified   Ihor Gully, LCSW 04/14/2017, 2:50 PM

## 2017-04-14 NOTE — Progress Notes (Signed)
Pharmacy Antibiotic Note  Kathleen Valenzuela is a 66 y.o. female admitted on 04/10/2017 with Fever.  Pharmacy has been consulted for Vancomycin dosing.  Plan: Vancomycin 1500mg  x 1 then 1000mg  IV q12hrs Monitor labs, progress, c/s  Height: 5\' 2"  (157.5 cm) Weight: 189 lb 3.2 oz (85.8 kg) IBW/kg (Calculated) : 50.1  Temp (24hrs), Avg:99.8 F (37.7 C), Min:99.5 F (37.5 C), Max:100.1 F (37.8 C)   Recent Labs Lab 04/11/17 0247 04/11/17 0905 04/11/17 1953 04/12/17 0553 04/13/17 0652 04/13/17 1137 04/14/17 0731  WBC 17.6*  --   --  13.4* 17.6* 17.0* 15.4*  CREATININE 1.00 0.85 0.80 0.84 0.87  --  0.89    Estimated Creatinine Clearance: 64.1 mL/min (by C-G formula based on SCr of 0.89 mg/dL).    Allergies  Allergen Reactions  . Penicillins Rash    Has patient had a PCN reaction causing immediate rash, facial/tongue/throat swelling, SOB or lightheadedness with hypotension: Yes Has patient had a PCN reaction causing severe rash involving mucus membranes or skin necrosis: No Has patient had a PCN reaction that required hospitalization: No Has patient had a PCN reaction occurring within the last 10 years: No If all of the above answers are "NO", then may proceed with Cephalosporin use.    Antimicrobials this admission: Vancomycin 8/10 >>  Cipro and Flagyl 8/9 >>   Dose adjustments this admission:  Microbiology results:  BCx: pending  UCx:    Sputum:    MRSA PCR: positive  Thank you for allowing pharmacy to be a part of this patient's care.  Hart Robinsons A 04/14/2017 1:01 PM

## 2017-04-15 ENCOUNTER — Inpatient Hospital Stay (HOSPITAL_COMMUNITY): Payer: Medicare Other

## 2017-04-15 ENCOUNTER — Encounter (HOSPITAL_COMMUNITY): Payer: Self-pay | Admitting: Internal Medicine

## 2017-04-15 DIAGNOSIS — I5032 Chronic diastolic (congestive) heart failure: Secondary | ICD-10-CM

## 2017-04-15 LAB — CBC
HEMATOCRIT: 38.2 % (ref 36.0–46.0)
HEMOGLOBIN: 12.3 g/dL (ref 12.0–15.0)
MCH: 30.8 pg (ref 26.0–34.0)
MCHC: 32.2 g/dL (ref 30.0–36.0)
MCV: 95.7 fL (ref 78.0–100.0)
Platelets: 173 10*3/uL (ref 150–400)
RBC: 3.99 MIL/uL (ref 3.87–5.11)
RDW: 14.7 % (ref 11.5–15.5)
WBC: 15.4 10*3/uL — AB (ref 4.0–10.5)

## 2017-04-15 LAB — BASIC METABOLIC PANEL
Anion gap: 7 (ref 5–15)
BUN: 5 mg/dL — ABNORMAL LOW (ref 6–20)
CHLORIDE: 107 mmol/L (ref 101–111)
CO2: 25 mmol/L (ref 22–32)
Calcium: 9.4 mg/dL (ref 8.9–10.3)
Creatinine, Ser: 0.74 mg/dL (ref 0.44–1.00)
GFR calc non Af Amer: >60 mL/min (ref >60)
Glucose, Bld: 107 mg/dL — ABNORMAL HIGH (ref 65–99)
POTASSIUM: 4 mmol/L (ref 3.5–5.1)
Sodium: 139 mmol/L (ref 135–145)

## 2017-04-15 LAB — CULTURE, BLOOD (ROUTINE X 2): Special Requests: ADEQUATE

## 2017-04-15 LAB — ECHOCARDIOGRAM COMPLETE
HEIGHTINCHES: 62 in
Weight: 3027.2 oz

## 2017-04-15 MED ORDER — ENSURE ENLIVE PO LIQD
237.0000 mL | Freq: Three times a day (TID) | ORAL | Status: DC
  Administered 2017-04-15 – 2017-04-17 (×7): 237 mL via ORAL

## 2017-04-15 NOTE — Progress Notes (Addendum)
PROGRESS NOTE    Kathleen Valenzuela  IDP:[id number] DOB: Mar 13, 1951 DOA: 04/10/2017 PCP: Patient, No Pcp Per    Brief Narrative:   Kathleen Valenzuela is a 66 y.o. female with medical history significant of bipolar disorder, early dementia (she and her son deny this but she is on aricept and its in her chart), COPD, HTN and with a recent 2 week hospitalization at [facility] for acute diverticulitis with absess s/p percutaneous drainage of the absess.   she was discharged from there on 04/04/17 on cipro and flagyl orally.  When she went home she was eating okay but not back to normal eating.  She had no diarrhea. She had no pain. After about 3 days of being at SNF, she started having  diarrhea and vomiting.  She had not been able to keep anything down for days due to nausea all the time. She denied fever and chills. She denied abdominal pain.  In the ED, she was afebrile and hemodynamically stable. Her lab data were significant for a serum potassium of 2.2, white blood cell count of 20.6, and CT scan of the abdomen/pelvis which revealed a smaller intramural diverticular abscess. She was admitted for further evaluation and management.  Assessment & Plan:   Principal Problem:   Hypokalemia Active Problems:   Nausea vomiting and diarrhea   MRSA bacteremia   Diverticulitis of large intestine with abscess   Essential hypertension   Bipolar disorder (HCC)   COPD (chronic obstructive pulmonary disease) (HCC)   Vaginal candidiasis   Acute lower UTI   1. Severe hypokalemia Patient was started on multiple potassium runs. When she was able to take medications by mouth without N/V, oral potassium was given. -Magnesium level was assessed and it was found to be low-normal. 2 g of magnesium sulfate was given. -Both her serum potassium and magnesium improved. -The likely etiology of hyperkalemia was due to GI losses.  Diverticulitis with abscess. Patient was apparently hospitalized at [facility] for  diverticulitis with abscess. She was treated with Cipro and Flagyl and subsequently discharged to the SNF on oral Cipro/Flagyl. -On admission, she was afebrile, but her white blood cell count was 20.6. -In the ED, CT scan of her abdomen and pelvis revealed a smaller intramural diverticular abscess estimated to by 1.6 cm versus 3.6 x 3 cm since the prior exam. There was no evidence of bowel obstruction or recurrent or new inflammatory process. -The patient was restarted on IV Cipro and Flagyl. Her white blood cell count decreased to 13.4. When she improved clinically they were transitioned to by mouth. -However in light of the low-grade fever and increase in her white blood cell count on 8/9, IV Cipro and Flagyl were restarted. - Bentyl was decreased to 10 mg. She denies abdominal pain; her abdomen is soft and nonacute on exam.  MRSA bacteremia. -Patient became febrile and her resolving leukocytosis increased again. -Workup included urinalysis, chest x-ray, and another set of blood cultures. (Chest x-ray revealed no infiltrate). -Blood cultures 1 on 8/9 became positive for MRSA. (Blood cultures on 8/7 have remained negative to date). -Vancomycin started empirically prior to blood culture results on 8/9. Sensitivities pending. -PICC line ordered. Another set of blood cultures ordered for 8/11. -We'll order 2-D echocardiogram. Patient may need a TEE. -  UTI -Urinalysis was ordered for fever and leukocytosis. It was positive for infection. -Urine culture sent after vancomycin was started. -We'll continue Cipro, Flagyl, vancomycin. -Await culture results which may be sterile.  Nausea/vomiting/diarrhea. Patient  was started on IV fluids for hydration, as needed antiemetics, and as needed analgesics. Protonix was continued. -C. difficile PCR was ordered and it was negative. -Her symptoms subsided and then resolved. -Her diet was advanced from clear liquids to a soft diet which she appears to be  tolerating. Poor appetite.  Essential hypertension with query CAD/CHF history. Patient is treated chronically with Toprol-XL, isosorbide mononitrate, Lasix, and Lipitor. They were all continued on admission with the exception of Lasix due to GI losses. -Her blood pressures have been soft over the past 24-48 hours, therefore, Toprol-XL was decreased to 25 mg daily and Imdur was decreased to 15 mg daily. -Restart Lasix when blood pressure has improved to above 163 systolically consistently or when clinically appropriate. -IV fluid rate was increased temporarily. (Patient receiving fluids with 3 IV antibiotics).  Vaginal candidiasis. The patient had previously complained of vaginal itching and a rash. There is mild macular erythematous rash of her perineum and inner thighs, consistent with vaginal candidiasis. -Oral Diflucan and topical nystatin were started. -We'll continue oral Diflucan today then discontinue; completing a 5 day course. -She is symptomatically improved.  Mild hyperglycemia. Patient has no history of diabetes. Her venous glucose is modestly elevated. -Hemoglobin A1c is pending. Will continue to monitor. Hold off on insulin for now.  Left arm phlebitis. Nurse reported that the previous IV in her left arm infiltrated and caused some edema and pain on 8/10. -Warm compresses applied. Arm elevation was discussed with nursing. Edema and tenderness are subsiding..  Bipolar disorder. The patient is treated chronically with lithium, Xanax, and trazodone. They were all continued. Currently stable.   DVT prophylaxis: SCDs>>>Lovenox Code Status: Full code Family Communication: Discussed with son on 8/10; follow-up discussion pending Disposition Plan: Discharge to ALF when clinically appropriate, pending further workup.   Consultants:   None  Procedures:   None  Antimicrobials:   Vancomycin 8/9>>  Cipro 8/7>>  Flagyl 8/7>>   Subjective: Patient denies abdominal  pain, nausea, and vomiting. She had a soft bowel movement this morning. She is worried about the "blood infection".   Objective: Vitals:   04/14/17 0532 04/14/17 1009 04/14/17 2138 04/15/17 0626  BP: (!) 98/52 (!) 94/54 100/63 (!) 110/58  Pulse: 95 87 67 64  Resp: 16  20 20   Temp: 99.7 F (37.6 C)  99.1 F (37.3 C) 99.2 F (37.3 C)  TempSrc: Oral  Oral Oral  SpO2: 97%  98% 97%  Weight:      Height:        Intake/Output Summary (Last 24 hours) at 04/15/17 1041 Last data filed at 04/15/17 1011  Gross per 24 hour  Intake              500 ml  Output              500 ml  Net                0 ml   Filed Weights   04/10/17 1922 04/11/17 0242  Weight: 86.2 kg (190 lb) 85.8 kg (189 lb 3.2 oz)    Examination:  General exam: Appears comfortable.  Respiratory system: Occasional crackles on the right. Respiratory effort normal. Cardiovascular system: S1 & S2 heard, soft systolic murmur. No pedal edema. Gastrointestinal system: Abdomen is nondistended, soft and without tenderness. No organomegaly or masses felt. Normal bowel sounds heard. GU: Mild-resolving erythema of the perineum and proximal inner thighs. Central nervous system: Alert and oriented. No focal neurological deficits. Extremities/musculoskeletal:  She is moving all of her extremities spontaneously. No acute hot red joints. Skin: Superficial mild erythema, warmth and edema of the left arm. Radial pulse palpable. Good range of motion of left upper extremity. Psychiatry: Judgement and insight appear normal. Slightly anxious about the "blood infection".    Data Reviewed: I have personally reviewed following labs and imaging studies  CBC:  Recent Labs Lab 04/10/17 2200  04/12/17 0553 04/13/17 0652 04/13/17 1137 04/14/17 0731 04/15/17 0652  WBC 20.6*  < > 13.4* 17.6* 17.0* 15.4* 15.4*  NEUTROABS 16.3*  --  8.8*  --   --   --   --   HGB 15.0  < > 12.8 13.3 12.7 11.7* 12.3  HCT 42.7  < > 39.1 40.0 38.5 36.2 38.2    MCV 87.9  < > 93.3 92.8 93.0 93.8 95.7  PLT 379  < > 278 282 248 198 173  < > = values in this interval not displayed. Basic Metabolic Panel:  Recent Labs Lab 04/10/17 2200  04/11/17 0905 04/11/17 1953 04/12/17 0553 04/13/17 0652 04/14/17 0731 04/15/17 0652  NA 137  < > 134* 141 137 136 134* 139  K 2.5*  < > 2.6* 5.0 4.9 4.2 3.8 4.0  CL 101  < > 101 113* 110 107 105 107  CO2 25  < > 23 23 22 23 24 25   GLUCOSE 134*  < > 130* 131* 105* 140* 142* 107*  BUN 8  < > 5* 7 6 6 7  5*  CREATININE 0.96  < > 0.85 0.80 0.84 0.87 0.89 0.74  CALCIUM 10.7*  < > 9.8 10.1 9.8 10.3 9.4 9.4  MG 1.7  --  2.1  --  2.0 1.7  --   --   < > = values in this interval not displayed. GFR: Estimated Creatinine Clearance: 71.3 mL/min (by C-G formula based on SCr of 0.74 mg/dL). Liver Function Tests:  Recent Labs Lab 04/10/17 2200  AST 18  ALT 13*  ALKPHOS 70  BILITOT 1.2  PROT 7.9  ALBUMIN 4.0   No results for input(s): LIPASE, AMYLASE in the last 168 hours. No results for input(s): AMMONIA in the last 168 hours. Coagulation Profile: No results for input(s): INR, PROTIME in the last 168 hours. Cardiac Enzymes: No results for input(s): CKTOTAL, CKMB, CKMBINDEX, TROPONINI in the last 168 hours. BNP (last 3 results) No results for input(s): PROBNP in the last 8760 hours. HbA1C: No results for input(s): HGBA1C in the last 72 hours. CBG: No results for input(s): GLUCAP in the last 168 hours. Lipid Profile: No results for input(s): CHOL, HDL, LDLCALC, TRIG, CHOLHDL, LDLDIRECT in the last 72 hours. Thyroid Function Tests: No results for input(s): TSH, T4TOTAL, FREET4, T3FREE, THYROIDAB in the last 72 hours. Anemia Panel: No results for input(s): VITAMINB12, FOLATE, FERRITIN, TIBC, IRON, RETICCTPCT in the last 72 hours. Sepsis Labs: No results for input(s): PROCALCITON, LATICACIDVEN in the last 168 hours.  Recent Results (from the past 240 hour(s))  MRSA PCR Screening     Status: Abnormal    Collection Time: 04/11/17  4:20 AM  Result Value Ref Range Status   MRSA by PCR POSITIVE (A) NEGATIVE Corrected    Comment: RESULT CALLED TO, READ BACK BY AND VERIFIED WITH: Malone M. AT [id number] ON [id number] BY THOMPSON S.        The GeneXpert MRSA Assay (FDA approved for NASAL specimens only), is one component of a comprehensive MRSA colonization surveillance program. It is not intended  to diagnose MRSA infection nor to guide or monitor treatment for MRSA infections. CORRECTED ON 08/08 AT 1057: PREVIOUSLY REPORTED AS RESULT CALLED TO, READ BACK BY AND VERIFIED WITH: HOWERTON M. AT [place] ON [id number] BY THOMPSON S.        The GeneXpert MRSA Assay (FDA approved for NASAL specimens only), is one component of a  comprehensive MRSA colonization surveillance program. It is not intended to diagnose MRSA infection nor to guide or monitor treatment for MRSA infections.   Culture, blood (Routine X 2) w Reflex to ID Panel     Status: None (Preliminary result)   Collection Time: 04/11/17  9:05 AM  Result Value Ref Range Status   Specimen Description BLOOD RIGHT [place]  Final   Special Requests   Final    BOTTLES DRAWN AEROBIC AND ANAEROBIC Blood Culture results may not be optimal due to an inadequate volume of blood received in culture bottles   Culture NO GROWTH 4 DAYS  Final   Report Status PENDING  Incomplete  Culture, blood (Routine X 2) w Reflex to ID Panel     Status: None (Preliminary result)   Collection Time: 04/11/17  9:07 AM  Result Value Ref Range Status   Specimen Description BLOOD RIGHT [place]  Final   Special Requests   Final    BOTTLES DRAWN AEROBIC AND ANAEROBIC Blood Culture adequate volume   Culture NO GROWTH 4 DAYS  Final   Report Status PENDING  Incomplete  C difficile quick scan w PCR reflex     Status: None   Collection Time: 04/12/17 12:59 AM  Result Value Ref Range Status   C Diff antigen NEGATIVE NEGATIVE Final   C Diff toxin NEGATIVE NEGATIVE Final   C Diff interpretation  No C. difficile detected.  Final  Culture, blood (Routine X 2) w Reflex to ID Panel     Status: None (Preliminary result)   Collection Time: 04/13/17  3:19 PM  Result Value Ref Range Status   Specimen Description BLOOD BLOOD RIGHT [place]  Final   Special Requests   Final    BOTTLES DRAWN AEROBIC AND ANAEROBIC Blood Culture adequate volume   Culture  Setup Time   Final    GRAM POSITIVE COCCI IN CLUSTERS Gram Stain Report Called to,Read Back By and Verified With: WATKINS,T AT 0930 BY HUFFINES,S ON 04/14/17. AEROBIC BOTTLE ONLY Organism ID to follow CRITICAL RESULT CALLED TO, READ BACK BY AND VERIFIED WITH: S. Hall Pharm.D. 14:55 04/14/17 ([email or web address]) Performed at [facility], [place] [address] [place], [place] [address]    Culture GRAM POSITIVE COCCI  Final   Report Status PENDING  Incomplete  Blood Culture ID Panel (Reflexed)     Status: Abnormal   Collection Time: 04/13/17  3:19 PM  Result Value Ref Range Status   Enterococcus species NOT DETECTED NOT DETECTED Final   Vancomycin resistance NOT DETECTED NOT DETECTED Final   Listeria monocytogenes NOT DETECTED NOT DETECTED Final   Staphylococcus species DETECTED (A) NOT DETECTED Final    Comment: Methicillin (oxacillin) resistant coagulase negative staphylococcus. Possible blood culture contaminant (unless isolated from more than one blood culture draw or clinical case suggests pathogenicity). No antibiotic treatment is indicated for blood  culture contaminants. CRITICAL RESULT CALLED TO, READ BACK BY AND VERIFIED WITH: S. Hall Pharm.D. 14:55 04/14/17 ([email or web address])    Staphylococcus aureus NOT DETECTED NOT DETECTED Final   Methicillin resistance DETECTED (A) NOT DETECTED Final    Comment: CRITICAL RESULT CALLED TO, READ  BACK BY AND VERIFIED WITH: S. [place] Crane Pharm.D. 14:55 04/14/17 ([email or web address])    Streptococcus species NOT DETECTED NOT DETECTED Final   Streptococcus agalactiae NOT DETECTED NOT DETECTED Final   Streptococcus pneumoniae NOT DETECTED  NOT DETECTED Final   Streptococcus pyogenes NOT DETECTED NOT DETECTED Final   Acinetobacter baumannii NOT DETECTED NOT DETECTED Final   Enterobacteriaceae species NOT DETECTED NOT DETECTED Final   Enterobacter cloacae complex NOT DETECTED NOT DETECTED Final   Escherichia coli NOT DETECTED NOT DETECTED Final   Klebsiella oxytoca NOT DETECTED NOT DETECTED Final   Klebsiella pneumoniae NOT DETECTED NOT DETECTED Final   Proteus species NOT DETECTED NOT DETECTED Final   Serratia marcescens NOT DETECTED NOT DETECTED Final   Carbapenem resistance NOT DETECTED NOT DETECTED Final   Haemophilus influenzae NOT DETECTED NOT DETECTED Final   Neisseria meningitidis NOT DETECTED NOT DETECTED Final   Pseudomonas aeruginosa NOT DETECTED NOT DETECTED Final   Candida albicans NOT DETECTED NOT DETECTED Final   Candida glabrata NOT DETECTED NOT DETECTED Final   Candida krusei NOT DETECTED NOT DETECTED Final   Candida parapsilosis NOT DETECTED NOT DETECTED Final   Candida tropicalis NOT DETECTED NOT DETECTED Final    Comment: Performed at [facility], [place] [address], [place], [place] [address]  Culture, blood (Routine X 2) w Reflex to ID Panel     Status: None (Preliminary result)   Collection Time: 04/13/17  3:29 PM  Result Value Ref Range Status   Specimen Description BLOOD BLOOD RIGHT ARM  Final   Special Requests   Final    BOTTLES DRAWN AEROBIC AND ANAEROBIC Blood Culture adequate volume   Culture NO GROWTH 2 DAYS  Final   Report Status PENDING  Incomplete         Radiology Studies: Dg Chest 2 View  Result Date: 04/13/2017 CLINICAL DATA:  Leukocytosis, history COPD, hypertension, recent 2 week hospitalization for acute diverticulitis with abscess post percutaneous drainage EXAM: CHEST  2 VIEW COMPARISON:  04/10/2017 FINDINGS: Upper normal heart size. Slight pulmonary vascular congestion. Atherosclerotic calcification aorta. Mild bronchitic changes with persistent elevation of RIGHT  diaphragm and basilar atelectasis. No acute infiltrate, pleural effusion or pneumothorax. Bones demineralized. IMPRESSION: Mild bronchitic changes and RIGHT basilar atelectasis. No acute infiltrate. Aortic Atherosclerosis (ICD10-I70.0). Electronically Signed   By: Lavonia Dana M.D.   On: 04/13/2017 17:38        Scheduled Meds: . ALPRAZolam  1 mg Oral TID  . atorvastatin  10 mg Oral Daily  . Chlorhexidine Gluconate Cloth  6 each Topical Q0600  . clotrimazole  1 Applicatorful Vaginal QHS  . dicyclomine  10 mg Oral TID AC & HS  . donepezil  10 mg Oral Q2000  . enoxaparin (LOVENOX) injection  40 mg Subcutaneous Q24H  . feeding supplement (ENSURE ENLIVE)  237 mL Oral BID BM  . fluconazole  150 mg Oral Daily  . isosorbide mononitrate  15 mg Oral Q breakfast  . lithium carbonate  300 mg Oral Q24H  . lithium carbonate  450 mg Oral Q breakfast  . metoprolol succinate  25 mg Oral Q breakfast  . multivitamin with minerals  1 tablet Oral Daily  . mupirocin ointment  1 application Nasal BID  . oxyCODONE  20 mg Oral Q12H  . pantoprazole  40 mg Oral BID AC  . traZODone  50 mg Oral Q2000   Continuous Infusions: . 0.9 % NaCl with KCl 20 mEq / L 100 mL/hr at 04/14/17 1637  .  ciprofloxacin Stopped (04/15/17 0313)  . metronidazole 500 mg (04/15/17 0651)  . vancomycin 1,000 mg (04/15/17 0654)     LOS: 4 days    Time spent: 69 minutes    Rexene Alberts, MD [facility] Pager [phone]  If 7PM-7AM, please contact night-coverage [email or web address] Password [facility] 04/15/2017, 10:41 AM

## 2017-04-15 NOTE — H&P (Signed)
Consult called for vascular access services.  MD states that patient can sign her own consent.

## 2017-04-15 NOTE — Progress Notes (Signed)
  Echocardiogram 2D Echocardiogram has been performed.  Kathleen Valenzuela 04/15/2017, 4:14 PM

## 2017-04-16 ENCOUNTER — Inpatient Hospital Stay (HOSPITAL_COMMUNITY): Payer: Medicare Other

## 2017-04-16 LAB — BASIC METABOLIC PANEL
ANION GAP: 4 — AB (ref 5–15)
BUN: <5 mg/dL — ABNORMAL LOW (ref 6–20)
CHLORIDE: 109 mmol/L (ref 101–111)
CO2: 27 mmol/L (ref 22–32)
Calcium: 9.4 mg/dL (ref 8.9–10.3)
Creatinine, Ser: 0.7 mg/dL (ref 0.44–1.00)
Glucose, Bld: 149 mg/dL — ABNORMAL HIGH (ref 65–99)
POTASSIUM: 3.6 mmol/L (ref 3.5–5.1)
SODIUM: 140 mmol/L (ref 135–145)

## 2017-04-16 LAB — CBC
HCT: 34.1 % — ABNORMAL LOW (ref 36.0–46.0)
Hemoglobin: 11.2 g/dL — ABNORMAL LOW (ref 12.0–15.0)
MCH: 31 pg (ref 26.0–34.0)
MCHC: 32.8 g/dL (ref 30.0–36.0)
MCV: 94.5 fL (ref 78.0–100.0)
PLATELETS: 190 10*3/uL (ref 150–400)
RBC: 3.61 MIL/uL — ABNORMAL LOW (ref 3.87–5.11)
RDW: 14.6 % (ref 11.5–15.5)
WBC: 14.7 10*3/uL — ABNORMAL HIGH (ref 4.0–10.5)

## 2017-04-16 LAB — CULTURE, BLOOD (ROUTINE X 2)
CULTURE: NO GROWTH
Culture: NO GROWTH
SPECIAL REQUESTS: ADEQUATE

## 2017-04-16 LAB — URINE CULTURE: CULTURE: NO GROWTH

## 2017-04-16 LAB — HEMOGLOBIN A1C
Hgb A1c MFr Bld: 5.2 % (ref 4.8–5.6)
Mean Plasma Glucose: 103 mg/dL

## 2017-04-16 MED ORDER — IOPAMIDOL (ISOVUE-300) INJECTION 61%
INTRAVENOUS | Status: AC
  Filled 2017-04-16: qty 30

## 2017-04-16 MED ORDER — IOPAMIDOL (ISOVUE-300) INJECTION 61%
100.0000 mL | Freq: Once | INTRAVENOUS | Status: AC | PRN
  Administered 2017-04-16: 100 mL via INTRAVENOUS

## 2017-04-16 NOTE — Progress Notes (Signed)
PROGRESS NOTE    Kathleen Valenzuela  FVC:[id number] DOB: May 02, 1951 DOA: 04/10/2017 PCP: Patient, No Pcp Per    Brief Narrative:   66 y.o.  bipolar disorder,  early dementia  COPD,  HTN and with a recent 2 week hospitalization at [facility] for acute diverticulitis with absess s/p percutaneous drainage of the absess.    discharged from there on 04/04/17 on cipro and flagyl orally.  Went home she was eating okay but not back to normal eating.  -diarrhea. - pain.  After about 3 days of being at SNF, she started having  diarrhea and vomiting.  She had not been able to keep anything down for days due to nausea all the time. She denied fever and chills. She denied abdominal pain.  In the ED, she was afebrile and hemodynamically stable. Her lab data were significant for a serum potassium of 2.2, white blood cell count of 20.6, and CT scan of the abdomen/pelvis which revealed a smaller intramural diverticular abscess. She was admitted for further evaluation and management.  Assessment & Plan:   Principal Problem:   Hypokalemia Active Problems:   Bipolar disorder (HCC)   Nausea vomiting and diarrhea   COPD (chronic obstructive pulmonary disease) (HCC)   Vaginal candidiasis   Diverticulitis of large intestine with abscess   Essential hypertension   Acute lower UTI   MRSA bacteremia   Severe hypokalemia Patient was started on multiple potassium runs. is without N/V, oral potassium was given. -Magnesium level was assessed and it was found to be low-normal. 2 g of magnesium sulfate was given. -Both her serum potassium and magnesium improved. -hypokalemia due to GI losses.  ID On admission, she was afebrile, but her white blood cell count was 20.6. MRSA bacteremia diagnosed 8/9 secondary to low-grade fever Blood cultures 1 on 8/9 became positive for MRSA. (Blood cultures on 8/7 have remained negative to date). Vancomycin started empirically prior to blood culture results on 8/9. Sensitivities  pending. PICC line ordered. blood cultures ordered for 8/11 are still pending Patient is edentulous so no Panorex  needed, for completion sake will need TEE and would consult in am 8/13 Cardiology UTI? -Urinalysis was positive for infection. -Urine culture sent after vancomycin was started. -Vancomycin monotherapy for MRSA bacteremia Diverticulitis with abscess- narrow off of ciprofloxacin and Flagyl [duration 8/7--->??]  RX [facility] for diverticulitis with abscess. She was treated with Cipro and Flagyl and subsequently discharged to the  SNF on oral Cipro/Flagyl.  CT scan=smaller intramural diverticular abscess estimated to by 1.6 cm versus 3.6 x 3 cm since the prior exam. There was  no  evidence of bowel obstruction or recurrent or new inflammatory process. Bentyl was decreased to 10 mg. She denies abdominal pain; her abdomen is soft and nonacute on exam. Repeat CT scan ordered 8/12 to determine size of abscess in comparison to 8/7 study--will seek guidance from infectious disease regarding duration of treatment once Ct back  Nausea/vomiting/diarrhea. Prior history of hiatal hernia 01/2017 on endoscopy [2 nonbleeding ulcers noted then] Esophageal stenosis status post dilatation at that time Patient was started on IV fluids for hydration, as needed antiemetics, and as needed analgesics. Protonix was continued. -C. difficile PCR was ordered and it was negative. -Her symptoms subsided and then resolved. -Her diet was advanced from clear liquids to a soft diet secondary to poor dentition which she appears to be tolerating. Poor appetite.  Essential hypertension with query CAD/CHF history. PTA meds = Toprol-XL, isosorbide mononitrate, Lasix, and Lipitor.  -  Toprol-XL was decreased to 25 mg daily and Imdur was decreased to 15 mg daily. -IV saline with 60 cc/h + KCL 20 meq, home medication of Lasix is on hold  Vaginal candidiasis-resolved completed a 5 day course.  Mild  hyperglycemia, HbA1c 5.2. Patient has no history of diabetes. Her venous glucose is modestly elevated  And would not cover at this juncture  Left arm phlebitis-resolved   Arm elevation was discussed with nursing. Edema and tenderness are subsiding..  Bipolar disorder. Chronic low back pain on prior to admission opiates The patient is treated chronically with lithium, Xanax, and trazodone.  She will probably need de-escalation of her meds and discussion with primary care physician/pain management regarding nonopiate modalities of treatment   DVT prophylaxis: SCDs>>>Lovenox Code Status: Full code Family Communication: called son-left VM 8/12. Disposition Plan: Discharge to ALF 3-4 days   Consultants:   None  Procedures:   None  Antimicrobials:   Vancomycin 8/9>>  Cipro 8/7>>  Flagyl 8/7>>   Subjective:  Alert Poor eye contact  Not eating much according to nursing About to receive her chronic pain meds Did not sleep well last night and is having back pain  No fever No nausea No vomiting  No chest pain    Objective: Vitals:   04/15/17 1510 04/15/17 1514 04/15/17 2127 04/16/17 0522  BP:  115/63 108/65 118/64  Pulse:  92 92 (!) 103  Resp:  20 20 20   Temp:  98.7 F (37.1 C) 99 F (37.2 C) 99.2 F (37.3 C)  TempSrc:   Oral Oral  SpO2: 96% 98% 98% 96%  Weight:      Height:        Intake/Output Summary (Last 24 hours) at 04/16/17 1012 Last data filed at 04/15/17 1856  Gross per 24 hour  Intake             2000 ml  Output                0 ml  Net             2000 ml   Filed Weights   04/10/17 1922 04/11/17 0242  Weight: 86.2 kg (190 lb) 85.8 kg (189 lb 3.2 oz)    Examination:  EOMI NCAT flat affect edentulous No JVD Chest is clinically clear no added sound S1-S2 no murmur rub or gallop Abdomen soft obese nontender nondistended no rebound or guarding also has heard No lower extremity edema No rash No CVA tenderness Neurologically flat affect  although moves all 4 limbs equally with equal power   Data Reviewed: I have personally reviewed following labs and imaging studies  CBC:  Recent Labs Lab 04/10/17 2200  04/12/17 0553 04/13/17 0652 04/13/17 1137 04/14/17 0731 04/15/17 0652 04/16/17 0657  WBC 20.6*  < > 13.4* 17.6* 17.0* 15.4* 15.4* 14.7*  NEUTROABS 16.3*  --  8.8*  --   --   --   --   --   HGB 15.0  < > 12.8 13.3 12.7 11.7* 12.3 11.2*  HCT 42.7  < > 39.1 40.0 38.5 36.2 38.2 34.1*  MCV 87.9  < > 93.3 92.8 93.0 93.8 95.7 94.5  PLT 379  < > 278 282 248 198 173 190  < > = values in this interval not displayed. Basic Metabolic Panel:  Recent Labs Lab 04/10/17 2200  04/11/17 0905  04/12/17 0553 04/13/17 1610 04/14/17 0731 04/15/17 0652 04/16/17 0657  NA 137  < > 134*  < >  137 136 134* 139 140  K 2.5*  < > 2.6*  < > 4.9 4.2 3.8 4.0 3.6  CL 101  < > 101  < > 110 107 105 107 109  CO2 25  < > 23  < > 22 23 24 25 27   GLUCOSE 134*  < > 130*  < > 105* 140* 142* 107* 149*  BUN 8  < > 5*  < > 6 6 7  5* <5*  CREATININE 0.96  < > 0.85  < > 0.84 0.87 0.89 0.74 0.70  CALCIUM 10.7*  < > 9.8  < > 9.8 10.3 9.4 9.4 9.4  MG 1.7  --  2.1  --  2.0 1.7  --   --   --   < > = values in this interval not displayed. GFR: Estimated Creatinine Clearance: 71.3 mL/min (by C-G formula based on SCr of 0.7 mg/dL). Liver Function Tests:  Recent Labs Lab 04/10/17 2200  AST 18  ALT 13*  ALKPHOS 70  BILITOT 1.2  PROT 7.9  ALBUMIN 4.0   No results for input(s): LIPASE, AMYLASE in the last 168 hours. No results for input(s): AMMONIA in the last 168 hours. Coagulation Profile: No results for input(s): INR, PROTIME in the last 168 hours. Cardiac Enzymes: No results for input(s): CKTOTAL, CKMB, CKMBINDEX, TROPONINI in the last 168 hours. BNP (last 3 results) No results for input(s): PROBNP in the last 8760 hours. HbA1C:  Recent Labs  04/15/17 0652  HGBA1C 5.2   CBG: No results for input(s): GLUCAP in the last 168  hours. Lipid Profile: No results for input(s): CHOL, HDL, LDLCALC, TRIG, CHOLHDL, LDLDIRECT in the last 72 hours. Thyroid Function Tests: No results for input(s): TSH, T4TOTAL, FREET4, T3FREE, THYROIDAB in the last 72 hours. Anemia Panel: No results for input(s): VITAMINB12, FOLATE, FERRITIN, TIBC, IRON, RETICCTPCT in the last 72 hours. Sepsis Labs: No results for input(s): PROCALCITON, LATICACIDVEN in the last 168 hours.   Radiology Studies: No results found.  Scheduled Meds: . ALPRAZolam  1 mg Oral TID  . atorvastatin  10 mg Oral Daily  . clotrimazole  1 Applicatorful Vaginal QHS  . dicyclomine  10 mg Oral TID AC & HS  . donepezil  10 mg Oral Q2000  . enoxaparin (LOVENOX) injection  40 mg Subcutaneous Q24H  . feeding supplement (ENSURE ENLIVE)  237 mL Oral TID BM  . isosorbide mononitrate  15 mg Oral Q breakfast  . lithium carbonate  300 mg Oral Q24H  . lithium carbonate  450 mg Oral Q breakfast  . metoprolol succinate  25 mg Oral Q breakfast  . multivitamin with minerals  1 tablet Oral Daily  . mupirocin ointment  1 application Nasal BID  . oxyCODONE  20 mg Oral Q12H  . pantoprazole  40 mg Oral BID AC  . traZODone  50 mg Oral Q2000   Continuous Infusions: . 0.9 % NaCl with KCl 20 mEq / L 60 mL/hr at 04/15/17 1618  . ciprofloxacin Stopped (04/16/17 0224)  . metronidazole Stopped (04/16/17 0713)  . vancomycin Stopped (04/16/17 0713)     LOS: 5 days   25 min  Verneita Griffes, MD [facility] [phone]

## 2017-04-17 ENCOUNTER — Encounter (INDEPENDENT_AMBULATORY_CARE_PROVIDER_SITE_OTHER): Payer: Self-pay | Admitting: Internal Medicine

## 2017-04-17 ENCOUNTER — Ambulatory Visit (INDEPENDENT_AMBULATORY_CARE_PROVIDER_SITE_OTHER): Payer: Medicare Other | Admitting: Internal Medicine

## 2017-04-17 LAB — COMPREHENSIVE METABOLIC PANEL
ALK PHOS: 51 U/L (ref 38–126)
ALT: 7 U/L — AB (ref 14–54)
AST: 18 U/L (ref 15–41)
Albumin: 2.9 g/dL — ABNORMAL LOW (ref 3.5–5.0)
Anion gap: 6 (ref 5–15)
BILIRUBIN TOTAL: 0.5 mg/dL (ref 0.3–1.2)
BUN: 5 mg/dL — AB (ref 6–20)
CALCIUM: 9.4 mg/dL (ref 8.9–10.3)
CO2: 27 mmol/L (ref 22–32)
CREATININE: 0.66 mg/dL (ref 0.44–1.00)
Chloride: 107 mmol/L (ref 101–111)
Glucose, Bld: 129 mg/dL — ABNORMAL HIGH (ref 65–99)
Potassium: 3.8 mmol/L (ref 3.5–5.1)
Sodium: 140 mmol/L (ref 135–145)
Total Protein: 6.5 g/dL (ref 6.5–8.1)

## 2017-04-17 LAB — CBC WITH DIFFERENTIAL/PLATELET
BASOS ABS: 0 10*3/uL (ref 0.0–0.1)
Basophils Relative: 0 %
EOS PCT: 8 %
Eosinophils Absolute: 1.2 10*3/uL — ABNORMAL HIGH (ref 0.0–0.7)
HEMATOCRIT: 36.2 % (ref 36.0–46.0)
Hemoglobin: 11.9 g/dL — ABNORMAL LOW (ref 12.0–15.0)
LYMPHS ABS: 1.8 10*3/uL (ref 0.7–4.0)
LYMPHS PCT: 13 %
MCH: 30.9 pg (ref 26.0–34.0)
MCHC: 32.9 g/dL (ref 30.0–36.0)
MCV: 94 fL (ref 78.0–100.0)
MONO ABS: 1.3 10*3/uL — AB (ref 0.1–1.0)
MONOS PCT: 9 %
NEUTROS ABS: 10 10*3/uL — AB (ref 1.7–7.7)
Neutrophils Relative %: 70 %
Platelets: 226 10*3/uL (ref 150–400)
RBC: 3.85 MIL/uL — ABNORMAL LOW (ref 3.87–5.11)
RDW: 14.8 % (ref 11.5–15.5)
WBC: 14.3 10*3/uL — ABNORMAL HIGH (ref 4.0–10.5)

## 2017-04-17 LAB — GLUCOSE, CAPILLARY: Glucose-Capillary: 131 mg/dL — ABNORMAL HIGH (ref 65–99)

## 2017-04-17 LAB — VANCOMYCIN, TROUGH: VANCOMYCIN TR: 19 ug/mL (ref 15–20)

## 2017-04-17 MED ORDER — ORAL CARE MOUTH RINSE
15.0000 mL | Freq: Two times a day (BID) | OROMUCOSAL | Status: DC
  Administered 2017-04-17 – 2017-04-24 (×14): 15 mL via OROMUCOSAL

## 2017-04-17 NOTE — Progress Notes (Signed)
Oral care given to patient. Son at bedside. Stable condition.

## 2017-04-17 NOTE — Progress Notes (Signed)
Pt is having moments of confusion and grabbing at IV line, Foley and PICC.  Bilateral Mits remain in place.  Removed with skin inspection. Skin intact.  Capillary refill less than 3 seconds.  No edema noted.  Mits replaced.  In stable condition. No c/o

## 2017-04-17 NOTE — Progress Notes (Signed)
PROGRESS NOTE  Kathleen Valenzuela ZWC:[id number] DOB: 08-30-1951 DOA: 04/10/2017 PCP: Patient, No Pcp Per  Brief Narrative: 66 year old woman with multiple medical problems, recently treated at [facility] for acute diverticulitis with abscess, status post percutaneous drainage and discharged on ciprofloxacin and Flagyl 7/31. Presented 8/7 with vomiting and diarrhea. Admitted for persistent vomiting and diarrhea. On admission CT showed improvement in abscess. She was continued on IV antibiotics. Vomiting and diarrhea resolved. She developed UTI which was treated. She developed bacteremia which was initially thought to be MRSA but was subsequently found to be coag negative staph consistent with contamination.  Assessment/Plan Acute diverticulitis with abscess -Asymptomatic, afebrile. Persistent leukocytosis without significant change over the last 24 hours. CT suggested slightly larger abscess than prior. -Continue empiric antibiotics. Consider surgical opinion versus percutaneous drainage 8/14.  Coag negative Staph bacteremia.  -Contaminant. Repeat blood cultures no growth. 2-D echocardiogram is unremarkable. -No evidence in culture of MRSA. Discontinue vancomycin  Nausea, vomiting, diarrhea -Apparently resolved. C. difficile PCR negative  Hypokalemia -Resolved.  COPD -Stable.  Bipolar disorder -Continue lithium, Xanax, trazodone  Obesity unspecified  Aortic atherosclerosis. -Follow-up as an outpatient.   DVT prophylaxis: SCDs Code Status: full Family Communication: none Disposition Plan: return to [place] , home health PT  Murray Hodgkins, MD  [facility] Direct contact: [phone] --Via amion app OR  --[email or web address]; password [email or web address]  7PM-7AM contact night coverage as above 04/17/2017, 6:21 PM  LOS: 6 days   Consultants:    Procedures:    Antimicrobials:  Ciprofloxacin 8/7 >>  Flagyl 8/7 >>  Interval history/Subjective:  Feels okay. No pain. No  complaints.  Objective: Vitals:  Afebrile, maximum temperature 100.2, 20, 99, 133/89, 96% on room air  Exam:     Constitutional:  Appears calm, comfortable  Respiratory. Clear to auscultation bilaterally. No wheezes, rales or rhonchi. Normal respiratory effort.  Cardiovascular. Regular rate and rhythm. No murmur, rub or gallop. No lower extremity edema.  Abdomen obese. Soft, nontender, nondistended.   I have personally reviewed the following:   Labs:   Complete metabolic panel unremarkable.  WBC without significant change, 14.3. Hemoglobin stable 11.9. Platelets within normal limits.  Imaging studies:   CT with persistent sigmoid diverticulitis with small diverticular abscess, slightly larger than prior study  Medical tests:     Test discussed with performing physician:    Decision to obtain old records:    Review and summation of old records:    Scheduled Meds: . ALPRAZolam  1 mg Oral TID  . clotrimazole  1 Applicatorful Vaginal QHS  . dicyclomine  10 mg Oral TID AC & HS  . donepezil  10 mg Oral Q2000  . feeding supplement (ENSURE ENLIVE)  237 mL Oral TID BM  . isosorbide mononitrate  15 mg Oral Q breakfast  . lithium carbonate  300 mg Oral Q24H  . lithium carbonate  450 mg Oral Q breakfast  . mouth rinse  15 mL Mouth Rinse BID  . metoprolol succinate  25 mg Oral Q breakfast  . multivitamin with minerals  1 tablet Oral Daily  . oxyCODONE  20 mg Oral Q12H  . pantoprazole  40 mg Oral BID AC  . traZODone  50 mg Oral Q2000   Continuous Infusions: . ciprofloxacin Stopped (04/17/17 1609)  . metronidazole Stopped (04/17/17 1609)    Principal Problem:   Diverticulitis of large intestine with abscess Active Problems:   Bipolar disorder (HCC)   COPD (chronic obstructive pulmonary disease) (HCC)   Vaginal candidiasis   LOS:  6 days

## 2017-04-17 NOTE — Progress Notes (Signed)
Pharmacy Antibiotic Note  Kathleen Valenzuela is a 66 y.o. female admitted on 04/10/2017 with Fever.  Pharmacy has been consulted for Vancomycin dosing.  Trough level is on target.   Plan: Continue Vancomycin 1000mg  IV q12hrs Monitor labs, progress, c/s  Height: 5\' 2"  (157.5 cm) Weight: 189 lb 3.2 oz (85.8 kg) IBW/kg (Calculated) : 50.1  Temp (24hrs), Avg:98.7 F (37.1 C), Min:98.3 F (36.8 C), Max:99.2 F (37.3 C)   Recent Labs Lab 04/13/17 0652 04/13/17 1137 04/14/17 0731 04/15/17 0652 04/16/17 0657 04/17/17 0515 04/17/17 0516  WBC 17.6* 17.0* 15.4* 15.4* 14.7* 14.3*  --   CREATININE 0.87  --  0.89 0.74 0.70 0.66  --   VANCOTROUGH  --   --   --   --   --   --  19    Estimated Creatinine Clearance: 71.3 mL/min (by C-G formula based on SCr of 0.66 mg/dL).    Allergies  Allergen Reactions  . Penicillins Rash    Has patient had a PCN reaction causing immediate rash, facial/tongue/throat swelling, SOB or lightheadedness with hypotension: Yes Has patient had a PCN reaction causing severe rash involving mucus membranes or skin necrosis: No Has patient had a PCN reaction that required hospitalization: No Has patient had a PCN reaction occurring within the last 10 years: No If all of the above answers are "NO", then may proceed with Cephalosporin use.    Antimicrobials this admission: Vancomycin 8/10 >>  Cipro and Flagyl 8/9 >>   Dose adjustments this admission:  Microbiology results:  BCx: GPC  UCx:  No growth  MRSA PCR: positive  Thank you for allowing pharmacy to be a part of this patient's care.  Hart Robinsons A 04/17/2017 9:59 AM

## 2017-04-18 LAB — CULTURE, BLOOD (ROUTINE X 2)
CULTURE: NO GROWTH
SPECIAL REQUESTS: ADEQUATE

## 2017-04-18 LAB — CBC
HEMATOCRIT: 36.9 % (ref 36.0–46.0)
HEMOGLOBIN: 12.2 g/dL (ref 12.0–15.0)
MCH: 30.7 pg (ref 26.0–34.0)
MCHC: 33.1 g/dL (ref 30.0–36.0)
MCV: 92.7 fL (ref 78.0–100.0)
Platelets: 214 10*3/uL (ref 150–400)
RBC: 3.98 MIL/uL (ref 3.87–5.11)
RDW: 14.7 % (ref 11.5–15.5)
WBC: 16.3 10*3/uL — ABNORMAL HIGH (ref 4.0–10.5)

## 2017-04-18 LAB — BLOOD GAS, ARTERIAL
ACID-BASE EXCESS: 1.3 mmol/L (ref 0.0–2.0)
Bicarbonate: 25.6 mmol/L (ref 20.0–28.0)
DRAWN BY: 234301
FIO2: 21
O2 Saturation: 94.2 %
PCO2 ART: 37.6 mmHg (ref 32.0–48.0)
PH ART: 7.439 (ref 7.350–7.450)
Patient temperature: 37
pO2, Arterial: 69.8 mmHg — ABNORMAL LOW (ref 83.0–108.0)

## 2017-04-18 MED ORDER — NALOXONE HCL 0.4 MG/ML IJ SOLN
0.4000 mg | INTRAMUSCULAR | Status: DC | PRN
Start: 2017-04-18 — End: 2017-04-24

## 2017-04-18 MED ORDER — MORPHINE SULFATE (PF) 2 MG/ML IV SOLN
1.0000 mg | INTRAVENOUS | Status: DC | PRN
  Administered 2017-04-18 – 2017-04-21 (×9): 1 mg via INTRAVENOUS
  Filled 2017-04-18 (×9): qty 1

## 2017-04-18 NOTE — Progress Notes (Signed)
PROGRESS NOTE    ZEHRA RUCCI  SAY:[id number] DOB: Mar 26, 1951 DOA: 04/10/2017 PCP: Patient, No Pcp Per    Brief Narrative:   66 y.o.  bipolar disorder,  early dementia  COPD,  HTN and with a recent 2 week hospitalization at [facility] for acute diverticulitis with absess s/p percutaneous drainage of the absess.    discharged from there on 04/04/17 on cipro and flagyl orally.   Patient had percutaneous drain placed but for some unclear reason this was taken out for the patient went home [place] home she was eating okay but not back to normal eating.  -diarrhea. - pain.  After about 3 days of being at SNF, she started having  diarrhea and vomiting.  She had not been able to keep anything down for days due to nausea all the time. She denied fever and chills. She denied abdominal pain.  In the ED, she was afebrile and hemodynamically stable. Her lab data were significant for a serum potassium of 2.2, white blood cell count of 20.6, and CT scan of the abdomen/pelvis which revealed a smaller intramural diverticular abscess. She was admitted for further evaluation and management.  Assessment & Plan:   Principal Problem:   Diverticulitis of large intestine with abscess Active Problems:   Bipolar disorder ([place])   COPD (chronic obstructive pulmonary disease) (HCC)   Vaginal candidiasis  Acute metabolic encephalopathy secondary to abscess as well as chronic pain and bipolar medications OxyContin, oxycodone, lithium, Xanax, trazodone -all held 8/14 Obtain blood gas If not awakening, would give Narcan 1 and monitor--- nursing reports that overnight 8/13 patient was pretty sleepy and hence night dosing Of Anxiolytic Might need stepdown monitoring  Severe hypokalemia-resolved  ID On admission, she was afebrile, but her white blood cell count was 20.6. MRSA bacteremia diagnosed 8/9 secondary to low-grade fever Blood cultures 1 on 8/9 became positive for MRSA.  PICC line ordered.  blood cultures  ordered for 8/11 are contaminant Vancomycin discontinued on 8/13 UTI? -Urinalysis was positive for infection. -Urine culture sent after vancomycin was started. Diverticulitis with abscess- narrow off of ciprofloxacin and Flagyl [duration 8/7--->??]  RX [facility] for diverticulitis with abscess. She was treated with Cipro and Flagyl and subsequently discharged without percutaneous drain NF on oral Cipro/Flagyl.  CT scan=smaller intramural diverticular abscess estimated to by 1.6 cm versus 3.6 x 3 cm since the prior exam.  Repeat CT scan ordered 8/12 showed slight increase in the size  Discussed with general surgery, and IR radiology  Will evaluate mentation prior to procedure --might require further drain placement versus open cholectomy   Nausea/vomiting/diarrhea. Prior history of hiatal hernia 01/2017 on endoscopy [2 nonbleeding ulcers noted then] Esophageal stenosis status post dilatation at that time Patient was started on IV fluids for hydration, as needed antiemetics, and as needed analgesics. Protonix was continued. -C. difficile PCR was ordered and it was negative. -Her symptoms subsided and then resolved. -Her diet was advanced from clear liquids to a soft diet secondary to poor dentition which she appears to be tolerating. Poor appetite.  Essential hypertension with query CAD/CHF history. PTA meds = Toprol-XL, isosorbide mononitrate, Lasix, and Lipitor.  -Toprol-XL was decreased to 25 mg daily and Imdur was decreased to 15 mg daily. -IV saline now on hold  Vaginal candidiasis-resolved completed a 5 day course.  Mild hyperglycemia, HbA1c 5.2. Patient has no history of diabetes. Her venous glucose is modestly elevated  And would not cover at this juncture  Left arm phlebitis-resolved  Bipolar disorder. Chronic low back pain on prior to admission opiates The patient is treated chronically with lithium, Xanax, and trazodone.  All of her oral anxiolytics as well as  pain meds have been completely discontinued on 8/14   DVT prophylaxis: SCDs>>>Lovenox Code Status: Full code Family Communication: called son-left VM 8/12. Disposition Plan: Discharge to ALF 3-4 days   Consultants:   None  Procedures:   None  Antimicrobials:   Vancomycin 8/9>>  Cipro 8/7>>  Flagyl 8/7>>   Subjective:  Poorly arousable Unable to keep her eyes open Has not been given much meds overnight  Objective: Vitals:   04/17/17 0539 04/17/17 1423 04/17/17 2200 04/18/17 0500  BP:  133/89 (!) 145/82 (!) 149/80  Pulse: 98 99 93 92  Resp:  20 20 18   Temp:  100.2 F (37.9 C) 98.6 F (37 C) 98.5 F (36.9 C)  TempSrc:  Oral Oral Oral  SpO2: 97% 96% 97% 91%  Weight:      Height:        Intake/Output Summary (Last 24 hours) at 04/18/17 0945 Last data filed at 04/17/17 1423  Gross per 24 hour  Intake                0 ml  Output               50 ml  Net              -50 ml   Filed Weights   04/10/17 1922 04/11/17 0242  Weight: 86.2 kg (190 lb) 85.8 kg (189 lb 3.2 oz)    Examination:  Edentulous snoring respirations Barely arousable Abdomen is tender in the middle quadrant and she opens her eyes when abdomen is pressed as if in pain Chest is clinically clear Rest of exam is equivocal as patient does not respond appropriately to cooperate with exam  Data Reviewed: I have personally reviewed following labs and imaging studies  CBC:  Recent Labs Lab 04/12/17 0553  04/14/17 0731 04/15/17 0652 04/16/17 0657 04/17/17 0515 04/18/17 0520  WBC 13.4*  < > 15.4* 15.4* 14.7* 14.3* 16.3*  NEUTROABS 8.8*  --   --   --   --  10.0*  --   HGB 12.8  < > 11.7* 12.3 11.2* 11.9* 12.2  HCT 39.1  < > 36.2 38.2 34.1* 36.2 36.9  MCV 93.3  < > 93.8 95.7 94.5 94.0 92.7  PLT 278  < > 198 173 190 226 214  < > = values in this interval not displayed. Basic Metabolic Panel:  Recent Labs Lab 04/12/17 0553 04/13/17 0652 04/14/17 0731 04/15/17 0652 04/16/17 0657  04/17/17 0515  NA 137 136 134* 139 140 140  K 4.9 4.2 3.8 4.0 3.6 3.8  CL 110 107 105 107 109 107  CO2 22 23 24 25 27 27   GLUCOSE 105* 140* 142* 107* 149* 129*  BUN 6 6 7  5* <5* 5*  CREATININE 0.84 0.87 0.89 0.74 0.70 0.66  CALCIUM 9.8 10.3 9.4 9.4 9.4 9.4  MG 2.0 1.7  --   --   --   --    GFR: Estimated Creatinine Clearance: 71.3 mL/min (by C-G formula based on SCr of 0.66 mg/dL). Liver Function Tests:  Recent Labs Lab 04/17/17 0515  AST 18  ALT 7*  ALKPHOS 51  BILITOT 0.5  PROT 6.5  ALBUMIN 2.9*   No results for input(s): LIPASE, AMYLASE in the last 168 hours. No results for input(s): AMMONIA in  the last 168 hours. Coagulation Profile: No results for input(s): INR, PROTIME in the last 168 hours. Cardiac Enzymes: No results for input(s): CKTOTAL, CKMB, CKMBINDEX, TROPONINI in the last 168 hours. BNP (last 3 results) No results for input(s): PROBNP in the last 8760 hours. HbA1C: No results for input(s): HGBA1C in the last 72 hours. CBG:  Recent Labs Lab 04/17/17 2103  GLUCAP 131*   Lipid Profile: No results for input(s): CHOL, HDL, LDLCALC, TRIG, CHOLHDL, LDLDIRECT in the last 72 hours. Thyroid Function Tests: No results for input(s): TSH, T4TOTAL, FREET4, T3FREE, THYROIDAB in the last 72 hours. Anemia Panel: No results for input(s): VITAMINB12, FOLATE, FERRITIN, TIBC, IRON, RETICCTPCT in the last 72 hours. Sepsis Labs: No results for input(s): PROCALCITON, LATICACIDVEN in the last 168 hours.   Radiology Studies: Ct Abdomen Pelvis W Contrast  Result Date: 04/16/2017 CLINICAL DATA:  66 year old female with history of prior acute diverticulitis and diverticular abscess. Follow-up to evaluate for resolution of abscess. EXAM: CT ABDOMEN AND PELVIS WITH CONTRAST TECHNIQUE: Multidetector CT imaging of the abdomen and pelvis was performed using the standard protocol following bolus administration of intravenous contrast. CONTRAST:  158mL ISOVUE-300 IOPAMIDOL  (ISOVUE-300) INJECTION 61% COMPARISON:  CT the abdomen and pelvis 04/10/2017. FINDINGS: Lower chest: Scarring in the right lower lobe. Calcifications of the aortic valve and mitral annulus. Mild cardiomegaly. Atherosclerotic calcifications of the right coronary artery. Hepatobiliary: No cystic or solid hepatic lesions. No intra or extrahepatic biliary ductal dilatation. Gallbladder is normal in appearance. Pancreas: No pancreatic mass. No pancreatic ductal dilatation. No pancreatic or peripancreatic fluid or inflammatory changes. Spleen: Unremarkable. Adrenals/Urinary Tract: 1.4 cm simple cyst in the lower pole of the left kidney. Other subcentimeter low-attenuation lesions in both kidneys are too small to definitively characterize, but similar to prior studies, favored to represent tiny cysts. 1 cm nodule in the lateral limb of the left adrenal gland is unchanged and incompletely characterized on today's examination but similar to prior studies dating back to 11/12/2007, presumably a small adenoma. Right adrenal gland is normal in appearance. No hydroureteronephrosis. Urinary bladder is normal in appearance. Stomach/Bowel: The appearance of the stomach is normal. There is no pathologic dilatation of small bowel or colon. Numerous colonic diverticulae are again noted, particularly in the region of the sigmoid colon. When compared to the prior examination, the inflammatory changes adjacent to the mid sigmoid colon are very similar, compatible with residual diverticulitis. This is centered around a small diverticular abscess which is slightly larger than the prior study, currently measuring 2.6 x 2.0 x 2.5 cm (axial image 68 of series 2 and coronal image 70 of series 5). The appendix is not confidently identified and may be surgically absent. Regardless, there are no inflammatory changes noted adjacent to the cecum to suggest the presence of an acute appendicitis at this time. Vascular/Lymphatic: Aortic  atherosclerosis, without evidence of aneurysm or dissection in the abdominal or pelvic vasculature. No lymphadenopathy noted in the abdomen or pelvis. Reproductive: Uterus and ovaries are unremarkable in appearance. Other: No significant volume of ascites.  No pneumoperitoneum. Musculoskeletal: There are no aggressive appearing lytic or blastic lesions noted in the visualized portions of the skeleton. IMPRESSION: 1. Persistent sigmoid diverticulitis with small diverticular abscess in the mid sigmoid colon which is slightly larger than the prior study, as above. 2. Aortic atherosclerosis, in addition to at least right coronary artery disease. Please note that although the presence of coronary artery calcium documents the presence of coronary artery disease, the severity of this  disease and any potential stenosis cannot be assessed on this non-gated CT examination. Assessment for potential risk factor modification, dietary therapy or pharmacologic therapy may be warranted, if clinically indicated. 3. Mild cardiomegaly. 4. There are calcifications of the aortic valve and mitral annulus. Echocardiographic correlation for evaluation of potential valvular dysfunction may be warranted if clinically indicated. 5. Additional incidental findings, as above. Aortic Atherosclerosis (ICD10-I70.0). Electronically Signed   By: Vinnie Langton M.D.   On: 04/16/2017 18:07    Scheduled Meds: . ALPRAZolam  1 mg Oral TID  . clotrimazole  1 Applicatorful Vaginal QHS  . dicyclomine  10 mg Oral TID AC & HS  . donepezil  10 mg Oral Q2000  . feeding supplement (ENSURE ENLIVE)  237 mL Oral TID BM  . isosorbide mononitrate  15 mg Oral Q breakfast  . lithium carbonate  300 mg Oral Q24H  . lithium carbonate  450 mg Oral Q breakfast  . mouth rinse  15 mL Mouth Rinse BID  . metoprolol succinate  25 mg Oral Q breakfast  . multivitamin with minerals  1 tablet Oral Daily  . oxyCODONE  20 mg Oral Q12H  . pantoprazole  40 mg Oral BID  AC  . traZODone  50 mg Oral Q2000   Continuous Infusions: . ciprofloxacin Stopped (04/18/17 0303)  . metronidazole Stopped (04/18/17 3709)     LOS: 7 days   35 min  Verneita Griffes, MD [facility] [phone]

## 2017-04-18 NOTE — Care Management Note (Signed)
Case Management Note  Patient Details  Name: Kathleen Valenzuela MRN: [id number] Date of Birth: 11/10/1950  If discussed at Long Length of Stay Meetings, dates discussed:  04/18/2017   Sherald Barge, RN 04/18/2017, 1:44 PM

## 2017-04-19 ENCOUNTER — Inpatient Hospital Stay (HOSPITAL_COMMUNITY): Payer: Medicare Other

## 2017-04-19 DIAGNOSIS — J9601 Acute respiratory failure with hypoxia: Secondary | ICD-10-CM | POA: Diagnosis present

## 2017-04-19 DIAGNOSIS — J811 Chronic pulmonary edema: Secondary | ICD-10-CM | POA: Diagnosis present

## 2017-04-19 DIAGNOSIS — J810 Acute pulmonary edema: Secondary | ICD-10-CM

## 2017-04-19 LAB — BLOOD GAS, ARTERIAL
ACID-BASE DEFICIT: 1.5 mmol/L (ref 0.0–2.0)
BICARBONATE: 23.7 mmol/L (ref 20.0–28.0)
Drawn by: 234301
FIO2: 21
O2 Saturation: 91.8 %
PCO2 ART: 30.8 mmHg — AB (ref 32.0–48.0)
PH ART: 7.463 — AB (ref 7.350–7.450)
Patient temperature: 37
pO2, Arterial: 62 mmHg — ABNORMAL LOW (ref 83.0–108.0)

## 2017-04-19 LAB — URINALYSIS, COMPLETE (UACMP) WITH MICROSCOPIC
BILIRUBIN URINE: NEGATIVE
Bacteria, UA: NONE SEEN
Glucose, UA: NEGATIVE mg/dL
HGB URINE DIPSTICK: NEGATIVE
Ketones, ur: NEGATIVE mg/dL
Leukocytes, UA: NEGATIVE
NITRITE: NEGATIVE
PH: 8 (ref 5.0–8.0)
Protein, ur: NEGATIVE mg/dL
SPECIFIC GRAVITY, URINE: 1.011 (ref 1.005–1.030)
Squamous Epithelial / LPF: NONE SEEN

## 2017-04-19 LAB — BASIC METABOLIC PANEL
ANION GAP: 11 (ref 5–15)
BUN: 11 mg/dL (ref 6–20)
CALCIUM: 9.9 mg/dL (ref 8.9–10.3)
CO2: 22 mmol/L (ref 22–32)
CREATININE: 0.75 mg/dL (ref 0.44–1.00)
Chloride: 108 mmol/L (ref 101–111)
Glucose, Bld: 142 mg/dL — ABNORMAL HIGH (ref 65–99)
Potassium: 3.6 mmol/L (ref 3.5–5.1)
Sodium: 141 mmol/L (ref 135–145)

## 2017-04-19 MED ORDER — FUROSEMIDE 10 MG/ML IJ SOLN
40.0000 mg | Freq: Once | INTRAMUSCULAR | Status: AC
  Administered 2017-04-19: 40 mg via INTRAVENOUS
  Filled 2017-04-19: qty 4

## 2017-04-19 MED ORDER — SODIUM CHLORIDE 0.9 % IV SOLN
500.0000 mg | Freq: Three times a day (TID) | INTRAVENOUS | Status: DC
  Administered 2017-04-19 – 2017-04-20 (×3): 500 mg via INTRAVENOUS
  Filled 2017-04-19 (×10): qty 0.5

## 2017-04-19 NOTE — Progress Notes (Signed)
PROGRESS NOTE  Kathleen Valenzuela NGE:[id number] DOB: 1950-11-07 DOA: 04/10/2017 PCP: Patient, No Pcp Per  Brief History:  66 year old female with a history of bipolar disorder, COPD, hypertension, hyperlipidemia, early onset Alzheimer's dementia presented with 4 days of intractable nausea and vomiting. The patient was recently discharged from [facility] on 04/04/17 after treatment for acute diverticulitis with abscess. Apparently the patient had a percutaneous chain placed during that two-week hospitalization. The patient's drain was discontinued prior to her discharge on 04/04/2017. The patient was discharged with ciprofloxacin and metronidazole. After only approximately 3 days home, the patient began having nausea and vomiting. At the time of admission, CT of the abdomen and pelvis revealed a 2.0 x 1.6 diverticular abscess in the sigmoid colon area. The patient was started on ciprofloxacin and metronidazole. The patient's hospitalization has been compensated by acute encephalopathy as well as persistent leukocytosis and low-grade fevers. Repeat CT of the abdomen and pelvis on 04/16/2017 showed persistent sigmoid diverticulitis with diverticular abscess slightly larger than the previous CT on 04/11/2017.  Assessment/Plan: Acute diverticulitis with abscess -Consult surgery--case discussed with Dr. Aviva Signs -discussed with IR team--please re-eval for percutaneous drainage -long discussion with pt's son--he wants full scope of care -d/c cipro and flagyl -start merrem due to persistent fever and elevated WBC -am CBC  Intractable vomiting -may be related to metronidazole -Recheck UA and urine culture -As discussed above, discontinue Cipro and Flagyl  Acute metabolic encephalopathy -Multifactorial including infectious process, opioids, and hypnotic medications -Patient remains somnolent but more awake since discontinuation of hypnotic medications and opioids -check ammonia -check  TSH -check B12 -repeat UA/urine culture -received narcan overnight 8/13-8/14  Acute respiratory failure with hypoxia -8/15--personally reviewed CXR--pulmonary edema -ABG--7.46/30/62/  On RA -supplemental oxygen -start lasix -04/15/2017 echo EF 55-60%, grade 1 DD, no WMA  Coagulase-negative Staphylococcus bacteremia -Represents a contaminant -Vancomycin discontinued  Diarrhea -Seems to be improving -C. difficile assay neg  Bipolar disorder -Lithium, trazodone, alprazolam discontinued secondary to encephalopathy  Early onset alzhemier's dementia -Aricept currently on hold -previously [facility] prior to coming home     Disposition Plan:   ALF in 3-4 days  Family Communication:   Son updated at bedside=-Total time spent 40 minutes.  Greater than 50% spent face to face counseling and coordinating care.   Consultants:  General surgery, IR  Code Status:  FULL   DVT Prophylaxis:  SCDs   Procedures: As Listed in Progress Note Above  Antibiotics: cipro 8/7>>8/8; 8/9>>>8/15 flagyl 8/7>>8/8; 8/9>>>8/15    Subjective: Patient is somnolent but arouses to voice.  She is oriented x 2.  Denies any chest pain, shortness breath, abdominal pain. She had an episode of emesis earlier today. No respiratory distress or diarrhea reported  Objective: Vitals:   04/18/17 1333 04/18/17 2100 04/18/17 2355 04/19/17 0500  BP: (!) 159/81 (!) 151/80  (!) 154/87  Pulse: 98 87  93  Resp: (!) 21 20  20   Temp: 99.1 F (37.3 C) 97.9 F (36.6 C) 99.4 F (37.4 C) (!) 100.5 F (38.1 C)  TempSrc: Oral Oral Oral Oral  SpO2: 98% 97%  95%  Weight:      Height:        Intake/Output Summary (Last 24 hours) at 04/19/17 1730 Last data filed at 04/19/17 1709  Gross per 24 hour  Intake                0 ml  Output  3200 ml  Net            -3200 ml   Weight change:  Exam:   General:  Pt is alert, follows commands appropriately, not in acute distress  HEENT: No  icterus, No thrush, No neck mass, [place]/AT  Cardiovascular: RRR, S1/S2, no rubs, no gallops  Respiratory: Bibasilar crackles. No wheezing. Good air movement  Abdomen: Soft/+BS, non tender, non distended, no guarding  Extremities: No edema, No lymphangitis, No petechiae, No rashes, no synovitis   Data Reviewed: I have personally reviewed following labs and imaging studies Basic Metabolic Panel:  Recent Labs Lab 04/13/17 0652 04/14/17 0731 04/15/17 0652 04/16/17 0657 04/17/17 0515 04/19/17 0551  NA 136 134* 139 140 140 141  K 4.2 3.8 4.0 3.6 3.8 3.6  CL 107 105 107 109 107 108  CO2 23 24 25 27 27 22   GLUCOSE 140* 142* 107* 149* 129* 142*  BUN 6 7 5* <5* 5* 11  CREATININE 0.87 0.89 0.74 0.70 0.66 0.75  CALCIUM 10.3 9.4 9.4 9.4 9.4 9.9  MG 1.7  --   --   --   --   --    Liver Function Tests:  Recent Labs Lab 04/17/17 0515  AST 18  ALT 7*  ALKPHOS 51  BILITOT 0.5  PROT 6.5  ALBUMIN 2.9*   No results for input(s): LIPASE, AMYLASE in the last 168 hours. No results for input(s): AMMONIA in the last 168 hours. Coagulation Profile: No results for input(s): INR, PROTIME in the last 168 hours. CBC:  Recent Labs Lab 04/14/17 0731 04/15/17 0652 04/16/17 0657 04/17/17 0515 04/18/17 0520  WBC 15.4* 15.4* 14.7* 14.3* 16.3*  NEUTROABS  --   --   --  10.0*  --   HGB 11.7* 12.3 11.2* 11.9* 12.2  HCT 36.2 38.2 34.1* 36.2 36.9  MCV 93.8 95.7 94.5 94.0 92.7  PLT 198 173 190 226 214   Cardiac Enzymes: No results for input(s): CKTOTAL, CKMB, CKMBINDEX, TROPONINI in the last 168 hours. BNP: Invalid input(s): POCBNP CBG:  Recent Labs Lab 04/17/17 2103  GLUCAP 131*   HbA1C: No results for input(s): HGBA1C in the last 72 hours. Urine analysis:    Component Value Date/Time   COLORURINE AMBER (A) 04/13/2017 1337   APPEARANCEUR CLOUDY (A) 04/13/2017 1337   LABSPEC 1.017 04/13/2017 1337   PHURINE 6.0 04/13/2017 1337   GLUCOSEU NEGATIVE 04/13/2017 1337   HGBUR  NEGATIVE 04/13/2017 1337   BILIRUBINUR NEGATIVE 04/13/2017 1337   KETONESUR NEGATIVE 04/13/2017 1337   PROTEINUR 30 (A) 04/13/2017 1337   NITRITE NEGATIVE 04/13/2017 1337   LEUKOCYTESUR MODERATE (A) 04/13/2017 1337   Sepsis Labs: @LABRCNTIP (procalcitonin:4,lacticidven:4) ) Recent Results (from the past 240 hour(s))  MRSA PCR Screening     Status: Abnormal   Collection Time: 04/11/17  4:20 AM  Result Value Ref Range Status   MRSA by PCR POSITIVE (A) NEGATIVE Corrected    Comment: RESULT CALLED TO, READ BACK BY AND VERIFIED WITH: [place] ON [id number] BY THOMPSON S.        The GeneXpert MRSA Assay (FDA approved for NASAL specimens only), is one component of a comprehensive MRSA colonization surveillance program. It is not intended to diagnose MRSA infection nor to guide or monitor treatment for MRSA infections. CORRECTED ON 08/08 AT 1057: PREVIOUSLY REPORTED AS RESULT CALLED TO, READ BACK BY AND VERIFIED WITH: HOWERTON M. AT [place] ON [id number] BY THOMPSON S.        The GeneXpert  MRSA Assay (FDA approved for NASAL specimens only), is one component of a  comprehensive MRSA colonization surveillance program. It is not intended to diagnose MRSA infection nor to guide or monitor treatment for MRSA infections.   Culture, blood (Routine X 2) w Reflex to ID Panel     Status: None   Collection Time: 04/11/17  9:05 AM  Result Value Ref Range Status   Specimen Description BLOOD RIGHT [place]  Final   Special Requests   Final    BOTTLES DRAWN AEROBIC AND ANAEROBIC Blood Culture results may not be optimal due to an inadequate volume of blood received in culture bottles   Culture NO GROWTH 5 DAYS  Final   Report Status 04/16/2017 FINAL  Final  Culture, blood (Routine X 2) w Reflex to ID Panel     Status: None   Collection Time: 04/11/17  9:07 AM  Result Value Ref Range Status   Specimen Description BLOOD RIGHT [place]  Final   Special Requests   Final    BOTTLES DRAWN AEROBIC AND ANAEROBIC  Blood Culture adequate volume   Culture NO GROWTH 5 DAYS  Final   Report Status 04/16/2017 FINAL  Final  C difficile quick scan w PCR reflex     Status: None   Collection Time: 04/12/17 12:59 AM  Result Value Ref Range Status   C Diff antigen NEGATIVE NEGATIVE Final   C Diff toxin NEGATIVE NEGATIVE Final   C Diff interpretation No C. difficile detected.  Final  Culture, blood (Routine X 2) w Reflex to ID Panel     Status: Abnormal   Collection Time: 04/13/17  3:19 PM  Result Value Ref Range Status   Specimen Description BLOOD BLOOD RIGHT [place]  Final   Special Requests   Final    BOTTLES DRAWN AEROBIC AND ANAEROBIC Blood Culture adequate volume   Culture  Setup Time   Final    GRAM POSITIVE COCCI IN CLUSTERS Gram Stain Report Called to,Read Back By and Verified With: WATKINS,T AT 0930 BY HUFFINES,S ON 04/14/17. AEROBIC BOTTLE ONLY Organism ID to follow CRITICAL RESULT CALLED TO, READ BACK BY AND VERIFIED WITH: S. Hall Pharm.D. 14:55 04/14/17 ([email or web address])    Culture (A)  Final    STAPHYLOCOCCUS SPECIES (COAGULASE NEGATIVE) THE SIGNIFICANCE OF ISOLATING THIS ORGANISM FROM A SINGLE SET OF BLOOD CULTURES WHEN MULTIPLE SETS ARE DRAWN IS UNCERTAIN. PLEASE NOTIFY THE MICROBIOLOGY DEPARTMENT WITHIN ONE WEEK IF SPECIATION AND SENSITIVITIES ARE REQUIRED. Performed at [facility], [place] [address], [place], [place] [address]    Report Status 04/15/2017 FINAL  Final  Blood Culture ID Panel (Reflexed)     Status: Abnormal   Collection Time: 04/13/17  3:19 PM  Result Value Ref Range Status   Enterococcus species NOT DETECTED NOT DETECTED Final   Vancomycin resistance NOT DETECTED NOT DETECTED Final   Listeria monocytogenes NOT DETECTED NOT DETECTED Final   Staphylococcus species DETECTED (A) NOT DETECTED Final    Comment: Methicillin (oxacillin) resistant coagulase negative staphylococcus. Possible blood culture contaminant (unless isolated from more than one blood culture draw or clinical case  suggests pathogenicity). No antibiotic treatment is indicated for blood  culture contaminants. CRITICAL RESULT CALLED TO, READ BACK BY AND VERIFIED WITH: S. Hall Pharm.D. 14:55 04/14/17 ([email or web address])    Staphylococcus aureus NOT DETECTED NOT DETECTED Final   Methicillin resistance DETECTED (A) NOT DETECTED Final    Comment: CRITICAL RESULT CALLED TO, READ BACK BY AND VERIFIED WITH: S. Hall Pharm.D. 14:55 04/14/17 (  wilsonm)    Streptococcus species NOT DETECTED NOT DETECTED Final   Streptococcus agalactiae NOT DETECTED NOT DETECTED Final   Streptococcus pneumoniae NOT DETECTED NOT DETECTED Final   Streptococcus pyogenes NOT DETECTED NOT DETECTED Final   Acinetobacter baumannii NOT DETECTED NOT DETECTED Final   Enterobacteriaceae species NOT DETECTED NOT DETECTED Final   Enterobacter cloacae complex NOT DETECTED NOT DETECTED Final   Escherichia coli NOT DETECTED NOT DETECTED Final   Klebsiella oxytoca NOT DETECTED NOT DETECTED Final   Klebsiella pneumoniae NOT DETECTED NOT DETECTED Final   Proteus species NOT DETECTED NOT DETECTED Final   Serratia marcescens NOT DETECTED NOT DETECTED Final   Carbapenem resistance NOT DETECTED NOT DETECTED Final   Haemophilus influenzae NOT DETECTED NOT DETECTED Final   Neisseria meningitidis NOT DETECTED NOT DETECTED Final   Pseudomonas aeruginosa NOT DETECTED NOT DETECTED Final   Candida albicans NOT DETECTED NOT DETECTED Final   Candida glabrata NOT DETECTED NOT DETECTED Final   Candida krusei NOT DETECTED NOT DETECTED Final   Candida parapsilosis NOT DETECTED NOT DETECTED Final   Candida tropicalis NOT DETECTED NOT DETECTED Final    Comment: Performed at [facility], [place] [address], [place], [place] [address]  Culture, blood (Routine X 2) w Reflex to ID Panel     Status: None   Collection Time: 04/13/17  3:29 PM  Result Value Ref Range Status   Specimen Description BLOOD BLOOD RIGHT ARM  Final   Special Requests   Final    BOTTLES DRAWN AEROBIC  AND ANAEROBIC Blood Culture adequate volume   Culture NO GROWTH 5 DAYS  Final   Report Status 04/18/2017 FINAL  Final  Culture, Urine     Status: None   Collection Time: 04/14/17  6:59 AM  Result Value Ref Range Status   Specimen Description URINE, CLEAN CATCH  Final   Special Requests NONE  Final   Culture   Final    NO GROWTH Performed at [facility], [place] [address], [place], [place] [address]    Report Status 04/16/2017 FINAL  Final  Culture, blood (Routine X 2) w Reflex to ID Panel     Status: None (Preliminary result)   Collection Time: 04/15/17  5:13 PM  Result Value Ref Range Status   Specimen Description BLOOD RIGHT [place]  Final   Special Requests   Final    Blood Culture adequate volume BOTTLES DRAWN AEROBIC ONLY   Culture NO GROWTH 4 DAYS  Final   Report Status PENDING  Incomplete  Culture, blood (Routine X 2) w Reflex to ID Panel     Status: None (Preliminary result)   Collection Time: 04/15/17  5:27 PM  Result Value Ref Range Status   Specimen Description A-LINE  Final   Special Requests   Final    BOTTLES DRAWN AEROBIC AND ANAEROBIC Blood Culture adequate volume DRAWN BY RN   Culture NO GROWTH 4 DAYS  Final   Report Status PENDING  Incomplete     Scheduled Meds: . isosorbide mononitrate  15 mg Oral Q breakfast  . mouth rinse  15 mL Mouth Rinse BID  . metoprolol succinate  25 mg Oral Q breakfast   Continuous Infusions: . ciprofloxacin 400 mg (04/19/17 1708)  . metronidazole 500 mg (04/19/17 1708)    Procedures/Studies: Dg Chest 2 View  Result Date: 04/13/2017 CLINICAL DATA:  Leukocytosis, history COPD, hypertension, recent 2 week hospitalization for acute diverticulitis with abscess post percutaneous drainage EXAM: CHEST  2  VIEW COMPARISON:  04/10/2017 FINDINGS: Upper normal heart size. Slight pulmonary vascular congestion. Atherosclerotic calcification aorta. Mild bronchitic changes with persistent elevation of RIGHT diaphragm and basilar atelectasis. No  acute infiltrate, pleural effusion or pneumothorax. Bones demineralized. IMPRESSION: Mild bronchitic changes and RIGHT basilar atelectasis. No acute infiltrate. Aortic Atherosclerosis (ICD10-I70.0). Electronically Signed   By: Lavonia Dana M.D.   On: 04/13/2017 17:38   Ct Abdomen Pelvis W Contrast  Result Date: 04/16/2017 CLINICAL DATA:  66 year old female with history of prior acute diverticulitis and diverticular abscess. Follow-up to evaluate for resolution of abscess. EXAM: CT ABDOMEN AND PELVIS WITH CONTRAST TECHNIQUE: Multidetector CT imaging of the abdomen and pelvis was performed using the standard protocol following bolus administration of intravenous contrast. CONTRAST:  110mL ISOVUE-300 IOPAMIDOL (ISOVUE-300) INJECTION 61% COMPARISON:  CT the abdomen and pelvis 04/10/2017. FINDINGS: Lower chest: Scarring in the right lower lobe. Calcifications of the aortic valve and mitral annulus. Mild cardiomegaly. Atherosclerotic calcifications of the right coronary artery. Hepatobiliary: No cystic or solid hepatic lesions. No intra or extrahepatic biliary ductal dilatation. Gallbladder is normal in appearance. Pancreas: No pancreatic mass. No pancreatic ductal dilatation. No pancreatic or peripancreatic fluid or inflammatory changes. Spleen: Unremarkable. Adrenals/Urinary Tract: 1.4 cm simple cyst in the lower pole of the left kidney. Other subcentimeter low-attenuation lesions in both kidneys are too small to definitively characterize, but similar to prior studies, favored to represent tiny cysts. 1 cm nodule in the lateral limb of the left adrenal gland is unchanged and incompletely characterized on today's examination but similar to prior studies dating back to 11/12/2007, presumably a small adenoma. Right adrenal gland is normal in appearance. No hydroureteronephrosis. Urinary bladder is normal in appearance. Stomach/Bowel: The appearance of the stomach is normal. There is no pathologic dilatation of small  bowel or colon. Numerous colonic diverticulae are again noted, particularly in the region of the sigmoid colon. When compared to the prior examination, the inflammatory changes adjacent to the mid sigmoid colon are very similar, compatible with residual diverticulitis. This is centered around a small diverticular abscess which is slightly larger than the prior study, currently measuring 2.6 x 2.0 x 2.5 cm (axial image 68 of series 2 and coronal image 70 of series 5). The appendix is not confidently identified and may be surgically absent. Regardless, there are no inflammatory changes noted adjacent to the cecum to suggest the presence of an acute appendicitis at this time. Vascular/Lymphatic: Aortic atherosclerosis, without evidence of aneurysm or dissection in the abdominal or pelvic vasculature. No lymphadenopathy noted in the abdomen or pelvis. Reproductive: Uterus and ovaries are unremarkable in appearance. Other: No significant volume of ascites.  No pneumoperitoneum. Musculoskeletal: There are no aggressive appearing lytic or blastic lesions noted in the visualized portions of the skeleton. IMPRESSION: 1. Persistent sigmoid diverticulitis with small diverticular abscess in the mid sigmoid colon which is slightly larger than the prior study, as above. 2. Aortic atherosclerosis, in addition to at least right coronary artery disease. Please note that although the presence of coronary artery calcium documents the presence of coronary artery disease, the severity of this disease and any potential stenosis cannot be assessed on this non-gated CT examination. Assessment for potential risk factor modification, dietary therapy or pharmacologic therapy may be warranted, if clinically indicated. 3. Mild cardiomegaly. 4. There are calcifications of the aortic valve and mitral annulus. Echocardiographic correlation for evaluation of potential valvular dysfunction may be warranted if clinically indicated. 5. Additional  incidental findings, as above. Aortic Atherosclerosis (ICD10-I70.0). Electronically Signed  By: Vinnie Langton M.D.   On: 04/16/2017 18:07   Ct Abdomen Pelvis W Contrast  Result Date: 04/11/2017 CLINICAL DATA:  Vomiting and diarrhea since discharge. History of recent diverticular abscess. EXAM: CT ABDOMEN AND PELVIS WITH CONTRAST TECHNIQUE: Multidetector CT imaging of the abdomen and pelvis was performed using the standard protocol following bolus administration of intravenous contrast. CONTRAST:  151mL ISOVUE-300 IOPAMIDOL (ISOVUE-300) INJECTION 61% COMPARISON:  03/29/2017 FINDINGS: Lower chest: The included heart is normal in size without pericardial effusion. There is coronary arteriosclerosis. No pericardial effusion. Lung bases demonstrate dependent atelectasis. No effusion or pneumothorax. Hepatobiliary: Mild hepatic steatosis. Calcified granuloma in the right hepatic dome. Physiologic distention of the gallbladder without wall thickening or calculi. No biliary ductal dilatation. Pancreas: Unremarkable. No pancreatic ductal dilatation or surrounding inflammatory changes. Spleen: Normal in size without focal abnormality. Adrenals/Urinary Tract: Adrenal glands demonstrate stable nodularity of the anterior limb of the left adrenal gland measuring up to 1 cm. Kidneys are normal, without renal calculi, focal lesion, or hydronephrosis. Bladder is unremarkable. Stomach/Bowel: Smaller intramural diverticular abscess along the distal sigmoid colon currently estimated at 2 x 1.6 cm versus 3.6 x 3 cm previously. No new areas of inflammation are identified. There is re- demonstration descending and sigmoid diverticulosis. No bowel obstruction. Physiologic distention of the stomach. There is normal small bowel rotation. Vascular/Lymphatic: No aortic aneurysm. There is aortoiliac and branch vessel atherosclerosis. No lymphadenopathy. Reproductive: Uterus and bilateral adnexa are unremarkable. Other: No abdominal wall  hernia or abnormality. No abdominopelvic ascites. Musculoskeletal: Degenerative disc disease L2 through S1 with vacuum disc phenomenon, disc space narrowing and discogenic sclerosis of the endplates. Associated lumbar facet arthropathy is seen. No acute osseous abnormality is noted. IMPRESSION: 1. Smaller intramural diverticular abscess currently estimated at 2 x 1.6 cm versus 3.6 x 3 cm since prior exam. No recurrence or new inflammatory process noted. 2. No bowel obstruction. 3. Mild hepatic steatosis. 4. Stable left adrenal 1 cm nodule too small further characterize. Electronically Signed   By: Ashley Royalty M.D.   On: 04/11/2017 00:41   Dg Chest Port 1 View  Result Date: 04/19/2017 CLINICAL DATA:  Chest pain and dyspnea. History of COPD, former smoker. EXAM: PORTABLE CHEST 1 VIEW COMPARISON:  PA and lateral chest x-ray of April 13, 2017 FINDINGS: There is persistent elevation of the right hemidiaphragm. The pulmonary interstitial markings are increased in the pulmonary vascularity is more engorged. The cardiac silhouette is enlarged. There is calcification in the wall of the aortic arch. The trachea is midline. The bony thorax exhibits no acute abnormality. IMPRESSION: Interval development of pulmonary vascular congestion and mild pulmonary interstitial edema consistent with CHF. Thoracic aortic atherosclerosis. Electronically Signed   By: Rekita Miotke  [place] M.D.   On: 04/19/2017 15:17   Dg Abd Acute W/chest  Result Date: 04/10/2017 CLINICAL DATA:  66 y/o  F; vomiting and diarrhea. EXAM: DG ABDOMEN ACUTE W/ 1V CHEST COMPARISON:  03/24/2017 chest radiograph FINDINGS: Low lung volumes accentuate pulmonary markings. Minor bibasilar atelectasis. No focal consolidation. Normal cardiac silhouette given projection and technique. Aortic atherosclerosis with calcification. Moderate lumbar rotatory levocurvature.  Normal bowel gas pattern. IMPRESSION: Normal bowel gas pattern. Low lung volumes with minor bibasilar  atelectasis. Aortic atherosclerosis. Electronically Signed   By: Kristine Garbe M.D.   On: 04/10/2017 21:49    Davey Bergsma, DO  [facility] Pager [phone]  If 7PM-7AM, please contact night-coverage [email or web address] Password [email or web address] 04/19/2017, 5:30 PM   LOS: 8 days

## 2017-04-20 ENCOUNTER — Ambulatory Visit (HOSPITAL_COMMUNITY)
Admit: 2017-04-20 | Discharge: 2017-04-20 | Disposition: A | Discharge status: Home / Self Care | Payer: Medicare Other | Source: Ambulatory Visit | Attending: Family Medicine | Admitting: Family Medicine

## 2017-04-20 DIAGNOSIS — K315 Obstruction of duodenum: Secondary | ICD-10-CM | POA: Insufficient documentation

## 2017-04-20 DIAGNOSIS — Z9109 Other allergy status, other than to drugs and biological substances: Secondary | ICD-10-CM | POA: Insufficient documentation

## 2017-04-20 DIAGNOSIS — I5030 Unspecified diastolic (congestive) heart failure: Secondary | ICD-10-CM

## 2017-04-20 DIAGNOSIS — I119 Hypertensive heart disease without heart failure: Secondary | ICD-10-CM | POA: Insufficient documentation

## 2017-04-20 DIAGNOSIS — I700 Atherosclerosis of aorta: Secondary | ICD-10-CM

## 2017-04-20 DIAGNOSIS — Z79899 Other long term (current) drug therapy: Secondary | ICD-10-CM | POA: Insufficient documentation

## 2017-04-20 DIAGNOSIS — E7800 Pure hypercholesterolemia, unspecified: Secondary | ICD-10-CM

## 2017-04-20 DIAGNOSIS — F319 Bipolar disorder, unspecified: Secondary | ICD-10-CM

## 2017-04-20 DIAGNOSIS — G8929 Other chronic pain: Secondary | ICD-10-CM

## 2017-04-20 DIAGNOSIS — F0280 Dementia in other diseases classified elsewhere without behavioral disturbance: Secondary | ICD-10-CM | POA: Insufficient documentation

## 2017-04-20 DIAGNOSIS — F419 Anxiety disorder, unspecified: Secondary | ICD-10-CM | POA: Insufficient documentation

## 2017-04-20 DIAGNOSIS — Z4682 Encounter for fitting and adjustment of non-vascular catheter: Secondary | ICD-10-CM | POA: Diagnosis not present

## 2017-04-20 DIAGNOSIS — G300 Alzheimer's disease with early onset: Secondary | ICD-10-CM | POA: Insufficient documentation

## 2017-04-20 DIAGNOSIS — Z87891 Personal history of nicotine dependence: Secondary | ICD-10-CM | POA: Insufficient documentation

## 2017-04-20 DIAGNOSIS — M549 Dorsalgia, unspecified: Secondary | ICD-10-CM | POA: Insufficient documentation

## 2017-04-20 DIAGNOSIS — J449 Chronic obstructive pulmonary disease, unspecified: Secondary | ICD-10-CM

## 2017-04-20 DIAGNOSIS — Z8614 Personal history of Methicillin resistant Staphylococcus aureus infection: Secondary | ICD-10-CM

## 2017-04-20 DIAGNOSIS — D649 Anemia, unspecified: Secondary | ICD-10-CM | POA: Insufficient documentation

## 2017-04-20 DIAGNOSIS — K219 Gastro-esophageal reflux disease without esophagitis: Secondary | ICD-10-CM

## 2017-04-20 DIAGNOSIS — K5780 Diverticulitis of intestine, part unspecified, with perforation and abscess without bleeding: Secondary | ICD-10-CM | POA: Diagnosis not present

## 2017-04-20 DIAGNOSIS — M1990 Unspecified osteoarthritis, unspecified site: Secondary | ICD-10-CM

## 2017-04-20 DIAGNOSIS — K5790 Diverticulosis of intestine, part unspecified, without perforation or abscess without bleeding: Secondary | ICD-10-CM

## 2017-04-20 HISTORY — DX: Unspecified diastolic (congestive) heart failure: I50.30

## 2017-04-20 LAB — CBC
HEMATOCRIT: 44.8 % (ref 36.0–46.0)
HEMOGLOBIN: 14.7 g/dL (ref 12.0–15.0)
MCH: 30.2 pg (ref 26.0–34.0)
MCHC: 32.8 g/dL (ref 30.0–36.0)
MCV: 92.2 fL (ref 78.0–100.0)
Platelets: 239 10*3/uL (ref 150–400)
RBC: 4.86 MIL/uL (ref 3.87–5.11)
RDW: 15.5 % (ref 11.5–15.5)
WBC: 23.3 10*3/uL — ABNORMAL HIGH (ref 4.0–10.5)

## 2017-04-20 LAB — BASIC METABOLIC PANEL
ANION GAP: 12 (ref 5–15)
BUN: 16 mg/dL (ref 6–20)
CO2: 24 mmol/L (ref 22–32)
Calcium: 10.2 mg/dL (ref 8.9–10.3)
Chloride: 106 mmol/L (ref 101–111)
Creatinine, Ser: 0.96 mg/dL (ref 0.44–1.00)
GFR calc Af Amer: >60 mL/min (ref >60)
GLUCOSE: 147 mg/dL — AB (ref 65–99)
POTASSIUM: 2.9 mmol/L — AB (ref 3.5–5.1)
Sodium: 142 mmol/L (ref 135–145)

## 2017-04-20 LAB — MAGNESIUM: Magnesium: 2.2 mg/dL (ref 1.7–2.4)

## 2017-04-20 LAB — CULTURE, BLOOD (ROUTINE X 2)
CULTURE: NO GROWTH
CULTURE: NO GROWTH
Special Requests: ADEQUATE

## 2017-04-20 LAB — PROTIME-INR
INR: 1.31
Prothrombin Time: 16.4 seconds — ABNORMAL HIGH (ref 11.4–15.2)

## 2017-04-20 MED ORDER — LIDOCAINE HCL (PF) 1 % IJ SOLN
INTRAMUSCULAR | Status: AC
  Filled 2017-04-20: qty 30

## 2017-04-20 MED ORDER — FUROSEMIDE 10 MG/ML IJ SOLN
40.0000 mg | Freq: Two times a day (BID) | INTRAMUSCULAR | Status: DC
  Administered 2017-04-20 – 2017-04-21 (×2): 40 mg via INTRAVENOUS
  Filled 2017-04-20 (×2): qty 4

## 2017-04-20 MED ORDER — SODIUM CHLORIDE 0.9 % IV SOLN
1.0000 g | Freq: Three times a day (TID) | INTRAVENOUS | Status: DC
  Administered 2017-04-20 – 2017-04-24 (×11): 1 g via INTRAVENOUS
  Filled 2017-04-20 (×21): qty 1

## 2017-04-20 MED ORDER — FENTANYL CITRATE (PF) 100 MCG/2ML IJ SOLN
INTRAMUSCULAR | Status: AC
  Filled 2017-04-20: qty 2

## 2017-04-20 MED ORDER — POTASSIUM CHLORIDE CRYS ER 20 MEQ PO TBCR
40.0000 meq | EXTENDED_RELEASE_TABLET | Freq: Once | ORAL | Status: AC
  Administered 2017-04-20: 40 meq via ORAL
  Filled 2017-04-20: qty 2

## 2017-04-20 MED ORDER — MIDAZOLAM HCL 2 MG/2ML IJ SOLN
INTRAMUSCULAR | Status: AC
  Filled 2017-04-20: qty 4

## 2017-04-20 MED ORDER — FUROSEMIDE 10 MG/ML IJ SOLN
40.0000 mg | Freq: Once | INTRAMUSCULAR | Status: AC
  Administered 2017-04-20: 40 mg via INTRAVENOUS

## 2017-04-20 NOTE — Procedures (Signed)
CT imaging of the lower pelvis demonstrates reduction/near resolution of diverticular abscess.  As such, attempted CT guided aspiration/drain placement was NOT performed.  Ronny Bacon, MD Pager #: [phone]

## 2017-04-20 NOTE — Consult Note (Signed)
Chief Complaint: Patient was seen in consultation today for intra abdominal abscess drain placement at the request of Samtani,Jai-Gurmukh  Referring Physician(s): Samtani,Jai-Gurmukh  Supervising Physician: Sandi Mariscal  Patient Status: [facility]  History of Present Illness: Kathleen Valenzuela is a 66 y.o. female   Intra abdominal abscess Diverticular disease Previous abscess requiring drain 03/2017 Drain was removed - ?when? Re presents with abd pain; fever; leukocytosis  CT 8/12:  IMPRESSION: 1. Persistent sigmoid diverticulitis with small diverticular abscess in the mid sigmoid colon which is slightly larger than the prior study, as above. 2. Aortic atherosclerosis, in addition to at least right coronary artery disease. Please note that although the presence of coronary artery calcium documents the presence of coronary artery disease, the severity of this disease and any potential stenosis cannot be assessed on this non-gated CT examination. Assessment for potential risk factor modification, dietary therapy or pharmacologic therapy may be warranted, if clinically indicated. 3. Mild cardiomegaly. 4. There are calcifications of the aortic valve and mitral annulus. Echocardiographic correlation for evaluation of potential valvular dysfunction may be warranted if clinically indicated. 5. Additional incidental findings, as above. Aortic Atherosclerosis (ICD10-I70.0).  Now request for drain replacement per [facility] Dr Mickeal Needy has seen pt and feels not a surgical candidate ----recommending IR drain Dr Pascal Lux has reviewed imaging and approves procedure    Past Medical History:  Diagnosis Date  . Anemia   . Anxiety   . Arthritis   . Bipolar 1 disorder ([place])   . Chronic back pain   . COPD (chronic obstructive pulmonary disease) ([place])   . Duodenal stricture   . Early onset Alzheimer's dementia   . GERD (gastroesophageal reflux disease)   . Heart murmur   . High cholesterol     . Hypertension   . MRSA bacteremia 04/14/2017  . Shortness of breath dyspnea     Past Surgical History:  Procedure Laterality Date  . BIOPSY  01/06/2017   Procedure: BIOPSY;  Surgeon: Rogene Houston, MD;  [facility];  Service: Endoscopy;;  esophageal biopsy  . CESAREAN SECTION    . COLONOSCOPY    . ESOPHAGEAL DILATION N/A 10/29/2015   Procedure: ESOPHAGEAL DILATION;  Surgeon: Rogene Houston, MD;  [facility];  Service: Endoscopy;  Laterality: N/A;  . ESOPHAGEAL DILATION N/A 01/29/2016   Procedure: ESOPHAGEAL DILATION;  Surgeon: Rogene Houston, MD;  [facility];  Service: Endoscopy;  Laterality: N/A;  . ESOPHAGEAL DILATION N/A 01/06/2017   Procedure: ESOPHAGEAL DILATION;  Surgeon: Rogene Houston, MD;  [facility];  Service: Endoscopy;  Laterality: N/A;  . ESOPHAGOGASTRODUODENOSCOPY N/A 10/29/2015   Procedure: ESOPHAGOGASTRODUODENOSCOPY (EGD);  Surgeon: Rogene Houston, MD;  [facility];  Service: Endoscopy;  Laterality: N/A;  1200  . ESOPHAGOGASTRODUODENOSCOPY (EGD) WITH PROPOFOL N/A 01/29/2016   Procedure: ESOPHAGOGASTRODUODENOSCOPY (EGD) WITH PROPOFOL;  Surgeon: Rogene Houston, MD;  [facility];  Service: Endoscopy;  Laterality: N/A;  7:30 - moved to 4/21 @11 : 25 - Ann notified pt to arrive at 10:00  . ESOPHAGOGASTRODUODENOSCOPY (EGD) WITH PROPOFOL N/A 01/06/2017   Procedure: ESOPHAGOGASTRODUODENOSCOPY (EGD) WITH PROPOFOL;  Surgeon: Rogene Houston, MD;  [facility];  Service: Endoscopy;  Laterality: N/A;  11:20  . ESOPHAGOGASTRODUODENOSCOPY (EGD) WITH PROPOFOL N/A 02/17/2017   Procedure: ESOPHAGOGASTRODUODENOSCOPY (EGD) WITH PROPOFOL;  Surgeon: Rogene Houston, MD;  [facility];  Service: Endoscopy;  Laterality: N/A;  10:30  . FOOT  SURGERY Right    bunionectomy  . HEMORRHOID SURGERY      Allergies: Penicillins  Medications: Prior to Admission medications   Medication Sig Start Date End  Date Taking? Authorizing Provider  albuterol (PROVENTIL HFA;VENTOLIN HFA) 108 (90 BASE) MCG/ACT inhaler Inhale 2 puffs into the lungs 4 (four) times daily. (0800, 1200, 1600, & 2000)    [provider]  ALPRAZolam (XANAX) 1 MG tablet Take 1 mg by mouth 3 (three) times daily. (0800, 1400, & 2000)    [provider]  atorvastatin (LIPITOR) 10 MG tablet Take 10 mg by mouth daily at 8 pm.     [provider]  calcium carbonate (TUMS - DOSED IN MG ELEMENTAL CALCIUM) 500 MG chewable tablet Chew 2 tablets by mouth every 2 (two) hours as needed for indigestion (CHEW BEFORE SWALLOWING).    [provider]  ciprofloxacin (CIPRO) 500 MG tablet Take 500 mg by mouth 2 (two) times daily.    [provider]  dicyclomine (BENTYL) 20 MG tablet Take 20 mg by mouth 4 (four) times daily. (0800, 1200, 1600, & 2000)    [provider]  docusate sodium (COLACE) 100 MG capsule Take 100 mg by mouth 2 (two) times daily. (0800 & 2000)    [provider]  donepezil (ARICEPT) 10 MG tablet Take 10 mg by mouth daily at 8 pm.    [provider]  Ferrous Gluconate 324 (37.5 Fe) MG TABS Take 324 mg by mouth 2 (two) times daily. (0800 & 2000)    [provider]  fluticasone (FLONASE) 50 MCG/ACT nasal spray Place 1 spray into both nostrils daily. (0800)    [provider]  Fluticasone Furoate-Vilanterol (BREO ELLIPTA) 200-25 MCG/INH AEPB Inhale 1 puff into the lungs daily. (0800)    [provider]  furosemide (LASIX) 40 MG tablet Take 40 mg by mouth daily. (0800)    [provider]  isosorbide mononitrate (IMDUR) 30 MG 24 hr tablet Take 30 mg by mouth daily. (0800)    [provider]  lithium 300 MG tablet Take 300 mg by mouth 2 (two) times daily. (0800 & 2000)    [provider]  lithium carbonate 150 MG capsule Take 150 mg by mouth every morning. Take 150 mg capsule along with 300 mg tablet to equal 450 mg.     [provider]  Menthol-Zinc Oxide (RISAMINE) 0.44-20.625 % OINT Apply 1 application topically 4 (four) times daily as needed (for rash/redness).    [provider]  metoprolol succinate (TOPROL-XL) 50 MG 24 hr tablet Take 50 mg by mouth daily. (0800)Take with or immediately following a meal.    [provider]  metroNIDAZOLE (FLAGYL) 500 MG tablet Take 500 mg by mouth 3 (three) times daily.    [provider]  Olopatadine HCl (PATADAY) 0.2 % SOLN Place 1 drop into both eyes daily. (0800)    [provider]  oxyCODONE (OXYCONTIN) 20 mg 12 hr tablet Take 20 mg by mouth every 12 (twelve) hours. (0800 & 2000)    [provider]  Oxycodone HCl 10 MG TABS Take 10 mg by mouth every 8 (eight) hours as needed (FOR Pain).  11/29/16   [provider]  pantoprazole (PROTONIX) 40 MG tablet Take 1 tablet (40 mg total) by mouth 2 (two) times daily before a meal. 02/17/17   Rehman, Mechele Dawley, MD  polyethylene glycol (MIRALAX / GLYCOLAX) packet Take 17 g by mouth daily as needed (FOR CONSTIPATION).  [provider]  potassium chloride (K-DUR,KLOR-CON) 10 MEQ tablet Take 10 mEq by mouth daily. (0800) 11/03/16   [provider]  tiZANidine (ZANAFLEX) 2 MG tablet Take 2 mg by mouth 2 (two) times daily. (0800 & 2000) 12/01/16   [provider]  traZODone (DESYREL) 50 MG tablet Take 50 mg by mouth daily at 8 pm.     [provider]  VOLTAREN 1 % GEL Apply 4 g topically every 12 (twelve) hours as needed. For knee pain 11/17/16   [provider]     No family history on file.  Social History   Social History  . Marital status: Divorced    Spouse name: N/A  . Number of children: N/A  . Years of education: N/A   Social History Main Topics  . Smoking status: Former Smoker    Packs/day: 2.00    Years: 35.00    Types: Cigarettes    Quit date: 07/25/2015  . Smokeless tobacco: Never Used     Comment: quit 2  weeks ago (November 2016)  . Alcohol use No  . Drug use: No  . Sexual activity: No   Other Topics Concern  . Not on file   Social History Narrative  . No narrative on file    Review of Systems: A 12 point ROS discussed and pertinent positives are indicated in the HPI above.  All other systems are negative.  Review of Systems  Constitutional: Positive for activity change, appetite change, diaphoresis, fatigue and fever.  Respiratory: Positive for shortness of breath.   Gastrointestinal: Positive for abdominal pain and constipation.  Neurological: Positive for weakness.  Psychiatric/Behavioral: Positive for confusion.    Vital Signs: BP (!) 117/100 (BP Location: Left Arm)   Pulse (!) 121   Resp (!) 40   SpO2 95%   Physical Exam  Cardiovascular: Regular rhythm.   tachycardic  Pulmonary/Chest:  tachypneic  Abdominal: Soft. Bowel sounds are normal.  Skin: Skin is warm and dry.  Psychiatric:  Consented with son at bedside  Nursing note and vitals reviewed.   Mallampati Score:  MD Evaluation Airway: WNL Heart: WNL Abdomen: WNL Chest/ Lungs: WNL ASA  Classification: 3 Mallampati/Airway Score: Two  Imaging: Dg Chest 2 View  Result Date: 04/13/2017 CLINICAL DATA:  Leukocytosis, history COPD, hypertension, recent 2 week hospitalization for acute diverticulitis with abscess post percutaneous drainage EXAM: CHEST  2 VIEW COMPARISON:  04/10/2017 FINDINGS: Upper normal heart size. Slight pulmonary vascular congestion. Atherosclerotic calcification aorta. Mild bronchitic changes with persistent elevation of RIGHT diaphragm and basilar atelectasis. No acute infiltrate, pleural effusion or pneumothorax. Bones demineralized. IMPRESSION: Mild bronchitic changes and RIGHT basilar atelectasis. No acute infiltrate. Aortic Atherosclerosis (ICD10-I70.0). Electronically Signed   By: Lavonia Dana M.D.   On: 04/13/2017 17:38   Ct Abdomen Pelvis W Contrast  Result Date: 04/16/2017 CLINICAL  DATA:  66 year old female with history of prior acute diverticulitis and diverticular abscess. Follow-up to evaluate for resolution of abscess. EXAM: CT ABDOMEN AND PELVIS WITH CONTRAST TECHNIQUE: Multidetector CT imaging of the abdomen and pelvis was performed using the standard protocol following bolus administration of intravenous contrast. CONTRAST:  149mL ISOVUE-300 IOPAMIDOL (ISOVUE-300) INJECTION 61% COMPARISON:  CT the abdomen and pelvis 04/10/2017. FINDINGS: Lower chest: Scarring in the right lower lobe. Calcifications of the aortic valve and mitral annulus. Mild cardiomegaly. Atherosclerotic calcifications of the right coronary artery. Hepatobiliary: No cystic or solid hepatic lesions. No intra or extrahepatic biliary ductal dilatation. Gallbladder is normal in appearance. Pancreas:  No pancreatic mass. No pancreatic ductal dilatation. No pancreatic or peripancreatic fluid or inflammatory changes. Spleen: Unremarkable. Adrenals/Urinary Tract: 1.4 cm simple cyst in the lower pole of the left kidney. Other subcentimeter low-attenuation lesions in both kidneys are too small to definitively characterize, but similar to prior studies, favored to represent tiny cysts. 1 cm nodule in the lateral limb of the left adrenal gland is unchanged and incompletely characterized on today's examination but similar to prior studies dating back to 11/12/2007, presumably a small adenoma. Right adrenal gland is normal in appearance. No hydroureteronephrosis. Urinary bladder is normal in appearance. Stomach/Bowel: The appearance of the stomach is normal. There is no pathologic dilatation of small bowel or colon. Numerous colonic diverticulae are again noted, particularly in the region of the sigmoid colon. When compared to the prior examination, the inflammatory changes adjacent to the mid sigmoid colon are very similar, compatible with residual diverticulitis. This is centered around a small diverticular abscess which is  slightly larger than the prior study, currently measuring 2.6 x 2.0 x 2.5 cm (axial image 68 of series 2 and coronal image 70 of series 5). The appendix is not confidently identified and may be surgically absent. Regardless, there are no inflammatory changes noted adjacent to the cecum to suggest the presence of an acute appendicitis at this time. Vascular/Lymphatic: Aortic atherosclerosis, without evidence of aneurysm or dissection in the abdominal or pelvic vasculature. No lymphadenopathy noted in the abdomen or pelvis. Reproductive: Uterus and ovaries are unremarkable in appearance. Other: No significant volume of ascites.  No pneumoperitoneum. Musculoskeletal: There are no aggressive appearing lytic or blastic lesions noted in the visualized portions of the skeleton. IMPRESSION: 1. Persistent sigmoid diverticulitis with small diverticular abscess in the mid sigmoid colon which is slightly larger than the prior study, as above. 2. Aortic atherosclerosis, in addition to at least right coronary artery disease. Please note that although the presence of coronary artery calcium documents the presence of coronary artery disease, the severity of this disease and any potential stenosis cannot be assessed on this non-gated CT examination. Assessment for potential risk factor modification, dietary therapy or pharmacologic therapy may be warranted, if clinically indicated. 3. Mild cardiomegaly. 4. There are calcifications of the aortic valve and mitral annulus. Echocardiographic correlation for evaluation of potential valvular dysfunction may be warranted if clinically indicated. 5. Additional incidental findings, as above. Aortic Atherosclerosis (ICD10-I70.0). Electronically Signed   By: Vinnie Langton M.D.   On: 04/16/2017 18:07   Ct Abdomen Pelvis W Contrast  Result Date: 04/11/2017 CLINICAL DATA:  Vomiting and diarrhea since discharge. History of recent diverticular abscess. EXAM: CT ABDOMEN AND PELVIS WITH  CONTRAST TECHNIQUE: Multidetector CT imaging of the abdomen and pelvis was performed using the standard protocol following bolus administration of intravenous contrast. CONTRAST:  179mL ISOVUE-300 IOPAMIDOL (ISOVUE-300) INJECTION 61% COMPARISON:  03/29/2017 FINDINGS: Lower chest: The included heart is normal in size without pericardial effusion. There is coronary arteriosclerosis. No pericardial effusion. Lung bases demonstrate dependent atelectasis. No effusion or pneumothorax. Hepatobiliary: Mild hepatic steatosis. Calcified granuloma in the right hepatic dome. Physiologic distention of the gallbladder without wall thickening or calculi. No biliary ductal dilatation. Pancreas: Unremarkable. No pancreatic ductal dilatation or surrounding inflammatory changes. Spleen: Normal in size without focal abnormality. Adrenals/Urinary Tract: Adrenal glands demonstrate stable nodularity of the anterior limb of the left adrenal gland measuring up to 1 cm. Kidneys are normal, without renal calculi, focal lesion, or hydronephrosis. Bladder is unremarkable. Stomach/Bowel: Smaller intramural diverticular abscess along the distal  sigmoid colon currently estimated at 2 x 1.6 cm versus 3.6 x 3 cm previously. No new areas of inflammation are identified. There is re- demonstration descending and sigmoid diverticulosis. No bowel obstruction. Physiologic distention of the stomach. There is normal small bowel rotation. Vascular/Lymphatic: No aortic aneurysm. There is aortoiliac and branch vessel atherosclerosis. No lymphadenopathy. Reproductive: Uterus and bilateral adnexa are unremarkable. Other: No abdominal wall hernia or abnormality. No abdominopelvic ascites. Musculoskeletal: Degenerative disc disease L2 through S1 with vacuum disc phenomenon, disc space narrowing and discogenic sclerosis of the endplates. Associated lumbar facet arthropathy is seen. No acute osseous abnormality is noted. IMPRESSION: 1. Smaller intramural  diverticular abscess currently estimated at 2 x 1.6 cm versus 3.6 x 3 cm since prior exam. No recurrence or new inflammatory process noted. 2. No bowel obstruction. 3. Mild hepatic steatosis. 4. Stable left adrenal 1 cm nodule too small further characterize. Electronically Signed   By: Ashley Royalty M.D.   On: 04/11/2017 00:41   Dg Chest Port 1 View  Result Date: 04/19/2017 CLINICAL DATA:  Chest pain and dyspnea. History of COPD, former smoker. EXAM: PORTABLE CHEST 1 VIEW COMPARISON:  PA and lateral chest x-ray of April 13, 2017 FINDINGS: There is persistent elevation of the right hemidiaphragm. The pulmonary interstitial markings are increased in the pulmonary vascularity is more engorged. The cardiac silhouette is enlarged. There is calcification in the wall of the aortic arch. The trachea is midline. The bony thorax exhibits no acute abnormality. IMPRESSION: Interval development of pulmonary vascular congestion and mild pulmonary interstitial edema consistent with CHF. Thoracic aortic atherosclerosis. Electronically Signed   By: David  Martinique M.D.   On: 04/19/2017 15:17   Dg Abd Acute W/chest  Result Date: 04/10/2017 CLINICAL DATA:  66 y/o  F; vomiting and diarrhea. EXAM: DG ABDOMEN ACUTE W/ 1V CHEST COMPARISON:  03/24/2017 chest radiograph FINDINGS: Low lung volumes accentuate pulmonary markings. Minor bibasilar atelectasis. No focal consolidation. Normal cardiac silhouette given projection and technique. Aortic atherosclerosis with calcification. Moderate lumbar rotatory levocurvature.  Normal bowel gas pattern. IMPRESSION: Normal bowel gas pattern. Low lung volumes with minor bibasilar atelectasis. Aortic atherosclerosis. Electronically Signed   By: Kristine Garbe M.D.   On: 04/10/2017 21:49    Labs:  CBC:  Recent Labs  04/16/17 0657 04/17/17 0515 04/18/17 0520 04/20/17 0500  WBC 14.7* 14.3* 16.3* 23.3*  HGB 11.2* 11.9* 12.2 14.7  HCT 34.1* 36.2 36.9 44.8  PLT 190 226 214 239     COAGS: No results for input(s): INR, APTT in the last 8760 hours.  BMP:  Recent Labs  04/16/17 0657 04/17/17 0515 04/19/17 0551 04/20/17 0500  NA 140 140 141 142  K 3.6 3.8 3.6 2.9*  CL 109 107 108 106  CO2 27 27 22 24   GLUCOSE 149* 129* 142* 147*  BUN <5* 5* 11 16  CALCIUM 9.4 9.4 9.9 10.2  CREATININE 0.70 0.66 0.75 0.96  GFRNONAA >60 >60 >60 >60  GFRAA >60 >60 >60 >60    LIVER FUNCTION TESTS:  Recent Labs  04/10/17 2200 04/17/17 0515  BILITOT 1.2 0.5  AST 18 18  ALT 13* 7*  ALKPHOS 70 51  PROT 7.9 6.5  ALBUMIN 4.0 2.9*    TUMOR MARKERS: No results for input(s): AFPTM, CEA, CA199, CHROMGRNA in the last 8760 hours.  Assessment and Plan:  Diverticular abscess Scheduled for drain placement Risks and benefits discussed with the patient's son including bleeding, infection, damage to adjacent structures, bowel perforation/fistula connection, and sepsis. All  of his questions were answered, he is agreeable to proceed. Consent signed and in chart.  Thank you for this interesting consult.  I greatly enjoyed meeting [employer] and look forward to participating in their care.  A copy of this report was sent to the requesting provider on this date.  Electronically Signed: Lavonia Drafts, PA-C 04/20/2017, 1:40 PM   I spent a total of 40 Minutes    in face to face in clinical consultation, greater than 50% of which was counseling/coordinating care for diverticular abscess drain

## 2017-04-20 NOTE — Consult Note (Signed)
Reason for Consult: Sigmoid diverticulitis with abscess Referring Physician: Dr. Rip Harbour is an 66 y.o. female.  HPI: Patient is a 66 year old white female with multiple medical problems who has had acute diverticulitis with abscess starting late July. History is limited as the patient suffers from Alzheimer's dementia and bipolar disorder. She was admitted to [facility] on 04/10/2017 soon after being discharged from [facility] after undergoing percutaneous drainage of intra-abdominal abscesses. She has had persistent leukocytosis and low-grade fevers. She does have chronic pain, though it is difficult to determine whether this is due to her back or her abdomen. She did receive Narcan yesterday due to mental status depression. A follow-up CAT scan was performed which revealed a slightly larger diverticular abscess. She does not have free air. There is some resolution of the inflammation noted.  Past Medical History:  Diagnosis Date  . Anemia   . Anxiety   . Arthritis   . Bipolar 1 disorder ([place])   . Chronic back pain   . COPD (chronic obstructive pulmonary disease) ([place])   . Duodenal stricture   . Early onset Alzheimer's dementia   . GERD (gastroesophageal reflux disease)   . Heart murmur   . High cholesterol   . Hypertension   . MRSA bacteremia 04/14/2017  . Shortness of breath dyspnea     Past Surgical History:  Procedure Laterality Date  . BIOPSY  01/06/2017   Procedure: BIOPSY;  Surgeon: Rogene Houston, MD;  [facility];  Service: Endoscopy;;  esophageal biopsy  . CESAREAN SECTION    . COLONOSCOPY    . ESOPHAGEAL DILATION N/A 10/29/2015   Procedure: ESOPHAGEAL DILATION;  Surgeon: Rogene Houston, MD;  [facility];  Service: Endoscopy;  Laterality: N/A;  . ESOPHAGEAL DILATION N/A 01/29/2016   Procedure: ESOPHAGEAL DILATION;  Surgeon: Rogene Houston, MD;  [facility];  Service: Endoscopy;  Laterality: N/A;  . ESOPHAGEAL  DILATION N/A 01/06/2017   Procedure: ESOPHAGEAL DILATION;  Surgeon: Rogene Houston, MD;  [facility];  Service: Endoscopy;  Laterality: N/A;  . ESOPHAGOGASTRODUODENOSCOPY N/A 10/29/2015   Procedure: ESOPHAGOGASTRODUODENOSCOPY (EGD);  Surgeon: Rogene Houston, MD;  [facility];  Service: Endoscopy;  Laterality: N/A;  1200  . ESOPHAGOGASTRODUODENOSCOPY (EGD) WITH PROPOFOL N/A 01/29/2016   Procedure: ESOPHAGOGASTRODUODENOSCOPY (EGD) WITH PROPOFOL;  Surgeon: Rogene Houston, MD;  [facility];  Service: Endoscopy;  Laterality: N/A;  7:30 - moved to 4/21 '@11'$ : 25 - Ann notified pt to arrive at 10:00  . ESOPHAGOGASTRODUODENOSCOPY (EGD) WITH PROPOFOL N/A 01/06/2017   Procedure: ESOPHAGOGASTRODUODENOSCOPY (EGD) WITH PROPOFOL;  Surgeon: Rogene Houston, MD;  [facility];  Service: Endoscopy;  Laterality: N/A;  11:20  . ESOPHAGOGASTRODUODENOSCOPY (EGD) WITH PROPOFOL N/A 02/17/2017   Procedure: ESOPHAGOGASTRODUODENOSCOPY (EGD) WITH PROPOFOL;  Surgeon: Rogene Houston, MD;  [facility];  Service: Endoscopy;  Laterality: N/A;  10:30  . FOOT SURGERY Right    bunionectomy  . HEMORRHOID SURGERY      No family history on file.  Social History:  reports that she quit smoking about 20 months ago. Her smoking use included Cigarettes. She has a 70.00 pack-year smoking history. She has never used smokeless tobacco. She reports that she does not drink alcohol or use drugs.  Allergies:  Allergies  Allergen Reactions  . Penicillins Rash    Has patient had a PCN reaction causing immediate rash, facial/tongue/throat swelling, SOB or lightheadedness with  hypotension: Yes Has patient had a PCN reaction causing severe rash involving mucus membranes or skin necrosis: No Has patient had a PCN reaction that required hospitalization: No Has patient had a PCN reaction occurring within the last 10 years: No If all of the above answers are "NO", then may proceed with  Cephalosporin use.     Medications:  Scheduled: . isosorbide mononitrate  15 mg Oral Q breakfast  . mouth rinse  15 mL Mouth Rinse BID  . metoprolol succinate  25 mg Oral Q breakfast    Results for orders placed or performed during the hospital encounter of 04/10/17 (from the past 48 hour(s))  Blood gas, arterial     Status: Abnormal   Collection Time: 04/18/17 10:20 AM  Result Value Ref Range   FIO2 21.00    Delivery systems ROOM AIR    pH, Arterial 7.439 7.350 - 7.450   pCO2 arterial 37.6 32.0 - 48.0 mmHg   pO2, Arterial 69.8 (L) 83.0 - 108.0 mmHg   Bicarbonate 25.6 20.0 - 28.0 mmol/L   Acid-Base Excess 1.3 0.0 - 2.0 mmol/L   O2 Saturation 94.2 %   Patient temperature 37.0    Collection site LEFT RADIAL    Drawn by [id number]    Allens test (pass/fail) PASS PASS  Basic metabolic panel     Status: Abnormal   Collection Time: 04/19/17  5:51 AM  Result Value Ref Range   Sodium 141 135 - 145 mmol/L   Potassium 3.6 3.5 - 5.1 mmol/L   Chloride 108 101 - 111 mmol/L   CO2 22 22 - 32 mmol/L   Glucose, Bld 142 (H) 65 - 99 mg/dL   BUN 11 6 - 20 mg/dL   Creatinine, Ser 0.75 0.44 - 1.00 mg/dL   Calcium 9.9 8.9 - 10.3 mg/dL   GFR calc non Af Amer >60 >60 mL/min   GFR calc Af Amer >60 >60 mL/min    Comment: (NOTE) The eGFR has been calculated using the CKD EPI equation. This calculation has not been validated in all clinical situations. eGFR's persistently <60 mL/min signify possible Chronic Kidney Disease.    Anion gap 11 5 - 15  Urinalysis, Complete w Microscopic     Status: None   Collection Time: 04/19/17  2:36 PM  Result Value Ref Range   Color, Urine YELLOW YELLOW   APPearance CLEAR CLEAR   Specific Gravity, Urine 1.011 1.005 - 1.030   pH 8.0 5.0 - 8.0   Glucose, UA NEGATIVE NEGATIVE mg/dL   Hgb urine dipstick NEGATIVE NEGATIVE   Bilirubin Urine NEGATIVE NEGATIVE   Ketones, ur NEGATIVE NEGATIVE mg/dL   Protein, ur NEGATIVE NEGATIVE mg/dL   Nitrite NEGATIVE NEGATIVE    Leukocytes, UA NEGATIVE NEGATIVE   RBC / HPF 0-5 0 - 5 RBC/hpf   WBC, UA 0-5 0 - 5 WBC/hpf   Bacteria, UA NONE SEEN NONE SEEN   Squamous Epithelial / LPF NONE SEEN NONE SEEN  Blood gas, arterial     Status: Abnormal   Collection Time: 04/19/17  3:20 PM  Result Value Ref Range   FIO2 21.00    Delivery systems ROOM AIR    pH, Arterial 7.463 (H) 7.350 - 7.450   pCO2 arterial 30.8 (L) 32.0 - 48.0 mmHg   pO2, Arterial 62.0 (L) 83.0 - 108.0 mmHg   Bicarbonate 23.7 20.0 - 28.0 mmol/L   Acid-base deficit 1.5 0.0 - 2.0 mmol/L   O2 Saturation 91.8 %   Patient temperature 37.0  Collection site LEFT BRACHIAL    Drawn by [id number]     Dg Chest Port 1 View  Result Date: 04/19/2017 CLINICAL DATA:  Chest pain and dyspnea. History of COPD, former smoker. EXAM: PORTABLE CHEST 1 VIEW COMPARISON:  PA and lateral chest x-ray of April 13, 2017 FINDINGS: There is persistent elevation of the right hemidiaphragm. The pulmonary interstitial markings are increased in the pulmonary vascularity is more engorged. The cardiac silhouette is enlarged. There is calcification in the wall of the aortic arch. The trachea is midline. The bony thorax exhibits no acute abnormality. IMPRESSION: Interval development of pulmonary vascular congestion and mild pulmonary interstitial edema consistent with CHF. Thoracic aortic atherosclerosis. Electronically Signed   By: David  Martinique M.D.   On: 04/19/2017 15:17    ROS:  Review of systems not obtained due to patient factors.  Blood pressure 129/79, pulse (!) 106, temperature 98.5 F (36.9 C), temperature source Oral, resp. rate (!) 26, height '5\' 2"'$  (1.575 m), weight 189 lb 3.2 oz (85.8 kg), SpO2 91 %. Physical Exam: Moaning white female who I was able to reorient at times Head is normocephalic, atraumatic Lungs clear auscultation with equal breath sounds bilaterally Heart examination reveals a regular rate and rhythm without S3, S4, murmurs Abdomen is soft without rigidity.  Could not assess hepatosplenomegaly due to body habitus. No specific tenderness is noted. No guarding was noted. She does have bowel sounds. CT scan images personally reviewed  Assessment/Plan: Impression: Persistent sigmoid diverticulitis with contained perforation Plan: Patient is undergoing percutaneous drainage by interventional radiology today. There is no need for acute surgical intervention at this time. Patient would be a poor surgery candidate given her multiple medical problems. Will follow with you.  Aviva Signs 04/20/2017, 8:10 AM

## 2017-04-20 NOTE — Progress Notes (Addendum)
PROGRESS NOTE  Kathleen Valenzuela VXY:[id number] DOB: September 04, 1951 DOA: 04/10/2017 PCP: Patient, No Pcp Per  Brief History:  66 year old female with a history of bipolar disorder, COPD, hypertension, hyperlipidemia, early onset Alzheimer's dementia presented with 4 days of intractable nausea and vomiting. The patient was recently discharged from [facility] on 04/04/17 after treatment for acute diverticulitis with abscess. Apparently the patient had a percutaneous chain placed during that two-week hospitalization. The patient's drain was discontinued prior to her discharge on 04/04/2017. The patient was discharged with ciprofloxacin and metronidazole. After only approximately 3 days home, the patient began having nausea and vomiting. At the time of admission, CT of the abdomen and pelvis revealed a 2.0 x 1.6 diverticular abscess in the sigmoid colon area. The patient was started on ciprofloxacin and metronidazole. The patient's hospitalization has been complicated by acute encephalopathy as well as persistent leukocytosis and low-grade fevers. Repeat CT of the abdomen and pelvis on 04/16/2017 showed persistent sigmoid diverticulitis with diverticular abscess slightly larger than the previous CT on 04/11/2017.  Gen. surgery and IR were consulted. The patient was sent to [facility] 04/20/2017 for percutaneous drain placement. The patient developed respiratory failure secondary to pulmonary edema and acute diastolic CHF. She was started on furosemide.   Assessment/Plan: Acute diverticulitis with abscess -Consult surgery--case discussed with Dr. Aviva Signs -discussed with IR team 8/15-->to [place] for possible drainage 8/16 -long discussion with pt's son--he wants full scope of care -d/c cipro and flagyl -start merrem due to persistent fever and elevated WBC -follow up CBC  Intractable vomiting -may be related to metronidazole -Recheck UA--no pyuria -As discussed above, discontinue Cipro  and Flagyl-->no vomiting in past 24 hours  Acute metabolic encephalopathy -Multifactorial including infectious process, opioids, and hypnotic medications -Patient remains somnolent but more awake since discontinuation of hypnotic medications and opioids -check ammonia--pending -check TSH--pending -check B12--pending -repeat UA--no pyuria -received narcan overnight 8/13-8/14 -improving since stopping opiates and hypnotics, now more alert but pleasantly confused  Acute respiratory failure with hypoxia -8/15--personally reviewed CXR--pulmonary edema -ABG--7.46/30/62/  On RA -supplemental oxygen -repeat lasix 40 mg IV -04/15/2017 echo EF 55-60%, grade 1 DD, no WMA -daily weights  Acute diastolic CHF -increase lasix to bid -daily weights -strict I/os  -04/15/2017 echo EF 55-60%, grade 1 DD, no WMA  Acute urine retention -foley place 8/15 -1300 cc out after foley place  Coagulase-negative Staphylococcus bacteremia -Represents a contaminant -Vancomycin discontinued  Diarrhea -Seems to be improving -C. difficile assay neg  Bipolar disorder -Lithium, trazodone, alprazolam discontinued secondary to encephalopathy  Early onset alzhemier's dementia -Aricept currently on hold -previously resided at [facility] prior to coming home     Disposition Plan:   ALF in 3-4 days  Family Communication:   Son updated at bedside=-Total time spent 40 minutes.  Greater than 50% spent face to face counseling and coordinating care.   Consultants:  General surgery, IR  Code Status:  FULL   DVT Prophylaxis:  SCDs   Procedures: As Listed in Progress Note Above  Antibiotics: cipro 8/7>>8/8; 8/9>>>8/15 flagyl 8/7>>8/8; 8/9>>>8/15    Subjective: Patient is more alert and awake, but pleasantly confused. She denies any fevers, chills, chest pain, vomiting, diarrhea. She complains of abdominal pain. She has some shortness of breath. She states that her breathing is  better than yesterday. No reports of emesis overnight.  Objective: Vitals:   04/19/17 0500 04/19/17 2049 04/19/17 2232 04/20/17 0500  BP: (!) 154/87  125/70 129/79  Pulse: 93  (!) 106 (!) 106  Resp: 20  20 (!) 26  Temp: (!) 100.5 F (38.1 C)  98.3 F (36.8 C) 98.5 F (36.9 C)  TempSrc: Oral  Oral Oral  SpO2: 95% 93% 94% 91%  Weight:      Height:        Intake/Output Summary (Last 24 hours) at 04/20/17 1112 Last data filed at 04/20/17 0500  Gross per 24 hour  Intake              100 ml  Output             6800 ml  Net            -6700 ml   Weight change:  Exam:   General:  Pt is alert, follows commands appropriately, not in acute distress  HEENT: No icterus, No thrush, No neck mass, [place]/AT  Cardiovascular: RRR, S1/S2, no rubs, no gallops  Respiratory: Bibasilar crackles. No wheezing. Good air movement.  Abdomen: Soft/+BS, non tender, non distended, no guarding  Extremities: trace LE edema, No lymphangitis, No petechiae, No rashes, no synovitis   Data Reviewed: I have personally reviewed following labs and imaging studies Basic Metabolic Panel:  Recent Labs Lab 04/14/17 0731 04/15/17 0652 04/16/17 0657 04/17/17 0515 04/19/17 0551  NA 134* 139 140 140 141  K 3.8 4.0 3.6 3.8 3.6  CL 105 107 109 107 108  CO2 24 25 27 27 22   GLUCOSE 142* 107* 149* 129* 142*  BUN 7 5* <5* 5* 11  CREATININE 0.89 0.74 0.70 0.66 0.75  CALCIUM 9.4 9.4 9.4 9.4 9.9   Liver Function Tests:  Recent Labs Lab 04/17/17 0515  AST 18  ALT 7*  ALKPHOS 51  BILITOT 0.5  PROT 6.5  ALBUMIN 2.9*   No results for input(s): LIPASE, AMYLASE in the last 168 hours. No results for input(s): AMMONIA in the last 168 hours. Coagulation Profile: No results for input(s): INR, PROTIME in the last 168 hours. CBC:  Recent Labs Lab 04/15/17 0652 04/16/17 0657 04/17/17 0515 04/18/17 0520 04/20/17 0500  WBC 15.4* 14.7* 14.3* 16.3* 23.3*  NEUTROABS  --   --  10.0*  --   --   HGB 12.3 11.2*  11.9* 12.2 14.7  HCT 38.2 34.1* 36.2 36.9 44.8  MCV 95.7 94.5 94.0 92.7 92.2  PLT 173 190 226 214 239   Cardiac Enzymes: No results for input(s): CKTOTAL, CKMB, CKMBINDEX, TROPONINI in the last 168 hours. BNP: Invalid input(s): POCBNP CBG:  Recent Labs Lab 04/17/17 2103  GLUCAP 131*   HbA1C: No results for input(s): HGBA1C in the last 72 hours. Urine analysis:    Component Value Date/Time   COLORURINE YELLOW 04/19/2017 [place] 04/19/2017 1436   LABSPEC 1.011 04/19/2017 1436   PHURINE 8.0 04/19/2017 1436   GLUCOSEU NEGATIVE 04/19/2017 1436   HGBUR NEGATIVE 04/19/2017 1436   BILIRUBINUR NEGATIVE 04/19/2017 1436   KETONESUR NEGATIVE 04/19/2017 1436   PROTEINUR NEGATIVE 04/19/2017 1436   NITRITE NEGATIVE 04/19/2017 1436   LEUKOCYTESUR NEGATIVE 04/19/2017 1436   Sepsis Labs: @LABRCNTIP (procalcitonin:4,lacticidven:4) ) Recent Results (from the past 240 hour(s))  MRSA PCR Screening     Status: Abnormal   Collection Time: 04/11/17  4:20 AM  Result Value Ref Range Status   MRSA by PCR POSITIVE (A) NEGATIVE Corrected    Comment: RESULT CALLED TO, READ BACK BY AND VERIFIED WITH: [place] ON [id number] BY THOMPSON S.  The GeneXpert MRSA Assay (FDA approved for NASAL specimens only), is one component of a comprehensive MRSA colonization surveillance program. It is not intended to diagnose MRSA infection nor to guide or monitor treatment for MRSA infections. CORRECTED ON 08/08 AT 1057: PREVIOUSLY REPORTED AS RESULT CALLED TO, READ BACK BY AND VERIFIED WITH: HOWERTON M. AT [place] ON [id number] BY THOMPSON S.        The GeneXpert MRSA Assay (FDA approved for NASAL specimens only), is one component of a  comprehensive MRSA colonization surveillance program. It is not intended to diagnose MRSA infection nor to guide or monitor treatment for MRSA infections.   Culture, blood (Routine X 2) w Reflex to ID Panel     Status: None   Collection Time:  04/11/17  9:05 AM  Result Value Ref Range Status   Specimen Description BLOOD RIGHT [place]  Final   Special Requests   Final    BOTTLES DRAWN AEROBIC AND ANAEROBIC Blood Culture results may not be optimal due to an inadequate volume of blood received in culture bottles   Culture NO GROWTH 5 DAYS  Final   Report Status 04/16/2017 FINAL  Final  Culture, blood (Routine X 2) w Reflex to ID Panel     Status: None   Collection Time: 04/11/17  9:07 AM  Result Value Ref Range Status   Specimen Description BLOOD RIGHT [place]  Final   Special Requests   Final    BOTTLES DRAWN AEROBIC AND ANAEROBIC Blood Culture adequate volume   Culture NO GROWTH 5 DAYS  Final   Report Status 04/16/2017 FINAL  Final  C difficile quick scan w PCR reflex     Status: None   Collection Time: 04/12/17 12:59 AM  Result Value Ref Range Status   C Diff antigen NEGATIVE NEGATIVE Final   C Diff toxin NEGATIVE NEGATIVE Final   C Diff interpretation No C. difficile detected.  Final  Culture, blood (Routine X 2) w Reflex to ID Panel     Status: Abnormal   Collection Time: 04/13/17  3:19 PM  Result Value Ref Range Status   Specimen Description BLOOD BLOOD RIGHT [place]  Final   Special Requests   Final    BOTTLES DRAWN AEROBIC AND ANAEROBIC Blood Culture adequate volume   Culture  Setup Time   Final    GRAM POSITIVE COCCI IN CLUSTERS Gram Stain Report Called to,Read Back By and Verified With: WATKINS,T AT 0930 BY HUFFINES,S ON 04/14/17. AEROBIC BOTTLE ONLY Organism ID to follow CRITICAL RESULT CALLED TO, READ BACK BY AND VERIFIED WITH: S. Hall Pharm.D. 14:55 04/14/17 ([email or web address])    Culture (A)  Final    STAPHYLOCOCCUS SPECIES (COAGULASE NEGATIVE) THE SIGNIFICANCE OF ISOLATING THIS ORGANISM FROM A SINGLE SET OF BLOOD CULTURES WHEN MULTIPLE SETS ARE DRAWN IS UNCERTAIN. PLEASE NOTIFY THE MICROBIOLOGY DEPARTMENT WITHIN ONE WEEK IF SPECIATION AND SENSITIVITIES ARE REQUIRED. Performed at [facility], [place] [address]  [place], [place] [address]    Report Status 04/15/2017 FINAL  Final  Blood Culture ID Panel (Reflexed)     Status: Abnormal   Collection Time: 04/13/17  3:19 PM  Result Value Ref Range Status   Enterococcus species NOT DETECTED NOT DETECTED Final   Vancomycin resistance NOT DETECTED NOT DETECTED Final   Listeria monocytogenes NOT DETECTED NOT DETECTED Final   Staphylococcus species DETECTED (A) NOT DETECTED Final    Comment: Methicillin (oxacillin) resistant coagulase negative staphylococcus. Possible blood culture contaminant (unless isolated from more than one  blood culture draw or clinical case suggests pathogenicity). No antibiotic treatment is indicated for blood  culture contaminants. CRITICAL RESULT CALLED TO, READ BACK BY AND VERIFIED WITH: S. Hall Pharm.D. 14:55 04/14/17 ([email or web address])    Staphylococcus aureus NOT DETECTED NOT DETECTED Final   Methicillin resistance DETECTED (A) NOT DETECTED Final    Comment: CRITICAL RESULT CALLED TO, READ BACK BY AND VERIFIED WITH: S. Hall Pharm.D. 14:55 04/14/17 ([email or web address])    Streptococcus species NOT DETECTED NOT DETECTED Final   Streptococcus agalactiae NOT DETECTED NOT DETECTED Final   Streptococcus pneumoniae NOT DETECTED NOT DETECTED Final   Streptococcus pyogenes NOT DETECTED NOT DETECTED Final   Acinetobacter baumannii NOT DETECTED NOT DETECTED Final   Enterobacteriaceae species NOT DETECTED NOT DETECTED Final   Enterobacter cloacae complex NOT DETECTED NOT DETECTED Final   Escherichia coli NOT DETECTED NOT DETECTED Final   Klebsiella oxytoca NOT DETECTED NOT DETECTED Final   Klebsiella pneumoniae NOT DETECTED NOT DETECTED Final   Proteus species NOT DETECTED NOT DETECTED Final   Serratia marcescens NOT DETECTED NOT DETECTED Final   Carbapenem resistance NOT DETECTED NOT DETECTED Final   Haemophilus influenzae NOT DETECTED NOT DETECTED Final   Neisseria meningitidis NOT DETECTED NOT DETECTED Final   Pseudomonas aeruginosa NOT DETECTED NOT  DETECTED Final   Candida albicans NOT DETECTED NOT DETECTED Final   Candida glabrata NOT DETECTED NOT DETECTED Final   Candida krusei NOT DETECTED NOT DETECTED Final   Candida parapsilosis NOT DETECTED NOT DETECTED Final   Candida tropicalis NOT DETECTED NOT DETECTED Final    Comment: Performed at [facility], [place] [address] [place], [place] [address]  Culture, blood (Routine X 2) w Reflex to ID Panel     Status: None   Collection Time: 04/13/17  3:29 PM  Result Value Ref Range Status   Specimen Description BLOOD BLOOD RIGHT ARM  Final   Special Requests   Final    BOTTLES DRAWN AEROBIC AND ANAEROBIC Blood Culture adequate volume   Culture NO GROWTH 5 DAYS  Final   Report Status 04/18/2017 FINAL  Final  Culture, Urine     Status: None   Collection Time: 04/14/17  6:59 AM  Result Value Ref Range Status   Specimen Description URINE, CLEAN CATCH  Final   Special Requests NONE  Final   Culture   Final    NO GROWTH Performed at [facility], [place] [address], [place], [place] [address]    Report Status 04/16/2017 FINAL  Final  Culture, blood (Routine X 2) w Reflex to ID Panel     Status: None   Collection Time: 04/15/17  5:13 PM  Result Value Ref Range Status   Specimen Description BLOOD RIGHT [place]  Final   Special Requests   Final    Blood Culture adequate volume BOTTLES DRAWN AEROBIC ONLY   Culture NO GROWTH 5 DAYS  Final   Report Status 04/20/2017 FINAL  Final  Culture, blood (Routine X 2) w Reflex to ID Panel     Status: None   Collection Time: 04/15/17  5:27 PM  Result Value Ref Range Status   Specimen Description A-LINE  Final   Special Requests   Final    BOTTLES DRAWN AEROBIC AND ANAEROBIC Blood Culture adequate volume DRAWN BY RN   Culture NO GROWTH 5 DAYS  Final   Report Status 04/20/2017 FINAL  Final     Scheduled Meds: . furosemide  40 mg Intravenous Once  . isosorbide mononitrate  15 mg Oral Q breakfast  . mouth rinse  15 mL Mouth Rinse BID  .  metoprolol succinate  25 mg Oral Q breakfast   Continuous Infusions: . meropenem (MERREM) IV 500 mg (04/20/17 1108)    Procedures/Studies: Dg Chest 2 View  Result Date: 04/13/2017 CLINICAL DATA:  Leukocytosis, history COPD, hypertension, recent 2 week hospitalization for acute diverticulitis with abscess post percutaneous drainage EXAM: CHEST  2 VIEW COMPARISON:  04/10/2017 FINDINGS: Upper normal heart size. Slight pulmonary vascular congestion. Atherosclerotic calcification aorta. Mild bronchitic changes with persistent elevation of RIGHT diaphragm and basilar atelectasis. No acute infiltrate, pleural effusion or pneumothorax. Bones demineralized. IMPRESSION: Mild bronchitic changes and RIGHT basilar atelectasis. No acute infiltrate. Aortic Atherosclerosis (ICD10-I70.0). Electronically Signed   By: Lavonia Dana M.D.   On: 04/13/2017 17:38   Ct Abdomen Pelvis W Contrast  Result Date: 04/16/2017 CLINICAL DATA:  66 year old female with history of prior acute diverticulitis and diverticular abscess. Follow-up to evaluate for resolution of abscess. EXAM: CT ABDOMEN AND PELVIS WITH CONTRAST TECHNIQUE: Multidetector CT imaging of the abdomen and pelvis was performed using the standard protocol following bolus administration of intravenous contrast. CONTRAST:  128mL ISOVUE-300 IOPAMIDOL (ISOVUE-300) INJECTION 61% COMPARISON:  CT the abdomen and pelvis 04/10/2017. FINDINGS: Lower chest: Scarring in the right lower lobe. Calcifications of the aortic valve and mitral annulus. Mild cardiomegaly. Atherosclerotic calcifications of the right coronary artery. Hepatobiliary: No cystic or solid hepatic lesions. No intra or extrahepatic biliary ductal dilatation. Gallbladder is normal in appearance. Pancreas: No pancreatic mass. No pancreatic ductal dilatation. No pancreatic or peripancreatic fluid or inflammatory changes. Spleen: Unremarkable. Adrenals/Urinary Tract: 1.4 cm simple cyst in the lower pole of the left  kidney. Other subcentimeter low-attenuation lesions in both kidneys are too small to definitively characterize, but similar to prior studies, favored to represent tiny cysts. 1 cm nodule in the lateral limb of the left adrenal gland is unchanged and incompletely characterized on today's examination but similar to prior studies dating back to 11/12/2007, presumably a small adenoma. Right adrenal gland is normal in appearance. No hydroureteronephrosis. Urinary bladder is normal in appearance. Stomach/Bowel: The appearance of the stomach is normal. There is no pathologic dilatation of small bowel or colon. Numerous colonic diverticulae are again noted, particularly in the region of the sigmoid colon. When compared to the prior examination, the inflammatory changes adjacent to the mid sigmoid colon are very similar, compatible with residual diverticulitis. This is centered around a small diverticular abscess which is slightly larger than the prior study, currently measuring 2.6 x 2.0 x 2.5 cm (axial image 68 of series 2 and coronal image 70 of series 5). The appendix is not confidently identified and may be surgically absent. Regardless, there are no inflammatory changes noted adjacent to the cecum to suggest the presence of an acute appendicitis at this time. Vascular/Lymphatic: Aortic atherosclerosis, without evidence of aneurysm or dissection in the abdominal or pelvic vasculature. No lymphadenopathy noted in the abdomen or pelvis. Reproductive: Uterus and ovaries are unremarkable in appearance. Other: No significant volume of ascites.  No pneumoperitoneum. Musculoskeletal: There are no aggressive appearing lytic or blastic lesions noted in the visualized portions of the skeleton. IMPRESSION: 1. Persistent sigmoid diverticulitis with small diverticular abscess in the mid sigmoid colon which is slightly larger than the prior study, as above. 2. Aortic atherosclerosis, in addition to at least right coronary artery  disease. Please note that although the presence of coronary artery calcium documents the presence of coronary artery disease, the  severity of this disease and any potential stenosis cannot be assessed on this non-gated CT examination. Assessment for potential risk factor modification, dietary therapy or pharmacologic therapy may be warranted, if clinically indicated. 3. Mild cardiomegaly. 4. There are calcifications of the aortic valve and mitral annulus. Echocardiographic correlation for evaluation of potential valvular dysfunction may be warranted if clinically indicated. 5. Additional incidental findings, as above. Aortic Atherosclerosis (ICD10-I70.0). Electronically Signed   By: Vinnie Langton M.D.   On: 04/16/2017 18:07   Ct Abdomen Pelvis W Contrast  Result Date: 04/11/2017 CLINICAL DATA:  Vomiting and diarrhea since discharge. History of recent diverticular abscess. EXAM: CT ABDOMEN AND PELVIS WITH CONTRAST TECHNIQUE: Multidetector CT imaging of the abdomen and pelvis was performed using the standard protocol following bolus administration of intravenous contrast. CONTRAST:  137mL ISOVUE-300 IOPAMIDOL (ISOVUE-300) INJECTION 61% COMPARISON:  03/29/2017 FINDINGS: Lower chest: The included heart is normal in size without pericardial effusion. There is coronary arteriosclerosis. No pericardial effusion. Lung bases demonstrate dependent atelectasis. No effusion or pneumothorax. Hepatobiliary: Mild hepatic steatosis. Calcified granuloma in the right hepatic dome. Physiologic distention of the gallbladder without wall thickening or calculi. No biliary ductal dilatation. Pancreas: Unremarkable. No pancreatic ductal dilatation or surrounding inflammatory changes. Spleen: Normal in size without focal abnormality. Adrenals/Urinary Tract: Adrenal glands demonstrate stable nodularity of the anterior limb of the left adrenal gland measuring up to 1 cm. Kidneys are normal, without renal calculi, focal lesion, or  hydronephrosis. Bladder is unremarkable. Stomach/Bowel: Smaller intramural diverticular abscess along the distal sigmoid colon currently estimated at 2 x 1.6 cm versus 3.6 x 3 cm previously. No new areas of inflammation are identified. There is re- demonstration descending and sigmoid diverticulosis. No bowel obstruction. Physiologic distention of the stomach. There is normal small bowel rotation. Vascular/Lymphatic: No aortic aneurysm. There is aortoiliac and branch vessel atherosclerosis. No lymphadenopathy. Reproductive: Uterus and bilateral adnexa are unremarkable. Other: No abdominal wall hernia or abnormality. No abdominopelvic ascites. Musculoskeletal: Degenerative disc disease L2 through S1 with vacuum disc phenomenon, disc space narrowing and discogenic sclerosis of the endplates. Associated lumbar facet arthropathy is seen. No acute osseous abnormality is noted. IMPRESSION: 1. Smaller intramural diverticular abscess currently estimated at 2 x 1.6 cm versus 3.6 x 3 cm since prior exam. No recurrence or new inflammatory process noted. 2. No bowel obstruction. 3. Mild hepatic steatosis. 4. Stable left adrenal 1 cm nodule too small further characterize. Electronically Signed   By: Ashley Royalty M.D.   On: 04/11/2017 00:41   Dg Chest Port 1 View  Result Date: 04/19/2017 CLINICAL DATA:  Chest pain and dyspnea. History of COPD, former smoker. EXAM: PORTABLE CHEST 1 VIEW COMPARISON:  PA and lateral chest x-ray of April 13, 2017 FINDINGS: There is persistent elevation of the right hemidiaphragm. The pulmonary interstitial markings are increased in the pulmonary vascularity is more engorged. The cardiac silhouette is enlarged. There is calcification in the wall of the aortic arch. The trachea is midline. The bony thorax exhibits no acute abnormality. IMPRESSION: Interval development of pulmonary vascular congestion and mild pulmonary interstitial edema consistent with CHF. Thoracic aortic atherosclerosis.  Electronically Signed   By: Ellyanna Holton  [place] M.D.   On: 04/19/2017 15:17   Dg Abd Acute W/chest  Result Date: 04/10/2017 CLINICAL DATA:  66 y/o  F; vomiting and diarrhea. EXAM: DG ABDOMEN ACUTE W/ 1V CHEST COMPARISON:  03/24/2017 chest radiograph FINDINGS: Low lung volumes accentuate pulmonary markings. Minor bibasilar atelectasis. No focal consolidation. Normal cardiac silhouette given projection and technique.  Aortic atherosclerosis with calcification. Moderate lumbar rotatory levocurvature.  Normal bowel gas pattern. IMPRESSION: Normal bowel gas pattern. Low lung volumes with minor bibasilar atelectasis. Aortic atherosclerosis. Electronically Signed   By: Kristine Garbe M.D.   On: 04/10/2017 21:49    Claudeen Leason, DO  [facility] Pager [phone]  If 7PM-7AM, please contact night-coverage [email or web address] Password [email or web address] 04/20/2017, 11:12 AM   LOS: 9 days

## 2017-04-20 NOTE — Sedation Documentation (Signed)
Dr. Pascal Lux decided to not precede with the procedure.

## 2017-04-20 NOTE — Progress Notes (Signed)
CT drain not placed because Dr. Pascal Lux stated there was nothing to drain.  Pt brought back to IR holding at the nurses area.  Pt continues to have elevated heart rate and respiratory rate.  Pt is waiting for a step down bed. Will continue to monitor.

## 2017-04-20 NOTE — Progress Notes (Signed)
Spoke with Roe Coombs from IR.  IR MD concerned pt may be septic and wanted pt to stay at [facility] for closer follow up given her elevated WBC and pulm status.  Pt had to be placed on oxygen with saturation 94% on 2L. HR 118 There would not be IR follow up at [facility] on 8/17. I spoke with Dr. Aggie Moats from [facility] who graciously accepted patient.  DTat

## 2017-04-20 NOTE — Care Management Note (Signed)
Case Management Note  Patient Details  Name: Kathleen Valenzuela MRN: [id number] Date of Birth: May 23, 1951  If discussed at Long Length of Stay Meetings, dates discussed:  04/20/2017    Sherald Barge, RN 04/20/2017, 12:55 PM

## 2017-04-21 ENCOUNTER — Encounter (HOSPITAL_COMMUNITY): Payer: Self-pay | Admitting: Internal Medicine

## 2017-04-21 DIAGNOSIS — E669 Obesity, unspecified: Secondary | ICD-10-CM | POA: Diagnosis present

## 2017-04-21 DIAGNOSIS — L0291 Cutaneous abscess, unspecified: Secondary | ICD-10-CM

## 2017-04-21 DIAGNOSIS — I700 Atherosclerosis of aorta: Secondary | ICD-10-CM | POA: Diagnosis present

## 2017-04-21 DIAGNOSIS — I5033 Acute on chronic diastolic (congestive) heart failure: Secondary | ICD-10-CM | POA: Diagnosis present

## 2017-04-21 HISTORY — DX: Atherosclerosis of aorta: I70.0

## 2017-04-21 LAB — CBC
HEMATOCRIT: 39.9 % (ref 36.0–46.0)
Hemoglobin: 13.3 g/dL (ref 12.0–15.0)
MCH: 30.2 pg (ref 26.0–34.0)
MCHC: 33.3 g/dL (ref 30.0–36.0)
MCV: 90.7 fL (ref 78.0–100.0)
PLATELETS: 234 10*3/uL (ref 150–400)
RBC: 4.4 MIL/uL (ref 3.87–5.11)
RDW: 15.6 % — AB (ref 11.5–15.5)
WBC: 15.9 10*3/uL — AB (ref 4.0–10.5)

## 2017-04-21 LAB — BASIC METABOLIC PANEL
Anion gap: 10 (ref 5–15)
BUN: 23 mg/dL — AB (ref 6–20)
CALCIUM: 9.2 mg/dL (ref 8.9–10.3)
CO2: 26 mmol/L (ref 22–32)
CREATININE: 1.08 mg/dL — AB (ref 0.44–1.00)
Chloride: 106 mmol/L (ref 101–111)
GFR calc Af Amer: >60 mL/min (ref >60)
GFR, EST NON AFRICAN AMERICAN: 53 mL/min — AB (ref >60)
GLUCOSE: 131 mg/dL — AB (ref 65–99)
Potassium: 2.7 mmol/L — CL (ref 3.5–5.1)
Sodium: 142 mmol/L (ref 135–145)

## 2017-04-21 LAB — URINE CULTURE: CULTURE: NO GROWTH

## 2017-04-21 MED ORDER — DIPHENOXYLATE-ATROPINE 2.5-0.025 MG PO TABS
2.0000 | ORAL_TABLET | Freq: Four times a day (QID) | ORAL | Status: DC
  Administered 2017-04-21: 2 via ORAL

## 2017-04-21 MED ORDER — POTASSIUM CHLORIDE CRYS ER 20 MEQ PO TBCR
40.0000 meq | EXTENDED_RELEASE_TABLET | Freq: Once | ORAL | Status: AC
  Administered 2017-04-21: 40 meq via ORAL
  Filled 2017-04-21: qty 2

## 2017-04-21 MED ORDER — ALPRAZOLAM 0.25 MG PO TABS
0.2500 mg | ORAL_TABLET | Freq: Three times a day (TID) | ORAL | Status: DC | PRN
  Administered 2017-04-21 (×2): 0.25 mg via ORAL
  Filled 2017-04-21 (×2): qty 1

## 2017-04-21 MED ORDER — POTASSIUM CHLORIDE 10 MEQ/50ML IV SOLN
INTRAVENOUS | Status: AC
  Filled 2017-04-21: qty 50

## 2017-04-21 MED ORDER — POTASSIUM CHLORIDE 10 MEQ/100ML IV SOLN
10.0000 meq | INTRAVENOUS | Status: AC
  Administered 2017-04-21 (×2): 10 meq via INTRAVENOUS
  Filled 2017-04-21 (×2): qty 100

## 2017-04-21 MED ORDER — POTASSIUM CHLORIDE 10 MEQ/50ML IV SOLN
10.0000 meq | INTRAVENOUS | Status: AC
  Administered 2017-04-21 (×6): 10 meq via INTRAVENOUS
  Filled 2017-04-21 (×6): qty 50

## 2017-04-21 MED ORDER — BOOST / RESOURCE BREEZE PO LIQD
1.0000 | Freq: Three times a day (TID) | ORAL | Status: DC
  Administered 2017-04-21 – 2017-04-22 (×4): 1 via ORAL

## 2017-04-21 MED ORDER — HYDROCODONE-ACETAMINOPHEN 5-325 MG PO TABS
1.0000 | ORAL_TABLET | Freq: Once | ORAL | Status: AC
  Administered 2017-04-21: 1 via ORAL
  Filled 2017-04-21: qty 1

## 2017-04-21 MED ORDER — ONDANSETRON HCL 4 MG/2ML IJ SOLN
4.0000 mg | Freq: Once | INTRAMUSCULAR | Status: AC
  Administered 2017-04-21: 4 mg via INTRAVENOUS
  Filled 2017-04-21: qty 2

## 2017-04-21 NOTE — Progress Notes (Signed)
Physical Therapy Treatment Patient Details Name: Kathleen Valenzuela MRN: [id number] DOB: 1951-08-24 Today's Date: 04/21/2017    History of Present Illness 66 yo with diverticular abscess transferred to [facility] 8/16 for drainage of abscess cancelled periprocedure. PMHx: leukocytosis, COPD, HTN, diverticular abscess, bipolar, dementia    PT Comments    Pt fatigued and able to walk limited distance today with some PVCs and therefore gait ceased with low potassium although repletion initiated. Pt reports feeling weak, having not eaten or been out of bed for days. Pt educated for transfers, HEP and gait with encouragement to continue mobility and progression with nursing. Pt with significantly decreased mobility than her PLOF at ALF and currently will benefit from SNF at D/C.   HR 89-122 SpO2 100-92% on 2L   Follow Up Recommendations  SNF;Supervision for mobility/OOB     Equipment Recommendations  Rolling walker with 5" wheels    Recommendations for Other Services       Precautions / Restrictions Precautions Precautions: Fall    Mobility  Bed Mobility Overal bed mobility: Needs Assistance Bed Mobility: Supine to Sit     Supine to sit: Min assist     General bed mobility comments: use of rail, increased time and cues for sequence  Transfers Overall transfer level: Needs assistance   Transfers: Sit to/from Stand Sit to Stand: Min assist         General transfer comment: cues for scooting to edge, [employer] placement, assist to rise  Ambulation/Gait Ambulation/Gait assistance: Min assist;+2 safety/equipment Ambulation Distance (Feet): 14 Feet Assistive device: Rolling walker (2 wheeled) Gait Pattern/deviations: Step-through pattern;Decreased stride length;Trunk flexed   Gait velocity interpretation: Below normal speed for age/gender General Gait Details: cues for posture, increased stride and safety, limited by fatigue HR up to 122   Stairs            Wheelchair  Mobility    Modified Rankin (Stroke Patients Only)       Balance                                            Cognition Arousal/Alertness: Awake/alert Behavior During Therapy: Flat affect Overall Cognitive Status: No family/caregiver present to determine baseline cognitive functioning                                        Exercises General Exercises - Lower Extremity Long Arc Quad: AROM;Both;Seated;10 reps Hip Flexion/Marching: AROM;Both;Seated;10 reps    General Comments        Pertinent Vitals/Pain Pain Assessment: 0-10 Pain Score: 6  Pain Location: stomack Pain Descriptors / Indicators: Throbbing Pain Intervention(s): Limited activity within patient's tolerance;Repositioned;Monitored during session    Home Living                      Prior Function            PT Goals (current goals can now be found in the care plan section) Acute Rehab PT Goals Time For Goal Achievement: 05/05/17 (pt with transfer of facilities, no therapy for 6 days , goals appropriate and date updated) Potential to Achieve Goals: Fair Progress towards PT goals: Progressing toward goals    Frequency    Min 3X/week      PT Plan Discharge plan needs  to be updated    Co-evaluation              AM-PAC PT "6 Clicks" Daily Activity  Outcome Measure  Difficulty turning over in bed (including adjusting bedclothes, sheets and blankets)?: Unable Difficulty moving from lying on back to sitting on the side of the bed? : Unable Difficulty sitting down on and standing up from a chair with arms (e.g., wheelchair, bedside commode, etc,.)?: Unable Help needed moving to and from a bed to chair (including a wheelchair)?: A Lot Help needed walking in hospital room?: A Little Help needed climbing 3-5 steps with a railing? : A Lot 6 Click Score: 10    End of Session Equipment Utilized During Treatment: Gait belt;Oxygen Activity Tolerance: Patient  tolerated treatment well Patient left: in chair;with call bell/phone within reach;with chair alarm set Nurse Communication: Mobility status PT Visit Diagnosis: Difficulty in walking, not elsewhere classified (R26.2);Muscle weakness (generalized) (M62.81);Other abnormalities of gait and mobility (R26.89)     Time: 9030-0923 PT Time Calculation (min) (ACUTE ONLY): 28 min  Charges:  $Therapeutic Exercise: 8-22 mins $Therapeutic Activity: 8-22 mins                    G Codes:       Elwyn Reach, PT [phone]    [place] Delissa Silba 04/21/2017, 9:27 AM

## 2017-04-21 NOTE — Clinical Social Work Note (Signed)
CSW spoke with patient's son, [name], regarding SNF placement. He would prefer for her to return to [facility] with HHPT. RNCM notified. Patient's son also notified of transfer order. RN will call him once she knows where patient is going.  Kathleen Valenzuela, [place]

## 2017-04-21 NOTE — Progress Notes (Signed)
Pt with 4 episodes of watery, diarrhea in last 5 hrs. MD on call notified and order given for rectal tube placement. Will implement orders and continue to monitor.

## 2017-04-21 NOTE — Care Management Note (Addendum)
Case Management Note  Patient Details  Name: Kathleen Valenzuela MRN: [id number] Date of Birth: 01-07-1951  Subjective/Objective:                  Admitted with sepsis. From [facility] with [facility] RN/PT through [facility]. [place] [facility] rep, aware of admission. CSW aware of pt coming from placement.   Action/Plan: Plan for return to ALF with resumption of [facility] services. Pt will need order for resumption of services. CSW to make arrangements for return to facility. DC not anticipated over weekend.   Expected Discharge Date:       04/16/2017           Expected Discharge Plan:  Assisted Living / Rest Home (with [facility] services)  In-House Referral:  Clinical Social Work  Discharge planning Services  CM Consult  Post Acute Care Choice:  Durable Medical Equipment Choice offered to:  Patient  HH Arranged:  Therapist, sports, PT HH Agency:  [facility]  Status of Service:    in progress, cont to follow.   04/21/2017  Pt is only oriented to self - CSW spoke with son [name] and SNF has been declined.  HH is still acceptable with [facility] with the addition of SW.  CM confirmed that Nan[facility] Means can provide [facility] , CM will need to fax [facility] orders to Santiago Glad with [facility] at [phone].  CM will request [facility] orders and face to face  SNF recommended - CSW consulted 8/17 Maryclare Labrador, RN 04/21/2017, 9:47 AM

## 2017-04-21 NOTE — Progress Notes (Signed)
Nutrition Follow Up  DOCUMENTATION CODES:   Obesity unspecified  INTERVENTION:    Boost Breeze po TID, each supplement provides 250 kcal and 9 grams of protein  NUTRITION DIAGNOSIS:   Increased nutrient needs related to acute illness, chronic illness as evidenced by estimated nutrition needs, ongoing  GOAL:   Patient will meet greater than or equal to 90% of their needs, progressing  MONITOR:   PO intake, Supplement acceptance, Labs, Weight trends, Skin, I & O's  ASSESSMENT:  66 y/o female PMHx bipolar disorder, early dementia, HTN, GERD, Anxiety, COPD. Recently admitted for 2 weeks at OSH due to diverticulitis w/ abscess s/p perc drainage. D/Cd from hospital 7/31, however developed N/V/D 3 days later and could not keep food/drink down. Presented to the ED and was found to be hypokalemic. Admitted for evaluation of possible CDiff.   Pt admitted with sepsis. Transferred from [facility] to [facility] 8/16. CT guided aspiration/drain placement per IR cancelled. Speaking with Doristine Bosworth at time of RD visit. Spoke briefly with pt.  Reports a decreased appetite. + N/V. Currently on Clear Liquids. Amenable to trying Boost Breeze nutrition supplements. Labs and medications reviewed. K 2.7 (L).  Diet Order:  Diet clear liquid Room service appropriate? Yes; Fluid consistency: Thin  Skin:  Reviewed, no issues  Last BM:  8/17  Height:   Ht Readings from Last 1 Encounters:  04/20/17 5\' 2"  (1.575 m)   Weight:   Wt Readings from Last 1 Encounters:  04/21/17 186 lb 12.8 oz (84.7 kg)   Wt Readings from Last 10 Encounters:  04/21/17 186 lb 12.8 oz (84.7 kg)  01/02/17 204 lb (92.5 kg)  12/01/16 198 lb 1.6 oz (89.9 kg)  12/23/15 172 lb (78 kg)  10/29/15 162 lb (73.5 kg)  08/03/15 162 lb 14.4 oz (73.9 kg)   Ideal Body Weight:  50 kg  BMI:  Body mass index is 34.17 kg/m.  Estimated Nutritional Needs:   Kcal:  1600-1800   Protein:  70-85 gm   Fluid:  1.6-1.8 L/day  EDUCATION NEEDS:    No education needs identified at this time  Arthur Holms, RD, LDN Pager #: [phone] After-Hours Pager #: [phone]

## 2017-04-21 NOTE — Progress Notes (Signed)
Progress Note    Kathleen Valenzuela  YIR:[id number] DOB: 10/28/1950  DOA: 04/10/2017 PCP: Patient, No Pcp Per    Brief Narrative:   Chief complaint: Follow-up diverticular abscess  Medical records reviewed and are as summarized below:  Kathleen Valenzuela is an 66 y.o. female with PMH of bipolar disorder, early dementia on Aricept, COPD, hypertension, recent hospitalization for treatment of acute diverticulitis with abscess status post percutaneous drainage (discharge 04/04/17 on oral Cipro/Flagyl) who was admitted 04/10/17 from her SNF with a chief complaint of diarrhea associated with vomiting. Upon initial evaluation, her potassium was found to be 2.2. Stool studies were sent.   Assessment/Plan:   Principal Problems:   Diverticulitis/Diverticular abscess of colon CT of the abdomen done in the ED showed a smaller intramural diverticular abscess with no evidence of bowel obstruction or recurrent/new inflammatory process.Treated with IV fluids, anti-emetics, and bowel rest, and IV Cipro/Flagyl. C. difficile PCR was negative. Symptoms improved and diet was advanced to soft on 04/12/17. CT repeated 04/16/17 secondary to persistent leukocytosis, and showed persistent sigmoid diverticulitis, and a slightly larger abscess pocket. Case discussed with general surgery and IR on 04/18/17. On 04/19/17, antibiotics broadened to merrem due to persistent fever and leukocytosis. Patient transferred from [facility] for percutaneous drain placement however CT imaging demonstrated a reduction/near resolution of diverticular abscess, so CT-guided aspiration/drain placement was not performed.    Coagulase-negative Staphylococcus bacteremia Blood culture 1 from 04/13/17 was positive for staph. Vancomycin started and repeat blood culture sent 04/15/17. 2-D echo done to rule out endocarditis. Final culture consistent with coagulase-negative staph, felt to be a contaminant, vancomycin subsequently discontinued.  Active Problems:  Acute kidney injury Likely from GI losses.    Hypokalemia Aggressively replace. Likely from diuresis and recent vomiting and ongoing diarrhea.    Intractable vomiting/diarrhea Thought to be from metronidazole. Appears to be better today, patient denies nausea and vomiting. Has a rectal tube in for diarrhea. Given Lomotil 28/17/18.    Bipolar disorder (HCC)/Acute encephalopathy Patient normally takes lithium, trazodone, and Xanax. These have been discontinued secondary to encephalopathy. Was given Narcan on 04/17/17 overnight. Patient more awake today and now requesting resumption of Xanax. Will resume Xanax at lower dose.    Early onset Alzheimer's dementia Aricept remains on hold.    Acute respiratory failure with hypoxia/COPD (chronic obstructive pulmonary disease) (HCC)/pulmonary edema Chest x-ray 04/19/17 showed pulmonary edema. She was provided with supplemental oxygen and diuresis. Respiratory status currently stable. Continue when necessary bronchodilators.    Vaginal candidiasis Status post Diflucan.    Obesity Body mass index is 34.17 kg/m.     Aortic atherosclerosis/acute on chronic diastolic CHF 2D echo done 04/15/17. EF 55-60 percent. Grade 1 diastolic dysfunction. Mild LVH. No regional wall motion abnormalities. I/O balance -1.4 L.  Family Communication/Anticipated D/C date and plan/Code Status   DVT prophylaxis: Lovenox ordered. Code Status: Full Code.  Family Communication: No family present at the bedside. Disposition Plan: Home versus SNF when diet fully advanced. Will need PT/OT evaluations to determine appropriate disposition.   Medical Consultants:    Interventional Radiology  General Surgery   Anti-Infectives:    Cipro 8/7/---> 04/19/17  Flagyl 04/11/17---> 04/19/17  Meropenem 04/19/17--->  Subjective:   Thirsty.  Begs me to let her drink something. Denies nausea and vomiting.  Reports occasional abdominal pain and some shortness of breath.  Rectal  tube now in.  Objective:    Vitals:   04/21/17 0015 04/21/17 0410 04/21/17 5009 04/21/17 0730  BP: 104/72 106/64    Pulse: 90 88    Resp: (!) 26 (!) 21    Temp: 98.5 F (36.9 C)   98.6 F (37 C)  TempSrc: Oral   Oral  SpO2: 95% 96%    Weight:   84.7 kg (186 lb 12.8 oz)   Height:        Intake/Output Summary (Last 24 hours) at 04/21/17 0832 Last data filed at 04/21/17 0717  Gross per 24 hour  Intake              500 ml  Output             1900 ml  Net            -1400 ml   Filed Weights   04/11/17 0242 04/20/17 1728 04/21/17 0613  Weight: 85.8 kg (189 lb 3.2 oz) 82.7 kg (182 lb 4.8 oz) 84.7 kg (186 lb 12.8 oz)    Exam: General: Elderly female in no acute distress, but uncomfortable appearing with dry mucous membranes. Cardiovascular: Heart sounds show a regular rate, and rhythm. No gallops or rubs. No murmurs. No JVD. Lungs: Clear to auscultation bilaterally with good air movement. No rales, rhonchi or wheezes. Abdomen: Soft, nontender, nondistended with normal active bowel sounds. No masses. No hepatosplenomegaly. Neurological: Alert and oriented 2. Moves all extremities 4 with equal strength. Cranial nerves II through XII grossly intact. Skin: Warm and dry. No rashes or lesions. Extremities: No clubbing or cyanosis. No edema. Pedal pulses 2+. Psychiatric: Mood and affect are normal. Insight and judgment are impaired.   Data Reviewed:   I have personally reviewed following labs and imaging studies:  Labs: Labs show the following: Sodium 142, potassium 2.7, chloride 106, bicarbonate 26, BUN 23, creatinine 1.08, glucose 131. LFTs unremarkable with a low albumin of 2.9. INR 1.31. WBC 23.3 --->15.9, hemoglobin 13.3, platelets 234.  Microbiology: Blood culture from right [place] 04/11/17: No growth. Blood culture from right [place] 04/11/17: No growth. C. difficile studies 04/12/17: Negative. Blood culture from right [place] 04/13/17: Positive for methicillin-resistant  coagulase-negative staph. Blood culture from right arm 04/13/17: No growth Urine culture 04/14/17: No growth.  Blood culture from a line 04/15/17: No growth. Blood culture from right Storti 04/15/17: No growth.  Procedures and diagnostic studies:  04/10/17: Acute abdominal series: Normal bowel gas pattern with low lung volumes. 04/10/17: CT abdomen and pelvis: Diverticular abscess tox 1.6 cm (was 3.6x 3 cm on prior exam) 04/13/17: Chest x-ray: Mild bronchitic changes and right basilar atelectasis. Aortic atherosclerosis. 04/15/17: 2-D echocardiogram: EF 55-60 percent, grade 1 diastolic dysfunction, mild LVH, no regional wall motion abnormalities. 04/16/17: CT abdomen and pelvis: Persistent sigmoid diverticulitis with small diverticular abscess which is slightly larger. 04/19/17: Portable chest x-ray: Interval development of pulmonary vascular congestion and mild interstitial edema consistent with CHF. 04/20/17: CT of pelvis: My independent review of the images shows: Significant reduction in size of abscess as shown below. Formal read: Reduction of diverticular abscess to 1.9x 1.2 cm. CT-guided aspiration not attempted.     Medications:   . furosemide  40 mg Intravenous BID  . isosorbide mononitrate  15 mg Oral Q breakfast  . mouth rinse  15 mL Mouth Rinse BID  . metoprolol succinate  25 mg Oral Q breakfast   Continuous Infusions: . meropenem (MERREM) IV 1 g (04/21/17 0610)    LOS: 10 days   RAMA,CHRISTINA  [facility] Pager [phone]. If unable to reach me by pager, please call my  cell phone at [phone].  *Please refer to [email or web address], password [email or web address] to get updated schedule on who will round on this patient, as hospitalists switch teams weekly. If 7PM-7AM, please contact night-coverage at [email or web address], password [email or web address] for any overnight needs.  04/21/2017, 8:32 AM

## 2017-04-21 NOTE — Progress Notes (Signed)
NURSING PROGRESS NOTE  IONNA AVIS [id number] Transfer Data: 04/21/2017 5:59 PM Attending Provider: Tonye Royalty, MD OTL:[id number], No Pcp Per Code Status: FULL   YOUNG BRIM is a 66 y.o. female patient transferred from [facility]  -No acute distress noted.  -No complaints of shortness of breath.  -No complaints of chest pain.   Last Documented Vital Signs: Blood pressure 106/70, pulse 87, temperature 97.9 F (36.6 C), temperature source Oral, resp. rate (!) 21, height 5\' 2"  (1.575 m), weight 84.7 kg (186 lb 12.8 oz), SpO2 100 %.  IV Fluids:  IV in place, occlusive dsg intact without redness, IV cath L Brachial Picc IV push only, no IV fluids.   Allergies:  Penicillins  Past Medical History:   has a past medical history of Anemia; Anxiety; Aortic atherosclerosis ([place]) (04/21/2017); Arthritis; Bipolar 1 disorder ([place]); Chronic back pain; COPD (chronic obstructive pulmonary disease) ([place]); Diastolic dysfunction with heart failure ([place]) (04/20/2017); Duodenal stricture; Early onset Alzheimer's dementia; GERD (gastroesophageal reflux disease); Heart murmur; High cholesterol; Hypertension; and Shortness of breath dyspnea.  Past Surgical History:   has a past surgical history that includes Foot surgery (Right); Colonoscopy; Esophagogastroduodenoscopy (N/A, 10/29/2015); Esophageal dilation (N/A, 10/29/2015); Hemorrhoid surgery; Cesarean section; Esophagogastroduodenoscopy (egd) with propofol (N/A, 01/29/2016); Esophageal dilation (N/A, 01/29/2016); Esophagogastroduodenoscopy (egd) with propofol (N/A, 01/06/2017); Esophageal dilation (N/A, 01/06/2017); biopsy (01/06/2017); and Esophagogastroduodenoscopy (egd) with propofol (N/A, 02/17/2017).  Social History:   reports that she quit smoking about 20 months ago. Her smoking use included Cigarettes. She has a 70.00 pack-year smoking history. She has never used smokeless tobacco. She reports that she does not drink alcohol or use drugs.  Skin: intact except where  otherwise charted   Patient/Family orientated to room. Information packet given to patient/family. Admission inpatient armband information verified with patient/family to include name and date of birth and placed on patient arm. Side rails up x 2, fall assessment and education completed with patient/family. Patient/family able to verbalize understanding of risk associated with falls and verbalized understanding to call for assistance before getting out of bed. Call light within reach. Patient/family able to voice and demonstrate understanding of unit orientation instructions.

## 2017-04-21 NOTE — Progress Notes (Signed)
Pt will nausea and vomiting at this time. IV morphine administered within past 45 minutes and pt states that it does make her nauseous at times. MD on call paged for anti-emetic as PRN zofran not due at this time. Will continue to monitor.

## 2017-04-21 NOTE — Progress Notes (Signed)
CRITICAL VALUE STICKER  CRITICAL VALUE: potassium 2.7   RECEIVER (on-site recipient of call): Sydell Axon  DATE & TIME NOTIFIED: 04/21/2017 0545  MESSENGER (representative from lab):  MD NOTIFIED: Kennon Holter, NP  TIME OF NOTIFICATION: 04/21/2017 0553  RESPONSE:  Orders given. At [phone]

## 2017-04-22 DIAGNOSIS — J9601 Acute respiratory failure with hypoxia: Secondary | ICD-10-CM

## 2017-04-22 DIAGNOSIS — I5033 Acute on chronic diastolic (congestive) heart failure: Secondary | ICD-10-CM

## 2017-04-22 DIAGNOSIS — K5732 Diverticulitis of large intestine without perforation or abscess without bleeding: Secondary | ICD-10-CM

## 2017-04-22 LAB — BASIC METABOLIC PANEL
ANION GAP: 7 (ref 5–15)
BUN: 22 mg/dL — ABNORMAL HIGH (ref 6–20)
CHLORIDE: 105 mmol/L (ref 101–111)
CO2: 23 mmol/L (ref 22–32)
Calcium: 8.8 mg/dL — ABNORMAL LOW (ref 8.9–10.3)
Creatinine, Ser: 0.8 mg/dL (ref 0.44–1.00)
Glucose, Bld: 127 mg/dL — ABNORMAL HIGH (ref 65–99)
POTASSIUM: 3.3 mmol/L — AB (ref 3.5–5.1)
SODIUM: 135 mmol/L (ref 135–145)

## 2017-04-22 LAB — C DIFFICILE QUICK SCREEN W PCR REFLEX
C DIFFICILE (CDIFF) INTERP: NOT DETECTED
C DIFFICILE (CDIFF) TOXIN: NEGATIVE
C DIFFICLE (CDIFF) ANTIGEN: NEGATIVE

## 2017-04-22 LAB — CBC
HCT: 36.9 % (ref 36.0–46.0)
HEMOGLOBIN: 12.2 g/dL (ref 12.0–15.0)
MCH: 29.9 pg (ref 26.0–34.0)
MCHC: 33.1 g/dL (ref 30.0–36.0)
MCV: 90.4 fL (ref 78.0–100.0)
PLATELETS: 224 10*3/uL (ref 150–400)
RBC: 4.08 MIL/uL (ref 3.87–5.11)
RDW: 15.2 % (ref 11.5–15.5)
WBC: 11.9 10*3/uL — ABNORMAL HIGH (ref 4.0–10.5)

## 2017-04-22 LAB — MAGNESIUM: MAGNESIUM: 2 mg/dL (ref 1.7–2.4)

## 2017-04-22 MED ORDER — POTASSIUM CHLORIDE 10 MEQ/50ML IV SOLN: 10.0000 meq | INTRAVENOUS | Status: DC

## 2017-04-22 MED ORDER — POTASSIUM CHLORIDE 20 MEQ/15ML (10%) PO SOLN
40.0000 meq | Freq: Three times a day (TID) | ORAL | Status: DC
  Administered 2017-04-22 (×3): 40 meq via ORAL
  Filled 2017-04-22 (×3): qty 30

## 2017-04-22 MED ORDER — OXYCODONE HCL 5 MG PO TABS
5.0000 mg | ORAL_TABLET | ORAL | Status: DC | PRN
  Administered 2017-04-22: 5 mg via ORAL
  Filled 2017-04-22 (×2): qty 1

## 2017-04-22 MED ORDER — KETOROLAC TROMETHAMINE 15 MG/ML IJ SOLN
15.0000 mg | Freq: Once | INTRAMUSCULAR | Status: AC
  Administered 2017-04-22: 15 mg via INTRAVENOUS
  Filled 2017-04-22: qty 1

## 2017-04-22 MED ORDER — HYDROMORPHONE HCL 1 MG/ML IJ SOLN: 0.5000 mg | INTRAMUSCULAR | Status: DC | PRN

## 2017-04-22 MED ORDER — ALPRAZOLAM 0.5 MG PO TABS
0.5000 mg | ORAL_TABLET | Freq: Three times a day (TID) | ORAL | Status: DC | PRN
  Administered 2017-04-22 – 2017-04-24 (×3): 0.5 mg via ORAL
  Filled 2017-04-22 (×3): qty 1

## 2017-04-22 MED ORDER — OXYCODONE HCL 5 MG PO TABS
10.0000 mg | ORAL_TABLET | ORAL | Status: DC | PRN
  Administered 2017-04-22 – 2017-04-24 (×11): 10 mg via ORAL
  Filled 2017-04-22 (×10): qty 2

## 2017-04-22 MED ORDER — DONEPEZIL HCL 5 MG PO TABS
5.0000 mg | ORAL_TABLET | Freq: Every day | ORAL | Status: DC
  Administered 2017-04-22 – 2017-04-23 (×2): 5 mg via ORAL
  Filled 2017-04-22 (×2): qty 1

## 2017-04-22 MED ORDER — ENOXAPARIN SODIUM 40 MG/0.4ML ~~LOC~~ SOLN
40.0000 mg | SUBCUTANEOUS | Status: DC
  Administered 2017-04-22 – 2017-04-24 (×3): 40 mg via SUBCUTANEOUS
  Filled 2017-04-22 (×3): qty 0.4

## 2017-04-22 MED ORDER — SODIUM CHLORIDE 0.9% FLUSH: 10.0000 mL | INTRAVENOUS | Status: DC | PRN

## 2017-04-22 MED ORDER — DIPHENOXYLATE-ATROPINE 2.5-0.025 MG PO TABS
2.0000 | ORAL_TABLET | Freq: Four times a day (QID) | ORAL | Status: DC
  Administered 2017-04-22 – 2017-04-24 (×7): 2 via ORAL
  Filled 2017-04-22 (×7): qty 2

## 2017-04-22 NOTE — Plan of Care (Signed)
Problem: Education: Goal: Knowledge of [facility] Education information/materials will improve Outcome: Progressing Pt knowledge of disease process will improve prior to discharge.   Problem: Skin Integrity: Goal: Risk for impaired skin integrity will decrease Outcome: Progressing Pt will be repositioned q 2 hrs to prevent pressure injuries

## 2017-04-22 NOTE — Progress Notes (Signed)
PROGRESS NOTE    Kathleen Valenzuela  ION:[id number] DOB: [birth year] DOA: 04/10/2017 PCP: Patient, No Pcp Per   Brief Narrative: 66 y.o. female with PMH of bipolar disorder, early dementia on Aricept, COPD, hypertension, recent hospitalization for treatment of acute diverticulitis with abscess status post percutaneous drainage (discharge 04/04/17 on oral Cipro/Flagyl) who was admitted 04/10/17 from her SNF with a chief complaint of diarrhea associated with vomiting. Upon initial evaluation, her potassium was found to be 2.2.  Assessment & Plan:   # Diverticulitis/Diverticular abscess of colon -CT of the abdomen done in the ED showed a smaller intramural diverticular abscess with no evidence of bowel obstruction or recurrent/new inflammatory process.Treated with IV fluids, anti-emetics, and bowel rest, and IV Cipro/Flagyl. C. difficile PCR was negative. Symptoms improved and diet was advanced to soft on 04/12/17. CT repeated 04/16/17 secondary to persistent leukocytosis, and showed persistent sigmoid diverticulitis, and a slightly larger abscess pocket. Case discussed with general surgery and IR on 04/18/17. On 04/19/17, antibiotics broadened to merrem due to persistent fever and leukocytosis. Patient transferred from [facility] for percutaneous drain placement however CT imaging demonstrated a reduction/near resolution of diverticular abscess, so CT-guided aspiration/drain placement was not performed. -advance diet as tolerated -currently on meropenem IV  # Coagulase-negative Staphylococcus bacteremia Blood culture 1 from 04/13/17 was positive for staph. Vancomycin started and repeat blood culture sent 04/15/17. 2-D echo done to rule out endocarditis. Final culture consistent with coagulase-negative staph, felt to be a contaminant, vancomycin subsequently discontinued.  # Acute kidney injury/hypokalemia: started oral KCL TID. Monitor labs. AKI resolved.   # Intractable vomiting/diarrhea -advance diet, symptoms  improving.  # Bipolar disorder (HCC)/Acute encephalopathy -Patient normally takes lithium, trazodone, and Xanax. These have been discontinued secondary to encephalopathy. Was given Narcan on 04/17/17 overnight.  -Patient is more alert awake and requesting pain management for her chronic back pain. Patient takes high doses of oxycodone as home. Continue Xanax as needed for anxiety as well as oxycodone as needed for the pain management.  # Early onset Alzheimer's dementia Aricept resume. Continue supportive care. Likely discharge to skilled facility.  # Acute respiratory failure with hypoxia/COPD (chronic obstructive pulmonary disease) (HCC)/pulmonary edema Chest x-ray 04/19/17 showed pulmonary edema. She was provided with supplemental oxygen and diuresis. Respiratory status currently stable. Continue when necessary bronchodilators.  # Vaginal candidiasis. Status post Diflucan.   # Obesity Body mass index is 34.17 kg/m.    # Aortic atherosclerosis/acute on chronic diastolic CHF 2D echo done 04/15/17. EF 55-60 percent. Grade 1 diastolic dysfunction. Mild LVH. No regional wall motion abnormalities. I/O balance -1.4 L.  DVT prophylaxis: Order Lovenox subcutaneous Code Status: Full code Family Communication: No family at bedside Disposition Plan: PT OT evaluation. Social worker consult for skilled nursing home discharge  Consultants:   Interventional Radiology  General Surgery  Antimicrobials:  Cipro 8/7/---> 04/19/17  Flagyl 04/11/17---> 04/19/17  Meropenem 04/19/17---> Subjective: Patient was seen and examined at bedside. Patient reported anxiety and chronic back pain and asking for pain medication. Denied nausea vomiting or abdominal pain.  Objective: Vitals:   04/21/17 0915 04/21/17 1144 04/21/17 1534 04/22/17 0500  BP:   106/70   Pulse: 92 87    Resp:      Temp:  98.1 F (36.7 C) 97.9 F (36.6 C)   TempSrc:  Oral Oral   SpO2: 99% 100%    Weight:    90.2 kg (198 lb  12.8 oz)  Height:        Intake/Output Summary (Last  24 hours) at 04/22/17 1310 Last data filed at 04/22/17 0854  Gross per 24 hour  Intake              110 ml  Output              700 ml  Net             -590 ml   Filed Weights   04/20/17 1728 04/21/17 0613 04/22/17 0500  Weight: 82.7 kg (182 lb 4.8 oz) 84.7 kg (186 lb 12.8 oz) 90.2 kg (198 lb 12.8 oz)    Examination:  General exam: Frail elderly female lying on bed Respiratory system: Clear to auscultation. Respiratory effort normal. No wheezing or crackle Cardiovascular system: S1 & S2 heard, RRR.  No pedal edema. Gastrointestinal system: Abdomen is nondistended, soft and nontender. Normal bowel sounds heard. Central nervous system: Alert awake and following commands. Skin: No rashes, lesions or ulcers Psychiatry: Judgement and insight appear age-appropriate.    Data Reviewed: I have personally reviewed following labs and imaging studies  CBC:  Recent Labs Lab 04/17/17 0515 04/18/17 0520 04/20/17 0500 04/21/17 0450 04/22/17 1140  WBC 14.3* 16.3* 23.3* 15.9* 11.9*  NEUTROABS 10.0*  --   --   --   --   HGB 11.9* 12.2 14.7 13.3 12.2  HCT 36.2 36.9 44.8 39.9 36.9  MCV 94.0 92.7 92.2 90.7 90.4  PLT 226 214 239 234 254   Basic Metabolic Panel:  Recent Labs Lab 04/17/17 0515 04/19/17 0551 04/20/17 0500 04/21/17 0450 04/22/17 1140  NA 140 141 142 142 135  K 3.8 3.6 2.9* 2.7* 3.3*  CL 107 108 106 106 105  CO2 27 22 24 26 23   GLUCOSE 129* 142* 147* 131* 127*  BUN 5* 11 16 23* 22*  CREATININE 0.66 0.75 0.96 1.08* 0.80  CALCIUM 9.4 9.9 10.2 9.2 8.8*  MG  --   --  2.2  --  2.0   GFR: Estimated Creatinine Clearance: 73.2 mL/min (by C-G formula based on SCr of 0.8 mg/dL). Liver Function Tests:  Recent Labs Lab 04/17/17 0515  AST 18  ALT 7*  ALKPHOS 51  BILITOT 0.5  PROT 6.5  ALBUMIN 2.9*   No results for input(s): LIPASE, AMYLASE in the last 168 hours. No results for input(s): AMMONIA in the last  168 hours. Coagulation Profile:  Recent Labs Lab 04/20/17 1426  INR 1.31   Cardiac Enzymes: No results for input(s): CKTOTAL, CKMB, CKMBINDEX, TROPONINI in the last 168 hours. BNP (last 3 results) No results for input(s): PROBNP in the last 8760 hours. HbA1C: No results for input(s): HGBA1C in the last 72 hours. CBG:  Recent Labs Lab 04/17/17 2103  GLUCAP 131*   Lipid Profile: No results for input(s): CHOL, HDL, LDLCALC, TRIG, CHOLHDL, LDLDIRECT in the last 72 hours. Thyroid Function Tests: No results for input(s): TSH, T4TOTAL, FREET4, T3FREE, THYROIDAB in the last 72 hours. Anemia Panel: No results for input(s): VITAMINB12, FOLATE, FERRITIN, TIBC, IRON, RETICCTPCT in the last 72 hours. Sepsis Labs: No results for input(s): PROCALCITON, LATICACIDVEN in the last 168 hours.  Recent Results (from the past 240 hour(s))  Culture, blood (Routine X 2) w Reflex to ID Panel     Status: Abnormal   Collection Time: 04/13/17  3:19 PM  Result Value Ref Range Status   Specimen Description BLOOD BLOOD RIGHT [place]  Final   Special Requests   Final    BOTTLES DRAWN AEROBIC AND ANAEROBIC Blood Culture adequate volume  Culture  Setup Time   Final    GRAM POSITIVE COCCI IN CLUSTERS Gram Stain Report Called to,Read Back By and Verified With: WATKINS,T AT 0930 BY HUFFINES,S ON 04/14/17. AEROBIC BOTTLE ONLY Organism ID to follow CRITICAL RESULT CALLED TO, READ BACK BY AND VERIFIED WITH: S. Hall Pharm.D. 14:55 04/14/17 ([email or web address])    Culture (A)  Final    STAPHYLOCOCCUS SPECIES (COAGULASE NEGATIVE) THE SIGNIFICANCE OF ISOLATING THIS ORGANISM FROM A SINGLE SET OF BLOOD CULTURES WHEN MULTIPLE SETS ARE DRAWN IS UNCERTAIN. PLEASE NOTIFY THE MICROBIOLOGY DEPARTMENT WITHIN ONE WEEK IF SPECIATION AND SENSITIVITIES ARE REQUIRED. Performed at [facility], [place] [address], [place], [place] [address]    Report Status 04/15/2017 FINAL  Final  Blood Culture ID Panel (Reflexed)     Status: Abnormal    Collection Time: 04/13/17  3:19 PM  Result Value Ref Range Status   Enterococcus species NOT DETECTED NOT DETECTED Final   Vancomycin resistance NOT DETECTED NOT DETECTED Final   Listeria monocytogenes NOT DETECTED NOT DETECTED Final   Staphylococcus species DETECTED (A) NOT DETECTED Final    Comment: Methicillin (oxacillin) resistant coagulase negative staphylococcus. Possible blood culture contaminant (unless isolated from more than one blood culture draw or clinical case suggests pathogenicity). No antibiotic treatment is indicated for blood  culture contaminants. CRITICAL RESULT CALLED TO, READ BACK BY AND VERIFIED WITH: S. Hall Pharm.D. 14:55 04/14/17 ([email or web address])    Staphylococcus aureus NOT DETECTED NOT DETECTED Final   Methicillin resistance DETECTED (A) NOT DETECTED Final    Comment: CRITICAL RESULT CALLED TO, READ BACK BY AND VERIFIED WITH: S. Hall Pharm.D. 14:55 04/14/17 ([email or web address])    Streptococcus species NOT DETECTED NOT DETECTED Final   Streptococcus agalactiae NOT DETECTED NOT DETECTED Final   Streptococcus pneumoniae NOT DETECTED NOT DETECTED Final   Streptococcus pyogenes NOT DETECTED NOT DETECTED Final   Acinetobacter baumannii NOT DETECTED NOT DETECTED Final   Enterobacteriaceae species NOT DETECTED NOT DETECTED Final   Enterobacter cloacae complex NOT DETECTED NOT DETECTED Final   Escherichia coli NOT DETECTED NOT DETECTED Final   Klebsiella oxytoca NOT DETECTED NOT DETECTED Final   Klebsiella pneumoniae NOT DETECTED NOT DETECTED Final   Proteus species NOT DETECTED NOT DETECTED Final   Serratia marcescens NOT DETECTED NOT DETECTED Final   Carbapenem resistance NOT DETECTED NOT DETECTED Final   Haemophilus influenzae NOT DETECTED NOT DETECTED Final   Neisseria meningitidis NOT DETECTED NOT DETECTED Final   Pseudomonas aeruginosa NOT DETECTED NOT DETECTED Final   Candida albicans NOT DETECTED NOT DETECTED Final   Candida glabrata NOT DETECTED NOT DETECTED Final    Candida krusei NOT DETECTED NOT DETECTED Final   Candida parapsilosis NOT DETECTED NOT DETECTED Final   Candida tropicalis NOT DETECTED NOT DETECTED Final    Comment: Performed at [facility], [place] [address] [place],  [address]  Culture, blood (Routine X 2) w Reflex to ID Panel     Status: None   Collection Time: 04/13/17  3:29 PM  Result Value Ref Range Status   Specimen Description BLOOD BLOOD RIGHT [place]  Final   Special Requests   Final    BOTTLES DRAWN AEROBIC AND ANAEROBIC Blood Culture adequate volume   Culture NO GROWTH 5 DAYS  Final   Report Status 04/18/2017 FINAL  Final  Culture, Urine     Status: None   Collection Time: 04/14/17  6:59 AM  Result Value Ref Range Status   Specimen Description URINE, CLEAN CATCH  Final  Special Requests NONE  Final   Culture   Final    NO GROWTH Performed at [facility], [place] [address], [place], [place] [address]    Report Status 04/16/2017 FINAL  Final  Culture, blood (Routine X 2) w Reflex to ID Panel     Status: None   Collection Time: 04/15/17  5:13 PM  Result Value Ref Range Status   Specimen Description BLOOD RIGHT [place]  Final   Special Requests   Final    Blood Culture adequate volume BOTTLES DRAWN AEROBIC ONLY   Culture NO GROWTH 5 DAYS  Final   Report Status 04/20/2017 FINAL  Final  Culture, blood (Routine X 2) w Reflex to ID Panel     Status: None   Collection Time: 04/15/17  5:27 PM  Result Value Ref Range Status   Specimen Description A-LINE  Final   Special Requests   Final    BOTTLES DRAWN AEROBIC AND ANAEROBIC Blood Culture adequate volume DRAWN BY RN   Culture NO GROWTH 5 DAYS  Final   Report Status 04/20/2017 FINAL  Final  Culture, Urine     Status: None   Collection Time: 04/19/17  2:36 PM  Result Value Ref Range Status   Specimen Description URINE, RANDOM  Final   Special Requests NONE  Final   Culture   Final    NO GROWTH Performed at [facility], [place] [address], [place],  [place] [address]    Report Status 04/21/2017 FINAL  Final         Radiology Studies: Ct Pelvis Wo Contrast  Result Date: 04/20/2017 CLINICAL DATA:  History of diverticular abscess, post percutaneous drainage catheter placement on 03/31/2017 Unfortunately, the drainage catheter was removed prior to percutaneous drainage catheter injection and patient 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 As such, request has been made for repeat CT-guided aspiration/drainage catheter placement. EXAM: CT PELVIS WITHOUT CONTRAST LIMITED TECHNIQUE: Multidetector CT imaging of the abdomen was performed following the standard protocol without IV contrast. COMPARISON:  CT abdomen pelvis - 04/16/2017; 04/10/2017; 03/25/2017; CT-guided left trans gluteal approach percutaneous drainage catheter placement - 03/31/2017 FINDINGS: Patient was positioned prone on the CT gantry. Noncontrast images were obtained of the lower pelvis and demonstrated reduction in size of pericolonic diverticular abscess with potential tiny residual intramural component measuring only 1.9 x 1.2 cm (image 35, series 2), previously, 2.6 x 2.0 cm. Given reduction/near resolution of the diverticular abscess, CT-guided aspiration/drainage catheter placement was not attempted. IMPRESSION: Interval reduction / near resolution of diverticular abscess with potential tiny intramural component measuring approximately 1.9 cm, previously, 2.6 cm. As such, attempted CT-guided aspiration / drainage catheter placement was not attempted. Electronically Signed   By: Sandi Mariscal M.D.   On: 04/20/2017 15:52        Scheduled Meds: . feeding supplement  1 Container Oral TID BM  . isosorbide mononitrate  15 mg Oral Q breakfast  . mouth rinse  15 mL Mouth Rinse BID  . metoprolol succinate  25 mg Oral Q breakfast  . potassium chloride  40 mEq Oral TID   Continuous Infusions: . meropenem (MERREM) IV Stopped (04/22/17 0649)     LOS: 11 days     Roniya Tetro Tanna Furry, MD [facility] Pager [phone]  If 7PM-7AM, please contact night-coverage [email or web address] Password [email or web address] 04/22/2017, 1:10 PM

## 2017-04-23 DIAGNOSIS — N390 Urinary tract infection, site not specified: Secondary | ICD-10-CM

## 2017-04-23 LAB — BASIC METABOLIC PANEL
ANION GAP: 7 (ref 5–15)
BUN: 12 mg/dL (ref 6–20)
CHLORIDE: 107 mmol/L (ref 101–111)
CO2: 22 mmol/L (ref 22–32)
Calcium: 8.8 mg/dL — ABNORMAL LOW (ref 8.9–10.3)
Creatinine, Ser: 0.72 mg/dL (ref 0.44–1.00)
GFR calc Af Amer: >60 mL/min (ref >60)
GFR calc non Af Amer: >60 mL/min (ref >60)
GLUCOSE: 125 mg/dL — AB (ref 65–99)
POTASSIUM: 3.7 mmol/L (ref 3.5–5.1)
Sodium: 136 mmol/L (ref 135–145)

## 2017-04-23 LAB — CBC
HEMATOCRIT: 37.3 % (ref 36.0–46.0)
HEMOGLOBIN: 12.2 g/dL (ref 12.0–15.0)
MCH: 29.9 pg (ref 26.0–34.0)
MCHC: 32.7 g/dL (ref 30.0–36.0)
MCV: 91.4 fL (ref 78.0–100.0)
PLATELETS: 225 10*3/uL (ref 150–400)
RBC: 4.08 MIL/uL (ref 3.87–5.11)
RDW: 15 % (ref 11.5–15.5)
WBC: 10.4 10*3/uL (ref 4.0–10.5)

## 2017-04-23 LAB — MAGNESIUM: Magnesium: 1.9 mg/dL (ref 1.7–2.4)

## 2017-04-23 MED ORDER — POTASSIUM CHLORIDE 20 MEQ PO PACK
40.0000 meq | PACK | Freq: Three times a day (TID) | ORAL | Status: DC
  Administered 2017-04-23 (×2): 40 meq via ORAL
  Filled 2017-04-23 (×4): qty 2

## 2017-04-23 MED ORDER — POTASSIUM CHLORIDE 20 MEQ PO PACK
40.0000 meq | PACK | Freq: Two times a day (BID) | ORAL | Status: DC
  Administered 2017-04-24: 40 meq via ORAL
  Filled 2017-04-23 (×2): qty 2

## 2017-04-23 NOTE — Progress Notes (Addendum)
PROGRESS NOTE    Kathleen Valenzuela  XBJ:[id number] DOB: Nov 22, 1950 DOA: 04/10/2017 PCP: Patient, No Pcp Per   Brief Narrative: 66 y.o. female with PMH of bipolar disorder, early dementia on Aricept, COPD, hypertension, recent hospitalization for treatment of acute diverticulitis with abscess status post percutaneous drainage (discharge 04/04/17 on oral Cipro/Flagyl) who was admitted 04/10/17 from her SNF with a chief complaint of diarrhea associated with vomiting. Upon initial evaluation, her potassium was found to be 2.2.  Assessment & Plan:   # Diverticulitis/Diverticular abscess of colon -CT of the abdomen done in the ED showed a smaller intramural diverticular abscess with no evidence of bowel obstruction or recurrent/new inflammatory process.Treated with IV fluids, anti-emetics, and bowel rest, and IV Cipro/Flagyl. C. difficile PCR was negative. Symptoms improved and diet was advanced to soft on 04/12/17. CT repeated 04/16/17 secondary to persistent leukocytosis, and showed persistent sigmoid diverticulitis, and a slightly larger abscess pocket. Case discussed with general surgery and IR on 04/18/17. On 04/19/17, antibiotics broadened to merrem due to persistent fever and leukocytosis. Patient transferred from [facility] for percutaneous drain placement however CT imaging demonstrated a reduction/near resolution of diverticular abscess, so CT-guided aspiration/drain placement was not performed. -advance diet as tolerated -currently on meropenem IV, likely dc IV abx tomorrow.  # Coagulase-negative Staphylococcus bacteremia Blood culture 1 from 04/13/17 was positive for staph. Vancomycin started and repeat blood culture sent 04/15/17. 2-D echo done to rule out endocarditis. Final culture consistent with coagulase-negative staph, felt to be a contaminant, vancomycin subsequently discontinued.  # Acute kidney injury/hypokalemia: started oral KCL TID. Monitor labs. AKI resolved.   # Intractable  vomiting/diarrhea -advance diet, symptoms improving.  # Bipolar disorder (HCC)/Acute encephalopathy -Patient normally takes lithium, trazodone, and Xanax. These have been discontinued secondary to encephalopathy. Was given Narcan on 04/17/17 overnight.  -Patient looks more comfortable today. Continue current management and pain occasions.  # Early onset Alzheimer's dementia Aricept resume. Continue supportive care. Family declining a skilled facility. Likely discharge home with home care services.  # Acute respiratory failure with hypoxia/COPD (chronic obstructive pulmonary disease) (HCC)/pulmonary edema Chest x-ray 04/19/17 showed pulmonary edema. She was provided with supplemental oxygen and diuresis. Respiratory status currently stable. Continue when necessary bronchodilators. Plan to wean oxygen to room air today.  # Vaginal candidiasis. Status post Diflucan.   # Obesity Body mass index is 34.17 kg/m.    # Aortic atherosclerosis/acute on chronic diastolic CHF 2D echo done 04/15/17. EF 55-60 percent. Grade 1 diastolic dysfunction. Mild LVH. No regional wall motion abnormalities. I/O balance -1.4 L.  #Diarrhea: C. difficile negative. Resume home medication of lomotil. Abdomen exam benign.  #Hypokalemia: Continue potassium repletion. Labs improving.  DVT prophylaxis: Order Lovenox subcutaneous Code Status: Full code Family Communication: No family at bedside Disposition Plan: PT OT evaluation.   Consultants:   Interventional Radiology  General Surgery  Antimicrobials:  Cipro 8/7/---> 04/19/17  Flagyl 04/11/17---> 04/19/17  Meropenem 04/19/17---> Subjective: Patient was seen and examined at bedside. Feels better today. Still having weakness and requiring some oxygen. Denied headache, dizziness, chest pain or shortness of breath. Objective: Vitals:   04/21/17 1534 04/22/17 0500 04/23/17 0627 04/23/17 1404  BP: 106/70  (!) 116/59 (!) 109/58  Pulse:   78 75  Resp:    18    Temp: 97.9 F (36.6 C)  97.9 F (36.6 C) 98.1 F (36.7 C)  TempSrc: Oral  Oral Oral  SpO2:   99% 98%  Weight:  90.2 kg (198 lb 12.8 oz) 90.4 kg (  199 lb 6.4 oz)   Height:        Intake/Output Summary (Last 24 hours) at 04/23/17 1639 Last data filed at 04/23/17 1506  Gross per 24 hour  Intake              462 ml  Output             1500 ml  Net            -1038 ml   Filed Weights   04/21/17 0613 04/22/17 0500 04/23/17 9833  Weight: 84.7 kg (186 lb 12.8 oz) 90.2 kg (198 lb 12.8 oz) 90.4 kg (199 lb 6.4 oz)    Examination:  General exam: Not in distress Respiratory system: Clear bilateral. Respiratory effort normal. No wheezing or crackle Cardiovascular system: Regular rate and rhythm, S1-S2 normal Gastrointestinal system: Abdomen soft, nontender, nondistended.Bowel sound positive. Central nervous system: Alert awake and following commands. Skin: No rashes, lesions or ulcers Psychiatry: Judgement and insight appear age-appropriate.    Data Reviewed: I have personally reviewed following labs and imaging studies  CBC:  Recent Labs Lab 04/17/17 0515 04/18/17 0520 04/20/17 0500 04/21/17 0450 04/22/17 1140 04/23/17 1045  WBC 14.3* 16.3* 23.3* 15.9* 11.9* 10.4  NEUTROABS 10.0*  --   --   --   --   --   HGB 11.9* 12.2 14.7 13.3 12.2 12.2  HCT 36.2 36.9 44.8 39.9 36.9 37.3  MCV 94.0 92.7 92.2 90.7 90.4 91.4  PLT 226 214 239 234 224 825   Basic Metabolic Panel:  Recent Labs Lab 04/19/17 0551 04/20/17 0500 04/21/17 0450 04/22/17 1140 04/23/17 1045  NA 141 142 142 135 136  K 3.6 2.9* 2.7* 3.3* 3.7  CL 108 106 106 105 107  CO2 22 24 26 23 22   GLUCOSE 142* 147* 131* 127* 125*  BUN 11 16 23* 22* 12  CREATININE 0.75 0.96 1.08* 0.80 0.72  CALCIUM 9.9 10.2 9.2 8.8* 8.8*  MG  --  2.2  --  2.0 1.9   GFR: Estimated Creatinine Clearance: 73.3 mL/min (by C-G formula based on SCr of 0.72 mg/dL). Liver Function Tests:  Recent Labs Lab 04/17/17 0515  AST 18  ALT 7*   ALKPHOS 51  BILITOT 0.5  PROT 6.5  ALBUMIN 2.9*   No results for input(s): LIPASE, AMYLASE in the last 168 hours. No results for input(s): AMMONIA in the last 168 hours. Coagulation Profile:  Recent Labs Lab 04/20/17 1426  INR 1.31   Cardiac Enzymes: No results for input(s): CKTOTAL, CKMB, CKMBINDEX, TROPONINI in the last 168 hours. BNP (last 3 results) No results for input(s): PROBNP in the last 8760 hours. HbA1C: No results for input(s): HGBA1C in the last 72 hours. CBG:  Recent Labs Lab 04/17/17 2103  GLUCAP 131*   Lipid Profile: No results for input(s): CHOL, HDL, LDLCALC, TRIG, CHOLHDL, LDLDIRECT in the last 72 hours. Thyroid Function Tests: No results for input(s): TSH, T4TOTAL, FREET4, T3FREE, THYROIDAB in the last 72 hours. Anemia Panel: No results for input(s): VITAMINB12, FOLATE, FERRITIN, TIBC, IRON, RETICCTPCT in the last 72 hours. Sepsis Labs: No results for input(s): PROCALCITON, LATICACIDVEN in the last 168 hours.  Recent Results (from the past 240 hour(s))  Culture, Urine     Status: None   Collection Time: 04/14/17  6:59 AM  Result Value Ref Range Status   Specimen Description URINE, CLEAN CATCH  Final   Special Requests NONE  Final   Culture   Final    NO  GROWTH Performed at [facility], [place] [address], [place], [place] [address]    Report Status 04/16/2017 FINAL  Final  Culture, blood (Routine X 2) w Reflex to ID Panel     Status: None   Collection Time: 04/15/17  5:13 PM  Result Value Ref Range Status   Specimen Description BLOOD RIGHT [place]  Final   Special Requests   Final    Blood Culture adequate volume BOTTLES DRAWN AEROBIC ONLY   Culture NO GROWTH 5 DAYS  Final   Report Status 04/20/2017 FINAL  Final  Culture, blood (Routine X 2) w Reflex to ID Panel     Status: None   Collection Time: 04/15/17  5:27 PM  Result Value Ref Range Status   Specimen Description A-LINE  Final   Special Requests   Final    BOTTLES DRAWN AEROBIC  AND ANAEROBIC Blood Culture adequate volume DRAWN BY RN   Culture NO GROWTH 5 DAYS  Final   Report Status 04/20/2017 FINAL  Final  Culture, Urine     Status: None   Collection Time: 04/19/17  2:36 PM  Result Value Ref Range Status   Specimen Description URINE, RANDOM  Final   Special Requests NONE  Final   Culture   Final    NO GROWTH Performed at [facility], [place] [address], [place], [place] [address]    Report Status 04/21/2017 FINAL  Final  C difficile quick scan w PCR reflex     Status: None   Collection Time: 04/22/17  1:53 PM  Result Value Ref Range Status   C Diff antigen NEGATIVE NEGATIVE Final   C Diff toxin NEGATIVE NEGATIVE Final   C Diff interpretation No C. difficile detected.  Final         Radiology Studies: No results found.      Scheduled Meds: . diphenoxylate-atropine  2 tablet Oral QID  . donepezil  5 mg Oral QHS  . enoxaparin (LOVENOX) injection  40 mg Subcutaneous Q24H  . feeding supplement  1 Container Oral TID BM  . isosorbide mononitrate  15 mg Oral Q breakfast  . mouth rinse  15 mL Mouth Rinse BID  . metoprolol succinate  25 mg Oral Q breakfast  . potassium chloride  40 mEq Oral TID   Continuous Infusions: . meropenem (MERREM) IV Stopped (04/23/17 1554)     LOS: 12 days    Betta Balla Tanna Furry, MD [facility] Pager [phone]  If 7PM-7AM, please contact night-coverage [email or web address] Password [facility] 04/23/2017, 4:39 PM

## 2017-04-23 NOTE — Evaluation (Signed)
Occupational Therapy Evaluation Patient Details Name: Kathleen Valenzuela MRN: [id number] DOB: 05-12-1951 Today's Date: 04/23/2017    History of Present Illness 66 yo with diverticular abscess transferred to [facility] 8/16 for drainage of abscess cancelled periprocedure. PMHx: leukocytosis, COPD, HTN, diverticular abscess, bipolar, dementia   Clinical Impression   Pt reports she was managing BADL with mod I PTA. Currently pt requires min assist for stand pivot transfers and min guard-min assist for ADL. Pt presenting with pain, deconditioning, decreased activity tolerance, and generalized weakness impacting her independence and safety with ADL and functional mobility. Pt planning to return to ALF upon d/c (per chart, family refusing SNF). Recommending HHOT for follow up to maximize independence and safety with ADL and functional mobility. Pt would benefit from continued skilled OT to address established goals.    Follow Up Recommendations  Home health OT;Supervision/Assistance - 24 hour (family refusing SNF)    Equipment Recommendations  None recommended by OT    Recommendations for Other Services       Precautions / Restrictions Precautions Precautions: Fall Restrictions Weight Bearing Restrictions: No      Mobility Bed Mobility Overal bed mobility: Needs Assistance Bed Mobility: Supine to Sit     Supine to sit: Min assist     General bed mobility comments: Min HHA for trunk elevation from supine to sit  Transfers Overall transfer level: Needs assistance Equipment used: Rolling walker (2 wheeled) Transfers: Sit to/from [facility] Sit to Stand: Min assist Stand pivot transfers: Min assist       General transfer comment: Min assist for balance, fatigues quickly    Balance Overall balance assessment: Needs assistance Sitting-balance support: Feet unsupported;No upper extremity supported Sitting balance-Leahy Scale: Good     Standing balance support: No upper  extremity supported Standing balance-Leahy Scale: Poor Standing balance comment: RW for support                           ADL either performed or assessed with clinical judgement   ADL Overall ADL's : Needs assistance/impaired Eating/Feeding: Set up;Sitting   Grooming: Min guard;Sitting   Upper Body Bathing: Min guard;Sitting   Lower Body Bathing: Minimal assistance;Sit to/from stand   Upper Body Dressing : Min guard;Sitting   Lower Body Dressing: Minimal assistance;Sit to/from stand Lower Body Dressing Details (indicate cue type and reason): Pt able to don socks with min guard sitting EOB. Min assist for standing balance Toilet Transfer: Minimal assistance;Stand-pivot;RW Toilet Transfer Details (indicate cue type and reason): Simulated by stand pivot EOB>chair         Functional mobility during ADLs: Minimal assistance;Rolling walker       Vision         Perception     Praxis      Pertinent Vitals/Pain Pain Assessment: Faces Faces Pain Scale: Hurts little more Pain Location: back Pain Descriptors / Indicators: Aching;Sore Pain Intervention(s): Limited activity within patient's tolerance;Monitored during session;Repositioned     [place] Dominance Right   Extremity/Trunk Assessment Upper Extremity Assessment Upper Extremity Assessment: Generalized weakness   Lower Extremity Assessment Lower Extremity Assessment: Defer to PT evaluation       Communication Communication Communication: No difficulties   Cognition Arousal/Alertness: Awake/alert Behavior During Therapy: WFL for tasks assessed/performed Overall Cognitive Status: No family/caregiver present to determine baseline cognitive functioning  General Comments       Exercises     Shoulder Instructions      Home Living Family/patient expects to be discharged to:: Assisted living                                         Prior Functioning/Environment Level of Independence: Independent        Comments: pt reports she was independent with mobility and mod I with ADL PTA. Took meals in dining room        OT Problem List: Decreased strength;Decreased activity tolerance;Impaired balance (sitting and/or standing);Decreased knowledge of use of DME or AE;Pain      OT Treatment/Interventions: Self-care/ADL training;Therapeutic exercise;Energy conservation;DME and/or AE instruction;Therapeutic activities;Patient/family education;Balance training    OT Goals(Current goals can be found in the care plan section) Acute Rehab OT Goals Patient Stated Goal: get better and back to PLOF OT Goal Formulation: With patient Time For Goal Achievement: 05/07/17 Potential to Achieve Goals: Good ADL Goals Pt Will Perform Grooming: with min guard assist;standing Pt Will Perform Upper Body Bathing: with set-up;sitting Pt Will Perform Lower Body Bathing: with min guard assist;sit to/from stand Pt Will Transfer to Toilet: with min guard assist;ambulating;bedside commode Pt Will Perform Toileting - Clothing Manipulation and hygiene: with min guard assist;sit to/from stand  OT Frequency: Min 2X/week   Barriers to D/C:            Co-evaluation              AM-PAC PT "6 Clicks" Daily Activity     Outcome Measure Help from another person eating meals?: None Help from another person taking care of personal grooming?: A Little Help from another person toileting, which includes using toliet, bedpan, or urinal?: A Little Help from another person bathing (including washing, rinsing, drying)?: A Little Help from another person to put on and taking off regular upper body clothing?: A Little Help from another person to put on and taking off regular lower body clothing?: A Little 6 Click Score: 19   End of Session Equipment Utilized During Treatment: Rolling walker;Oxygen  Activity Tolerance: Patient tolerated treatment  well Patient left: in chair;with call bell/phone within reach;with chair alarm set  OT Visit Diagnosis: Unsteadiness on feet (R26.81);Other abnormalities of gait and mobility (R26.89);Muscle weakness (generalized) (M62.81);Pain Pain - part of body:  (back)                Time: 7893-8101 OT Time Calculation (min): 21 min Charges:  OT General Charges $OT Visit: 1 Procedure OT Evaluation $OT Eval Moderate Complexity: 1 Procedure G-Codes:     Maryl Blalock A. Ulice Brilliant, M.S., OTR/L Pager: [place] 04/23/2017, 3:34 PM

## 2017-04-24 DIAGNOSIS — F319 Bipolar disorder, unspecified: Secondary | ICD-10-CM

## 2017-04-24 MED ORDER — HEPARIN SOD (PORK) LOCK FLUSH 100 UNIT/ML IV SOLN
250.0000 [IU] | INTRAVENOUS | Status: AC | PRN
  Administered 2017-04-24: 250 [IU]

## 2017-04-24 MED ORDER — ALPRAZOLAM 1 MG PO TABS: 0.5000 mg | ORAL_TABLET | Freq: Three times a day (TID) | ORAL | 0 refills | Status: DC | PRN

## 2017-04-24 MED ORDER — OXYCODONE HCL 10 MG PO TABS: 10.0000 mg | ORAL_TABLET | Freq: Three times a day (TID) | ORAL | 0 refills | Status: DC | PRN

## 2017-04-24 MED ORDER — POTASSIUM CHLORIDE CRYS ER 10 MEQ PO TBCR: 20.0000 meq | EXTENDED_RELEASE_TABLET | Freq: Every day | ORAL | 0 refills | Status: DC

## 2017-04-24 NOTE — Discharge Summary (Addendum)
Physician Discharge Summary  Kathleen Valenzuela VHQ:[id number] DOB: May 22, 1951 DOA: 04/10/2017  PCP: Patient, No Pcp Per  Admit date: 04/10/2017 Discharge date: 04/24/2017  Admitted From:ALF Disposition:ALF  Recommendations for Outpatient Follow-up:  1. Follow up with PCP in 1-2 weeks 2. Please obtain BMP/CBC in one week  Home Health:yes Equipment/Devices:no Discharge Condition:stable CODE STATUS:full code Diet recommendation:heart healthy  Brief/Interim Summary: 66 y.o.femalewith PMH of bipolar disorder, early dementia on Aricept, COPD, hypertension, recent hospitalization for treatment of acute diverticulitis with abscess status post percutaneous drainage (discharge 04/04/17 on oral Cipro/Flagyl) who was admitted 04/10/17 from her SNF with a chief complaint of diarrhea associated with vomiting. Upon initial evaluation, her potassium was found to be 2.2.  # Diverticulitis/Diverticular abscessof colon -CT of the abdomen done in the ED showed a smaller intramural diverticular abscess with no evidence of bowel obstruction or recurrent/new inflammatory process.Treated with IV fluids, anti-emetics, and bowel rest, and IV Cipro/Flagyl. C. difficile PCR was negative. Symptoms improved and diet was advanced to soft on 04/12/17. CT repeated 04/16/17 secondary to persistent leukocytosis, and showed persistent sigmoid diverticulitis, and a slightly larger abscess pocket. Case discussed with general surgery and IR on 04/18/17. On 04/19/17, antibiotics broadened to merrem due to persistent fever and leukocytosis. Patient transferred from [facility] for percutaneous drain placement however CT imaging demonstrated a reduction/near resolution of diverticular abscess, so CT-guided aspiration/drainplacement was not performed. -Patient completed antibiotics course in the hospital. Clinically improved. Able to tolerate diet well. No nausea vomiting or abdominal pain.  Cipro 8/7/--->04/19/17  Flagyl  04/11/17--->04/19/17  Meropenem 04/19/17---8/20  #Coagulase-negative Staphylococcusbacteremia Blood culture 1 from 04/13/17 was positive for staph. Vancomycin started and repeat blood culture sent 04/15/17. 2-D echo done to rule out endocarditis. Final culture consistent with coagulase-negative staph, felt to be a contaminant, vancomycin subsequently discontinued.  #Acute kidney injury/hypokalemia: Continue oral potassium chloride. Monitor labs. AKI resolved.   #Intractable vomiting/diarrhea -advance diet, symptoms improving.  #Bipolar disorder (HCC)/Acute encephalopathy -Mental status improved. Patient was anxious. Resume home medication. The dose of narcotics and Xanax reduced on discharge.  #Early onset Alzheimer's dementia Resume home medication. He landed by PT would be recommended a skilled facility. Patient and family declined skilled. Patient is going to assisted living facility. Her, to follow-up with PCP.  #Acute respiratory failure with hypoxia/COPD (chronic obstructive pulmonary disease) (HCC)/pulmonary edema Chest x-ray 04/19/17 showed pulmonary edema. She was provided with supplemental oxygen and diuresis. Respiratory status currently stable. No shortness of breath or dyspnea. Oxygen saturation acceptable in room air. Clinically improved.  #Vaginal candidiasis. Status post Diflucan.  #Obesity Body mass index is 34.17 kg/m.  #Aortic atherosclerosis/acute on chronic diastolic CHF 2D echo done 04/15/17. EF 55-60 percent. Grade 1 diastolic dysfunction. Mild LVH. No regional wall motion abnormalities.  #Diarrhea: C. difficile negative. Resume home medication of lomotil. Abdomen exam benign. No diarrhea today.  #Hypokalemia: Continue potassium repletion. Compared to monitor labs outpatient.  Patient clinically improved. The headache, dizziness, nausea vomiting chest pain or shortness of breath. Wanted to go assisted living facility. Patient and family  declined SNF. Completed antibiotics course. At this time, patient is medically stable to transfer her care to outpatient.  Discharge Diagnoses:  Principal Problem:   Diverticulitis of colon Active Problems:   Bipolar disorder ([place])   COPD (chronic obstructive pulmonary disease) ([place])   Colonic diverticular abscess   Vaginal candidiasis   Acute respiratory failure with hypoxia (HCC)   Pulmonary edema   Obesity (BMI 30-39.9)   Aortic atherosclerosis (HCC)   Acute on  chronic diastolic CHF (congestive heart failure) ([place])   MRSA bacteremia    Discharge Instructions  Discharge Instructions    Call MD for:  difficulty breathing, headache or visual disturbances    Complete by:  As directed    Call MD for:  extreme fatigue    Complete by:  As directed    Call MD for:  hives    Complete by:  As directed    Call MD for:  persistant dizziness or light-headedness    Complete by:  As directed    Call MD for:  persistant nausea and vomiting    Complete by:  As directed    Call MD for:  severe uncontrolled pain    Complete by:  As directed    Call MD for:  temperature >100.4    Complete by:  As directed    Care order/instruction    Complete by:  As directed    Can discontinue contact precaution on discharge. In assisted living facility (ALF) follow up facility's own protocol.   Diet - low sodium heart healthy    Complete by:  As directed    Increase activity slowly    Complete by:  As directed      Allergies as of 04/24/2017      Reactions   Penicillins Rash   Has patient had a PCN reaction causing immediate rash, facial/tongue/throat swelling, SOB or lightheadedness with hypotension: Yes Has patient had a PCN reaction causing severe rash involving mucus membranes or skin necrosis: No Has patient had a PCN reaction that required hospitalization: No Has patient had a PCN reaction occurring within the last 10 years: No If all of the above answers are "NO", then may proceed with  Cephalosporin use.      Medication List    STOP taking these medications   ciprofloxacin 500 MG tablet Commonly known as:  CIPRO   metroNIDAZOLE 500 MG tablet Commonly known as:  FLAGYL   polyethylene glycol packet Commonly known as:  MIRALAX / GLYCOLAX     TAKE these medications   albuterol 108 (90 Base) MCG/ACT inhaler Commonly known as:  PROVENTIL HFA;VENTOLIN HFA Inhale 2 puffs into the lungs 4 (four) times daily. (0800, 1200, 1600, & 2000)   ALPRAZolam 1 MG tablet Commonly known as:  XANAX Take 0.5 tablets (0.5 mg total) by mouth 3 (three) times daily as needed for anxiety. (0800, 1400, & 2000) What changed:  how much to take  when to take this  reasons to take this   atorvastatin 10 MG tablet Commonly known as:  LIPITOR Take 10 mg by mouth daily at 8 pm.   BREO ELLIPTA 200-25 MCG/INH Aepb Generic drug:  fluticasone furoate-vilanterol Inhale 1 puff into the lungs daily. (0800)   calcium carbonate 500 MG chewable tablet Commonly known as:  TUMS - dosed in mg elemental calcium Chew 2 tablets by mouth every 2 (two) hours as needed for indigestion (CHEW BEFORE SWALLOWING).   dicyclomine 20 MG tablet Commonly known as:  BENTYL Take 20 mg by mouth 4 (four) times daily. (0800, 1200, 1600, & 2000)   docusate sodium 100 MG capsule Commonly known as:  COLACE Take 100 mg by mouth 2 (two) times daily. (0800 & 2000)   donepezil 10 MG tablet Commonly known as:  ARICEPT Take 10 mg by mouth daily at 8 pm.   Ferrous Gluconate 324 (37.5 Fe) MG Tabs Take 324 mg by mouth 2 (two) times daily. (0800 & 2000)  fluticasone 50 MCG/ACT nasal spray Commonly known as:  FLONASE Place 1 spray into both nostrils daily. (0800)   furosemide 40 MG tablet Commonly known as:  LASIX Take 40 mg by mouth daily. (0800)   isosorbide mononitrate 30 MG 24 hr tablet Commonly known as:  IMDUR Take 30 mg by mouth daily. (0800)   lithium 300 MG tablet Take 300 mg by mouth 2 (two) times  daily. (0800 & 2000)   lithium carbonate 150 MG capsule Take 150 mg by mouth every morning. Take 150 mg capsule along with 300 mg tablet to equal 450 mg.   metoprolol succinate 50 MG 24 hr tablet Commonly known as:  TOPROL-XL Take 50 mg by mouth daily. (0800)Take with or immediately following a meal.   Oxycodone HCl 10 MG Tabs Take 1 tablet (10 mg total) by mouth every 8 (eight) hours as needed (FOR Pain). What changed:  Another medication with the same name was removed. Continue taking this medication, and follow the directions you see here.   pantoprazole 40 MG tablet Commonly known as:  PROTONIX Take 1 tablet (40 mg total) by mouth 2 (two) times daily before a meal.   PATADAY 0.2 % Soln Generic drug:  Olopatadine HCl Place 1 drop into both eyes daily. (0800)   potassium chloride 10 MEQ tablet Commonly known as:  K-DUR,KLOR-CON Take 2 tablets (20 mEq total) by mouth daily. (0800) What changed:  how much to take   RISAMINE 0.44-20.625 % Oint Generic drug:  Menthol-Zinc Oxide Apply 1 application topically 4 (four) times daily as needed (for rash/redness).   tiZANidine 2 MG tablet Commonly known as:  ZANAFLEX Take 2 mg by mouth 2 (two) times daily. (0800 & 2000)   traZODone 50 MG tablet Commonly known as:  DESYREL Take 50 mg by mouth daily at 8 pm.   VOLTAREN 1 % Gel Generic drug:  diclofenac sodium Apply 4 g topically every 12 (twelve) hours as needed. For knee pain       Contact information for follow-up providers    [place] [place] Follow up.   Specialty:  Home Health Services Why:  Home Health RN and Physical Therapy Contact information: [place] [address] [phone]            Contact information for after-discharge care    Destination    [facility] Follow up.   Specialty:  Assisted Living Facility Contact information: [place] [address] [phone]                  Allergies  Allergen Reactions  . Penicillins Rash    Has patient had a PCN reaction causing immediate rash, facial/tongue/throat swelling, SOB or lightheadedness with hypotension: Yes Has patient had a PCN reaction causing severe rash involving mucus membranes or skin necrosis: No Has patient had a PCN reaction that required hospitalization: No Has patient had a PCN reaction occurring within the last 10 years: No If all of the above answers are "NO", then may proceed with Cephalosporin use.     Consultations:  Interventional Radiology  General Surgery  Subjective: Patient was seen and examined at bedside. Reported feeling good. Denied headache, dizziness, nausea vomiting chest pain shortness of breath. Reported anxiety and wanted to resume her home medications.  Discharge Exam: Vitals:   04/24/17 0950 04/24/17 1513  BP:  103/73  Pulse:  96  Resp:  18  Temp:  97.9 F (36.6  C)  SpO2: 99% 98%   Vitals:   04/23/17 2241 04/24/17 0610 04/24/17 0950 04/24/17 1513  BP: 116/65 128/66  103/73  Pulse: 84 89  96  Resp: 18   18  Temp: 98.9 F (37.2 C) 98.2 F (36.8 C)  97.9 F (36.6 C)  TempSrc: Oral Oral  Oral  SpO2: 100% 98% 99% 98%  Weight:  88 kg (194 lb 1.6 oz)    Height:        General: Pt is alert, awake, not in acute distress Cardiovascular: RRR, S1/S2 +, no rubs, no gallops Respiratory: CTA bilaterally, no wheezing, no rhonchi Abdominal: Soft, NT, ND, bowel sounds + Extremities: no edema, no cyanosis    The results of significant diagnostics from this hospitalization (including imaging, microbiology, ancillary and laboratory) are listed below for reference.     Microbiology: Recent Results (from the past 240 hour(s))  Culture, blood (Routine X 2) w Reflex to ID Panel     Status: None   Collection Time: 04/15/17  5:13 PM  Result Value Ref Range Status   Specimen Description BLOOD RIGHT [place]  Final   Special Requests   Final    Blood Culture adequate  volume BOTTLES DRAWN AEROBIC ONLY   Culture NO GROWTH 5 DAYS  Final   Report Status 04/20/2017 FINAL  Final  Culture, blood (Routine X 2) w Reflex to ID Panel     Status: None   Collection Time: 04/15/17  5:27 PM  Result Value Ref Range Status   Specimen Description A-LINE  Final   Special Requests   Final    BOTTLES DRAWN AEROBIC AND ANAEROBIC Blood Culture adequate volume DRAWN BY RN   Culture NO GROWTH 5 DAYS  Final   Report Status 04/20/2017 FINAL  Final  Culture, Urine     Status: None   Collection Time: 04/19/17  2:36 PM  Result Value Ref Range Status   Specimen Description URINE, RANDOM  Final   Special Requests NONE  Final   Culture   Final    NO GROWTH Performed at [facility], [place] [address] [place], [place] [address]    Report Status 04/21/2017 FINAL  Final  C difficile quick scan w PCR reflex     Status: None   Collection Time: 04/22/17  1:53 PM  Result Value Ref Range Status   C Diff antigen NEGATIVE NEGATIVE Final   C Diff toxin NEGATIVE NEGATIVE Final   C Diff interpretation No C. difficile detected.  Final     Labs: BNP (last 3 results) No results for input(s): BNP in the last 8760 hours. Basic Metabolic Panel:  Recent Labs Lab 04/19/17 0551 04/20/17 0500 04/21/17 0450 04/22/17 1140 04/23/17 1045  NA 141 142 142 135 136  K 3.6 2.9* 2.7* 3.3* 3.7  CL 108 106 106 105 107  CO2 22 24 26 23 22   GLUCOSE 142* 147* 131* 127* 125*  BUN 11 16 23* 22* 12  CREATININE 0.75 0.96 1.08* 0.80 0.72  CALCIUM 9.9 10.2 9.2 8.8* 8.8*  MG  --  2.2  --  2.0 1.9   Liver Function Tests: No results for input(s): AST, ALT, ALKPHOS, BILITOT, PROT, ALBUMIN in the last 168 hours. No results for input(s): LIPASE, AMYLASE in the last 168 hours. No results for input(s): AMMONIA in the last 168 hours. CBC:  Recent Labs Lab 04/18/17 0520 04/20/17 0500 04/21/17 0450 04/22/17 1140 04/23/17 1045  WBC 16.3* 23.3* 15.9* 11.9* 10.4  HGB  12.2 14.7 13.3 12.2 12.2  HCT  36.9 44.8 39.9 36.9 37.3  MCV 92.7 92.2 90.7 90.4 91.4  PLT 214 239 234 224 225   Cardiac Enzymes: No results for input(s): CKTOTAL, CKMB, CKMBINDEX, TROPONINI in the last 168 hours. BNP: Invalid input(s): POCBNP CBG:  Recent Labs Lab 04/17/17 2103  GLUCAP 131*   D-Dimer No results for input(s): DDIMER in the last 72 hours. Hgb A1c No results for input(s): HGBA1C in the last 72 hours. Lipid Profile No results for input(s): CHOL, HDL, LDLCALC, TRIG, CHOLHDL, LDLDIRECT in the last 72 hours. Thyroid function studies No results for input(s): TSH, T4TOTAL, T3FREE, THYROIDAB in the last 72 hours.  Invalid input(s): FREET3 Anemia work up No results for input(s): VITAMINB12, FOLATE, FERRITIN, TIBC, IRON, RETICCTPCT in the last 72 hours. Urinalysis    Component Value Date/Time   COLORURINE YELLOW 04/19/2017 1436   APPEARANCEUR CLEAR 04/19/2017 1436   LABSPEC 1.011 04/19/2017 1436   PHURINE 8.0 04/19/2017 1436   GLUCOSEU NEGATIVE 04/19/2017 1436   HGBUR NEGATIVE 04/19/2017 1436   BILIRUBINUR NEGATIVE 04/19/2017 1436   KETONESUR NEGATIVE 04/19/2017 1436   PROTEINUR NEGATIVE 04/19/2017 1436   NITRITE NEGATIVE 04/19/2017 1436   LEUKOCYTESUR NEGATIVE 04/19/2017 1436   Sepsis Labs Invalid input(s): PROCALCITONIN,  WBC,  LACTICIDVEN Microbiology Recent Results (from the past 240 hour(s))  Culture, blood (Routine X 2) w Reflex to ID Panel     Status: None   Collection Time: 04/15/17  5:13 PM  Result Value Ref Range Status   Specimen Description BLOOD RIGHT [place]  Final   Special Requests   Final    Blood Culture adequate volume BOTTLES DRAWN AEROBIC ONLY   Culture NO GROWTH 5 DAYS  Final   Report Status 04/20/2017 FINAL  Final  Culture, blood (Routine X 2) w Reflex to ID Panel     Status: None   Collection Time: 04/15/17  5:27 PM  Result Value Ref Range Status   Specimen Description A-LINE  Final   Special Requests   Final    BOTTLES DRAWN AEROBIC AND ANAEROBIC Blood Culture  adequate volume DRAWN BY RN   Culture NO GROWTH 5 DAYS  Final   Report Status 04/20/2017 FINAL  Final  Culture, Urine     Status: None   Collection Time: 04/19/17  2:36 PM  Result Value Ref Range Status   Specimen Description URINE, RANDOM  Final   Special Requests NONE  Final   Culture   Final    NO GROWTH Performed at [facility], [place] [address], [place], [place] [address]    Report Status 04/21/2017 FINAL  Final  C difficile quick scan w PCR reflex     Status: None   Collection Time: 04/22/17  1:53 PM  Result Value Ref Range Status   C Diff antigen NEGATIVE NEGATIVE Final   C Diff toxin NEGATIVE NEGATIVE Final   C Diff interpretation No C. difficile detected.  Final     Time coordinating discharge: 32 minutes  SIGNED:   Rosita Fire, MD  [facility] 04/24/2017, 4:31 PM  If 7PM-7AM, please contact night-coverage [email or web address] Password [email or web address]

## 2017-04-24 NOTE — Progress Notes (Addendum)
Discharge Planning: AVS reviewed: NCM contacted [facility] rep, Drew to make aware of dc back to [facility] with HHPT/RN, SLP, aide and SW. CSW following for return back to ALF with scheduled dc date today. Jonnie Finner RN CCM Case Mgmt phone [phone]

## 2017-04-24 NOTE — Progress Notes (Signed)
Patient will DC to: [facility] Anticipated DC date: 04/24/17 Family notified: Son, [name] Transport by: Corey Harold   Per MD patient ready for DC to [facility]. RN, patient, patient's family, and facility notified of DC. Discharge Summary, Fl2, and dc contact order sent to facility. RN given number for report [phone]). DC packet on chart. Ambulance transport requested for patient.   CSW signing off.  Cedric Fishman, [place] Social Worker [phone]

## 2017-04-24 NOTE — NC FL2 (Signed)
[place] MEDICAID FL2 LEVEL OF CARE SCREENING TOOL     IDENTIFICATION  Patient Name: Kathleen Valenzuela Birthdate: 1950/09/30 Sex: female Admission Date (Current Location): 04/10/2017  [facility] and [place] Number:  [employer] and Address:  [facility] [place], [place] [address], [place], [place] [address]      Provider Number: [id number]  Attending Physician Name and Address:  Rosita Fire, MD  Relative Name and Phone Number:       Current Level of Care: Hospital Recommended Level of Care: [place] Prior Approval Number:    Date Approved/Denied:   PASRR Number:    Discharge Plan: Other (Comment) (ALF)    Current Diagnoses: Patient Active Problem List   Diagnosis Date Noted  . Obesity (BMI 30-39.9) 04/21/2017  . Aortic atherosclerosis ([place]) 04/21/2017  . Acute on chronic diastolic CHF (congestive heart failure) ([place]) 04/21/2017  . Acute respiratory failure with hypoxia ([place]) 04/19/2017  . Pulmonary edema 04/19/2017  . MRSA bacteremia 04/14/2017  . Essential hypertension 04/13/2017  . Vaginal candidiasis 04/12/2017  . Diverticulitis of colon 04/12/2017  . COPD (chronic obstructive pulmonary disease) ([place]) 04/11/2017  . Diverticulitis 04/11/2017  . Colonic diverticular abscess   . Gastric ulcer 01/10/2017  . Esophageal dysphagia 12/01/2016  . Bipolar disorder ([place]) 08/03/2015  . High cholesterol 08/03/2015  . GERD (gastroesophageal reflux disease) 08/03/2015  . Duodenal stricture 08/03/2015    Orientation RESPIRATION BLADDER Height & Weight     Self, Place  Normal Incontinent Weight: 88 kg (194 lb 1.6 oz) Height:  5\' 2"  (157.5 cm)  BEHAVIORAL SYMPTOMS/MOOD NEUROLOGICAL BOWEL NUTRITION STATUS      Incontinent Diet (Regular-No added salt)  AMBULATORY STATUS COMMUNICATION OF NEEDS Skin   Limited Assist Verbally Normal                       Personal Care Assistance Level of Assistance  Bathing, Feeding,  Dressing Bathing Assistance: Limited assistance Feeding assistance: Independent Dressing Assistance: Limited assistance     Functional Limitations Info  Sight Sight Info: Impaired        SPECIAL CARE FACTORS FREQUENCY  PT (By licensed PT), OT (By licensed OT)     PT Frequency: home health PT OT Frequency: home health OT            Contractures      Additional Factors Info  Code Status, Allergies, Isolation Precautions Code Status Info: Full Allergies Info: Penicillins     Isolation Precautions Info: MRSA     Current Medications (04/24/2017):   Discharge Medications: STOP taking these medications   ciprofloxacin 500 MG tablet Commonly known as:  CIPRO   metroNIDAZOLE 500 MG tablet Commonly known as:  FLAGYL   polyethylene glycol packet Commonly known as:  MIRALAX / GLYCOLAX     TAKE these medications   albuterol 108 (90 Base) MCG/ACT inhaler Commonly known as:  PROVENTIL HFA;VENTOLIN HFA Inhale 2 puffs into the lungs 4 (four) times daily. (0800, 1200, 1600, & 2000)   ALPRAZolam 1 MG tablet Commonly known as:  XANAX Take 0.5 tablets (0.5 mg total) by mouth 3 (three) times daily as needed for anxiety. (0800, 1400, & 2000) What changed:  how much to take  when to take this  reasons to take this   atorvastatin 10 MG tablet Commonly known as:  LIPITOR Take 10 mg by mouth daily at 8 pm.   BREO ELLIPTA 200-25 MCG/INH Aepb Generic drug:  fluticasone  furoate-vilanterol Inhale 1 puff into the lungs daily. (0800)   calcium carbonate 500 MG chewable tablet Commonly known as:  TUMS - dosed in mg elemental calcium Chew 2 tablets by mouth every 2 (two) hours as needed for indigestion (CHEW BEFORE SWALLOWING).   dicyclomine 20 MG tablet Commonly known as:  BENTYL Take 20 mg by mouth 4 (four) times daily. (0800, 1200, 1600, & 2000)   docusate sodium 100 MG capsule Commonly known as:  COLACE Take 100 mg by mouth 2 (two) times daily. (0800 &  2000)   donepezil 10 MG tablet Commonly known as:  ARICEPT Take 10 mg by mouth daily at 8 pm.   Ferrous Gluconate 324 (37.5 Fe) MG Tabs Take 324 mg by mouth 2 (two) times daily. (0800 & 2000)   fluticasone 50 MCG/ACT nasal spray Commonly known as:  FLONASE Place 1 spray into both nostrils daily. (0800)   furosemide 40 MG tablet Commonly known as:  LASIX Take 40 mg by mouth daily. (0800)   isosorbide mononitrate 30 MG 24 hr tablet Commonly known as:  IMDUR Take 30 mg by mouth daily. (0800)   lithium 300 MG tablet Take 300 mg by mouth 2 (two) times daily. (0800 & 2000)   lithium carbonate 150 MG capsule Take 150 mg by mouth every morning. Take 150 mg capsule along with 300 mg tablet to equal 450 mg.   metoprolol succinate 50 MG 24 hr tablet Commonly known as:  TOPROL-XL Take 50 mg by mouth daily. (0800)Take with or immediately following a meal.   Oxycodone HCl 10 MG Tabs Take 1 tablet (10 mg total) by mouth every 8 (eight) hours as needed (FOR Pain). What changed:  Another medication with the same name was removed. Continue taking this medication, and follow the directions you see here.   pantoprazole 40 MG tablet Commonly known as:  PROTONIX Take 1 tablet (40 mg total) by mouth 2 (two) times daily before a meal.   PATADAY 0.2 % Soln Generic drug:  Olopatadine HCl Place 1 drop into both eyes daily. (0800)   potassium chloride 10 MEQ tablet Commonly known as:  K-DUR,KLOR-CON Take 2 tablets (20 mEq total) by mouth daily. (0800) What changed:  how much to take   RISAMINE 0.44-20.625 % Oint Generic drug:  Menthol-Zinc Oxide Apply 1 application topically 4 (four) times daily as needed (for rash/redness).   tiZANidine 2 MG tablet Commonly known as:  ZANAFLEX Take 2 mg by mouth 2 (two) times daily. (0800 & 2000)   traZODone 50 MG tablet Commonly known as:  DESYREL Take 50 mg by mouth daily at 8 pm.   VOLTAREN 1 % Gel Generic drug:  diclofenac  sodium Apply 4 g topically every 12 (twelve) hours as needed. For knee pain      Relevant Imaging Results:  Relevant Lab Results:   Additional Information SSN: [place]  [place], [place]

## 2017-04-24 NOTE — Progress Notes (Addendum)
4:25pm: ALF requested an order from the MD stating to dc contact precautions at the ALF. MD aware.  3:30pm: ALF called CSW and stated that patient has MRSA so she cannot return because their rooms are semi-private. CSW called back to alert facility that MRSA has been treated. Awaiting call back from facility.  2pm: CSW awaiting return call from [facility]. The RN stated that she would need permission for patient to return because she has been in the hospital for so long and should have been assessed by them. They do not have anyone to come assess patient today. CSW stressed that patient has been discharged and sent dc summary and fl2.  [name] Andrue Dini LCSWA [phone]

## 2017-04-25 DIAGNOSIS — R531 Weakness: Secondary | ICD-10-CM | POA: Diagnosis not present

## 2017-04-25 DIAGNOSIS — G8929 Other chronic pain: Secondary | ICD-10-CM | POA: Diagnosis not present

## 2017-04-25 DIAGNOSIS — K5700 Diverticulitis of small intestine with perforation and abscess without bleeding: Secondary | ICD-10-CM | POA: Diagnosis not present

## 2017-04-25 DIAGNOSIS — Z8701 Personal history of pneumonia (recurrent): Secondary | ICD-10-CM | POA: Diagnosis not present

## 2017-04-25 DIAGNOSIS — F319 Bipolar disorder, unspecified: Secondary | ICD-10-CM | POA: Diagnosis not present

## 2017-04-25 DIAGNOSIS — J449 Chronic obstructive pulmonary disease, unspecified: Secondary | ICD-10-CM | POA: Diagnosis not present

## 2017-04-27 DIAGNOSIS — R531 Weakness: Secondary | ICD-10-CM | POA: Diagnosis not present

## 2017-04-27 DIAGNOSIS — K219 Gastro-esophageal reflux disease without esophagitis: Secondary | ICD-10-CM | POA: Diagnosis not present

## 2017-04-27 DIAGNOSIS — E785 Hyperlipidemia, unspecified: Secondary | ICD-10-CM | POA: Diagnosis not present

## 2017-04-27 DIAGNOSIS — F319 Bipolar disorder, unspecified: Secondary | ICD-10-CM | POA: Diagnosis not present

## 2017-04-27 DIAGNOSIS — G8929 Other chronic pain: Secondary | ICD-10-CM | POA: Diagnosis not present

## 2017-04-27 DIAGNOSIS — Z79899 Other long term (current) drug therapy: Secondary | ICD-10-CM | POA: Diagnosis not present

## 2017-04-27 DIAGNOSIS — K5730 Diverticulosis of large intestine without perforation or abscess without bleeding: Secondary | ICD-10-CM | POA: Diagnosis not present

## 2017-04-27 DIAGNOSIS — K5700 Diverticulitis of small intestine with perforation and abscess without bleeding: Secondary | ICD-10-CM | POA: Diagnosis not present

## 2017-04-27 DIAGNOSIS — Z8701 Personal history of pneumonia (recurrent): Secondary | ICD-10-CM | POA: Diagnosis not present

## 2017-04-27 DIAGNOSIS — J449 Chronic obstructive pulmonary disease, unspecified: Secondary | ICD-10-CM | POA: Diagnosis not present

## 2017-04-27 DIAGNOSIS — Z79891 Long term (current) use of opiate analgesic: Secondary | ICD-10-CM | POA: Diagnosis not present

## 2017-04-27 DIAGNOSIS — Z87891 Personal history of nicotine dependence: Secondary | ICD-10-CM | POA: Diagnosis not present

## 2017-04-27 DIAGNOSIS — F419 Anxiety disorder, unspecified: Secondary | ICD-10-CM | POA: Diagnosis not present

## 2017-04-27 DIAGNOSIS — R0902 Hypoxemia: Secondary | ICD-10-CM | POA: Diagnosis not present

## 2017-04-27 DIAGNOSIS — F23 Brief psychotic disorder: Secondary | ICD-10-CM | POA: Diagnosis not present

## 2017-04-28 DIAGNOSIS — Z8701 Personal history of pneumonia (recurrent): Secondary | ICD-10-CM | POA: Diagnosis not present

## 2017-04-28 DIAGNOSIS — K5700 Diverticulitis of small intestine with perforation and abscess without bleeding: Secondary | ICD-10-CM | POA: Diagnosis not present

## 2017-04-28 DIAGNOSIS — F319 Bipolar disorder, unspecified: Secondary | ICD-10-CM | POA: Diagnosis not present

## 2017-04-28 DIAGNOSIS — J449 Chronic obstructive pulmonary disease, unspecified: Secondary | ICD-10-CM | POA: Diagnosis not present

## 2017-04-28 DIAGNOSIS — G8929 Other chronic pain: Secondary | ICD-10-CM | POA: Diagnosis not present

## 2017-04-28 DIAGNOSIS — R531 Weakness: Secondary | ICD-10-CM | POA: Diagnosis not present

## 2017-04-29 DIAGNOSIS — K5700 Diverticulitis of small intestine with perforation and abscess without bleeding: Secondary | ICD-10-CM | POA: Diagnosis not present

## 2017-04-29 DIAGNOSIS — Z8701 Personal history of pneumonia (recurrent): Secondary | ICD-10-CM | POA: Diagnosis not present

## 2017-04-29 DIAGNOSIS — J449 Chronic obstructive pulmonary disease, unspecified: Secondary | ICD-10-CM | POA: Diagnosis not present

## 2017-04-29 DIAGNOSIS — F319 Bipolar disorder, unspecified: Secondary | ICD-10-CM | POA: Diagnosis not present

## 2017-04-29 DIAGNOSIS — G8929 Other chronic pain: Secondary | ICD-10-CM | POA: Diagnosis not present

## 2017-04-29 DIAGNOSIS — R531 Weakness: Secondary | ICD-10-CM | POA: Diagnosis not present

## 2017-04-30 DIAGNOSIS — F319 Bipolar disorder, unspecified: Secondary | ICD-10-CM | POA: Diagnosis not present

## 2017-04-30 DIAGNOSIS — J449 Chronic obstructive pulmonary disease, unspecified: Secondary | ICD-10-CM | POA: Diagnosis not present

## 2017-04-30 DIAGNOSIS — R531 Weakness: Secondary | ICD-10-CM | POA: Diagnosis not present

## 2017-04-30 DIAGNOSIS — Z8701 Personal history of pneumonia (recurrent): Secondary | ICD-10-CM | POA: Diagnosis not present

## 2017-04-30 DIAGNOSIS — G8929 Other chronic pain: Secondary | ICD-10-CM | POA: Diagnosis not present

## 2017-04-30 DIAGNOSIS — K5700 Diverticulitis of small intestine with perforation and abscess without bleeding: Secondary | ICD-10-CM | POA: Diagnosis not present

## 2017-05-01 DIAGNOSIS — Z713 Dietary counseling and surveillance: Secondary | ICD-10-CM | POA: Diagnosis not present

## 2017-05-01 DIAGNOSIS — K5700 Diverticulitis of small intestine with perforation and abscess without bleeding: Secondary | ICD-10-CM | POA: Diagnosis not present

## 2017-05-01 DIAGNOSIS — Z299 Encounter for prophylactic measures, unspecified: Secondary | ICD-10-CM | POA: Diagnosis not present

## 2017-05-01 DIAGNOSIS — Z8701 Personal history of pneumonia (recurrent): Secondary | ICD-10-CM | POA: Diagnosis not present

## 2017-05-01 DIAGNOSIS — G8929 Other chronic pain: Secondary | ICD-10-CM | POA: Diagnosis not present

## 2017-05-01 DIAGNOSIS — M170 Bilateral primary osteoarthritis of knee: Secondary | ICD-10-CM | POA: Diagnosis not present

## 2017-05-01 DIAGNOSIS — J449 Chronic obstructive pulmonary disease, unspecified: Secondary | ICD-10-CM | POA: Diagnosis not present

## 2017-05-01 DIAGNOSIS — F319 Bipolar disorder, unspecified: Secondary | ICD-10-CM | POA: Diagnosis not present

## 2017-05-01 DIAGNOSIS — Z6836 Body mass index (BMI) 36.0-36.9, adult: Secondary | ICD-10-CM | POA: Diagnosis not present

## 2017-05-01 DIAGNOSIS — R531 Weakness: Secondary | ICD-10-CM | POA: Diagnosis not present

## 2017-05-03 DIAGNOSIS — G8929 Other chronic pain: Secondary | ICD-10-CM | POA: Diagnosis present

## 2017-05-03 DIAGNOSIS — E876 Hypokalemia: Secondary | ICD-10-CM | POA: Diagnosis not present

## 2017-05-03 DIAGNOSIS — E785 Hyperlipidemia, unspecified: Secondary | ICD-10-CM | POA: Diagnosis not present

## 2017-05-03 DIAGNOSIS — Z79891 Long term (current) use of opiate analgesic: Secondary | ICD-10-CM | POA: Diagnosis not present

## 2017-05-03 DIAGNOSIS — J449 Chronic obstructive pulmonary disease, unspecified: Secondary | ICD-10-CM | POA: Diagnosis present

## 2017-05-03 DIAGNOSIS — K5780 Diverticulitis of intestine, part unspecified, with perforation and abscess without bleeding: Secondary | ICD-10-CM | POA: Diagnosis not present

## 2017-05-03 DIAGNOSIS — R41841 Cognitive communication deficit: Secondary | ICD-10-CM | POA: Diagnosis not present

## 2017-05-03 DIAGNOSIS — I82409 Acute embolism and thrombosis of unspecified deep veins of unspecified lower extremity: Secondary | ICD-10-CM | POA: Diagnosis not present

## 2017-05-03 DIAGNOSIS — K5720 Diverticulitis of large intestine with perforation and abscess without bleeding: Secondary | ICD-10-CM | POA: Diagnosis present

## 2017-05-03 DIAGNOSIS — F319 Bipolar disorder, unspecified: Secondary | ICD-10-CM | POA: Diagnosis not present

## 2017-05-03 DIAGNOSIS — K5792 Diverticulitis of intestine, part unspecified, without perforation or abscess without bleeding: Secondary | ICD-10-CM | POA: Diagnosis not present

## 2017-05-03 DIAGNOSIS — I2699 Other pulmonary embolism without acute cor pulmonale: Secondary | ICD-10-CM | POA: Diagnosis present

## 2017-05-03 DIAGNOSIS — T43595D Adverse effect of other antipsychotics and neuroleptics, subsequent encounter: Secondary | ICD-10-CM | POA: Diagnosis not present

## 2017-05-03 DIAGNOSIS — R2689 Other abnormalities of gait and mobility: Secondary | ICD-10-CM | POA: Diagnosis not present

## 2017-05-03 DIAGNOSIS — Z886 Allergy status to analgesic agent status: Secondary | ICD-10-CM | POA: Diagnosis not present

## 2017-05-03 DIAGNOSIS — F419 Anxiety disorder, unspecified: Secondary | ICD-10-CM | POA: Diagnosis present

## 2017-05-03 DIAGNOSIS — R278 Other lack of coordination: Secondary | ICD-10-CM | POA: Diagnosis not present

## 2017-05-03 DIAGNOSIS — I82819 Embolism and thrombosis of superficial veins of unspecified lower extremities: Secondary | ICD-10-CM | POA: Diagnosis present

## 2017-05-03 DIAGNOSIS — Z888 Allergy status to other drugs, medicaments and biological substances status: Secondary | ICD-10-CM | POA: Diagnosis not present

## 2017-05-03 DIAGNOSIS — Z7951 Long term (current) use of inhaled steroids: Secondary | ICD-10-CM | POA: Diagnosis not present

## 2017-05-03 DIAGNOSIS — I10 Essential (primary) hypertension: Secondary | ICD-10-CM | POA: Diagnosis not present

## 2017-05-03 DIAGNOSIS — T43595A Adverse effect of other antipsychotics and neuroleptics, initial encounter: Secondary | ICD-10-CM | POA: Diagnosis not present

## 2017-05-03 DIAGNOSIS — Z880 Allergy status to penicillin: Secondary | ICD-10-CM | POA: Diagnosis not present

## 2017-05-03 DIAGNOSIS — R069 Unspecified abnormalities of breathing: Secondary | ICD-10-CM | POA: Diagnosis not present

## 2017-05-03 DIAGNOSIS — R410 Disorientation, unspecified: Secondary | ICD-10-CM | POA: Diagnosis not present

## 2017-05-03 DIAGNOSIS — T887XXD Unspecified adverse effect of drug or medicament, subsequent encounter: Secondary | ICD-10-CM | POA: Diagnosis not present

## 2017-05-03 DIAGNOSIS — Z79899 Other long term (current) drug therapy: Secondary | ICD-10-CM | POA: Diagnosis not present

## 2017-05-03 DIAGNOSIS — I82431 Acute embolism and thrombosis of right popliteal vein: Secondary | ICD-10-CM | POA: Diagnosis present

## 2017-05-03 DIAGNOSIS — M6281 Muscle weakness (generalized): Secondary | ICD-10-CM | POA: Diagnosis not present

## 2017-05-16 DIAGNOSIS — I10 Essential (primary) hypertension: Secondary | ICD-10-CM | POA: Diagnosis not present

## 2017-05-16 DIAGNOSIS — E876 Hypokalemia: Secondary | ICD-10-CM | POA: Diagnosis not present

## 2017-05-16 DIAGNOSIS — J449 Chronic obstructive pulmonary disease, unspecified: Secondary | ICD-10-CM | POA: Diagnosis not present

## 2017-05-16 DIAGNOSIS — Z5181 Encounter for therapeutic drug level monitoring: Secondary | ICD-10-CM | POA: Diagnosis not present

## 2017-05-16 DIAGNOSIS — M6281 Muscle weakness (generalized): Secondary | ICD-10-CM | POA: Diagnosis not present

## 2017-05-16 DIAGNOSIS — K5780 Diverticulitis of intestine, part unspecified, with perforation and abscess without bleeding: Secondary | ICD-10-CM | POA: Diagnosis not present

## 2017-05-16 DIAGNOSIS — E785 Hyperlipidemia, unspecified: Secondary | ICD-10-CM | POA: Diagnosis not present

## 2017-05-16 DIAGNOSIS — R2689 Other abnormalities of gait and mobility: Secondary | ICD-10-CM | POA: Diagnosis not present

## 2017-05-16 DIAGNOSIS — K5720 Diverticulitis of large intestine with perforation and abscess without bleeding: Secondary | ICD-10-CM | POA: Diagnosis not present

## 2017-05-16 DIAGNOSIS — F319 Bipolar disorder, unspecified: Secondary | ICD-10-CM | POA: Diagnosis not present

## 2017-05-16 DIAGNOSIS — F419 Anxiety disorder, unspecified: Secondary | ICD-10-CM | POA: Diagnosis not present

## 2017-05-16 DIAGNOSIS — R410 Disorientation, unspecified: Secondary | ICD-10-CM | POA: Diagnosis not present

## 2017-05-16 DIAGNOSIS — I2699 Other pulmonary embolism without acute cor pulmonale: Secondary | ICD-10-CM | POA: Diagnosis not present

## 2017-05-16 DIAGNOSIS — T43595D Adverse effect of other antipsychotics and neuroleptics, subsequent encounter: Secondary | ICD-10-CM | POA: Diagnosis not present

## 2017-05-16 DIAGNOSIS — T50901A Poisoning by unspecified drugs, medicaments and biological substances, accidental (unintentional), initial encounter: Secondary | ICD-10-CM | POA: Diagnosis not present

## 2017-05-16 DIAGNOSIS — Z79899 Other long term (current) drug therapy: Secondary | ICD-10-CM | POA: Diagnosis not present

## 2017-05-16 DIAGNOSIS — R278 Other lack of coordination: Secondary | ICD-10-CM | POA: Diagnosis not present

## 2017-05-16 DIAGNOSIS — T887XXD Unspecified adverse effect of drug or medicament, subsequent encounter: Secondary | ICD-10-CM | POA: Diagnosis not present

## 2017-05-16 DIAGNOSIS — I82819 Embolism and thrombosis of superficial veins of unspecified lower extremities: Secondary | ICD-10-CM | POA: Diagnosis not present

## 2017-05-16 DIAGNOSIS — F09 Unspecified mental disorder due to known physiological condition: Secondary | ICD-10-CM | POA: Diagnosis not present

## 2017-05-16 DIAGNOSIS — R41841 Cognitive communication deficit: Secondary | ICD-10-CM | POA: Diagnosis not present

## 2017-05-16 DIAGNOSIS — I82431 Acute embolism and thrombosis of right popliteal vein: Secondary | ICD-10-CM | POA: Diagnosis not present

## 2017-05-16 DIAGNOSIS — G8929 Other chronic pain: Secondary | ICD-10-CM | POA: Diagnosis not present

## 2017-05-16 DIAGNOSIS — K5730 Diverticulosis of large intestine without perforation or abscess without bleeding: Secondary | ICD-10-CM | POA: Diagnosis not present

## 2017-05-16 DIAGNOSIS — I82409 Acute embolism and thrombosis of unspecified deep veins of unspecified lower extremity: Secondary | ICD-10-CM | POA: Diagnosis not present

## 2017-05-18 DIAGNOSIS — K5730 Diverticulosis of large intestine without perforation or abscess without bleeding: Secondary | ICD-10-CM | POA: Diagnosis not present

## 2017-05-18 DIAGNOSIS — I82409 Acute embolism and thrombosis of unspecified deep veins of unspecified lower extremity: Secondary | ICD-10-CM | POA: Diagnosis not present

## 2017-05-18 DIAGNOSIS — I2699 Other pulmonary embolism without acute cor pulmonale: Secondary | ICD-10-CM | POA: Diagnosis not present

## 2017-05-18 DIAGNOSIS — T50901A Poisoning by unspecified drugs, medicaments and biological substances, accidental (unintentional), initial encounter: Secondary | ICD-10-CM | POA: Diagnosis not present

## 2017-05-24 DIAGNOSIS — K5720 Diverticulitis of large intestine with perforation and abscess without bleeding: Secondary | ICD-10-CM | POA: Diagnosis not present

## 2017-05-25 DIAGNOSIS — T50901A Poisoning by unspecified drugs, medicaments and biological substances, accidental (unintentional), initial encounter: Secondary | ICD-10-CM | POA: Diagnosis not present

## 2017-05-30 DIAGNOSIS — I2699 Other pulmonary embolism without acute cor pulmonale: Secondary | ICD-10-CM | POA: Diagnosis not present

## 2017-05-30 DIAGNOSIS — I82409 Acute embolism and thrombosis of unspecified deep veins of unspecified lower extremity: Secondary | ICD-10-CM | POA: Diagnosis not present

## 2017-05-30 DIAGNOSIS — T50901A Poisoning by unspecified drugs, medicaments and biological substances, accidental (unintentional), initial encounter: Secondary | ICD-10-CM | POA: Diagnosis not present

## 2017-05-31 DIAGNOSIS — F319 Bipolar disorder, unspecified: Secondary | ICD-10-CM | POA: Diagnosis not present

## 2017-06-05 DIAGNOSIS — F419 Anxiety disorder, unspecified: Secondary | ICD-10-CM | POA: Diagnosis not present

## 2017-06-05 DIAGNOSIS — R2689 Other abnormalities of gait and mobility: Secondary | ICD-10-CM | POA: Diagnosis not present

## 2017-06-05 DIAGNOSIS — Z5181 Encounter for therapeutic drug level monitoring: Secondary | ICD-10-CM | POA: Diagnosis not present

## 2017-06-05 DIAGNOSIS — I2699 Other pulmonary embolism without acute cor pulmonale: Secondary | ICD-10-CM | POA: Diagnosis not present

## 2017-06-05 DIAGNOSIS — Z79899 Other long term (current) drug therapy: Secondary | ICD-10-CM | POA: Diagnosis not present

## 2017-06-05 DIAGNOSIS — J449 Chronic obstructive pulmonary disease, unspecified: Secondary | ICD-10-CM | POA: Diagnosis not present

## 2017-06-05 DIAGNOSIS — R278 Other lack of coordination: Secondary | ICD-10-CM | POA: Diagnosis not present

## 2017-06-05 DIAGNOSIS — I10 Essential (primary) hypertension: Secondary | ICD-10-CM | POA: Diagnosis not present

## 2017-06-05 DIAGNOSIS — M6281 Muscle weakness (generalized): Secondary | ICD-10-CM | POA: Diagnosis not present

## 2017-06-05 DIAGNOSIS — R410 Disorientation, unspecified: Secondary | ICD-10-CM | POA: Diagnosis not present

## 2017-06-05 DIAGNOSIS — T43595D Adverse effect of other antipsychotics and neuroleptics, subsequent encounter: Secondary | ICD-10-CM | POA: Diagnosis not present

## 2017-06-05 DIAGNOSIS — K5780 Diverticulitis of intestine, part unspecified, with perforation and abscess without bleeding: Secondary | ICD-10-CM | POA: Diagnosis not present

## 2017-06-05 DIAGNOSIS — E785 Hyperlipidemia, unspecified: Secondary | ICD-10-CM | POA: Diagnosis not present

## 2017-06-05 DIAGNOSIS — I82409 Acute embolism and thrombosis of unspecified deep veins of unspecified lower extremity: Secondary | ICD-10-CM | POA: Diagnosis not present

## 2017-06-05 DIAGNOSIS — F319 Bipolar disorder, unspecified: Secondary | ICD-10-CM | POA: Diagnosis not present

## 2017-06-05 DIAGNOSIS — T887XXD Unspecified adverse effect of drug or medicament, subsequent encounter: Secondary | ICD-10-CM | POA: Diagnosis not present

## 2017-06-05 DIAGNOSIS — G8929 Other chronic pain: Secondary | ICD-10-CM | POA: Diagnosis not present

## 2017-06-05 DIAGNOSIS — E876 Hypokalemia: Secondary | ICD-10-CM | POA: Diagnosis not present

## 2017-06-05 DIAGNOSIS — F09 Unspecified mental disorder due to known physiological condition: Secondary | ICD-10-CM | POA: Diagnosis not present

## 2017-06-05 DIAGNOSIS — R41841 Cognitive communication deficit: Secondary | ICD-10-CM | POA: Diagnosis not present

## 2017-06-08 DIAGNOSIS — Z79899 Other long term (current) drug therapy: Secondary | ICD-10-CM | POA: Diagnosis not present

## 2017-06-08 DIAGNOSIS — Z5181 Encounter for therapeutic drug level monitoring: Secondary | ICD-10-CM | POA: Diagnosis not present

## 2017-06-08 DIAGNOSIS — R410 Disorientation, unspecified: Secondary | ICD-10-CM | POA: Diagnosis not present

## 2017-06-08 DIAGNOSIS — F319 Bipolar disorder, unspecified: Secondary | ICD-10-CM | POA: Diagnosis not present

## 2017-06-08 DIAGNOSIS — F09 Unspecified mental disorder due to known physiological condition: Secondary | ICD-10-CM | POA: Diagnosis not present

## 2017-06-10 DIAGNOSIS — F09 Unspecified mental disorder due to known physiological condition: Secondary | ICD-10-CM | POA: Diagnosis not present

## 2017-06-10 DIAGNOSIS — F319 Bipolar disorder, unspecified: Secondary | ICD-10-CM | POA: Diagnosis not present

## 2017-06-10 DIAGNOSIS — Z5181 Encounter for therapeutic drug level monitoring: Secondary | ICD-10-CM | POA: Diagnosis not present

## 2017-06-10 DIAGNOSIS — R410 Disorientation, unspecified: Secondary | ICD-10-CM | POA: Diagnosis not present

## 2017-06-10 DIAGNOSIS — Z79899 Other long term (current) drug therapy: Secondary | ICD-10-CM | POA: Diagnosis not present

## 2017-06-11 DIAGNOSIS — R410 Disorientation, unspecified: Secondary | ICD-10-CM | POA: Diagnosis not present

## 2017-06-11 DIAGNOSIS — Z79899 Other long term (current) drug therapy: Secondary | ICD-10-CM | POA: Diagnosis not present

## 2017-06-11 DIAGNOSIS — Z5181 Encounter for therapeutic drug level monitoring: Secondary | ICD-10-CM | POA: Diagnosis not present

## 2017-06-11 DIAGNOSIS — F09 Unspecified mental disorder due to known physiological condition: Secondary | ICD-10-CM | POA: Diagnosis not present

## 2017-06-11 DIAGNOSIS — F319 Bipolar disorder, unspecified: Secondary | ICD-10-CM | POA: Diagnosis not present

## 2017-06-15 DIAGNOSIS — R410 Disorientation, unspecified: Secondary | ICD-10-CM | POA: Diagnosis not present

## 2017-06-15 DIAGNOSIS — Z79899 Other long term (current) drug therapy: Secondary | ICD-10-CM | POA: Diagnosis not present

## 2017-06-15 DIAGNOSIS — F09 Unspecified mental disorder due to known physiological condition: Secondary | ICD-10-CM | POA: Diagnosis not present

## 2017-06-15 DIAGNOSIS — R0602 Shortness of breath: Secondary | ICD-10-CM | POA: Diagnosis not present

## 2017-06-15 DIAGNOSIS — Z5181 Encounter for therapeutic drug level monitoring: Secondary | ICD-10-CM | POA: Diagnosis not present

## 2017-06-15 DIAGNOSIS — F319 Bipolar disorder, unspecified: Secondary | ICD-10-CM | POA: Diagnosis not present

## 2017-06-16 DIAGNOSIS — R079 Chest pain, unspecified: Secondary | ICD-10-CM | POA: Diagnosis not present

## 2017-06-28 DIAGNOSIS — R0602 Shortness of breath: Secondary | ICD-10-CM | POA: Diagnosis not present

## 2017-06-30 DIAGNOSIS — R2689 Other abnormalities of gait and mobility: Secondary | ICD-10-CM | POA: Diagnosis not present

## 2017-06-30 DIAGNOSIS — R278 Other lack of coordination: Secondary | ICD-10-CM | POA: Diagnosis not present

## 2017-06-30 DIAGNOSIS — M6281 Muscle weakness (generalized): Secondary | ICD-10-CM | POA: Diagnosis not present

## 2017-07-01 DIAGNOSIS — M6281 Muscle weakness (generalized): Secondary | ICD-10-CM | POA: Diagnosis not present

## 2017-07-01 DIAGNOSIS — R2689 Other abnormalities of gait and mobility: Secondary | ICD-10-CM | POA: Diagnosis not present

## 2017-07-01 DIAGNOSIS — R278 Other lack of coordination: Secondary | ICD-10-CM | POA: Diagnosis not present

## 2017-07-04 DIAGNOSIS — M6281 Muscle weakness (generalized): Secondary | ICD-10-CM | POA: Diagnosis not present

## 2017-07-04 DIAGNOSIS — R278 Other lack of coordination: Secondary | ICD-10-CM | POA: Diagnosis not present

## 2017-07-04 DIAGNOSIS — R2689 Other abnormalities of gait and mobility: Secondary | ICD-10-CM | POA: Diagnosis not present

## 2017-07-05 DIAGNOSIS — R278 Other lack of coordination: Secondary | ICD-10-CM | POA: Diagnosis not present

## 2017-07-05 DIAGNOSIS — M6281 Muscle weakness (generalized): Secondary | ICD-10-CM | POA: Diagnosis not present

## 2017-07-05 DIAGNOSIS — R2689 Other abnormalities of gait and mobility: Secondary | ICD-10-CM | POA: Diagnosis not present

## 2017-07-06 DIAGNOSIS — R2689 Other abnormalities of gait and mobility: Secondary | ICD-10-CM | POA: Diagnosis not present

## 2017-07-06 DIAGNOSIS — L600 Ingrowing nail: Secondary | ICD-10-CM | POA: Diagnosis not present

## 2017-07-06 DIAGNOSIS — M79675 Pain in left toe(s): Secondary | ICD-10-CM | POA: Diagnosis not present

## 2017-07-06 DIAGNOSIS — L03032 Cellulitis of left toe: Secondary | ICD-10-CM | POA: Diagnosis not present

## 2017-07-06 DIAGNOSIS — M6281 Muscle weakness (generalized): Secondary | ICD-10-CM | POA: Diagnosis not present

## 2017-07-06 DIAGNOSIS — M79672 Pain in left foot: Secondary | ICD-10-CM | POA: Diagnosis not present

## 2017-07-07 DIAGNOSIS — M6281 Muscle weakness (generalized): Secondary | ICD-10-CM | POA: Diagnosis not present

## 2017-07-07 DIAGNOSIS — R2689 Other abnormalities of gait and mobility: Secondary | ICD-10-CM | POA: Diagnosis not present

## 2017-07-08 DIAGNOSIS — M6281 Muscle weakness (generalized): Secondary | ICD-10-CM | POA: Diagnosis not present

## 2017-07-08 DIAGNOSIS — R2689 Other abnormalities of gait and mobility: Secondary | ICD-10-CM | POA: Diagnosis not present

## 2017-07-10 DIAGNOSIS — M6281 Muscle weakness (generalized): Secondary | ICD-10-CM | POA: Diagnosis not present

## 2017-07-10 DIAGNOSIS — R2689 Other abnormalities of gait and mobility: Secondary | ICD-10-CM | POA: Diagnosis not present

## 2017-07-11 DIAGNOSIS — M6281 Muscle weakness (generalized): Secondary | ICD-10-CM | POA: Diagnosis not present

## 2017-07-11 DIAGNOSIS — R2689 Other abnormalities of gait and mobility: Secondary | ICD-10-CM | POA: Diagnosis not present

## 2017-07-12 DIAGNOSIS — R2689 Other abnormalities of gait and mobility: Secondary | ICD-10-CM | POA: Diagnosis not present

## 2017-07-12 DIAGNOSIS — M6281 Muscle weakness (generalized): Secondary | ICD-10-CM | POA: Diagnosis not present

## 2017-07-13 DIAGNOSIS — M6281 Muscle weakness (generalized): Secondary | ICD-10-CM | POA: Diagnosis not present

## 2017-07-13 DIAGNOSIS — R2689 Other abnormalities of gait and mobility: Secondary | ICD-10-CM | POA: Diagnosis not present

## 2017-07-14 DIAGNOSIS — R2689 Other abnormalities of gait and mobility: Secondary | ICD-10-CM | POA: Diagnosis not present

## 2017-07-14 DIAGNOSIS — M6281 Muscle weakness (generalized): Secondary | ICD-10-CM | POA: Diagnosis not present

## 2017-07-17 DIAGNOSIS — M6281 Muscle weakness (generalized): Secondary | ICD-10-CM | POA: Diagnosis not present

## 2017-07-17 DIAGNOSIS — R2689 Other abnormalities of gait and mobility: Secondary | ICD-10-CM | POA: Diagnosis not present

## 2017-07-18 DIAGNOSIS — M6281 Muscle weakness (generalized): Secondary | ICD-10-CM | POA: Diagnosis not present

## 2017-07-18 DIAGNOSIS — R2689 Other abnormalities of gait and mobility: Secondary | ICD-10-CM | POA: Diagnosis not present

## 2017-07-19 DIAGNOSIS — M6281 Muscle weakness (generalized): Secondary | ICD-10-CM | POA: Diagnosis not present

## 2017-07-19 DIAGNOSIS — R2689 Other abnormalities of gait and mobility: Secondary | ICD-10-CM | POA: Diagnosis not present

## 2017-07-20 DIAGNOSIS — L03032 Cellulitis of left toe: Secondary | ICD-10-CM | POA: Diagnosis not present

## 2017-07-20 DIAGNOSIS — M6281 Muscle weakness (generalized): Secondary | ICD-10-CM | POA: Diagnosis not present

## 2017-07-20 DIAGNOSIS — R2689 Other abnormalities of gait and mobility: Secondary | ICD-10-CM | POA: Diagnosis not present

## 2017-07-20 DIAGNOSIS — M79675 Pain in left toe(s): Secondary | ICD-10-CM | POA: Diagnosis not present

## 2017-07-21 DIAGNOSIS — R2689 Other abnormalities of gait and mobility: Secondary | ICD-10-CM | POA: Diagnosis not present

## 2017-07-21 DIAGNOSIS — M6281 Muscle weakness (generalized): Secondary | ICD-10-CM | POA: Diagnosis not present

## 2017-08-07 DIAGNOSIS — M6281 Muscle weakness (generalized): Secondary | ICD-10-CM | POA: Diagnosis not present

## 2017-08-07 DIAGNOSIS — R2689 Other abnormalities of gait and mobility: Secondary | ICD-10-CM | POA: Diagnosis not present

## 2017-08-07 DIAGNOSIS — I82409 Acute embolism and thrombosis of unspecified deep veins of unspecified lower extremity: Secondary | ICD-10-CM | POA: Diagnosis not present

## 2017-08-07 DIAGNOSIS — R278 Other lack of coordination: Secondary | ICD-10-CM | POA: Diagnosis not present

## 2017-08-08 DIAGNOSIS — R2689 Other abnormalities of gait and mobility: Secondary | ICD-10-CM | POA: Diagnosis not present

## 2017-08-08 DIAGNOSIS — M6281 Muscle weakness (generalized): Secondary | ICD-10-CM | POA: Diagnosis not present

## 2017-08-08 DIAGNOSIS — I82409 Acute embolism and thrombosis of unspecified deep veins of unspecified lower extremity: Secondary | ICD-10-CM | POA: Diagnosis not present

## 2017-08-08 DIAGNOSIS — R278 Other lack of coordination: Secondary | ICD-10-CM | POA: Diagnosis not present

## 2017-08-09 DIAGNOSIS — M6281 Muscle weakness (generalized): Secondary | ICD-10-CM | POA: Diagnosis not present

## 2017-08-09 DIAGNOSIS — R278 Other lack of coordination: Secondary | ICD-10-CM | POA: Diagnosis not present

## 2017-08-09 DIAGNOSIS — I82409 Acute embolism and thrombosis of unspecified deep veins of unspecified lower extremity: Secondary | ICD-10-CM | POA: Diagnosis not present

## 2017-08-09 DIAGNOSIS — R2689 Other abnormalities of gait and mobility: Secondary | ICD-10-CM | POA: Diagnosis not present

## 2017-08-10 DIAGNOSIS — G894 Chronic pain syndrome: Secondary | ICD-10-CM | POA: Diagnosis not present

## 2017-08-10 DIAGNOSIS — Z8709 Personal history of other diseases of the respiratory system: Secondary | ICD-10-CM | POA: Diagnosis not present

## 2017-08-10 DIAGNOSIS — T50901A Poisoning by unspecified drugs, medicaments and biological substances, accidental (unintentional), initial encounter: Secondary | ICD-10-CM | POA: Diagnosis not present

## 2017-08-10 DIAGNOSIS — F319 Bipolar disorder, unspecified: Secondary | ICD-10-CM | POA: Diagnosis not present

## 2017-08-16 DIAGNOSIS — G894 Chronic pain syndrome: Secondary | ICD-10-CM | POA: Diagnosis not present

## 2017-08-16 DIAGNOSIS — M419 Scoliosis, unspecified: Secondary | ICD-10-CM | POA: Diagnosis not present

## 2017-08-16 DIAGNOSIS — M25569 Pain in unspecified knee: Secondary | ICD-10-CM | POA: Diagnosis not present

## 2017-08-16 DIAGNOSIS — Z79891 Long term (current) use of opiate analgesic: Secondary | ICD-10-CM | POA: Diagnosis not present

## 2017-08-16 DIAGNOSIS — Z79899 Other long term (current) drug therapy: Secondary | ICD-10-CM | POA: Diagnosis not present

## 2017-08-16 DIAGNOSIS — M5136 Other intervertebral disc degeneration, lumbar region: Secondary | ICD-10-CM | POA: Diagnosis not present

## 2017-08-23 DIAGNOSIS — L600 Ingrowing nail: Secondary | ICD-10-CM | POA: Diagnosis not present

## 2017-08-23 DIAGNOSIS — M79672 Pain in left foot: Secondary | ICD-10-CM | POA: Diagnosis not present

## 2017-08-23 DIAGNOSIS — L03032 Cellulitis of left toe: Secondary | ICD-10-CM | POA: Diagnosis not present

## 2017-08-23 DIAGNOSIS — M79675 Pain in left toe(s): Secondary | ICD-10-CM | POA: Diagnosis not present

## 2017-08-24 DIAGNOSIS — G8929 Other chronic pain: Secondary | ICD-10-CM | POA: Diagnosis not present

## 2017-08-24 DIAGNOSIS — J449 Chronic obstructive pulmonary disease, unspecified: Secondary | ICD-10-CM | POA: Diagnosis not present

## 2017-08-24 DIAGNOSIS — E785 Hyperlipidemia, unspecified: Secondary | ICD-10-CM | POA: Diagnosis not present

## 2017-08-24 DIAGNOSIS — Z6832 Body mass index (BMI) 32.0-32.9, adult: Secondary | ICD-10-CM | POA: Diagnosis not present

## 2017-08-24 DIAGNOSIS — H1010 Acute atopic conjunctivitis, unspecified eye: Secondary | ICD-10-CM | POA: Diagnosis not present

## 2017-08-24 DIAGNOSIS — F0390 Unspecified dementia without behavioral disturbance: Secondary | ICD-10-CM | POA: Diagnosis not present

## 2017-08-24 DIAGNOSIS — F319 Bipolar disorder, unspecified: Secondary | ICD-10-CM | POA: Diagnosis not present

## 2017-08-24 DIAGNOSIS — I10 Essential (primary) hypertension: Secondary | ICD-10-CM | POA: Diagnosis not present

## 2017-08-24 DIAGNOSIS — M170 Bilateral primary osteoarthritis of knee: Secondary | ICD-10-CM | POA: Diagnosis not present

## 2017-08-24 DIAGNOSIS — Z299 Encounter for prophylactic measures, unspecified: Secondary | ICD-10-CM | POA: Diagnosis not present

## 2017-08-24 DIAGNOSIS — R21 Rash and other nonspecific skin eruption: Secondary | ICD-10-CM | POA: Diagnosis not present

## 2017-08-31 DIAGNOSIS — I2699 Other pulmonary embolism without acute cor pulmonale: Secondary | ICD-10-CM | POA: Diagnosis not present

## 2017-08-31 DIAGNOSIS — F319 Bipolar disorder, unspecified: Secondary | ICD-10-CM | POA: Diagnosis not present

## 2017-08-31 DIAGNOSIS — G894 Chronic pain syndrome: Secondary | ICD-10-CM | POA: Diagnosis not present

## 2017-08-31 DIAGNOSIS — K210 Gastro-esophageal reflux disease with esophagitis: Secondary | ICD-10-CM | POA: Diagnosis not present

## 2017-09-01 DIAGNOSIS — F419 Anxiety disorder, unspecified: Secondary | ICD-10-CM | POA: Diagnosis not present

## 2017-09-01 DIAGNOSIS — F3132 Bipolar disorder, current episode depressed, moderate: Secondary | ICD-10-CM | POA: Diagnosis not present

## 2017-09-08 DIAGNOSIS — F329 Major depressive disorder, single episode, unspecified: Secondary | ICD-10-CM | POA: Diagnosis not present

## 2017-09-08 DIAGNOSIS — F319 Bipolar disorder, unspecified: Secondary | ICD-10-CM | POA: Diagnosis not present

## 2017-09-08 DIAGNOSIS — F419 Anxiety disorder, unspecified: Secondary | ICD-10-CM | POA: Diagnosis not present

## 2017-09-13 DIAGNOSIS — M79675 Pain in left toe(s): Secondary | ICD-10-CM | POA: Diagnosis not present

## 2017-09-13 DIAGNOSIS — M79672 Pain in left foot: Secondary | ICD-10-CM | POA: Diagnosis not present

## 2017-09-13 DIAGNOSIS — L600 Ingrowing nail: Secondary | ICD-10-CM | POA: Diagnosis not present

## 2017-09-15 DIAGNOSIS — M47816 Spondylosis without myelopathy or radiculopathy, lumbar region: Secondary | ICD-10-CM | POA: Diagnosis not present

## 2017-09-15 DIAGNOSIS — M5136 Other intervertebral disc degeneration, lumbar region: Secondary | ICD-10-CM | POA: Diagnosis not present

## 2017-09-15 DIAGNOSIS — G894 Chronic pain syndrome: Secondary | ICD-10-CM | POA: Diagnosis not present

## 2017-09-15 DIAGNOSIS — M25569 Pain in unspecified knee: Secondary | ICD-10-CM | POA: Diagnosis not present

## 2017-09-15 DIAGNOSIS — Z79899 Other long term (current) drug therapy: Secondary | ICD-10-CM | POA: Diagnosis not present

## 2017-09-15 DIAGNOSIS — Z79891 Long term (current) use of opiate analgesic: Secondary | ICD-10-CM | POA: Diagnosis not present

## 2017-09-22 DIAGNOSIS — F3132 Bipolar disorder, current episode depressed, moderate: Secondary | ICD-10-CM | POA: Diagnosis not present

## 2017-09-22 DIAGNOSIS — F419 Anxiety disorder, unspecified: Secondary | ICD-10-CM | POA: Diagnosis not present

## 2017-09-27 DIAGNOSIS — L03032 Cellulitis of left toe: Secondary | ICD-10-CM | POA: Diagnosis not present

## 2017-09-27 DIAGNOSIS — M79675 Pain in left toe(s): Secondary | ICD-10-CM | POA: Diagnosis not present

## 2017-09-27 DIAGNOSIS — M79672 Pain in left foot: Secondary | ICD-10-CM | POA: Diagnosis not present

## 2017-09-29 DIAGNOSIS — F419 Anxiety disorder, unspecified: Secondary | ICD-10-CM | POA: Diagnosis not present

## 2017-09-29 DIAGNOSIS — F3132 Bipolar disorder, current episode depressed, moderate: Secondary | ICD-10-CM | POA: Diagnosis not present

## 2017-10-12 DIAGNOSIS — G894 Chronic pain syndrome: Secondary | ICD-10-CM | POA: Diagnosis not present

## 2017-10-12 DIAGNOSIS — L309 Dermatitis, unspecified: Secondary | ICD-10-CM | POA: Diagnosis not present

## 2017-10-12 DIAGNOSIS — K5792 Diverticulitis of intestine, part unspecified, without perforation or abscess without bleeding: Secondary | ICD-10-CM | POA: Diagnosis not present

## 2017-10-12 DIAGNOSIS — J449 Chronic obstructive pulmonary disease, unspecified: Secondary | ICD-10-CM | POA: Diagnosis not present

## 2017-10-13 DIAGNOSIS — M25569 Pain in unspecified knee: Secondary | ICD-10-CM | POA: Diagnosis not present

## 2017-10-13 DIAGNOSIS — M5136 Other intervertebral disc degeneration, lumbar region: Secondary | ICD-10-CM | POA: Diagnosis not present

## 2017-10-13 DIAGNOSIS — G894 Chronic pain syndrome: Secondary | ICD-10-CM | POA: Diagnosis not present

## 2017-10-13 DIAGNOSIS — M419 Scoliosis, unspecified: Secondary | ICD-10-CM | POA: Diagnosis not present

## 2017-10-20 DIAGNOSIS — F419 Anxiety disorder, unspecified: Secondary | ICD-10-CM | POA: Diagnosis not present

## 2017-10-20 DIAGNOSIS — F3132 Bipolar disorder, current episode depressed, moderate: Secondary | ICD-10-CM | POA: Diagnosis not present

## 2017-10-23 DIAGNOSIS — E7849 Other hyperlipidemia: Secondary | ICD-10-CM | POA: Diagnosis not present

## 2017-10-23 DIAGNOSIS — E119 Type 2 diabetes mellitus without complications: Secondary | ICD-10-CM | POA: Diagnosis not present

## 2017-10-23 DIAGNOSIS — E559 Vitamin D deficiency, unspecified: Secondary | ICD-10-CM | POA: Diagnosis not present

## 2017-10-23 DIAGNOSIS — D518 Other vitamin B12 deficiency anemias: Secondary | ICD-10-CM | POA: Diagnosis not present

## 2017-10-23 DIAGNOSIS — Z79899 Other long term (current) drug therapy: Secondary | ICD-10-CM | POA: Diagnosis not present

## 2017-10-23 DIAGNOSIS — E038 Other specified hypothyroidism: Secondary | ICD-10-CM | POA: Diagnosis not present

## 2017-11-01 DIAGNOSIS — L03032 Cellulitis of left toe: Secondary | ICD-10-CM | POA: Diagnosis not present

## 2017-11-01 DIAGNOSIS — L600 Ingrowing nail: Secondary | ICD-10-CM | POA: Diagnosis not present

## 2017-11-01 DIAGNOSIS — M79672 Pain in left foot: Secondary | ICD-10-CM | POA: Diagnosis not present

## 2017-11-01 DIAGNOSIS — M79675 Pain in left toe(s): Secondary | ICD-10-CM | POA: Diagnosis not present

## 2017-11-03 DIAGNOSIS — F419 Anxiety disorder, unspecified: Secondary | ICD-10-CM | POA: Diagnosis not present

## 2017-11-03 DIAGNOSIS — F3132 Bipolar disorder, current episode depressed, moderate: Secondary | ICD-10-CM | POA: Diagnosis not present

## 2017-11-09 DIAGNOSIS — M79672 Pain in left foot: Secondary | ICD-10-CM | POA: Diagnosis not present

## 2017-11-09 DIAGNOSIS — L309 Dermatitis, unspecified: Secondary | ICD-10-CM | POA: Diagnosis not present

## 2017-11-09 DIAGNOSIS — J449 Chronic obstructive pulmonary disease, unspecified: Secondary | ICD-10-CM | POA: Diagnosis not present

## 2017-11-09 DIAGNOSIS — K210 Gastro-esophageal reflux disease with esophagitis: Secondary | ICD-10-CM | POA: Diagnosis not present

## 2017-11-10 DIAGNOSIS — M47816 Spondylosis without myelopathy or radiculopathy, lumbar region: Secondary | ICD-10-CM | POA: Diagnosis not present

## 2017-11-10 DIAGNOSIS — Z79899 Other long term (current) drug therapy: Secondary | ICD-10-CM | POA: Diagnosis not present

## 2017-11-10 DIAGNOSIS — M542 Cervicalgia: Secondary | ICD-10-CM | POA: Diagnosis not present

## 2017-11-10 DIAGNOSIS — M79601 Pain in right arm: Secondary | ICD-10-CM | POA: Diagnosis not present

## 2017-11-10 DIAGNOSIS — Z79891 Long term (current) use of opiate analgesic: Secondary | ICD-10-CM | POA: Diagnosis not present

## 2017-11-10 DIAGNOSIS — G894 Chronic pain syndrome: Secondary | ICD-10-CM | POA: Diagnosis not present

## 2017-11-16 DIAGNOSIS — M25469 Effusion, unspecified knee: Secondary | ICD-10-CM | POA: Diagnosis not present

## 2017-11-16 DIAGNOSIS — M25569 Pain in unspecified knee: Secondary | ICD-10-CM | POA: Diagnosis not present

## 2017-11-17 DIAGNOSIS — F3132 Bipolar disorder, current episode depressed, moderate: Secondary | ICD-10-CM | POA: Diagnosis not present

## 2017-11-17 DIAGNOSIS — F419 Anxiety disorder, unspecified: Secondary | ICD-10-CM | POA: Diagnosis not present

## 2017-11-20 DIAGNOSIS — M25569 Pain in unspecified knee: Secondary | ICD-10-CM | POA: Diagnosis not present

## 2017-11-20 DIAGNOSIS — M179 Osteoarthritis of knee, unspecified: Secondary | ICD-10-CM | POA: Diagnosis not present

## 2017-11-23 DIAGNOSIS — G5750 Tarsal tunnel syndrome, unspecified lower limb: Secondary | ICD-10-CM | POA: Diagnosis not present

## 2017-11-23 DIAGNOSIS — M79672 Pain in left foot: Secondary | ICD-10-CM | POA: Diagnosis not present

## 2017-11-28 DIAGNOSIS — J449 Chronic obstructive pulmonary disease, unspecified: Secondary | ICD-10-CM | POA: Diagnosis not present

## 2017-11-28 DIAGNOSIS — J069 Acute upper respiratory infection, unspecified: Secondary | ICD-10-CM | POA: Diagnosis not present

## 2017-11-28 DIAGNOSIS — M170 Bilateral primary osteoarthritis of knee: Secondary | ICD-10-CM | POA: Diagnosis not present

## 2017-11-28 DIAGNOSIS — G8929 Other chronic pain: Secondary | ICD-10-CM | POA: Diagnosis not present

## 2017-11-28 DIAGNOSIS — Z299 Encounter for prophylactic measures, unspecified: Secondary | ICD-10-CM | POA: Diagnosis not present

## 2017-11-28 DIAGNOSIS — Z6832 Body mass index (BMI) 32.0-32.9, adult: Secondary | ICD-10-CM | POA: Diagnosis not present

## 2017-11-28 DIAGNOSIS — I10 Essential (primary) hypertension: Secondary | ICD-10-CM | POA: Diagnosis not present

## 2017-11-28 DIAGNOSIS — E785 Hyperlipidemia, unspecified: Secondary | ICD-10-CM | POA: Diagnosis not present

## 2017-11-28 DIAGNOSIS — F319 Bipolar disorder, unspecified: Secondary | ICD-10-CM | POA: Diagnosis not present

## 2017-11-29 DIAGNOSIS — G5750 Tarsal tunnel syndrome, unspecified lower limb: Secondary | ICD-10-CM | POA: Diagnosis not present

## 2017-11-29 DIAGNOSIS — M79672 Pain in left foot: Secondary | ICD-10-CM | POA: Diagnosis not present

## 2017-12-04 DIAGNOSIS — Z800 Family history of malignant neoplasm of digestive organs: Secondary | ICD-10-CM | POA: Diagnosis not present

## 2017-12-04 DIAGNOSIS — Z802 Family history of malignant neoplasm of other respiratory and intrathoracic organs: Secondary | ICD-10-CM | POA: Diagnosis not present

## 2017-12-04 DIAGNOSIS — Z808 Family history of malignant neoplasm of other organs or systems: Secondary | ICD-10-CM | POA: Diagnosis not present

## 2017-12-04 DIAGNOSIS — Z8042 Family history of malignant neoplasm of prostate: Secondary | ICD-10-CM | POA: Diagnosis not present

## 2017-12-04 DIAGNOSIS — Z801 Family history of malignant neoplasm of trachea, bronchus and lung: Secondary | ICD-10-CM | POA: Diagnosis not present

## 2017-12-04 DIAGNOSIS — Z1371 Encounter for nonprocreative screening for genetic disease carrier status: Secondary | ICD-10-CM | POA: Diagnosis not present

## 2017-12-08 DIAGNOSIS — Z789 Other specified health status: Secondary | ICD-10-CM | POA: Diagnosis not present

## 2017-12-08 DIAGNOSIS — Z6832 Body mass index (BMI) 32.0-32.9, adult: Secondary | ICD-10-CM | POA: Diagnosis not present

## 2017-12-08 DIAGNOSIS — Z299 Encounter for prophylactic measures, unspecified: Secondary | ICD-10-CM | POA: Diagnosis not present

## 2017-12-08 DIAGNOSIS — J449 Chronic obstructive pulmonary disease, unspecified: Secondary | ICD-10-CM | POA: Diagnosis not present

## 2017-12-08 DIAGNOSIS — F319 Bipolar disorder, unspecified: Secondary | ICD-10-CM | POA: Diagnosis not present

## 2017-12-08 DIAGNOSIS — E785 Hyperlipidemia, unspecified: Secondary | ICD-10-CM | POA: Diagnosis not present

## 2017-12-08 DIAGNOSIS — I10 Essential (primary) hypertension: Secondary | ICD-10-CM | POA: Diagnosis not present

## 2017-12-08 DIAGNOSIS — G8929 Other chronic pain: Secondary | ICD-10-CM | POA: Diagnosis not present

## 2017-12-08 DIAGNOSIS — J069 Acute upper respiratory infection, unspecified: Secondary | ICD-10-CM | POA: Diagnosis not present

## 2017-12-11 DIAGNOSIS — M5136 Other intervertebral disc degeneration, lumbar region: Secondary | ICD-10-CM | POA: Diagnosis not present

## 2017-12-11 DIAGNOSIS — M47816 Spondylosis without myelopathy or radiculopathy, lumbar region: Secondary | ICD-10-CM | POA: Diagnosis not present

## 2017-12-11 DIAGNOSIS — Z79891 Long term (current) use of opiate analgesic: Secondary | ICD-10-CM | POA: Diagnosis not present

## 2017-12-11 DIAGNOSIS — G894 Chronic pain syndrome: Secondary | ICD-10-CM | POA: Diagnosis not present

## 2017-12-11 DIAGNOSIS — M47817 Spondylosis without myelopathy or radiculopathy, lumbosacral region: Secondary | ICD-10-CM | POA: Diagnosis not present

## 2017-12-11 DIAGNOSIS — M25469 Effusion, unspecified knee: Secondary | ICD-10-CM | POA: Diagnosis not present

## 2017-12-11 DIAGNOSIS — Z79899 Other long term (current) drug therapy: Secondary | ICD-10-CM | POA: Diagnosis not present

## 2017-12-14 DIAGNOSIS — G5750 Tarsal tunnel syndrome, unspecified lower limb: Secondary | ICD-10-CM | POA: Diagnosis not present

## 2017-12-14 DIAGNOSIS — M79675 Pain in left toe(s): Secondary | ICD-10-CM | POA: Diagnosis not present

## 2017-12-19 DIAGNOSIS — F4542 Pain disorder with related psychological factors: Secondary | ICD-10-CM | POA: Diagnosis not present

## 2017-12-19 DIAGNOSIS — M792 Neuralgia and neuritis, unspecified: Secondary | ICD-10-CM | POA: Diagnosis not present

## 2017-12-19 DIAGNOSIS — G609 Hereditary and idiopathic neuropathy, unspecified: Secondary | ICD-10-CM | POA: Diagnosis not present

## 2017-12-19 DIAGNOSIS — G894 Chronic pain syndrome: Secondary | ICD-10-CM | POA: Diagnosis not present

## 2017-12-27 DIAGNOSIS — M6281 Muscle weakness (generalized): Secondary | ICD-10-CM | POA: Diagnosis not present

## 2017-12-27 DIAGNOSIS — F319 Bipolar disorder, unspecified: Secondary | ICD-10-CM | POA: Diagnosis not present

## 2017-12-27 DIAGNOSIS — R2689 Other abnormalities of gait and mobility: Secondary | ICD-10-CM | POA: Diagnosis not present

## 2017-12-27 DIAGNOSIS — J449 Chronic obstructive pulmonary disease, unspecified: Secondary | ICD-10-CM | POA: Diagnosis not present

## 2017-12-27 DIAGNOSIS — M150 Primary generalized (osteo)arthritis: Secondary | ICD-10-CM | POA: Diagnosis not present

## 2017-12-27 DIAGNOSIS — G8929 Other chronic pain: Secondary | ICD-10-CM | POA: Diagnosis not present

## 2017-12-29 DIAGNOSIS — I10 Essential (primary) hypertension: Secondary | ICD-10-CM | POA: Diagnosis not present

## 2017-12-29 DIAGNOSIS — G8929 Other chronic pain: Secondary | ICD-10-CM | POA: Diagnosis not present

## 2017-12-29 DIAGNOSIS — J449 Chronic obstructive pulmonary disease, unspecified: Secondary | ICD-10-CM | POA: Diagnosis not present

## 2017-12-29 DIAGNOSIS — R2689 Other abnormalities of gait and mobility: Secondary | ICD-10-CM | POA: Diagnosis not present

## 2017-12-29 DIAGNOSIS — Z6831 Body mass index (BMI) 31.0-31.9, adult: Secondary | ICD-10-CM | POA: Diagnosis not present

## 2017-12-29 DIAGNOSIS — M150 Primary generalized (osteo)arthritis: Secondary | ICD-10-CM | POA: Diagnosis not present

## 2017-12-29 DIAGNOSIS — F319 Bipolar disorder, unspecified: Secondary | ICD-10-CM | POA: Diagnosis not present

## 2017-12-29 DIAGNOSIS — Z299 Encounter for prophylactic measures, unspecified: Secondary | ICD-10-CM | POA: Diagnosis not present

## 2017-12-29 DIAGNOSIS — M6281 Muscle weakness (generalized): Secondary | ICD-10-CM | POA: Diagnosis not present

## 2018-01-01 DIAGNOSIS — R2689 Other abnormalities of gait and mobility: Secondary | ICD-10-CM | POA: Diagnosis not present

## 2018-01-01 DIAGNOSIS — M150 Primary generalized (osteo)arthritis: Secondary | ICD-10-CM | POA: Diagnosis not present

## 2018-01-01 DIAGNOSIS — M6281 Muscle weakness (generalized): Secondary | ICD-10-CM | POA: Diagnosis not present

## 2018-01-01 DIAGNOSIS — F319 Bipolar disorder, unspecified: Secondary | ICD-10-CM | POA: Diagnosis not present

## 2018-01-01 DIAGNOSIS — J449 Chronic obstructive pulmonary disease, unspecified: Secondary | ICD-10-CM | POA: Diagnosis not present

## 2018-01-01 DIAGNOSIS — G8929 Other chronic pain: Secondary | ICD-10-CM | POA: Diagnosis not present

## 2018-01-03 DIAGNOSIS — R2689 Other abnormalities of gait and mobility: Secondary | ICD-10-CM | POA: Diagnosis not present

## 2018-01-03 DIAGNOSIS — M150 Primary generalized (osteo)arthritis: Secondary | ICD-10-CM | POA: Diagnosis not present

## 2018-01-03 DIAGNOSIS — G8929 Other chronic pain: Secondary | ICD-10-CM | POA: Diagnosis not present

## 2018-01-03 DIAGNOSIS — J449 Chronic obstructive pulmonary disease, unspecified: Secondary | ICD-10-CM | POA: Diagnosis not present

## 2018-01-03 DIAGNOSIS — F319 Bipolar disorder, unspecified: Secondary | ICD-10-CM | POA: Diagnosis not present

## 2018-01-03 DIAGNOSIS — M6281 Muscle weakness (generalized): Secondary | ICD-10-CM | POA: Diagnosis not present

## 2018-01-04 DIAGNOSIS — R2689 Other abnormalities of gait and mobility: Secondary | ICD-10-CM | POA: Diagnosis not present

## 2018-01-04 DIAGNOSIS — F319 Bipolar disorder, unspecified: Secondary | ICD-10-CM | POA: Diagnosis not present

## 2018-01-04 DIAGNOSIS — M150 Primary generalized (osteo)arthritis: Secondary | ICD-10-CM | POA: Diagnosis not present

## 2018-01-04 DIAGNOSIS — J449 Chronic obstructive pulmonary disease, unspecified: Secondary | ICD-10-CM | POA: Diagnosis not present

## 2018-01-04 DIAGNOSIS — G8929 Other chronic pain: Secondary | ICD-10-CM | POA: Diagnosis not present

## 2018-01-04 DIAGNOSIS — M6281 Muscle weakness (generalized): Secondary | ICD-10-CM | POA: Diagnosis not present

## 2018-01-05 DIAGNOSIS — M150 Primary generalized (osteo)arthritis: Secondary | ICD-10-CM | POA: Diagnosis not present

## 2018-01-05 DIAGNOSIS — G8929 Other chronic pain: Secondary | ICD-10-CM | POA: Diagnosis not present

## 2018-01-05 DIAGNOSIS — J449 Chronic obstructive pulmonary disease, unspecified: Secondary | ICD-10-CM | POA: Diagnosis not present

## 2018-01-05 DIAGNOSIS — F319 Bipolar disorder, unspecified: Secondary | ICD-10-CM | POA: Diagnosis not present

## 2018-01-05 DIAGNOSIS — R2689 Other abnormalities of gait and mobility: Secondary | ICD-10-CM | POA: Diagnosis not present

## 2018-01-05 DIAGNOSIS — M6281 Muscle weakness (generalized): Secondary | ICD-10-CM | POA: Diagnosis not present

## 2018-01-08 DIAGNOSIS — Z79899 Other long term (current) drug therapy: Secondary | ICD-10-CM | POA: Diagnosis not present

## 2018-01-08 DIAGNOSIS — G894 Chronic pain syndrome: Secondary | ICD-10-CM | POA: Diagnosis not present

## 2018-01-08 DIAGNOSIS — M419 Scoliosis, unspecified: Secondary | ICD-10-CM | POA: Diagnosis not present

## 2018-01-08 DIAGNOSIS — Z79891 Long term (current) use of opiate analgesic: Secondary | ICD-10-CM | POA: Diagnosis not present

## 2018-01-08 DIAGNOSIS — M542 Cervicalgia: Secondary | ICD-10-CM | POA: Diagnosis not present

## 2018-01-08 DIAGNOSIS — M47817 Spondylosis without myelopathy or radiculopathy, lumbosacral region: Secondary | ICD-10-CM | POA: Diagnosis not present

## 2018-01-08 DIAGNOSIS — M5136 Other intervertebral disc degeneration, lumbar region: Secondary | ICD-10-CM | POA: Diagnosis not present

## 2018-01-09 ENCOUNTER — Other Ambulatory Visit: Payer: Self-pay | Admitting: Pain Medicine

## 2018-01-09 DIAGNOSIS — R2689 Other abnormalities of gait and mobility: Secondary | ICD-10-CM | POA: Diagnosis not present

## 2018-01-09 DIAGNOSIS — G8929 Other chronic pain: Secondary | ICD-10-CM | POA: Diagnosis not present

## 2018-01-09 DIAGNOSIS — M25511 Pain in right shoulder: Secondary | ICD-10-CM

## 2018-01-09 DIAGNOSIS — G8918 Other acute postprocedural pain: Secondary | ICD-10-CM

## 2018-01-09 DIAGNOSIS — F319 Bipolar disorder, unspecified: Secondary | ICD-10-CM | POA: Diagnosis not present

## 2018-01-09 DIAGNOSIS — M150 Primary generalized (osteo)arthritis: Secondary | ICD-10-CM | POA: Diagnosis not present

## 2018-01-09 DIAGNOSIS — J449 Chronic obstructive pulmonary disease, unspecified: Secondary | ICD-10-CM | POA: Diagnosis not present

## 2018-01-09 DIAGNOSIS — M6281 Muscle weakness (generalized): Secondary | ICD-10-CM | POA: Diagnosis not present

## 2018-01-10 DIAGNOSIS — R2689 Other abnormalities of gait and mobility: Secondary | ICD-10-CM | POA: Diagnosis not present

## 2018-01-10 DIAGNOSIS — M150 Primary generalized (osteo)arthritis: Secondary | ICD-10-CM | POA: Diagnosis not present

## 2018-01-10 DIAGNOSIS — M6281 Muscle weakness (generalized): Secondary | ICD-10-CM | POA: Diagnosis not present

## 2018-01-10 DIAGNOSIS — F319 Bipolar disorder, unspecified: Secondary | ICD-10-CM | POA: Diagnosis not present

## 2018-01-10 DIAGNOSIS — G8929 Other chronic pain: Secondary | ICD-10-CM | POA: Diagnosis not present

## 2018-01-10 DIAGNOSIS — J449 Chronic obstructive pulmonary disease, unspecified: Secondary | ICD-10-CM | POA: Diagnosis not present

## 2018-01-11 DIAGNOSIS — M79672 Pain in left foot: Secondary | ICD-10-CM | POA: Diagnosis not present

## 2018-01-11 DIAGNOSIS — G5750 Tarsal tunnel syndrome, unspecified lower limb: Secondary | ICD-10-CM | POA: Diagnosis not present

## 2018-01-11 DIAGNOSIS — J449 Chronic obstructive pulmonary disease, unspecified: Secondary | ICD-10-CM | POA: Diagnosis not present

## 2018-01-11 DIAGNOSIS — M150 Primary generalized (osteo)arthritis: Secondary | ICD-10-CM | POA: Diagnosis not present

## 2018-01-11 DIAGNOSIS — M6281 Muscle weakness (generalized): Secondary | ICD-10-CM | POA: Diagnosis not present

## 2018-01-11 DIAGNOSIS — G8929 Other chronic pain: Secondary | ICD-10-CM | POA: Diagnosis not present

## 2018-01-11 DIAGNOSIS — R2689 Other abnormalities of gait and mobility: Secondary | ICD-10-CM | POA: Diagnosis not present

## 2018-01-11 DIAGNOSIS — F319 Bipolar disorder, unspecified: Secondary | ICD-10-CM | POA: Diagnosis not present

## 2018-01-16 DIAGNOSIS — G8929 Other chronic pain: Secondary | ICD-10-CM | POA: Diagnosis not present

## 2018-01-16 DIAGNOSIS — R2689 Other abnormalities of gait and mobility: Secondary | ICD-10-CM | POA: Diagnosis not present

## 2018-01-16 DIAGNOSIS — M150 Primary generalized (osteo)arthritis: Secondary | ICD-10-CM | POA: Diagnosis not present

## 2018-01-16 DIAGNOSIS — M6281 Muscle weakness (generalized): Secondary | ICD-10-CM | POA: Diagnosis not present

## 2018-01-16 DIAGNOSIS — F319 Bipolar disorder, unspecified: Secondary | ICD-10-CM | POA: Diagnosis not present

## 2018-01-16 DIAGNOSIS — J449 Chronic obstructive pulmonary disease, unspecified: Secondary | ICD-10-CM | POA: Diagnosis not present

## 2018-01-17 DIAGNOSIS — M79674 Pain in right toe(s): Secondary | ICD-10-CM | POA: Diagnosis not present

## 2018-01-17 DIAGNOSIS — L600 Ingrowing nail: Secondary | ICD-10-CM | POA: Diagnosis not present

## 2018-01-18 DIAGNOSIS — F319 Bipolar disorder, unspecified: Secondary | ICD-10-CM | POA: Diagnosis not present

## 2018-01-18 DIAGNOSIS — M6281 Muscle weakness (generalized): Secondary | ICD-10-CM | POA: Diagnosis not present

## 2018-01-18 DIAGNOSIS — M150 Primary generalized (osteo)arthritis: Secondary | ICD-10-CM | POA: Diagnosis not present

## 2018-01-18 DIAGNOSIS — R2689 Other abnormalities of gait and mobility: Secondary | ICD-10-CM | POA: Diagnosis not present

## 2018-01-18 DIAGNOSIS — J449 Chronic obstructive pulmonary disease, unspecified: Secondary | ICD-10-CM | POA: Diagnosis not present

## 2018-01-18 DIAGNOSIS — G8929 Other chronic pain: Secondary | ICD-10-CM | POA: Diagnosis not present

## 2018-01-22 DIAGNOSIS — M47817 Spondylosis without myelopathy or radiculopathy, lumbosacral region: Secondary | ICD-10-CM | POA: Diagnosis not present

## 2018-01-23 DIAGNOSIS — F319 Bipolar disorder, unspecified: Secondary | ICD-10-CM | POA: Diagnosis not present

## 2018-01-23 DIAGNOSIS — R2689 Other abnormalities of gait and mobility: Secondary | ICD-10-CM | POA: Diagnosis not present

## 2018-01-23 DIAGNOSIS — M6281 Muscle weakness (generalized): Secondary | ICD-10-CM | POA: Diagnosis not present

## 2018-01-23 DIAGNOSIS — M150 Primary generalized (osteo)arthritis: Secondary | ICD-10-CM | POA: Diagnosis not present

## 2018-01-23 DIAGNOSIS — J449 Chronic obstructive pulmonary disease, unspecified: Secondary | ICD-10-CM | POA: Diagnosis not present

## 2018-01-23 DIAGNOSIS — G8929 Other chronic pain: Secondary | ICD-10-CM | POA: Diagnosis not present

## 2018-01-24 DIAGNOSIS — D1800 Hemangioma unspecified site: Secondary | ICD-10-CM | POA: Diagnosis not present

## 2018-01-24 DIAGNOSIS — L280 Lichen simplex chronicus: Secondary | ICD-10-CM | POA: Diagnosis not present

## 2018-01-25 DIAGNOSIS — F319 Bipolar disorder, unspecified: Secondary | ICD-10-CM | POA: Diagnosis not present

## 2018-01-25 DIAGNOSIS — J449 Chronic obstructive pulmonary disease, unspecified: Secondary | ICD-10-CM | POA: Diagnosis not present

## 2018-01-25 DIAGNOSIS — G8929 Other chronic pain: Secondary | ICD-10-CM | POA: Diagnosis not present

## 2018-01-25 DIAGNOSIS — M150 Primary generalized (osteo)arthritis: Secondary | ICD-10-CM | POA: Diagnosis not present

## 2018-01-25 DIAGNOSIS — M6281 Muscle weakness (generalized): Secondary | ICD-10-CM | POA: Diagnosis not present

## 2018-01-25 DIAGNOSIS — R2689 Other abnormalities of gait and mobility: Secondary | ICD-10-CM | POA: Diagnosis not present

## 2018-01-30 DIAGNOSIS — M6281 Muscle weakness (generalized): Secondary | ICD-10-CM | POA: Diagnosis not present

## 2018-01-30 DIAGNOSIS — R2689 Other abnormalities of gait and mobility: Secondary | ICD-10-CM | POA: Diagnosis not present

## 2018-01-30 DIAGNOSIS — M150 Primary generalized (osteo)arthritis: Secondary | ICD-10-CM | POA: Diagnosis not present

## 2018-01-30 DIAGNOSIS — F319 Bipolar disorder, unspecified: Secondary | ICD-10-CM | POA: Diagnosis not present

## 2018-01-30 DIAGNOSIS — G8929 Other chronic pain: Secondary | ICD-10-CM | POA: Diagnosis not present

## 2018-01-30 DIAGNOSIS — J449 Chronic obstructive pulmonary disease, unspecified: Secondary | ICD-10-CM | POA: Diagnosis not present

## 2018-01-31 DIAGNOSIS — J449 Chronic obstructive pulmonary disease, unspecified: Secondary | ICD-10-CM | POA: Diagnosis not present

## 2018-01-31 DIAGNOSIS — F319 Bipolar disorder, unspecified: Secondary | ICD-10-CM | POA: Diagnosis not present

## 2018-01-31 DIAGNOSIS — R2689 Other abnormalities of gait and mobility: Secondary | ICD-10-CM | POA: Diagnosis not present

## 2018-01-31 DIAGNOSIS — M6281 Muscle weakness (generalized): Secondary | ICD-10-CM | POA: Diagnosis not present

## 2018-01-31 DIAGNOSIS — M150 Primary generalized (osteo)arthritis: Secondary | ICD-10-CM | POA: Diagnosis not present

## 2018-01-31 DIAGNOSIS — G8929 Other chronic pain: Secondary | ICD-10-CM | POA: Diagnosis not present

## 2018-02-01 DIAGNOSIS — F319 Bipolar disorder, unspecified: Secondary | ICD-10-CM | POA: Diagnosis not present

## 2018-02-01 DIAGNOSIS — J449 Chronic obstructive pulmonary disease, unspecified: Secondary | ICD-10-CM | POA: Diagnosis not present

## 2018-02-01 DIAGNOSIS — M6281 Muscle weakness (generalized): Secondary | ICD-10-CM | POA: Diagnosis not present

## 2018-02-01 DIAGNOSIS — M150 Primary generalized (osteo)arthritis: Secondary | ICD-10-CM | POA: Diagnosis not present

## 2018-02-01 DIAGNOSIS — R2689 Other abnormalities of gait and mobility: Secondary | ICD-10-CM | POA: Diagnosis not present

## 2018-02-01 DIAGNOSIS — G8929 Other chronic pain: Secondary | ICD-10-CM | POA: Diagnosis not present

## 2018-02-09 DIAGNOSIS — G8929 Other chronic pain: Secondary | ICD-10-CM | POA: Diagnosis not present

## 2018-02-09 DIAGNOSIS — J449 Chronic obstructive pulmonary disease, unspecified: Secondary | ICD-10-CM | POA: Diagnosis not present

## 2018-02-09 DIAGNOSIS — F319 Bipolar disorder, unspecified: Secondary | ICD-10-CM | POA: Diagnosis not present

## 2018-02-09 DIAGNOSIS — R2689 Other abnormalities of gait and mobility: Secondary | ICD-10-CM | POA: Diagnosis not present

## 2018-02-09 DIAGNOSIS — M150 Primary generalized (osteo)arthritis: Secondary | ICD-10-CM | POA: Diagnosis not present

## 2018-02-09 DIAGNOSIS — M6281 Muscle weakness (generalized): Secondary | ICD-10-CM | POA: Diagnosis not present

## 2018-02-13 DIAGNOSIS — R2689 Other abnormalities of gait and mobility: Secondary | ICD-10-CM | POA: Diagnosis not present

## 2018-02-13 DIAGNOSIS — F319 Bipolar disorder, unspecified: Secondary | ICD-10-CM | POA: Diagnosis not present

## 2018-02-13 DIAGNOSIS — J449 Chronic obstructive pulmonary disease, unspecified: Secondary | ICD-10-CM | POA: Diagnosis not present

## 2018-02-13 DIAGNOSIS — M6281 Muscle weakness (generalized): Secondary | ICD-10-CM | POA: Diagnosis not present

## 2018-02-13 DIAGNOSIS — M150 Primary generalized (osteo)arthritis: Secondary | ICD-10-CM | POA: Diagnosis not present

## 2018-02-13 DIAGNOSIS — G8929 Other chronic pain: Secondary | ICD-10-CM | POA: Diagnosis not present

## 2018-02-14 DIAGNOSIS — M6281 Muscle weakness (generalized): Secondary | ICD-10-CM | POA: Diagnosis not present

## 2018-02-14 DIAGNOSIS — G8929 Other chronic pain: Secondary | ICD-10-CM | POA: Diagnosis not present

## 2018-02-14 DIAGNOSIS — F319 Bipolar disorder, unspecified: Secondary | ICD-10-CM | POA: Diagnosis not present

## 2018-02-14 DIAGNOSIS — M150 Primary generalized (osteo)arthritis: Secondary | ICD-10-CM | POA: Diagnosis not present

## 2018-02-14 DIAGNOSIS — R2689 Other abnormalities of gait and mobility: Secondary | ICD-10-CM | POA: Diagnosis not present

## 2018-02-14 DIAGNOSIS — J449 Chronic obstructive pulmonary disease, unspecified: Secondary | ICD-10-CM | POA: Diagnosis not present

## 2018-02-15 DIAGNOSIS — L600 Ingrowing nail: Secondary | ICD-10-CM | POA: Diagnosis not present

## 2018-02-15 DIAGNOSIS — G5750 Tarsal tunnel syndrome, unspecified lower limb: Secondary | ICD-10-CM | POA: Diagnosis not present

## 2018-02-15 DIAGNOSIS — L03031 Cellulitis of right toe: Secondary | ICD-10-CM | POA: Diagnosis not present

## 2018-02-15 DIAGNOSIS — M79671 Pain in right foot: Secondary | ICD-10-CM | POA: Diagnosis not present

## 2018-02-21 DIAGNOSIS — M150 Primary generalized (osteo)arthritis: Secondary | ICD-10-CM | POA: Diagnosis not present

## 2018-02-21 DIAGNOSIS — J449 Chronic obstructive pulmonary disease, unspecified: Secondary | ICD-10-CM | POA: Diagnosis not present

## 2018-02-21 DIAGNOSIS — F319 Bipolar disorder, unspecified: Secondary | ICD-10-CM | POA: Diagnosis not present

## 2018-02-21 DIAGNOSIS — R2689 Other abnormalities of gait and mobility: Secondary | ICD-10-CM | POA: Diagnosis not present

## 2018-02-21 DIAGNOSIS — M6281 Muscle weakness (generalized): Secondary | ICD-10-CM | POA: Diagnosis not present

## 2018-02-21 DIAGNOSIS — G8929 Other chronic pain: Secondary | ICD-10-CM | POA: Diagnosis not present

## 2018-02-23 DIAGNOSIS — G894 Chronic pain syndrome: Secondary | ICD-10-CM | POA: Diagnosis not present

## 2018-02-23 DIAGNOSIS — M542 Cervicalgia: Secondary | ICD-10-CM | POA: Diagnosis not present

## 2018-02-23 DIAGNOSIS — M47816 Spondylosis without myelopathy or radiculopathy, lumbar region: Secondary | ICD-10-CM | POA: Diagnosis not present

## 2018-02-23 DIAGNOSIS — M419 Scoliosis, unspecified: Secondary | ICD-10-CM | POA: Diagnosis not present

## 2018-02-25 DIAGNOSIS — M6281 Muscle weakness (generalized): Secondary | ICD-10-CM | POA: Diagnosis not present

## 2018-02-25 DIAGNOSIS — G8929 Other chronic pain: Secondary | ICD-10-CM | POA: Diagnosis not present

## 2018-02-25 DIAGNOSIS — Z79899 Other long term (current) drug therapy: Secondary | ICD-10-CM | POA: Diagnosis not present

## 2018-02-25 DIAGNOSIS — Z87891 Personal history of nicotine dependence: Secondary | ICD-10-CM | POA: Diagnosis not present

## 2018-02-25 DIAGNOSIS — J449 Chronic obstructive pulmonary disease, unspecified: Secondary | ICD-10-CM | POA: Diagnosis not present

## 2018-02-25 DIAGNOSIS — M549 Dorsalgia, unspecified: Secondary | ICD-10-CM | POA: Diagnosis not present

## 2018-02-25 DIAGNOSIS — N3001 Acute cystitis with hematuria: Secondary | ICD-10-CM | POA: Diagnosis not present

## 2018-02-25 DIAGNOSIS — R202 Paresthesia of skin: Secondary | ICD-10-CM | POA: Diagnosis not present

## 2018-02-25 DIAGNOSIS — Z79891 Long term (current) use of opiate analgesic: Secondary | ICD-10-CM | POA: Diagnosis not present

## 2018-02-25 DIAGNOSIS — W19XXXA Unspecified fall, initial encounter: Secondary | ICD-10-CM | POA: Diagnosis not present

## 2018-02-25 DIAGNOSIS — M545 Low back pain: Secondary | ICD-10-CM | POA: Diagnosis not present

## 2018-02-25 DIAGNOSIS — M150 Primary generalized (osteo)arthritis: Secondary | ICD-10-CM | POA: Diagnosis not present

## 2018-02-25 DIAGNOSIS — R52 Pain, unspecified: Secondary | ICD-10-CM | POA: Diagnosis not present

## 2018-02-25 DIAGNOSIS — I10 Essential (primary) hypertension: Secondary | ICD-10-CM | POA: Diagnosis not present

## 2018-02-25 DIAGNOSIS — F319 Bipolar disorder, unspecified: Secondary | ICD-10-CM | POA: Diagnosis not present

## 2018-02-25 DIAGNOSIS — M810 Age-related osteoporosis without current pathological fracture: Secondary | ICD-10-CM | POA: Diagnosis not present

## 2018-02-25 DIAGNOSIS — N3000 Acute cystitis without hematuria: Secondary | ICD-10-CM | POA: Diagnosis not present

## 2018-02-25 DIAGNOSIS — M5489 Other dorsalgia: Secondary | ICD-10-CM | POA: Diagnosis not present

## 2018-02-25 DIAGNOSIS — K219 Gastro-esophageal reflux disease without esophagitis: Secondary | ICD-10-CM | POA: Diagnosis not present

## 2018-02-26 DIAGNOSIS — Z743 Need for continuous supervision: Secondary | ICD-10-CM | POA: Diagnosis not present

## 2018-02-26 DIAGNOSIS — R279 Unspecified lack of coordination: Secondary | ICD-10-CM | POA: Diagnosis not present

## 2018-02-27 DIAGNOSIS — F319 Bipolar disorder, unspecified: Secondary | ICD-10-CM | POA: Diagnosis not present

## 2018-02-27 DIAGNOSIS — I10 Essential (primary) hypertension: Secondary | ICD-10-CM | POA: Diagnosis not present

## 2018-02-27 DIAGNOSIS — M150 Primary generalized (osteo)arthritis: Secondary | ICD-10-CM | POA: Diagnosis not present

## 2018-02-27 DIAGNOSIS — G8929 Other chronic pain: Secondary | ICD-10-CM | POA: Diagnosis not present

## 2018-02-27 DIAGNOSIS — M6281 Muscle weakness (generalized): Secondary | ICD-10-CM | POA: Diagnosis not present

## 2018-02-27 DIAGNOSIS — J449 Chronic obstructive pulmonary disease, unspecified: Secondary | ICD-10-CM | POA: Diagnosis not present

## 2018-03-01 DIAGNOSIS — J449 Chronic obstructive pulmonary disease, unspecified: Secondary | ICD-10-CM | POA: Diagnosis not present

## 2018-03-01 DIAGNOSIS — N390 Urinary tract infection, site not specified: Secondary | ICD-10-CM | POA: Diagnosis not present

## 2018-03-01 DIAGNOSIS — F319 Bipolar disorder, unspecified: Secondary | ICD-10-CM | POA: Diagnosis not present

## 2018-03-01 DIAGNOSIS — Z299 Encounter for prophylactic measures, unspecified: Secondary | ICD-10-CM | POA: Diagnosis not present

## 2018-03-01 DIAGNOSIS — I10 Essential (primary) hypertension: Secondary | ICD-10-CM | POA: Diagnosis not present

## 2018-03-01 DIAGNOSIS — R5383 Other fatigue: Secondary | ICD-10-CM | POA: Diagnosis not present

## 2018-03-01 DIAGNOSIS — M549 Dorsalgia, unspecified: Secondary | ICD-10-CM | POA: Diagnosis not present

## 2018-03-01 DIAGNOSIS — Z6833 Body mass index (BMI) 33.0-33.9, adult: Secondary | ICD-10-CM | POA: Diagnosis not present

## 2018-03-01 DIAGNOSIS — E559 Vitamin D deficiency, unspecified: Secondary | ICD-10-CM | POA: Diagnosis not present

## 2018-03-05 DIAGNOSIS — R5383 Other fatigue: Secondary | ICD-10-CM | POA: Diagnosis not present

## 2018-03-05 DIAGNOSIS — E559 Vitamin D deficiency, unspecified: Secondary | ICD-10-CM | POA: Diagnosis not present

## 2018-03-05 DIAGNOSIS — I10 Essential (primary) hypertension: Secondary | ICD-10-CM | POA: Diagnosis not present

## 2018-03-05 DIAGNOSIS — Z79899 Other long term (current) drug therapy: Secondary | ICD-10-CM | POA: Diagnosis not present

## 2018-03-06 DIAGNOSIS — F319 Bipolar disorder, unspecified: Secondary | ICD-10-CM | POA: Diagnosis not present

## 2018-03-06 DIAGNOSIS — G8929 Other chronic pain: Secondary | ICD-10-CM | POA: Diagnosis not present

## 2018-03-06 DIAGNOSIS — M150 Primary generalized (osteo)arthritis: Secondary | ICD-10-CM | POA: Diagnosis not present

## 2018-03-06 DIAGNOSIS — I10 Essential (primary) hypertension: Secondary | ICD-10-CM | POA: Diagnosis not present

## 2018-03-06 DIAGNOSIS — J449 Chronic obstructive pulmonary disease, unspecified: Secondary | ICD-10-CM | POA: Diagnosis not present

## 2018-03-06 DIAGNOSIS — M6281 Muscle weakness (generalized): Secondary | ICD-10-CM | POA: Diagnosis not present

## 2018-03-14 DIAGNOSIS — M6281 Muscle weakness (generalized): Secondary | ICD-10-CM | POA: Diagnosis not present

## 2018-03-14 DIAGNOSIS — G8929 Other chronic pain: Secondary | ICD-10-CM | POA: Diagnosis not present

## 2018-03-14 DIAGNOSIS — F319 Bipolar disorder, unspecified: Secondary | ICD-10-CM | POA: Diagnosis not present

## 2018-03-14 DIAGNOSIS — I10 Essential (primary) hypertension: Secondary | ICD-10-CM | POA: Diagnosis not present

## 2018-03-14 DIAGNOSIS — M150 Primary generalized (osteo)arthritis: Secondary | ICD-10-CM | POA: Diagnosis not present

## 2018-03-14 DIAGNOSIS — J449 Chronic obstructive pulmonary disease, unspecified: Secondary | ICD-10-CM | POA: Diagnosis not present

## 2018-03-23 DIAGNOSIS — M150 Primary generalized (osteo)arthritis: Secondary | ICD-10-CM | POA: Diagnosis not present

## 2018-03-23 DIAGNOSIS — I10 Essential (primary) hypertension: Secondary | ICD-10-CM | POA: Diagnosis not present

## 2018-03-23 DIAGNOSIS — M6281 Muscle weakness (generalized): Secondary | ICD-10-CM | POA: Diagnosis not present

## 2018-03-23 DIAGNOSIS — Z299 Encounter for prophylactic measures, unspecified: Secondary | ICD-10-CM | POA: Diagnosis not present

## 2018-03-23 DIAGNOSIS — J449 Chronic obstructive pulmonary disease, unspecified: Secondary | ICD-10-CM | POA: Diagnosis not present

## 2018-03-23 DIAGNOSIS — K5900 Constipation, unspecified: Secondary | ICD-10-CM | POA: Diagnosis not present

## 2018-03-23 DIAGNOSIS — Z6832 Body mass index (BMI) 32.0-32.9, adult: Secondary | ICD-10-CM | POA: Diagnosis not present

## 2018-03-23 DIAGNOSIS — F319 Bipolar disorder, unspecified: Secondary | ICD-10-CM | POA: Diagnosis not present

## 2018-03-23 DIAGNOSIS — G8929 Other chronic pain: Secondary | ICD-10-CM | POA: Diagnosis not present

## 2018-03-23 DIAGNOSIS — R002 Palpitations: Secondary | ICD-10-CM | POA: Diagnosis not present

## 2018-03-27 DIAGNOSIS — G894 Chronic pain syndrome: Secondary | ICD-10-CM | POA: Diagnosis not present

## 2018-03-27 DIAGNOSIS — Z79899 Other long term (current) drug therapy: Secondary | ICD-10-CM | POA: Diagnosis not present

## 2018-03-27 DIAGNOSIS — Z79891 Long term (current) use of opiate analgesic: Secondary | ICD-10-CM | POA: Diagnosis not present

## 2018-03-27 DIAGNOSIS — M25569 Pain in unspecified knee: Secondary | ICD-10-CM | POA: Diagnosis not present

## 2018-03-27 DIAGNOSIS — M47816 Spondylosis without myelopathy or radiculopathy, lumbar region: Secondary | ICD-10-CM | POA: Diagnosis not present

## 2018-03-28 DIAGNOSIS — M25562 Pain in left knee: Secondary | ICD-10-CM | POA: Diagnosis not present

## 2018-04-02 DIAGNOSIS — M5136 Other intervertebral disc degeneration, lumbar region: Secondary | ICD-10-CM | POA: Diagnosis not present

## 2018-04-02 DIAGNOSIS — M47817 Spondylosis without myelopathy or radiculopathy, lumbosacral region: Secondary | ICD-10-CM | POA: Diagnosis not present

## 2018-04-19 DIAGNOSIS — F319 Bipolar disorder, unspecified: Secondary | ICD-10-CM | POA: Diagnosis not present

## 2018-04-19 DIAGNOSIS — Z6832 Body mass index (BMI) 32.0-32.9, adult: Secondary | ICD-10-CM | POA: Diagnosis not present

## 2018-04-19 DIAGNOSIS — I10 Essential (primary) hypertension: Secondary | ICD-10-CM | POA: Diagnosis not present

## 2018-04-19 DIAGNOSIS — Z299 Encounter for prophylactic measures, unspecified: Secondary | ICD-10-CM | POA: Diagnosis not present

## 2018-04-19 DIAGNOSIS — Z713 Dietary counseling and surveillance: Secondary | ICD-10-CM | POA: Diagnosis not present

## 2018-04-24 DIAGNOSIS — Z79891 Long term (current) use of opiate analgesic: Secondary | ICD-10-CM | POA: Diagnosis not present

## 2018-04-24 DIAGNOSIS — Z79899 Other long term (current) drug therapy: Secondary | ICD-10-CM | POA: Diagnosis not present

## 2018-04-24 DIAGNOSIS — M542 Cervicalgia: Secondary | ICD-10-CM | POA: Diagnosis not present

## 2018-04-24 DIAGNOSIS — M47816 Spondylosis without myelopathy or radiculopathy, lumbar region: Secondary | ICD-10-CM | POA: Diagnosis not present

## 2018-04-24 DIAGNOSIS — G894 Chronic pain syndrome: Secondary | ICD-10-CM | POA: Diagnosis not present

## 2018-04-24 DIAGNOSIS — M25569 Pain in unspecified knee: Secondary | ICD-10-CM | POA: Diagnosis not present

## 2018-04-27 DIAGNOSIS — I959 Hypotension, unspecified: Secondary | ICD-10-CM | POA: Diagnosis not present

## 2018-04-27 DIAGNOSIS — F17210 Nicotine dependence, cigarettes, uncomplicated: Secondary | ICD-10-CM | POA: Diagnosis present

## 2018-04-27 DIAGNOSIS — R52 Pain, unspecified: Secondary | ICD-10-CM | POA: Diagnosis not present

## 2018-04-27 DIAGNOSIS — A419 Sepsis, unspecified organism: Secondary | ICD-10-CM | POA: Diagnosis present

## 2018-04-27 DIAGNOSIS — I10 Essential (primary) hypertension: Secondary | ICD-10-CM | POA: Diagnosis not present

## 2018-04-27 DIAGNOSIS — K5730 Diverticulosis of large intestine without perforation or abscess without bleeding: Secondary | ICD-10-CM | POA: Diagnosis not present

## 2018-04-27 DIAGNOSIS — J449 Chronic obstructive pulmonary disease, unspecified: Secondary | ICD-10-CM | POA: Diagnosis not present

## 2018-04-27 DIAGNOSIS — K5720 Diverticulitis of large intestine with perforation and abscess without bleeding: Secondary | ICD-10-CM | POA: Diagnosis present

## 2018-04-27 DIAGNOSIS — K5792 Diverticulitis of intestine, part unspecified, without perforation or abscess without bleeding: Secondary | ICD-10-CM | POA: Diagnosis not present

## 2018-04-27 DIAGNOSIS — R102 Pelvic and perineal pain: Secondary | ICD-10-CM | POA: Diagnosis not present

## 2018-04-27 DIAGNOSIS — Z79899 Other long term (current) drug therapy: Secondary | ICD-10-CM | POA: Diagnosis not present

## 2018-04-27 DIAGNOSIS — F319 Bipolar disorder, unspecified: Secondary | ICD-10-CM | POA: Diagnosis present

## 2018-05-02 DIAGNOSIS — E559 Vitamin D deficiency, unspecified: Secondary | ICD-10-CM | POA: Diagnosis not present

## 2018-05-02 DIAGNOSIS — G8929 Other chronic pain: Secondary | ICD-10-CM | POA: Diagnosis not present

## 2018-05-02 DIAGNOSIS — K219 Gastro-esophageal reflux disease without esophagitis: Secondary | ICD-10-CM | POA: Diagnosis not present

## 2018-05-02 DIAGNOSIS — I10 Essential (primary) hypertension: Secondary | ICD-10-CM | POA: Diagnosis not present

## 2018-05-02 DIAGNOSIS — I340 Nonrheumatic mitral (valve) insufficiency: Secondary | ICD-10-CM | POA: Diagnosis not present

## 2018-05-02 DIAGNOSIS — N390 Urinary tract infection, site not specified: Secondary | ICD-10-CM | POA: Diagnosis not present

## 2018-05-02 DIAGNOSIS — F319 Bipolar disorder, unspecified: Secondary | ICD-10-CM | POA: Diagnosis not present

## 2018-05-02 DIAGNOSIS — K5780 Diverticulitis of intestine, part unspecified, with perforation and abscess without bleeding: Secondary | ICD-10-CM | POA: Diagnosis not present

## 2018-05-02 DIAGNOSIS — I2699 Other pulmonary embolism without acute cor pulmonale: Secondary | ICD-10-CM | POA: Diagnosis not present

## 2018-05-02 DIAGNOSIS — E785 Hyperlipidemia, unspecified: Secondary | ICD-10-CM | POA: Diagnosis not present

## 2018-05-02 DIAGNOSIS — M810 Age-related osteoporosis without current pathological fracture: Secondary | ICD-10-CM | POA: Diagnosis not present

## 2018-05-02 DIAGNOSIS — J690 Pneumonitis due to inhalation of food and vomit: Secondary | ICD-10-CM | POA: Diagnosis not present

## 2018-05-02 DIAGNOSIS — D649 Anemia, unspecified: Secondary | ICD-10-CM | POA: Diagnosis not present

## 2018-05-02 DIAGNOSIS — J449 Chronic obstructive pulmonary disease, unspecified: Secondary | ICD-10-CM | POA: Diagnosis not present

## 2018-05-03 DIAGNOSIS — N390 Urinary tract infection, site not specified: Secondary | ICD-10-CM | POA: Diagnosis not present

## 2018-05-03 DIAGNOSIS — J690 Pneumonitis due to inhalation of food and vomit: Secondary | ICD-10-CM | POA: Diagnosis not present

## 2018-05-03 DIAGNOSIS — K5780 Diverticulitis of intestine, part unspecified, with perforation and abscess without bleeding: Secondary | ICD-10-CM | POA: Diagnosis not present

## 2018-05-03 DIAGNOSIS — I10 Essential (primary) hypertension: Secondary | ICD-10-CM | POA: Diagnosis not present

## 2018-05-03 DIAGNOSIS — D649 Anemia, unspecified: Secondary | ICD-10-CM | POA: Diagnosis not present

## 2018-05-03 DIAGNOSIS — I2699 Other pulmonary embolism without acute cor pulmonale: Secondary | ICD-10-CM | POA: Diagnosis not present

## 2018-05-08 ENCOUNTER — Inpatient Hospital Stay
Admission: RE | Admit: 2018-05-08 | Discharge: 2018-05-08 | Disposition: A | Discharge status: Home / Self Care | Payer: Medicare Other | Source: Ambulatory Visit | Attending: Pain Medicine | Admitting: Pain Medicine

## 2018-05-08 DIAGNOSIS — J690 Pneumonitis due to inhalation of food and vomit: Secondary | ICD-10-CM | POA: Diagnosis not present

## 2018-05-08 DIAGNOSIS — I2699 Other pulmonary embolism without acute cor pulmonale: Secondary | ICD-10-CM | POA: Diagnosis not present

## 2018-05-08 DIAGNOSIS — I10 Essential (primary) hypertension: Secondary | ICD-10-CM | POA: Diagnosis not present

## 2018-05-08 DIAGNOSIS — N390 Urinary tract infection, site not specified: Secondary | ICD-10-CM | POA: Diagnosis not present

## 2018-05-08 DIAGNOSIS — K5780 Diverticulitis of intestine, part unspecified, with perforation and abscess without bleeding: Secondary | ICD-10-CM | POA: Diagnosis not present

## 2018-05-08 DIAGNOSIS — D649 Anemia, unspecified: Secondary | ICD-10-CM | POA: Diagnosis not present

## 2018-05-10 DIAGNOSIS — K5780 Diverticulitis of intestine, part unspecified, with perforation and abscess without bleeding: Secondary | ICD-10-CM | POA: Diagnosis not present

## 2018-05-10 DIAGNOSIS — I10 Essential (primary) hypertension: Secondary | ICD-10-CM | POA: Diagnosis not present

## 2018-05-10 DIAGNOSIS — I2699 Other pulmonary embolism without acute cor pulmonale: Secondary | ICD-10-CM | POA: Diagnosis not present

## 2018-05-10 DIAGNOSIS — J690 Pneumonitis due to inhalation of food and vomit: Secondary | ICD-10-CM | POA: Diagnosis not present

## 2018-05-10 DIAGNOSIS — D649 Anemia, unspecified: Secondary | ICD-10-CM | POA: Diagnosis not present

## 2018-05-10 DIAGNOSIS — N390 Urinary tract infection, site not specified: Secondary | ICD-10-CM | POA: Diagnosis not present

## 2018-05-13 DIAGNOSIS — Z880 Allergy status to penicillin: Secondary | ICD-10-CM | POA: Diagnosis not present

## 2018-05-13 DIAGNOSIS — J441 Chronic obstructive pulmonary disease with (acute) exacerbation: Secondary | ICD-10-CM | POA: Diagnosis not present

## 2018-05-13 DIAGNOSIS — I10 Essential (primary) hypertension: Secondary | ICD-10-CM | POA: Diagnosis not present

## 2018-05-13 DIAGNOSIS — J449 Chronic obstructive pulmonary disease, unspecified: Secondary | ICD-10-CM | POA: Diagnosis not present

## 2018-05-13 DIAGNOSIS — Z720 Tobacco use: Secondary | ICD-10-CM | POA: Diagnosis not present

## 2018-05-13 DIAGNOSIS — R457 State of emotional shock and stress, unspecified: Secondary | ICD-10-CM | POA: Diagnosis not present

## 2018-05-13 DIAGNOSIS — R0902 Hypoxemia: Secondary | ICD-10-CM | POA: Diagnosis not present

## 2018-05-13 DIAGNOSIS — F419 Anxiety disorder, unspecified: Secondary | ICD-10-CM | POA: Diagnosis not present

## 2018-05-13 DIAGNOSIS — Z888 Allergy status to other drugs, medicaments and biological substances status: Secondary | ICD-10-CM | POA: Diagnosis not present

## 2018-05-14 DIAGNOSIS — J449 Chronic obstructive pulmonary disease, unspecified: Secondary | ICD-10-CM | POA: Diagnosis not present

## 2018-05-14 DIAGNOSIS — F319 Bipolar disorder, unspecified: Secondary | ICD-10-CM | POA: Diagnosis not present

## 2018-05-14 DIAGNOSIS — I10 Essential (primary) hypertension: Secondary | ICD-10-CM | POA: Diagnosis not present

## 2018-05-14 DIAGNOSIS — Z299 Encounter for prophylactic measures, unspecified: Secondary | ICD-10-CM | POA: Diagnosis not present

## 2018-05-14 DIAGNOSIS — Z6830 Body mass index (BMI) 30.0-30.9, adult: Secondary | ICD-10-CM | POA: Diagnosis not present

## 2018-05-14 DIAGNOSIS — K5792 Diverticulitis of intestine, part unspecified, without perforation or abscess without bleeding: Secondary | ICD-10-CM | POA: Diagnosis not present

## 2018-05-15 DIAGNOSIS — I10 Essential (primary) hypertension: Secondary | ICD-10-CM | POA: Diagnosis not present

## 2018-05-15 DIAGNOSIS — I2699 Other pulmonary embolism without acute cor pulmonale: Secondary | ICD-10-CM | POA: Diagnosis not present

## 2018-05-15 DIAGNOSIS — D649 Anemia, unspecified: Secondary | ICD-10-CM | POA: Diagnosis not present

## 2018-05-15 DIAGNOSIS — N390 Urinary tract infection, site not specified: Secondary | ICD-10-CM | POA: Diagnosis not present

## 2018-05-15 DIAGNOSIS — K5780 Diverticulitis of intestine, part unspecified, with perforation and abscess without bleeding: Secondary | ICD-10-CM | POA: Diagnosis not present

## 2018-05-15 DIAGNOSIS — J690 Pneumonitis due to inhalation of food and vomit: Secondary | ICD-10-CM | POA: Diagnosis not present

## 2018-05-16 DIAGNOSIS — I2699 Other pulmonary embolism without acute cor pulmonale: Secondary | ICD-10-CM | POA: Diagnosis not present

## 2018-05-16 DIAGNOSIS — K5780 Diverticulitis of intestine, part unspecified, with perforation and abscess without bleeding: Secondary | ICD-10-CM | POA: Diagnosis not present

## 2018-05-16 DIAGNOSIS — D649 Anemia, unspecified: Secondary | ICD-10-CM | POA: Diagnosis not present

## 2018-05-16 DIAGNOSIS — I10 Essential (primary) hypertension: Secondary | ICD-10-CM | POA: Diagnosis not present

## 2018-05-16 DIAGNOSIS — N390 Urinary tract infection, site not specified: Secondary | ICD-10-CM | POA: Diagnosis not present

## 2018-05-16 DIAGNOSIS — J690 Pneumonitis due to inhalation of food and vomit: Secondary | ICD-10-CM | POA: Diagnosis not present

## 2018-05-17 DIAGNOSIS — J690 Pneumonitis due to inhalation of food and vomit: Secondary | ICD-10-CM | POA: Diagnosis not present

## 2018-05-17 DIAGNOSIS — K5780 Diverticulitis of intestine, part unspecified, with perforation and abscess without bleeding: Secondary | ICD-10-CM | POA: Diagnosis not present

## 2018-05-17 DIAGNOSIS — D649 Anemia, unspecified: Secondary | ICD-10-CM | POA: Diagnosis not present

## 2018-05-17 DIAGNOSIS — I10 Essential (primary) hypertension: Secondary | ICD-10-CM | POA: Diagnosis not present

## 2018-05-17 DIAGNOSIS — N390 Urinary tract infection, site not specified: Secondary | ICD-10-CM | POA: Diagnosis not present

## 2018-05-17 DIAGNOSIS — I2699 Other pulmonary embolism without acute cor pulmonale: Secondary | ICD-10-CM | POA: Diagnosis not present

## 2018-05-18 DIAGNOSIS — I2699 Other pulmonary embolism without acute cor pulmonale: Secondary | ICD-10-CM | POA: Diagnosis not present

## 2018-05-18 DIAGNOSIS — J690 Pneumonitis due to inhalation of food and vomit: Secondary | ICD-10-CM | POA: Diagnosis not present

## 2018-05-18 DIAGNOSIS — D649 Anemia, unspecified: Secondary | ICD-10-CM | POA: Diagnosis not present

## 2018-05-18 DIAGNOSIS — I10 Essential (primary) hypertension: Secondary | ICD-10-CM | POA: Diagnosis not present

## 2018-05-18 DIAGNOSIS — N390 Urinary tract infection, site not specified: Secondary | ICD-10-CM | POA: Diagnosis not present

## 2018-05-18 DIAGNOSIS — K5780 Diverticulitis of intestine, part unspecified, with perforation and abscess without bleeding: Secondary | ICD-10-CM | POA: Diagnosis not present

## 2018-05-22 DIAGNOSIS — I10 Essential (primary) hypertension: Secondary | ICD-10-CM | POA: Diagnosis not present

## 2018-05-22 DIAGNOSIS — K5780 Diverticulitis of intestine, part unspecified, with perforation and abscess without bleeding: Secondary | ICD-10-CM | POA: Diagnosis not present

## 2018-05-22 DIAGNOSIS — D649 Anemia, unspecified: Secondary | ICD-10-CM | POA: Diagnosis not present

## 2018-05-22 DIAGNOSIS — J690 Pneumonitis due to inhalation of food and vomit: Secondary | ICD-10-CM | POA: Diagnosis not present

## 2018-05-22 DIAGNOSIS — I2699 Other pulmonary embolism without acute cor pulmonale: Secondary | ICD-10-CM | POA: Diagnosis not present

## 2018-05-22 DIAGNOSIS — N390 Urinary tract infection, site not specified: Secondary | ICD-10-CM | POA: Diagnosis not present

## 2018-05-23 DIAGNOSIS — G894 Chronic pain syndrome: Secondary | ICD-10-CM | POA: Diagnosis not present

## 2018-05-23 DIAGNOSIS — M47817 Spondylosis without myelopathy or radiculopathy, lumbosacral region: Secondary | ICD-10-CM | POA: Diagnosis not present

## 2018-05-23 DIAGNOSIS — M47816 Spondylosis without myelopathy or radiculopathy, lumbar region: Secondary | ICD-10-CM | POA: Diagnosis not present

## 2018-05-23 DIAGNOSIS — M542 Cervicalgia: Secondary | ICD-10-CM | POA: Diagnosis not present

## 2018-05-23 DIAGNOSIS — M5136 Other intervertebral disc degeneration, lumbar region: Secondary | ICD-10-CM | POA: Diagnosis not present

## 2018-05-24 DIAGNOSIS — K5780 Diverticulitis of intestine, part unspecified, with perforation and abscess without bleeding: Secondary | ICD-10-CM | POA: Diagnosis not present

## 2018-05-24 DIAGNOSIS — I10 Essential (primary) hypertension: Secondary | ICD-10-CM | POA: Diagnosis not present

## 2018-05-24 DIAGNOSIS — N390 Urinary tract infection, site not specified: Secondary | ICD-10-CM | POA: Diagnosis not present

## 2018-05-24 DIAGNOSIS — J690 Pneumonitis due to inhalation of food and vomit: Secondary | ICD-10-CM | POA: Diagnosis not present

## 2018-05-24 DIAGNOSIS — I2699 Other pulmonary embolism without acute cor pulmonale: Secondary | ICD-10-CM | POA: Diagnosis not present

## 2018-05-24 DIAGNOSIS — D649 Anemia, unspecified: Secondary | ICD-10-CM | POA: Diagnosis not present

## 2018-05-29 DIAGNOSIS — D649 Anemia, unspecified: Secondary | ICD-10-CM | POA: Diagnosis not present

## 2018-05-29 DIAGNOSIS — K5780 Diverticulitis of intestine, part unspecified, with perforation and abscess without bleeding: Secondary | ICD-10-CM | POA: Diagnosis not present

## 2018-05-29 DIAGNOSIS — N390 Urinary tract infection, site not specified: Secondary | ICD-10-CM | POA: Diagnosis not present

## 2018-05-29 DIAGNOSIS — I2699 Other pulmonary embolism without acute cor pulmonale: Secondary | ICD-10-CM | POA: Diagnosis not present

## 2018-05-29 DIAGNOSIS — J690 Pneumonitis due to inhalation of food and vomit: Secondary | ICD-10-CM | POA: Diagnosis not present

## 2018-05-29 DIAGNOSIS — I10 Essential (primary) hypertension: Secondary | ICD-10-CM | POA: Diagnosis not present

## 2018-05-30 DIAGNOSIS — J449 Chronic obstructive pulmonary disease, unspecified: Secondary | ICD-10-CM | POA: Diagnosis not present

## 2018-05-30 DIAGNOSIS — I10 Essential (primary) hypertension: Secondary | ICD-10-CM | POA: Diagnosis not present

## 2018-05-30 DIAGNOSIS — F319 Bipolar disorder, unspecified: Secondary | ICD-10-CM | POA: Diagnosis not present

## 2018-05-30 DIAGNOSIS — Z23 Encounter for immunization: Secondary | ICD-10-CM | POA: Diagnosis not present

## 2018-05-30 DIAGNOSIS — Z6830 Body mass index (BMI) 30.0-30.9, adult: Secondary | ICD-10-CM | POA: Diagnosis not present

## 2018-05-30 DIAGNOSIS — Z299 Encounter for prophylactic measures, unspecified: Secondary | ICD-10-CM | POA: Diagnosis not present

## 2018-05-30 DIAGNOSIS — F17210 Nicotine dependence, cigarettes, uncomplicated: Secondary | ICD-10-CM | POA: Diagnosis not present

## 2018-05-31 DIAGNOSIS — D649 Anemia, unspecified: Secondary | ICD-10-CM | POA: Diagnosis not present

## 2018-05-31 DIAGNOSIS — I2699 Other pulmonary embolism without acute cor pulmonale: Secondary | ICD-10-CM | POA: Diagnosis not present

## 2018-05-31 DIAGNOSIS — I10 Essential (primary) hypertension: Secondary | ICD-10-CM | POA: Diagnosis not present

## 2018-05-31 DIAGNOSIS — N390 Urinary tract infection, site not specified: Secondary | ICD-10-CM | POA: Diagnosis not present

## 2018-05-31 DIAGNOSIS — K5780 Diverticulitis of intestine, part unspecified, with perforation and abscess without bleeding: Secondary | ICD-10-CM | POA: Diagnosis not present

## 2018-05-31 DIAGNOSIS — J690 Pneumonitis due to inhalation of food and vomit: Secondary | ICD-10-CM | POA: Diagnosis not present

## 2018-06-05 DIAGNOSIS — I2699 Other pulmonary embolism without acute cor pulmonale: Secondary | ICD-10-CM | POA: Diagnosis not present

## 2018-06-05 DIAGNOSIS — J690 Pneumonitis due to inhalation of food and vomit: Secondary | ICD-10-CM | POA: Diagnosis not present

## 2018-06-05 DIAGNOSIS — N390 Urinary tract infection, site not specified: Secondary | ICD-10-CM | POA: Diagnosis not present

## 2018-06-05 DIAGNOSIS — K5780 Diverticulitis of intestine, part unspecified, with perforation and abscess without bleeding: Secondary | ICD-10-CM | POA: Diagnosis not present

## 2018-06-05 DIAGNOSIS — D649 Anemia, unspecified: Secondary | ICD-10-CM | POA: Diagnosis not present

## 2018-06-05 DIAGNOSIS — I10 Essential (primary) hypertension: Secondary | ICD-10-CM | POA: Diagnosis not present

## 2018-06-07 DIAGNOSIS — M1712 Unilateral primary osteoarthritis, left knee: Secondary | ICD-10-CM | POA: Diagnosis not present

## 2018-06-07 DIAGNOSIS — D649 Anemia, unspecified: Secondary | ICD-10-CM | POA: Diagnosis not present

## 2018-06-07 DIAGNOSIS — J690 Pneumonitis due to inhalation of food and vomit: Secondary | ICD-10-CM | POA: Diagnosis not present

## 2018-06-07 DIAGNOSIS — I2699 Other pulmonary embolism without acute cor pulmonale: Secondary | ICD-10-CM | POA: Diagnosis not present

## 2018-06-07 DIAGNOSIS — N390 Urinary tract infection, site not specified: Secondary | ICD-10-CM | POA: Diagnosis not present

## 2018-06-07 DIAGNOSIS — I10 Essential (primary) hypertension: Secondary | ICD-10-CM | POA: Diagnosis not present

## 2018-06-07 DIAGNOSIS — K5780 Diverticulitis of intestine, part unspecified, with perforation and abscess without bleeding: Secondary | ICD-10-CM | POA: Diagnosis not present

## 2018-06-11 DIAGNOSIS — D649 Anemia, unspecified: Secondary | ICD-10-CM | POA: Diagnosis not present

## 2018-06-11 DIAGNOSIS — J690 Pneumonitis due to inhalation of food and vomit: Secondary | ICD-10-CM | POA: Diagnosis not present

## 2018-06-11 DIAGNOSIS — K5780 Diverticulitis of intestine, part unspecified, with perforation and abscess without bleeding: Secondary | ICD-10-CM | POA: Diagnosis not present

## 2018-06-11 DIAGNOSIS — I2699 Other pulmonary embolism without acute cor pulmonale: Secondary | ICD-10-CM | POA: Diagnosis not present

## 2018-06-11 DIAGNOSIS — N390 Urinary tract infection, site not specified: Secondary | ICD-10-CM | POA: Diagnosis not present

## 2018-06-11 DIAGNOSIS — I10 Essential (primary) hypertension: Secondary | ICD-10-CM | POA: Diagnosis not present

## 2018-06-12 DIAGNOSIS — K5780 Diverticulitis of intestine, part unspecified, with perforation and abscess without bleeding: Secondary | ICD-10-CM | POA: Diagnosis not present

## 2018-06-12 DIAGNOSIS — I2699 Other pulmonary embolism without acute cor pulmonale: Secondary | ICD-10-CM | POA: Diagnosis not present

## 2018-06-12 DIAGNOSIS — I10 Essential (primary) hypertension: Secondary | ICD-10-CM | POA: Diagnosis not present

## 2018-06-12 DIAGNOSIS — J690 Pneumonitis due to inhalation of food and vomit: Secondary | ICD-10-CM | POA: Diagnosis not present

## 2018-06-12 DIAGNOSIS — N390 Urinary tract infection, site not specified: Secondary | ICD-10-CM | POA: Diagnosis not present

## 2018-06-12 DIAGNOSIS — D649 Anemia, unspecified: Secondary | ICD-10-CM | POA: Diagnosis not present

## 2018-06-19 DIAGNOSIS — I2699 Other pulmonary embolism without acute cor pulmonale: Secondary | ICD-10-CM | POA: Diagnosis not present

## 2018-06-19 DIAGNOSIS — M47816 Spondylosis without myelopathy or radiculopathy, lumbar region: Secondary | ICD-10-CM | POA: Diagnosis not present

## 2018-06-19 DIAGNOSIS — K5780 Diverticulitis of intestine, part unspecified, with perforation and abscess without bleeding: Secondary | ICD-10-CM | POA: Diagnosis not present

## 2018-06-19 DIAGNOSIS — N390 Urinary tract infection, site not specified: Secondary | ICD-10-CM | POA: Diagnosis not present

## 2018-06-19 DIAGNOSIS — D649 Anemia, unspecified: Secondary | ICD-10-CM | POA: Diagnosis not present

## 2018-06-19 DIAGNOSIS — M419 Scoliosis, unspecified: Secondary | ICD-10-CM | POA: Diagnosis not present

## 2018-06-19 DIAGNOSIS — M5136 Other intervertebral disc degeneration, lumbar region: Secondary | ICD-10-CM | POA: Diagnosis not present

## 2018-06-19 DIAGNOSIS — J690 Pneumonitis due to inhalation of food and vomit: Secondary | ICD-10-CM | POA: Diagnosis not present

## 2018-06-19 DIAGNOSIS — M1712 Unilateral primary osteoarthritis, left knee: Secondary | ICD-10-CM | POA: Diagnosis not present

## 2018-06-19 DIAGNOSIS — I10 Essential (primary) hypertension: Secondary | ICD-10-CM | POA: Diagnosis not present

## 2018-06-24 IMAGING — DX DG CHEST 2V
2 series · 2 of 2 positions shown · non-contrast
Comparison: 04/10/2017

CLINICAL DATA: Leukocytosis, history COPD, hypertension, recent 2
week hospitalization for acute diverticulitis with abscess post
percutaneous drainage

EXAM:
CHEST  2 VIEW

[chest lat]
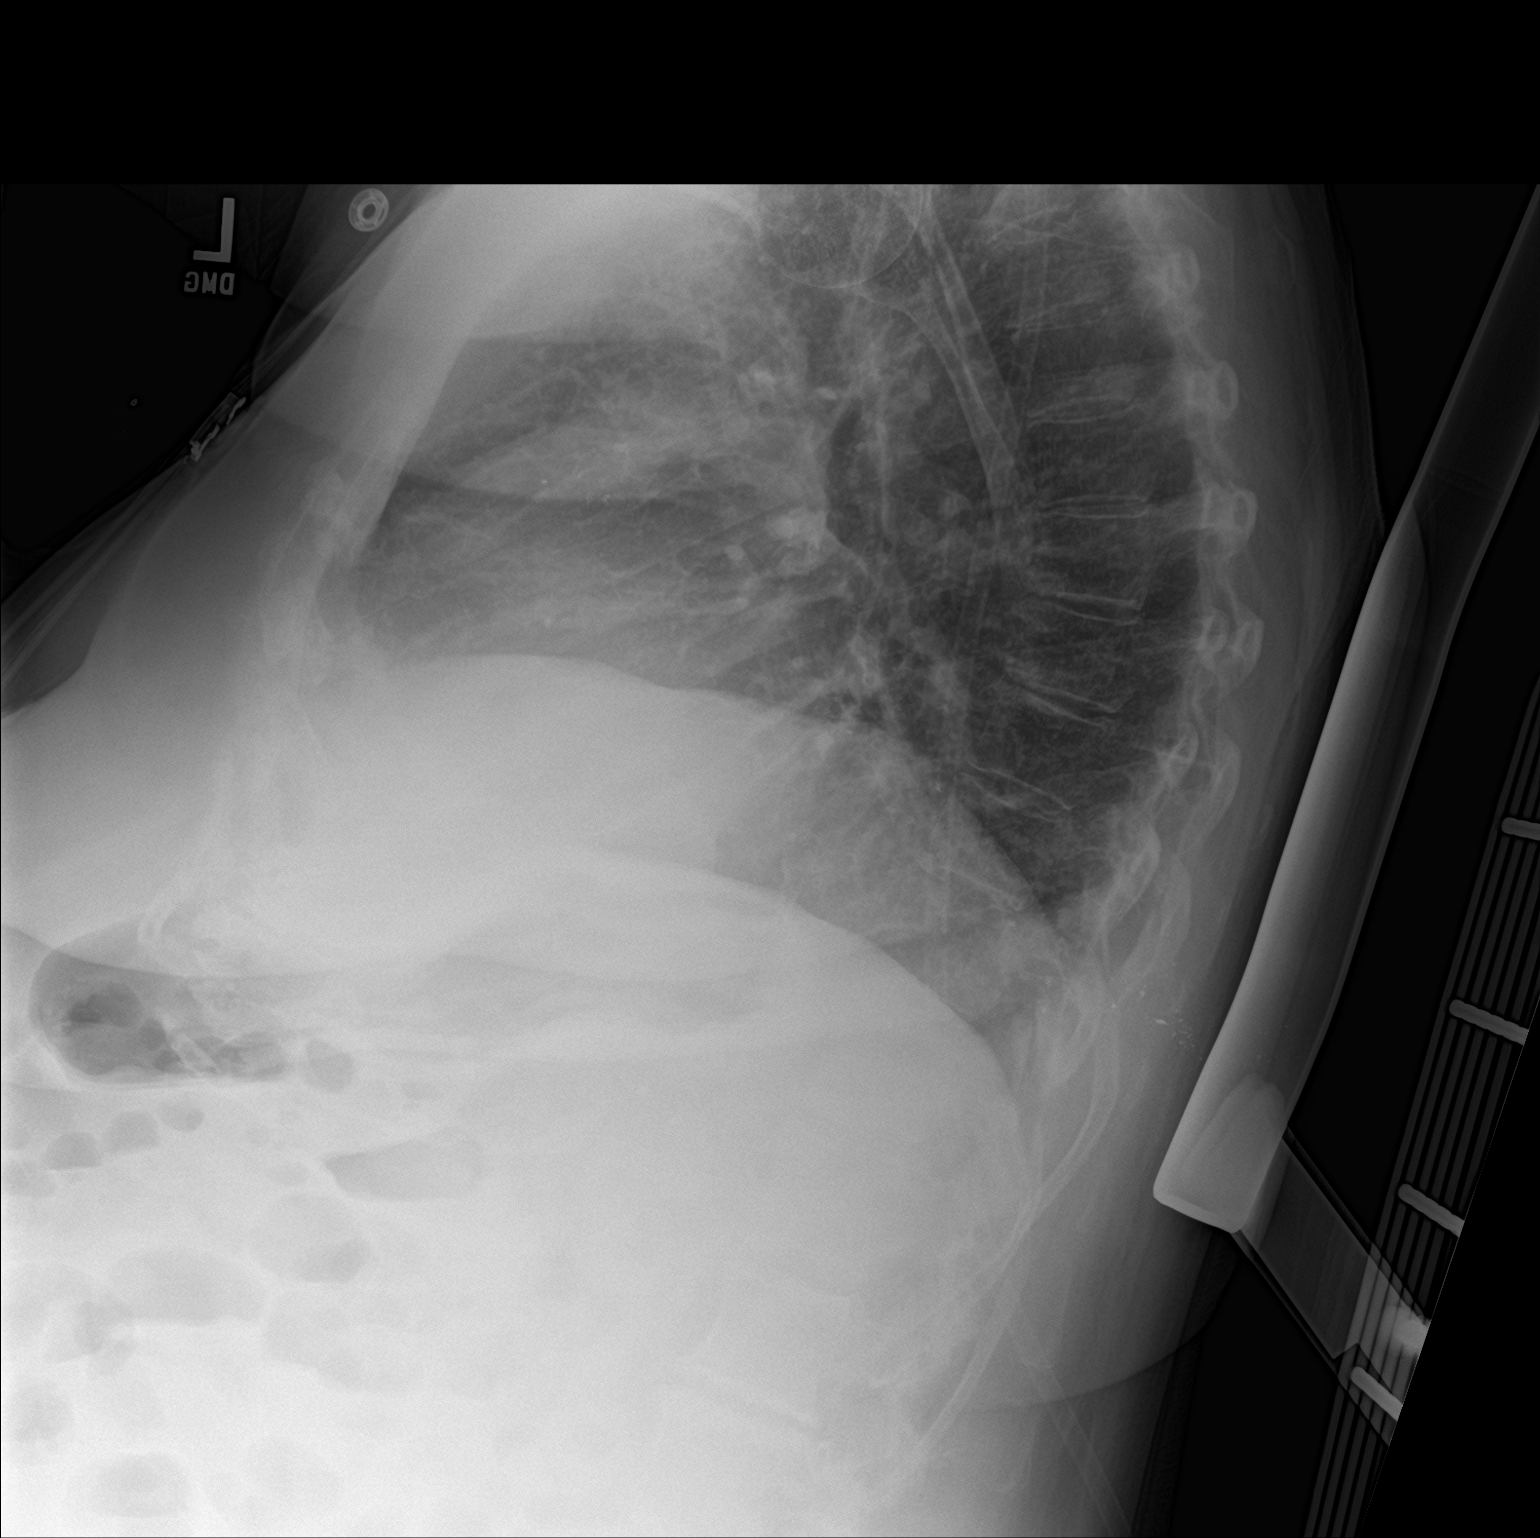

[chest ap]
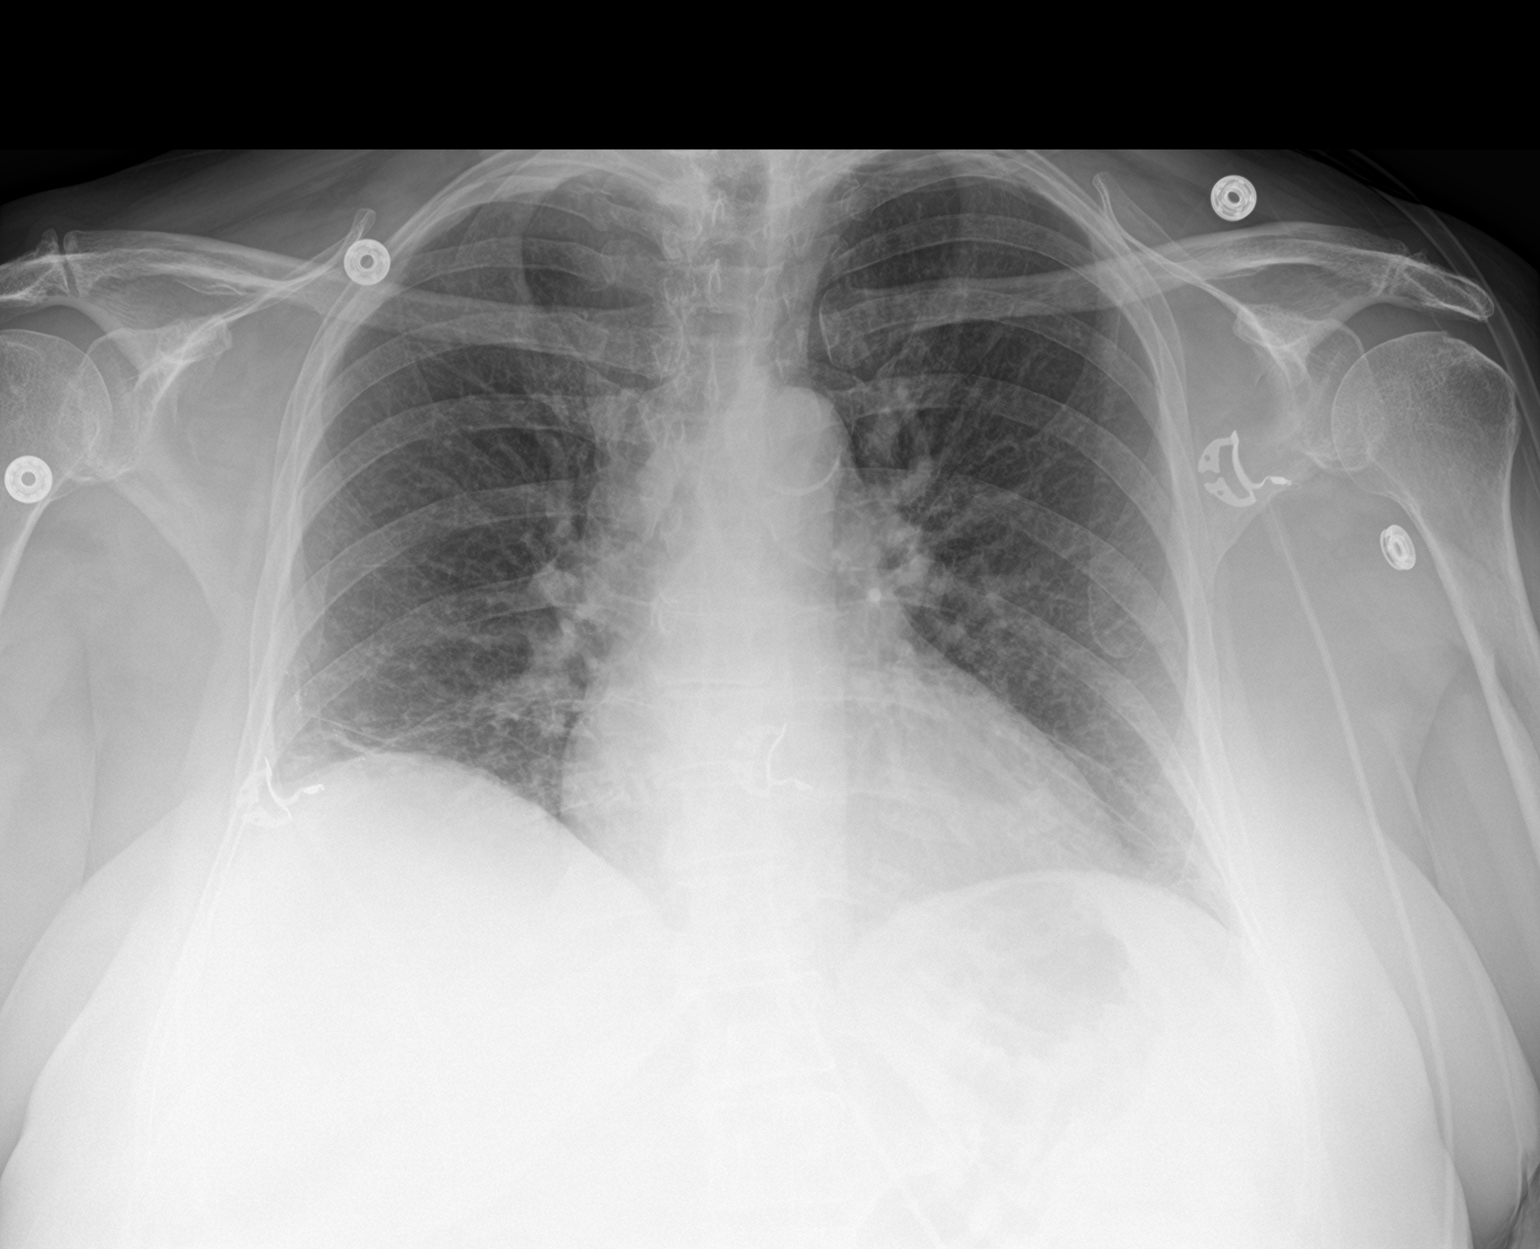

[2 of 2 positions shown; findings below may reference images not displayed]

FINDINGS: Upper normal heart size.

Slight pulmonary vascular congestion.

Atherosclerotic calcification aorta.

Mild bronchitic changes with persistent elevation of RIGHT diaphragm
and basilar atelectasis.

No acute infiltrate, pleural effusion or pneumothorax.

Bones demineralized.
IMPRESSION: Mild bronchitic changes and RIGHT basilar atelectasis.

No acute infiltrate.

Aortic Atherosclerosis (WD2OD-54O.O).

## 2018-07-01 IMAGING — CT CT PELVIS W/O CM
2 of 4 series · 17 of 46 positions shown, 19 images · non-contrast
Comparison: CT abdomen pelvis - 04/16/2017;

CLINICAL DATA: History of diverticular abscess, post percutaneous
drainage catheter placement on 03/31/2017
TECHNIQUE: Multidetector CT imaging of the abdomen was performed following the
standard protocol without IV contrast.

[Series 3: i-spiral 3.0 b40f · axial · 0.92mm/px · z∈[-144,+96]mm · 14 of 92 slices shown, 16 images]
[im 6/92  soft-tissue]
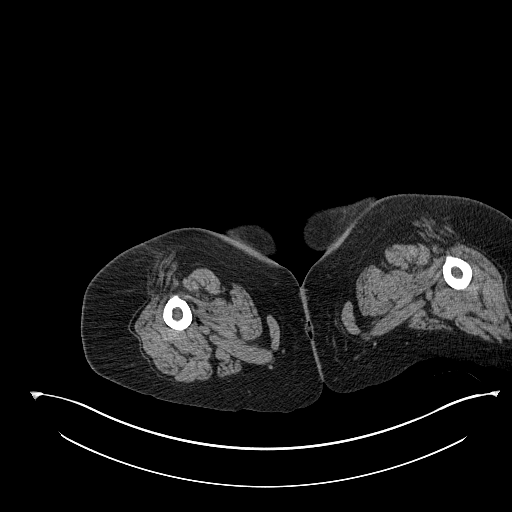
[im 6/92  bone]
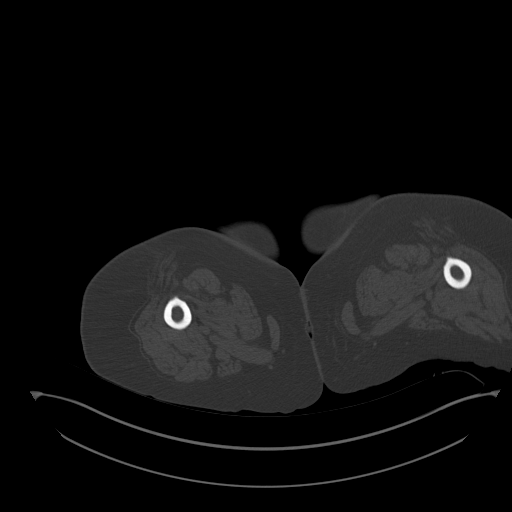
[im 12/92  soft-tissue]
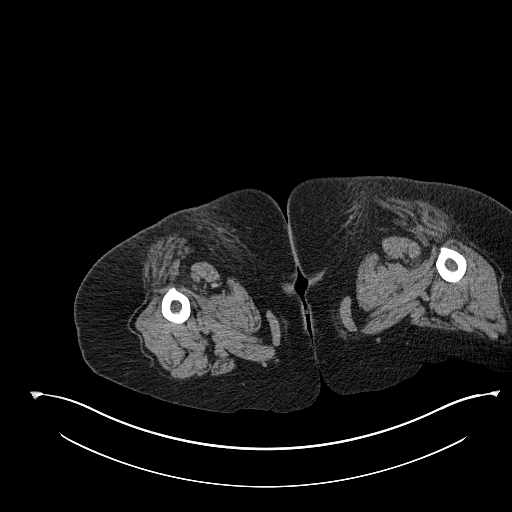
[im 18/92  soft-tissue]
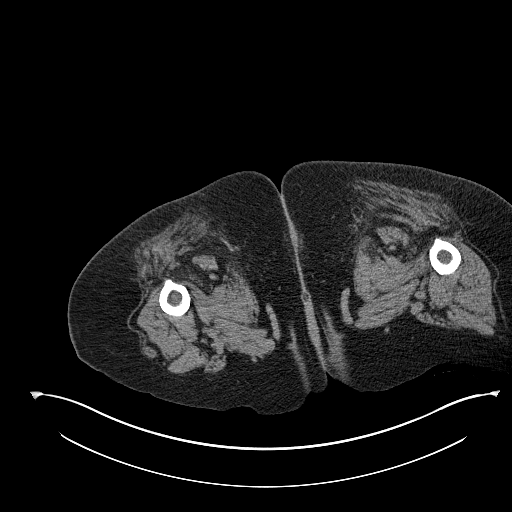
[im 23/92  soft-tissue]
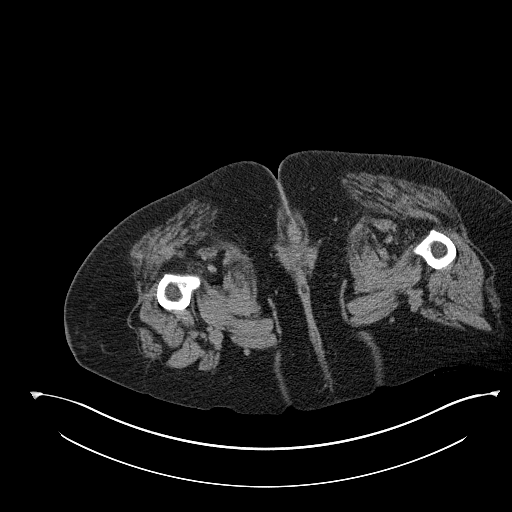
[im 29/92  soft-tissue]
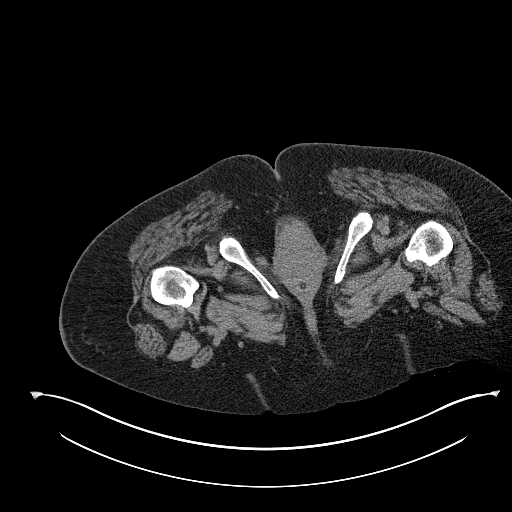
[im 35/92  soft-tissue]
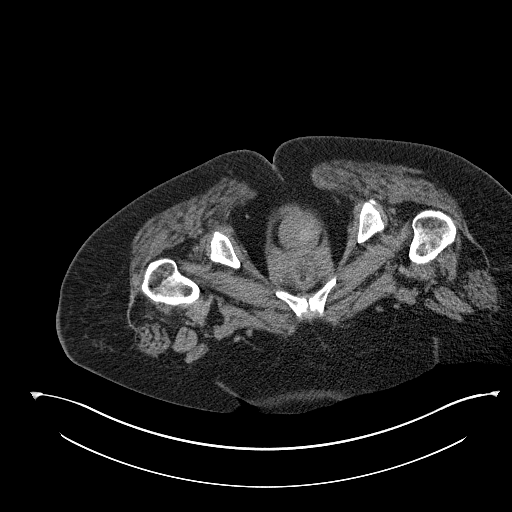
[im 40/92  soft-tissue]
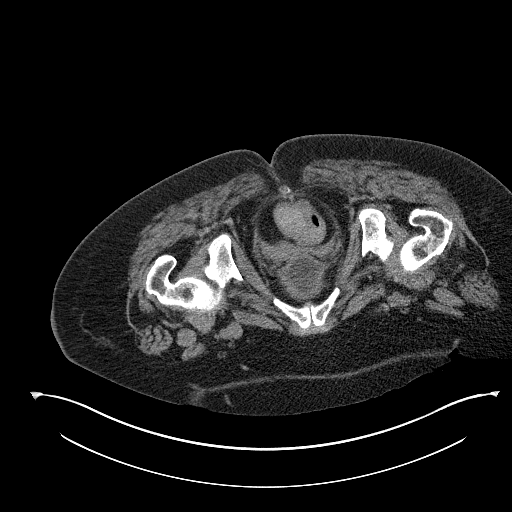
[im 52/92  soft-tissue]
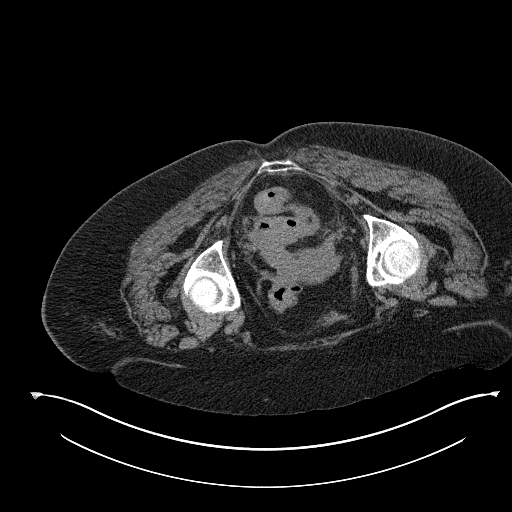
[im 57/92  soft-tissue]
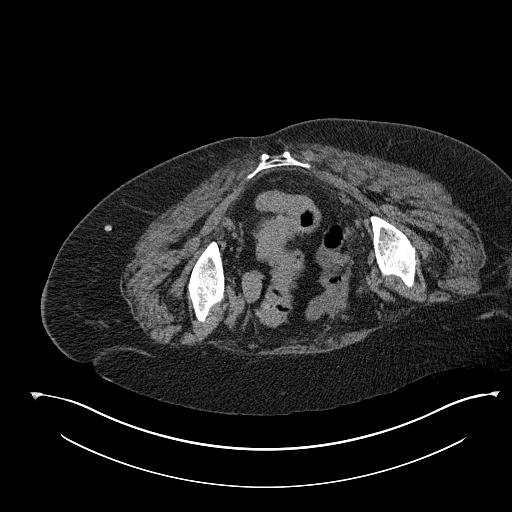
[im 57/92  bone]
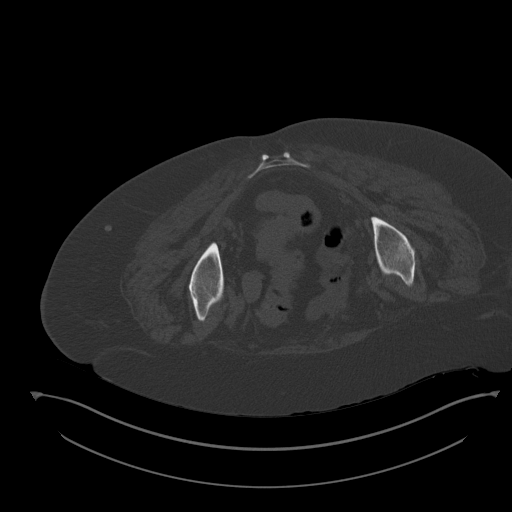
[im 63/92  soft-tissue]
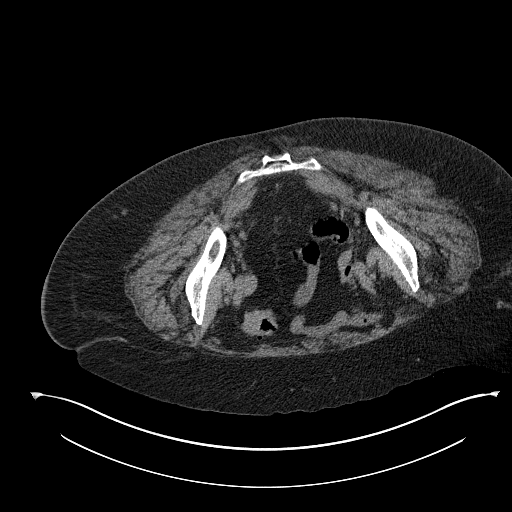
[im 69/92  soft-tissue]
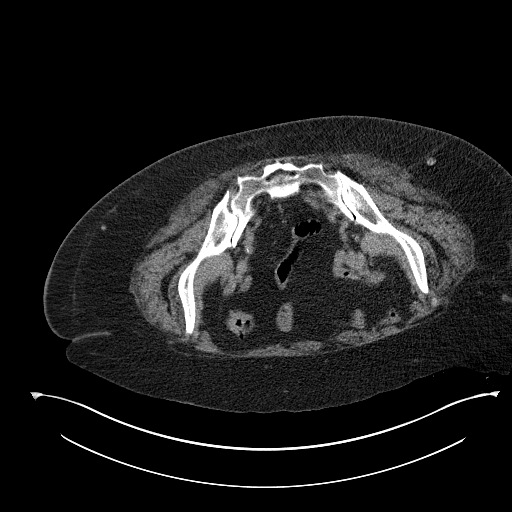
[im 74/92  soft-tissue]
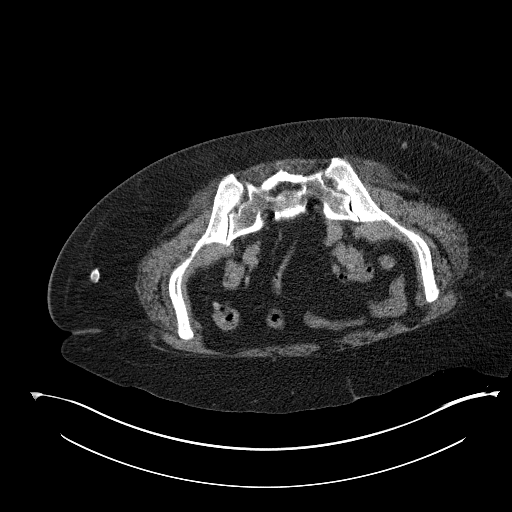
[im 80/92  soft-tissue]
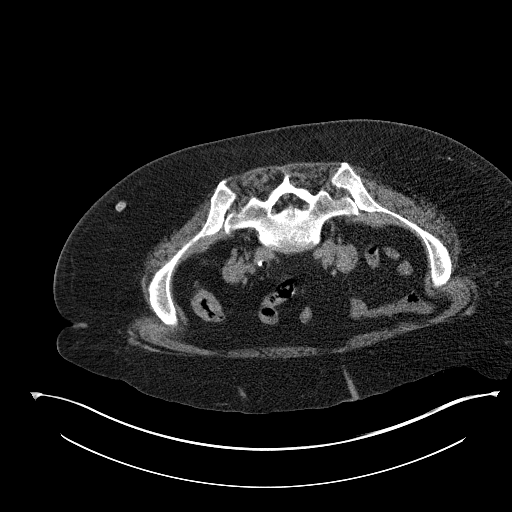
[im 86/92  soft-tissue]
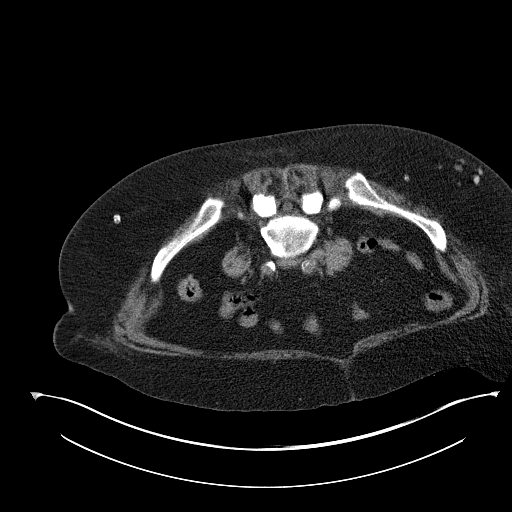

[Series 602: coronal · coronal · 0.92mm/px · 3 of 81 slices shown]
[im 27/81  soft-tissue]
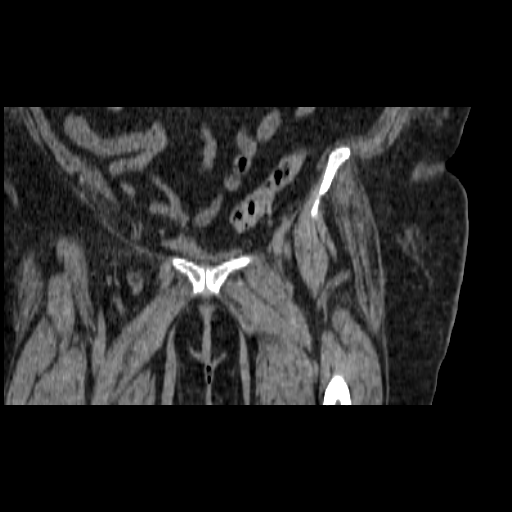
[im 36/81  soft-tissue]
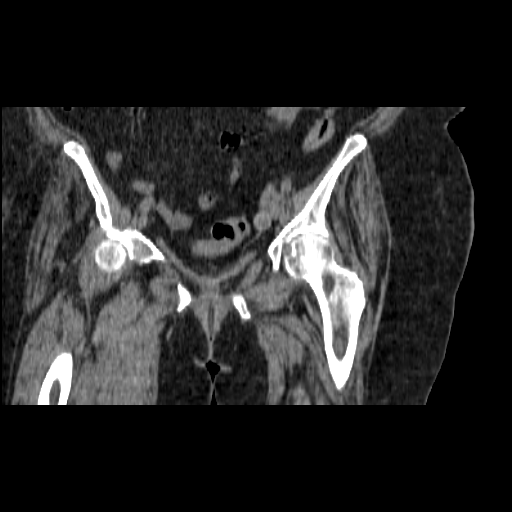
[im 45/81  soft-tissue]
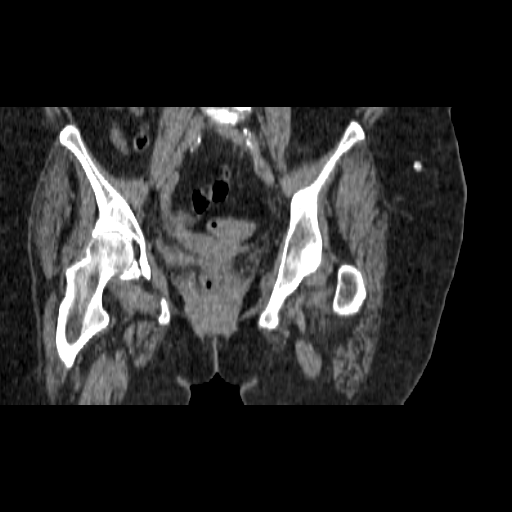

[17 of 46 positions shown; findings below may reference images not displayed]

Unfortunately, the drainage catheter was removed prior to
percutaneous drainage catheter injection and patient has developed a
recurrent diverticular abscess at this location.

As such, request has been made for repeat CT-guided
aspiration/drainage catheter placement.

EXAM:
CT PELVIS WITHOUT CONTRAST LIMITED
04/10/2017; 03/25/2017;
CT-guided left trans gluteal approach percutaneous drainage catheter
placement - 03/31/2017
FINDINGS: Patient was positioned prone on the CT gantry.

Noncontrast images were obtained of the lower pelvis and
demonstrated reduction in size of pericolonic diverticular abscess
with potential tiny residual intramural component measuring only
x 1.2 cm (image 35, series 2), previously, 2.6 x 2.0 cm.

Given reduction/near resolution of the diverticular abscess,
CT-guided aspiration/drainage catheter placement was not attempted.
IMPRESSION: Interval reduction / near resolution of diverticular abscess with
potential tiny intramural component measuring approximately 1.9 cm,
previously, 2.6 cm. As such, attempted CT-guided aspiration /
drainage catheter placement was not attempted.

## 2018-07-02 ENCOUNTER — Other Ambulatory Visit: Payer: Medicare Other

## 2018-07-04 DIAGNOSIS — M5136 Other intervertebral disc degeneration, lumbar region: Secondary | ICD-10-CM | POA: Diagnosis not present

## 2018-07-04 DIAGNOSIS — M47817 Spondylosis without myelopathy or radiculopathy, lumbosacral region: Secondary | ICD-10-CM | POA: Diagnosis not present

## 2018-07-05 DIAGNOSIS — M79671 Pain in right foot: Secondary | ICD-10-CM | POA: Diagnosis not present

## 2018-07-05 DIAGNOSIS — M79672 Pain in left foot: Secondary | ICD-10-CM | POA: Diagnosis not present

## 2018-07-05 DIAGNOSIS — M1990 Unspecified osteoarthritis, unspecified site: Secondary | ICD-10-CM | POA: Diagnosis not present

## 2018-07-10 DIAGNOSIS — Z299 Encounter for prophylactic measures, unspecified: Secondary | ICD-10-CM | POA: Diagnosis not present

## 2018-07-10 DIAGNOSIS — F319 Bipolar disorder, unspecified: Secondary | ICD-10-CM | POA: Diagnosis not present

## 2018-07-10 DIAGNOSIS — I10 Essential (primary) hypertension: Secondary | ICD-10-CM | POA: Diagnosis not present

## 2018-07-10 DIAGNOSIS — J449 Chronic obstructive pulmonary disease, unspecified: Secondary | ICD-10-CM | POA: Diagnosis not present

## 2018-07-10 DIAGNOSIS — Z6830 Body mass index (BMI) 30.0-30.9, adult: Secondary | ICD-10-CM | POA: Diagnosis not present

## 2018-07-26 DIAGNOSIS — M47816 Spondylosis without myelopathy or radiculopathy, lumbar region: Secondary | ICD-10-CM | POA: Diagnosis not present

## 2018-07-26 DIAGNOSIS — M542 Cervicalgia: Secondary | ICD-10-CM | POA: Diagnosis not present

## 2018-07-26 DIAGNOSIS — G894 Chronic pain syndrome: Secondary | ICD-10-CM | POA: Diagnosis not present

## 2018-07-26 DIAGNOSIS — Z79899 Other long term (current) drug therapy: Secondary | ICD-10-CM | POA: Diagnosis not present

## 2018-07-26 DIAGNOSIS — Z79891 Long term (current) use of opiate analgesic: Secondary | ICD-10-CM | POA: Diagnosis not present

## 2018-07-26 DIAGNOSIS — M1712 Unilateral primary osteoarthritis, left knee: Secondary | ICD-10-CM | POA: Diagnosis not present

## 2018-08-08 DIAGNOSIS — Z299 Encounter for prophylactic measures, unspecified: Secondary | ICD-10-CM | POA: Diagnosis not present

## 2018-08-08 DIAGNOSIS — K589 Irritable bowel syndrome without diarrhea: Secondary | ICD-10-CM | POA: Diagnosis not present

## 2018-08-08 DIAGNOSIS — Z6830 Body mass index (BMI) 30.0-30.9, adult: Secondary | ICD-10-CM | POA: Diagnosis not present

## 2018-08-08 DIAGNOSIS — I10 Essential (primary) hypertension: Secondary | ICD-10-CM | POA: Diagnosis not present

## 2018-08-08 DIAGNOSIS — J449 Chronic obstructive pulmonary disease, unspecified: Secondary | ICD-10-CM | POA: Diagnosis not present

## 2018-08-23 DIAGNOSIS — Z79899 Other long term (current) drug therapy: Secondary | ICD-10-CM | POA: Diagnosis not present

## 2018-08-23 DIAGNOSIS — M542 Cervicalgia: Secondary | ICD-10-CM | POA: Diagnosis not present

## 2018-08-23 DIAGNOSIS — G894 Chronic pain syndrome: Secondary | ICD-10-CM | POA: Diagnosis not present

## 2018-08-23 DIAGNOSIS — M1712 Unilateral primary osteoarthritis, left knee: Secondary | ICD-10-CM | POA: Diagnosis not present

## 2018-08-23 DIAGNOSIS — Z79891 Long term (current) use of opiate analgesic: Secondary | ICD-10-CM | POA: Diagnosis not present

## 2018-08-23 DIAGNOSIS — M47816 Spondylosis without myelopathy or radiculopathy, lumbar region: Secondary | ICD-10-CM | POA: Diagnosis not present

## 2018-10-23 NOTE — Progress Notes (Deleted)
Psychiatric Initial Adult Assessment   Patient Identification: Kathleen Valenzuela MRN:  [id number] Date of Evaluation:  10/23/2018 Referral Source: Monico Blitz, MD Chief Complaint:   Visit Diagnosis: No diagnosis found.  History of Present Illness:   Kathleen Valenzuela is a 68 y.o. year old female with a history of bipolar disorder, dementia on aricept per chart review,  COPD, hypertension, history of diverticulitis with abscess s/p percutaneous drainage, who is referred for    Associated Signs/Symptoms: Depression Symptoms:  {DEPRESSION SYMPTOMS:20000} (Hypo) Manic Symptoms:  {BHH MANIC SYMPTOMS:22872} Anxiety Symptoms:  {BHH ANXIETY SYMPTOMS:22873} Psychotic Symptoms:  {BHH PSYCHOTIC SYMPTOMS:22874} PTSD Symptoms: {BHH PTSD SYMPTOMS:22875}  Past Psychiatric History:  Outpatient:  Psychiatry admission:  Previous suicide attempt:  Past trials of medication:  History of violence:   Previous Psychotropic Medications: {YES/NO:21197}  Substance Abuse History in the last 12 months:  {yes no:314532}  Consequences of Substance Abuse: {BHH CONSEQUENCES OF SUBSTANCE ABUSE:22880}  Past Medical History:  Past Medical History:  Diagnosis Date  . Anemia   . Anxiety   . Aortic atherosclerosis ([place]) 04/21/2017  . Arthritis   . Bipolar 1 disorder ([place])   . Chronic back pain   . COPD (chronic obstructive pulmonary disease) ([place])   . Diastolic dysfunction with heart failure ([place]) 04/20/2017  . Duodenal stricture   . Early onset Alzheimer's dementia   . GERD (gastroesophageal reflux disease)   . Heart murmur   . High cholesterol   . Hypertension   . Shortness of breath dyspnea     Past Surgical History:  Procedure Laterality Date  . BIOPSY  01/06/2017   Procedure: BIOPSY;  Surgeon: Rogene Houston, MD;  [facility];  Service: Endoscopy;;  esophageal biopsy  . CESAREAN SECTION    . COLONOSCOPY    . ESOPHAGEAL DILATION N/A 10/29/2015   Procedure: ESOPHAGEAL DILATION;  Surgeon:  Rogene Houston, MD;  [facility];  Service: Endoscopy;  Laterality: N/A;  . ESOPHAGEAL DILATION N/A 01/29/2016   Procedure: ESOPHAGEAL DILATION;  Surgeon: Rogene Houston, MD;  [facility];  Service: Endoscopy;  Laterality: N/A;  . ESOPHAGEAL DILATION N/A 01/06/2017   Procedure: ESOPHAGEAL DILATION;  Surgeon: Rogene Houston, MD;  [facility];  Service: Endoscopy;  Laterality: N/A;  . ESOPHAGOGASTRODUODENOSCOPY N/A 10/29/2015   Procedure: ESOPHAGOGASTRODUODENOSCOPY (EGD);  Surgeon: Rogene Houston, MD;  [facility];  Service: Endoscopy;  Laterality: N/A;  1200  . ESOPHAGOGASTRODUODENOSCOPY (EGD) WITH PROPOFOL N/A 01/29/2016   Procedure: ESOPHAGOGASTRODUODENOSCOPY (EGD) WITH PROPOFOL;  Surgeon: Rogene Houston, MD;  [facility];  Service: Endoscopy;  Laterality: N/A;  7:30 - moved to 4/21 @11 : 25 - Ann notified pt to arrive at 10:00  . ESOPHAGOGASTRODUODENOSCOPY (EGD) WITH PROPOFOL N/A 01/06/2017   Procedure: ESOPHAGOGASTRODUODENOSCOPY (EGD) WITH PROPOFOL;  Surgeon: Rogene Houston, MD;  [facility];  Service: Endoscopy;  Laterality: N/A;  11:20  . ESOPHAGOGASTRODUODENOSCOPY (EGD) WITH PROPOFOL N/A 02/17/2017   Procedure: ESOPHAGOGASTRODUODENOSCOPY (EGD) WITH PROPOFOL;  Surgeon: Rogene Houston, MD;  [facility];  Service: Endoscopy;  Laterality: N/A;  10:30  . FOOT SURGERY Right    bunionectomy  . HEMORRHOID SURGERY      Family Psychiatric History: ***  Family History: No family history on file.  Social History:   Social History   Socioeconomic History  . Marital status: Divorced    Spouse name: Not on file  . Number of children: Not on file  .  Years of education: Not on file  . Highest education level: Not on file  Occupational History  . Not on file  Social Needs  . Financial resource strain: Not on file  . Food insecurity:    Worry: Not on file    Inability: Not on file  . Transportation needs:     Medical: Not on file    Non-medical: Not on file  Tobacco Use  . Smoking status: Former Smoker    Packs/day: 2.00    Years: 35.00    Pack years: 70.00    Types: Cigarettes    Last attempt to quit: 07/25/2015    Years since quitting: 3.2  . Smokeless tobacco: Never Used  . Tobacco comment: quit 2 weeks ago (November 2016)  Substance and Sexual Activity  . Alcohol use: No    Alcohol/week: 0.0 standard drinks  . Drug use: No  . Sexual activity: Never    Birth control/protection: None  Lifestyle  . Physical activity:    Days per week: Not on file    Minutes per session: Not on file  . Stress: Not on file  Relationships  . Social connections:    Talks on phone: Not on file    Gets together: Not on file    Attends religious service: Not on file    Active member of club or organization: Not on file    Attends meetings of clubs or organizations: Not on file    Relationship status: Not on file  Other Topics Concern  . Not on file  Social History Narrative  . Not on file    Additional Social History: ***  Allergies:   Allergies  Allergen Reactions  . Penicillins Rash    Has patient had a PCN reaction causing immediate rash, facial/tongue/throat swelling, SOB or lightheadedness with hypotension: Yes Has patient had a PCN reaction causing severe rash involving mucus membranes or skin necrosis: No Has patient had a PCN reaction that required hospitalization: No Has patient had a PCN reaction occurring within the last 10 years: No If all of the above answers are "NO", then may proceed with Cephalosporin use.     Metabolic Disorder Labs: Lab Results  Component Value Date   HGBA1C 5.2 04/15/2017   MPG 103 04/15/2017   No results found for: PROLACTIN No results found for: CHOL, TRIG, HDL, CHOLHDL, VLDL, LDLCALC No results found for: TSH  Therapeutic Level Labs: Lab Results  Component Value Date   LITHIUM 0.92 04/11/2017   No results found for: CBMZ No results found  for: VALPROATE  Current Medications: Current Outpatient Medications  Medication Sig Dispense Refill  . albuterol (PROVENTIL HFA;VENTOLIN HFA) 108 (90 BASE) MCG/ACT inhaler Inhale 2 puffs into the lungs 4 (four) times daily. (0800, 1200, 1600, & 2000)    . ALPRAZolam (XANAX) 1 MG tablet Take 0.5 tablets (0.5 mg total) by mouth 3 (three) times daily as needed for anxiety. (0800, 1400, & 2000) 1 tablet 0  . atorvastatin (LIPITOR) 10 MG tablet Take 10 mg by mouth daily at 8 pm.     . calcium carbonate (TUMS - DOSED IN MG ELEMENTAL CALCIUM) 500 MG chewable tablet Chew 2 tablets by mouth every 2 (two) hours as needed for indigestion (CHEW BEFORE SWALLOWING).    Marland Kitchen dicyclomine (BENTYL) 20 MG tablet Take 20 mg by mouth 4 (four) times daily. (0800, 1200, 1600, & 2000)    . docusate sodium (COLACE) 100 MG capsule Take 100 mg by mouth 2 (two)  times daily. (0800 & 2000)    . donepezil (ARICEPT) 10 MG tablet Take 10 mg by mouth daily at 8 pm.    . Ferrous Gluconate 324 (37.5 Fe) MG TABS Take 324 mg by mouth 2 (two) times daily. (0800 & 2000)    . fluticasone (FLONASE) 50 MCG/ACT nasal spray Place 1 spray into both nostrils daily. (0800)    . Fluticasone Furoate-Vilanterol (BREO ELLIPTA) 200-25 MCG/INH AEPB Inhale 1 puff into the lungs daily. (0800)    . furosemide (LASIX) 40 MG tablet Take 40 mg by mouth daily. (0800)    . isosorbide mononitrate (IMDUR) 30 MG 24 hr tablet Take 30 mg by mouth daily. (0800)    . lithium 300 MG tablet Take 300 mg by mouth 2 (two) times daily. (0800 & 2000)    . lithium carbonate 150 MG capsule Take 150 mg by mouth every morning. Take 150 mg capsule along with 300 mg tablet to equal 450 mg.    . Menthol-Zinc Oxide (RISAMINE) 0.44-20.625 % OINT Apply 1 application topically 4 (four) times daily as needed (for rash/redness).    . metoprolol succinate (TOPROL-XL) 50 MG 24 hr tablet Take 50 mg by mouth daily. (0800)Take with or immediately following a meal.    . Olopatadine HCl  (PATADAY) 0.2 % SOLN Place 1 drop into both eyes daily. (0800)    . Oxycodone HCl 10 MG TABS Take 1 tablet (10 mg total) by mouth every 8 (eight) hours as needed (FOR Pain).  0  . pantoprazole (PROTONIX) 40 MG tablet Take 1 tablet (40 mg total) by mouth 2 (two) times daily before a meal. 60 tablet 2  . potassium chloride (K-DUR,KLOR-CON) 10 MEQ tablet Take 2 tablets (20 mEq total) by mouth daily. (0800) 30 tablet 0  . tiZANidine (ZANAFLEX) 2 MG tablet Take 2 mg by mouth 2 (two) times daily. (0800 & 2000)    . traZODone (DESYREL) 50 MG tablet Take 50 mg by mouth daily at 8 pm.     . VOLTAREN 1 % GEL Apply 4 g topically every 12 (twelve) hours as needed. For knee pain     No current facility-administered medications for this visit.     Musculoskeletal: Strength & Muscle Tone: within normal limits Gait & Station: normal Patient leans: N/A  Psychiatric Specialty Exam: ROS  There were no vitals taken for this visit.There is no height or weight on file to calculate BMI.  General Appearance: Fairly Groomed  Eye Contact:  Good  Speech:  Clear and Coherent  Volume:  Normal  Mood:  {BHH MOOD:22306}  Affect:  {Affect (PAA):22687}  Thought Process:  Coherent  Orientation:  Full (Time, Place, and Person)  Thought Content:  Logical  Suicidal Thoughts:  {ST/HT (PAA):22692}  Homicidal Thoughts:  {ST/HT (PAA):22692}  Memory:  Immediate;   Good  Judgement:  {Judgement (PAA):22694}  Insight:  {Insight (PAA):22695}  Psychomotor Activity:  Normal  Concentration:  Concentration: Good and Attention Span: Good  Recall:  Good  Fund of Knowledge:Good  Language: Good  Akathisia:  No  Handed:  Right  AIMS (if indicated):  not done  Assets:  Communication Skills Desire for Improvement  ADL's:  Intact  Cognition: WNL  Sleep:  {BHH GOOD/FAIR/POOR:22877}   Screenings:   Assessment and Plan:  Assessment  Plan  The patient demonstrates the following risk factors for suicide: Chronic risk factors  for suicide include: {Chronic Risk Factors for ELFYBOF:75102585}. Acute risk factors for suicide include: {Acute Risk Factors for  TTCNGFR:43200379}. Protective factors for this patient include: {Protective Factors for Suicide KCCQ:19012224}. Considering these factors, the overall suicide risk at this point appears to be {Desc; low/moderate/high:110033}. Patient {ACTION; IS/IS VHO:64314276} appropriate for outpatient follow up.     Norman Clay, MD 2/18/202012:53 PM

## 2018-10-29 ENCOUNTER — Ambulatory Visit (HOSPITAL_COMMUNITY): Payer: Self-pay | Admitting: Psychiatry

## 2018-11-12 NOTE — Progress Notes (Deleted)
Psychiatric Initial Adult Assessment   Patient Identification: Kathleen Valenzuela MRN:  [id number] Date of Evaluation:  11/12/2018 Referral Source: DR. Monico Blitz Chief Complaint:   Visit Diagnosis: No diagnosis found.  History of Present Illness:   Kathleen Valenzuela is a 68 y.o. year old female with a history of bipolar disorder, dementia on aricept per chart review, COPD, hypertension, history of diverticulitis with abscess s/p percutaneous drainage , who is referred for     Depression Symptoms:  {DEPRESSION SYMPTOMS:20000} (Hypo) Manic Symptoms:  {BHH MANIC SYMPTOMS:22872} Anxiety Symptoms:  {BHH ANXIETY SYMPTOMS:22873} Psychotic Symptoms:  {BHH PSYCHOTIC SYMPTOMS:22874} PTSD Symptoms: {BHH PTSD SYMPTOMS:22875}  Past Psychiatric History:  Outpatient:  Psychiatry admission:  Previous suicide attempt:  Past trials of medication:  History of violence:   Previous Psychotropic Medications: {YES/NO:21197}  Substance Abuse History in the last 12 months:  {yes no:314532}  Consequences of Substance Abuse: {BHH CONSEQUENCES OF SUBSTANCE ABUSE:22880}  Past Medical History:  Past Medical History:  Diagnosis Date  . Anemia   . Anxiety   . Aortic atherosclerosis ([place]) 04/21/2017  . Arthritis   . Bipolar 1 disorder ([place])   . Chronic back pain   . COPD (chronic obstructive pulmonary disease) ([place])   . Diastolic dysfunction with heart failure ([place]) 04/20/2017  . Duodenal stricture   . Early onset Alzheimer's dementia   . GERD (gastroesophageal reflux disease)   . Heart murmur   . High cholesterol   . Hypertension   . Shortness of breath dyspnea     Past Surgical History:  Procedure Laterality Date  . BIOPSY  01/06/2017   Procedure: BIOPSY;  Surgeon: Rogene Houston, MD;  [facility];  Service: Endoscopy;;  esophageal biopsy  . CESAREAN SECTION    . COLONOSCOPY    . ESOPHAGEAL DILATION N/A 10/29/2015   Procedure: ESOPHAGEAL DILATION;  Surgeon: Rogene Houston, MD;   [facility];  Service: Endoscopy;  Laterality: N/A;  . ESOPHAGEAL DILATION N/A 01/29/2016   Procedure: ESOPHAGEAL DILATION;  Surgeon: Rogene Houston, MD;  [facility];  Service: Endoscopy;  Laterality: N/A;  . ESOPHAGEAL DILATION N/A 01/06/2017   Procedure: ESOPHAGEAL DILATION;  Surgeon: Rogene Houston, MD;  [facility];  Service: Endoscopy;  Laterality: N/A;  . ESOPHAGOGASTRODUODENOSCOPY N/A 10/29/2015   Procedure: ESOPHAGOGASTRODUODENOSCOPY (EGD);  Surgeon: Rogene Houston, MD;  [facility];  Service: Endoscopy;  Laterality: N/A;  1200  . ESOPHAGOGASTRODUODENOSCOPY (EGD) WITH PROPOFOL N/A 01/29/2016   Procedure: ESOPHAGOGASTRODUODENOSCOPY (EGD) WITH PROPOFOL;  Surgeon: Rogene Houston, MD;  [facility];  Service: Endoscopy;  Laterality: N/A;  7:30 - moved to 4/21 @11 : 25 - Ann notified pt to arrive at 10:00  . ESOPHAGOGASTRODUODENOSCOPY (EGD) WITH PROPOFOL N/A 01/06/2017   Procedure: ESOPHAGOGASTRODUODENOSCOPY (EGD) WITH PROPOFOL;  Surgeon: Rogene Houston, MD;  [facility];  Service: Endoscopy;  Laterality: N/A;  11:20  . ESOPHAGOGASTRODUODENOSCOPY (EGD) WITH PROPOFOL N/A 02/17/2017   Procedure: ESOPHAGOGASTRODUODENOSCOPY (EGD) WITH PROPOFOL;  Surgeon: Rogene Houston, MD;  [facility];  Service: Endoscopy;  Laterality: N/A;  10:30  . FOOT SURGERY Right    bunionectomy  . HEMORRHOID SURGERY      Family Psychiatric History: ***  Family History: No family history on file.  Social History:   Social History   Socioeconomic History  . Marital status: Divorced    Spouse name: Not on file  . Number of children: Not on file  .  Years of education: Not on file  . Highest education level: Not on file  Occupational History  . Not on file  Social Needs  . Financial resource strain: Not on file  . Food insecurity:    Worry: Not on file    Inability: Not on file  . Transportation needs:    Medical: Not on file     Non-medical: Not on file  Tobacco Use  . Smoking status: Former Smoker    Packs/day: 2.00    Years: 35.00    Pack years: 70.00    Types: Cigarettes    Last attempt to quit: 07/25/2015    Years since quitting: 3.3  . Smokeless tobacco: Never Used  . Tobacco comment: quit 2 weeks ago (November 2016)  Substance and Sexual Activity  . Alcohol use: No    Alcohol/week: 0.0 standard drinks  . Drug use: No  . Sexual activity: Never    Birth control/protection: None  Lifestyle  . Physical activity:    Days per week: Not on file    Minutes per session: Not on file  . Stress: Not on file  Relationships  . Social connections:    Talks on phone: Not on file    Gets together: Not on file    Attends religious service: Not on file    Active member of club or organization: Not on file    Attends meetings of clubs or organizations: Not on file    Relationship status: Not on file  Other Topics Concern  . Not on file  Social History Narrative  . Not on file    Additional Social History: ***  Allergies:   Allergies  Allergen Reactions  . Penicillins Rash    Has patient had a PCN reaction causing immediate rash, facial/tongue/throat swelling, SOB or lightheadedness with hypotension: Yes Has patient had a PCN reaction causing severe rash involving mucus membranes or skin necrosis: No Has patient had a PCN reaction that required hospitalization: No Has patient had a PCN reaction occurring within the last 10 years: No If all of the above answers are "NO", then may proceed with Cephalosporin use.     Metabolic Disorder Labs: Lab Results  Component Value Date   HGBA1C 5.2 04/15/2017   MPG 103 04/15/2017   No results found for: PROLACTIN No results found for: CHOL, TRIG, HDL, CHOLHDL, VLDL, LDLCALC No results found for: TSH  Therapeutic Level Labs: Lab Results  Component Value Date   LITHIUM 0.92 04/11/2017   No results found for: CBMZ No results found for:  VALPROATE  Current Medications: Current Outpatient Medications  Medication Sig Dispense Refill  . albuterol (PROVENTIL HFA;VENTOLIN HFA) 108 (90 BASE) MCG/ACT inhaler Inhale 2 puffs into the lungs 4 (four) times daily. (0800, 1200, 1600, & 2000)    . ALPRAZolam (XANAX) 1 MG tablet Take 0.5 tablets (0.5 mg total) by mouth 3 (three) times daily as needed for anxiety. (0800, 1400, & 2000) 1 tablet 0  . atorvastatin (LIPITOR) 10 MG tablet Take 10 mg by mouth daily at 8 pm.     . calcium carbonate (TUMS - DOSED IN MG ELEMENTAL CALCIUM) 500 MG chewable tablet Chew 2 tablets by mouth every 2 (two) hours as needed for indigestion (CHEW BEFORE SWALLOWING).    Marland Kitchen dicyclomine (BENTYL) 20 MG tablet Take 20 mg by mouth 4 (four) times daily. (0800, 1200, 1600, & 2000)    . docusate sodium (COLACE) 100 MG capsule Take 100 mg by mouth 2 (two)  times daily. (0800 & 2000)    . donepezil (ARICEPT) 10 MG tablet Take 10 mg by mouth daily at 8 pm.    . Ferrous Gluconate 324 (37.5 Fe) MG TABS Take 324 mg by mouth 2 (two) times daily. (0800 & 2000)    . fluticasone (FLONASE) 50 MCG/ACT nasal spray Place 1 spray into both nostrils daily. (0800)    . Fluticasone Furoate-Vilanterol (BREO ELLIPTA) 200-25 MCG/INH AEPB Inhale 1 puff into the lungs daily. (0800)    . furosemide (LASIX) 40 MG tablet Take 40 mg by mouth daily. (0800)    . isosorbide mononitrate (IMDUR) 30 MG 24 hr tablet Take 30 mg by mouth daily. (0800)    . lithium 300 MG tablet Take 300 mg by mouth 2 (two) times daily. (0800 & 2000)    . lithium carbonate 150 MG capsule Take 150 mg by mouth every morning. Take 150 mg capsule along with 300 mg tablet to equal 450 mg.    . Menthol-Zinc Oxide (RISAMINE) 0.44-20.625 % OINT Apply 1 application topically 4 (four) times daily as needed (for rash/redness).    . metoprolol succinate (TOPROL-XL) 50 MG 24 hr tablet Take 50 mg by mouth daily. (0800)Take with or immediately following a meal.    . Olopatadine HCl (PATADAY)  0.2 % SOLN Place 1 drop into both eyes daily. (0800)    . Oxycodone HCl 10 MG TABS Take 1 tablet (10 mg total) by mouth every 8 (eight) hours as needed (FOR Pain).  0  . pantoprazole (PROTONIX) 40 MG tablet Take 1 tablet (40 mg total) by mouth 2 (two) times daily before a meal. 60 tablet 2  . potassium chloride (K-DUR,KLOR-CON) 10 MEQ tablet Take 2 tablets (20 mEq total) by mouth daily. (0800) 30 tablet 0  . tiZANidine (ZANAFLEX) 2 MG tablet Take 2 mg by mouth 2 (two) times daily. (0800 & 2000)    . traZODone (DESYREL) 50 MG tablet Take 50 mg by mouth daily at 8 pm.     . VOLTAREN 1 % GEL Apply 4 g topically every 12 (twelve) hours as needed. For knee pain     No current facility-administered medications for this visit.     Musculoskeletal: Strength & Muscle Tone: within normal limits Gait & Station: normal Patient leans: N/A  Psychiatric Specialty Exam: ROS  There were no vitals taken for this visit.There is no height or weight on file to calculate BMI.  General Appearance: Fairly Groomed  Eye Contact:  Good  Speech:  Clear and Coherent  Volume:  Normal  Mood:  {BHH MOOD:22306}  Affect:  {Affect (PAA):22687}  Thought Process:  Coherent  Orientation:  Full (Time, Place, and Person)  Thought Content:  Logical  Suicidal Thoughts:  {ST/HT (PAA):22692}  Homicidal Thoughts:  {ST/HT (PAA):22692}  Memory:  Immediate;   Good  Judgement:  {Judgement (PAA):22694}  Insight:  {Insight (PAA):22695}  Psychomotor Activity:  Normal  Concentration:  Concentration: Good and Attention Span: Good  Recall:  Good  Fund of Knowledge:Good  Language: Good  Akathisia:  No  Handed:  Right  AIMS (if indicated):  not done  Assets:  Communication Skills Desire for Improvement  ADL's:  Intact  Cognition: WNL  Sleep:  {BHH GOOD/FAIR/POOR:22877}   Screenings:   Assessment and Plan:  Assessment  Plan  The patient demonstrates the following risk factors for suicide: Chronic risk factors for  suicide include: {Chronic Risk Factors for XFGHWEX:93716967}. Acute risk factors for suicide include: {Acute Risk Factors for  CBIPJRP:39688648}. Protective factors for this patient include: {Protective Factors for Suicide EFUW:72182883}. Considering these factors, the overall suicide risk at this point appears to be {Desc; low/moderate/high:110033}. Patient {ACTION; IS/IS DVO:45146047} appropriate for outpatient follow up.    Norman Clay, MD 3/9/202011:49 AM

## 2018-11-15 ENCOUNTER — Ambulatory Visit (HOSPITAL_COMMUNITY): Payer: Medicaid Other | Admitting: Psychiatry

## 2018-11-28 NOTE — Progress Notes (Deleted)
Psychiatric Initial Adult Assessment   Patient Identification: Kathleen Valenzuela MRN:  [id number] Date of Evaluation:  11/28/2018 Referral Source: *** Chief Complaint:   Visit Diagnosis: No diagnosis found.  History of Present Illness:   Kathleen Valenzuela is a 68 y.o. year old female with a history of bipolar disorder, dementia on aricept per chart review,  COPD, hypertension, history of diverticulitis with abscess s/p percutaneous drainage , who presents for follow up appointment for No diagnosis found.      Associated Signs/Symptoms: Depression Symptoms:  {DEPRESSION SYMPTOMS:20000} (Hypo) Manic Symptoms:  {BHH MANIC SYMPTOMS:22872} Anxiety Symptoms:  {BHH ANXIETY SYMPTOMS:22873} Psychotic Symptoms:  {BHH PSYCHOTIC SYMPTOMS:22874} PTSD Symptoms: {BHH PTSD SYMPTOMS:22875}  Past Psychiatric History:  Outpatient:  Psychiatry admission:  Previous suicide attempt:  Past trials of medication:  History of violence:   Previous Psychotropic Medications: {YES/NO:21197}  Substance Abuse History in the last 12 months:  {yes no:314532}  Consequences of Substance Abuse: {BHH CONSEQUENCES OF SUBSTANCE ABUSE:22880}  Past Medical History:  Past Medical History:  Diagnosis Date  . Anemia   . Anxiety   . Aortic atherosclerosis ([place]) 04/21/2017  . Arthritis   . Bipolar 1 disorder ([place])   . Chronic back pain   . COPD (chronic obstructive pulmonary disease) ([place])   . Diastolic dysfunction with heart failure ([place]) 04/20/2017  . Duodenal stricture   . Early onset Alzheimer's dementia   . GERD (gastroesophageal reflux disease)   . Heart murmur   . High cholesterol   . Hypertension   . Shortness of breath dyspnea     Past Surgical History:  Procedure Laterality Date  . BIOPSY  01/06/2017   Procedure: BIOPSY;  Surgeon: Rogene Houston, MD;  [facility];  Service: Endoscopy;;  esophageal biopsy  . CESAREAN SECTION    . COLONOSCOPY    . ESOPHAGEAL DILATION N/A 10/29/2015   Procedure: ESOPHAGEAL DILATION;  Surgeon: Rogene Houston, MD;  [facility];  Service: Endoscopy;  Laterality: N/A;  . ESOPHAGEAL DILATION N/A 01/29/2016   Procedure: ESOPHAGEAL DILATION;  Surgeon: Rogene Houston, MD;  [facility];  Service: Endoscopy;  Laterality: N/A;  . ESOPHAGEAL DILATION N/A 01/06/2017   Procedure: ESOPHAGEAL DILATION;  Surgeon: Rogene Houston, MD;  [facility];  Service: Endoscopy;  Laterality: N/A;  . ESOPHAGOGASTRODUODENOSCOPY N/A 10/29/2015   Procedure: ESOPHAGOGASTRODUODENOSCOPY (EGD);  Surgeon: Rogene Houston, MD;  [facility];  Service: Endoscopy;  Laterality: N/A;  1200  . ESOPHAGOGASTRODUODENOSCOPY (EGD) WITH PROPOFOL N/A 01/29/2016   Procedure: ESOPHAGOGASTRODUODENOSCOPY (EGD) WITH PROPOFOL;  Surgeon: Rogene Houston, MD;  [facility];  Service: Endoscopy;  Laterality: N/A;  7:30 - moved to 4/21 @11 : 25 - Ann notified pt to arrive at 10:00  . ESOPHAGOGASTRODUODENOSCOPY (EGD) WITH PROPOFOL N/A 01/06/2017   Procedure: ESOPHAGOGASTRODUODENOSCOPY (EGD) WITH PROPOFOL;  Surgeon: Rogene Houston, MD;  [facility];  Service: Endoscopy;  Laterality: N/A;  11:20  . ESOPHAGOGASTRODUODENOSCOPY (EGD) WITH PROPOFOL N/A 02/17/2017   Procedure: ESOPHAGOGASTRODUODENOSCOPY (EGD) WITH PROPOFOL;  Surgeon: Rogene Houston, MD;  [facility];  Service: Endoscopy;  Laterality: N/A;  10:30  . FOOT SURGERY Right    bunionectomy  . HEMORRHOID SURGERY      Family Psychiatric History: ***  Family History: No family history on file.  Social History:   Social History   Socioeconomic History  . Marital status: Divorced    Spouse name: Not on file  .  Number of children: Not on file  . Years of education: Not on file  . Highest education level: Not on file  Occupational History  . Not on file  Social Needs  . Financial resource strain: Not on file  . Food insecurity:    Worry: Not on file     Inability: Not on file  . Transportation needs:    Medical: Not on file    Non-medical: Not on file  Tobacco Use  . Smoking status: Former Smoker    Packs/day: 2.00    Years: 35.00    Pack years: 70.00    Types: Cigarettes    Last attempt to quit: 07/25/2015    Years since quitting: 3.3  . Smokeless tobacco: Never Used  . Tobacco comment: quit 2 weeks ago (November 2016)  Substance and Sexual Activity  . Alcohol use: No    Alcohol/week: 0.0 standard drinks  . Drug use: No  . Sexual activity: Never    Birth control/protection: None  Lifestyle  . Physical activity:    Days per week: Not on file    Minutes per session: Not on file  . Stress: Not on file  Relationships  . Social connections:    Talks on phone: Not on file    Gets together: Not on file    Attends religious service: Not on file    Active member of club or organization: Not on file    Attends meetings of clubs or organizations: Not on file    Relationship status: Not on file  Other Topics Concern  . Not on file  Social History Narrative  . Not on file    Additional Social History: ***  Allergies:   Allergies  Allergen Reactions  . Penicillins Rash    Has patient had a PCN reaction causing immediate rash, facial/tongue/throat swelling, SOB or lightheadedness with hypotension: Yes Has patient had a PCN reaction causing severe rash involving mucus membranes or skin necrosis: No Has patient had a PCN reaction that required hospitalization: No Has patient had a PCN reaction occurring within the last 10 years: No If all of the above answers are "NO", then may proceed with Cephalosporin use.     Metabolic Disorder Labs: Lab Results  Component Value Date   HGBA1C 5.2 04/15/2017   MPG 103 04/15/2017   No results found for: PROLACTIN No results found for: CHOL, TRIG, HDL, CHOLHDL, VLDL, LDLCALC No results found for: TSH  Therapeutic Level Labs: Lab Results  Component Value Date   LITHIUM 0.92  04/11/2017   No results found for: CBMZ No results found for: VALPROATE  Current Medications: Current Outpatient Medications  Medication Sig Dispense Refill  . albuterol (PROVENTIL HFA;VENTOLIN HFA) 108 (90 BASE) MCG/ACT inhaler Inhale 2 puffs into the lungs 4 (four) times daily. (0800, 1200, 1600, & 2000)    . ALPRAZolam (XANAX) 1 MG tablet Take 0.5 tablets (0.5 mg total) by mouth 3 (three) times daily as needed for anxiety. (0800, 1400, & 2000) 1 tablet 0  . atorvastatin (LIPITOR) 10 MG tablet Take 10 mg by mouth daily at 8 pm.     . calcium carbonate (TUMS - DOSED IN MG ELEMENTAL CALCIUM) 500 MG chewable tablet Chew 2 tablets by mouth every 2 (two) hours as needed for indigestion (CHEW BEFORE SWALLOWING).    Marland Kitchen dicyclomine (BENTYL) 20 MG tablet Take 20 mg by mouth 4 (four) times daily. (0800, 1200, 1600, & 2000)    . docusate sodium (COLACE) 100 MG  capsule Take 100 mg by mouth 2 (two) times daily. (0800 & 2000)    . donepezil (ARICEPT) 10 MG tablet Take 10 mg by mouth daily at 8 pm.    . Ferrous Gluconate 324 (37.5 Fe) MG TABS Take 324 mg by mouth 2 (two) times daily. (0800 & 2000)    . fluticasone (FLONASE) 50 MCG/ACT nasal spray Place 1 spray into both nostrils daily. (0800)    . Fluticasone Furoate-Vilanterol (BREO ELLIPTA) 200-25 MCG/INH AEPB Inhale 1 puff into the lungs daily. (0800)    . furosemide (LASIX) 40 MG tablet Take 40 mg by mouth daily. (0800)    . isosorbide mononitrate (IMDUR) 30 MG 24 hr tablet Take 30 mg by mouth daily. (0800)    . lithium 300 MG tablet Take 300 mg by mouth 2 (two) times daily. (0800 & 2000)    . lithium carbonate 150 MG capsule Take 150 mg by mouth every morning. Take 150 mg capsule along with 300 mg tablet to equal 450 mg.    . Menthol-Zinc Oxide (RISAMINE) 0.44-20.625 % OINT Apply 1 application topically 4 (four) times daily as needed (for rash/redness).    . metoprolol succinate (TOPROL-XL) 50 MG 24 hr tablet Take 50 mg by mouth daily. (0800)Take with  or immediately following a meal.    . Olopatadine HCl (PATADAY) 0.2 % SOLN Place 1 drop into both eyes daily. (0800)    . Oxycodone HCl 10 MG TABS Take 1 tablet (10 mg total) by mouth every 8 (eight) hours as needed (FOR Pain).  0  . pantoprazole (PROTONIX) 40 MG tablet Take 1 tablet (40 mg total) by mouth 2 (two) times daily before a meal. 60 tablet 2  . potassium chloride (K-DUR,KLOR-CON) 10 MEQ tablet Take 2 tablets (20 mEq total) by mouth daily. (0800) 30 tablet 0  . tiZANidine (ZANAFLEX) 2 MG tablet Take 2 mg by mouth 2 (two) times daily. (0800 & 2000)    . traZODone (DESYREL) 50 MG tablet Take 50 mg by mouth daily at 8 pm.     . VOLTAREN 1 % GEL Apply 4 g topically every 12 (twelve) hours as needed. For knee pain     No current facility-administered medications for this visit.     Musculoskeletal: Strength & Muscle Tone: within normal limits Gait & Station: normal Patient leans: N/A  Psychiatric Specialty Exam: ROS  There were no vitals taken for this visit.There is no height or weight on file to calculate BMI.  General Appearance: Fairly Groomed  Eye Contact:  Good  Speech:  Clear and Coherent  Volume:  Normal  Mood:  {BHH MOOD:22306}  Affect:  {Affect (PAA):22687}  Thought Process:  Coherent  Orientation:  Full (Time, Place, and Person)  Thought Content:  Logical  Suicidal Thoughts:  {ST/HT (PAA):22692}  Homicidal Thoughts:  {ST/HT (PAA):22692}  Memory:  Immediate;   Good  Judgement:  {Judgement (PAA):22694}  Insight:  {Insight (PAA):22695}  Psychomotor Activity:  Normal  Concentration:  Concentration: Good and Attention Span: Good  Recall:  Good  Fund of Knowledge:Good  Language: Good  Akathisia:  No  Handed:  Right  AIMS (if indicated):  not done  Assets:  Communication Skills Desire for Improvement  ADL's:  Intact  Cognition: {chl bhh cognition:304700322}  Sleep:  {BHH GOOD/FAIR/POOR:22877}   Screenings:   Assessment and Plan:  Assessment  Plan  The  patient demonstrates the following risk factors for suicide: Chronic risk factors for suicide include: {Chronic Risk Factors for VOZDGUY:40347425}.  Acute risk factors for suicide include: {Acute Risk Factors for TTSVXBL:39030092}. Protective factors for this patient include: {Protective Factors for Suicide ZRAQ:76226333}. Considering these factors, the overall suicide risk at this point appears to be {Desc; low/moderate/high:110033}. Patient {ACTION; IS/IS LKT:62563893} appropriate for outpatient follow up.     Norman Clay, MD 3/25/20204:03 PM

## 2018-12-04 ENCOUNTER — Ambulatory Visit (HOSPITAL_COMMUNITY): Payer: Medicare Other | Admitting: Psychiatry

## 2019-09-26 DIAGNOSIS — Z299 Encounter for prophylactic measures, unspecified: Secondary | ICD-10-CM | POA: Diagnosis not present

## 2019-09-26 DIAGNOSIS — J449 Chronic obstructive pulmonary disease, unspecified: Secondary | ICD-10-CM | POA: Diagnosis not present

## 2019-09-26 DIAGNOSIS — W19XXXA Unspecified fall, initial encounter: Secondary | ICD-10-CM | POA: Diagnosis not present

## 2019-10-04 DIAGNOSIS — I10 Essential (primary) hypertension: Secondary | ICD-10-CM | POA: Diagnosis not present

## 2019-10-04 DIAGNOSIS — R404 Transient alteration of awareness: Secondary | ICD-10-CM | POA: Diagnosis not present

## 2019-10-04 DIAGNOSIS — Z03818 Encounter for observation for suspected exposure to other biological agents ruled out: Secondary | ICD-10-CM | POA: Diagnosis not present

## 2019-10-04 DIAGNOSIS — G9341 Metabolic encephalopathy: Secondary | ICD-10-CM | POA: Diagnosis not present

## 2019-10-04 DIAGNOSIS — R4781 Slurred speech: Secondary | ICD-10-CM | POA: Diagnosis not present

## 2019-10-04 DIAGNOSIS — R52 Pain, unspecified: Secondary | ICD-10-CM | POA: Diagnosis not present

## 2019-10-04 DIAGNOSIS — J449 Chronic obstructive pulmonary disease, unspecified: Secondary | ICD-10-CM | POA: Diagnosis not present

## 2019-10-04 DIAGNOSIS — R519 Headache, unspecified: Secondary | ICD-10-CM | POA: Diagnosis not present

## 2019-10-04 DIAGNOSIS — R410 Disorientation, unspecified: Secondary | ICD-10-CM | POA: Diagnosis not present

## 2019-10-04 DIAGNOSIS — N390 Urinary tract infection, site not specified: Secondary | ICD-10-CM | POA: Diagnosis not present

## 2019-10-04 DIAGNOSIS — Z20822 Contact with and (suspected) exposure to covid-19: Secondary | ICD-10-CM | POA: Diagnosis not present

## 2019-10-04 DIAGNOSIS — G4489 Other headache syndrome: Secondary | ICD-10-CM | POA: Diagnosis not present

## 2019-10-22 DIAGNOSIS — M48061 Spinal stenosis, lumbar region without neurogenic claudication: Secondary | ICD-10-CM | POA: Diagnosis not present

## 2019-10-22 DIAGNOSIS — M5416 Radiculopathy, lumbar region: Secondary | ICD-10-CM | POA: Diagnosis not present

## 2019-10-22 DIAGNOSIS — I10 Essential (primary) hypertension: Secondary | ICD-10-CM | POA: Diagnosis not present

## 2019-10-25 DIAGNOSIS — Z20822 Contact with and (suspected) exposure to covid-19: Secondary | ICD-10-CM | POA: Diagnosis not present

## 2019-10-25 DIAGNOSIS — E869 Volume depletion, unspecified: Secondary | ICD-10-CM | POA: Diagnosis not present

## 2019-10-25 DIAGNOSIS — N3000 Acute cystitis without hematuria: Secondary | ICD-10-CM | POA: Diagnosis not present

## 2019-10-25 DIAGNOSIS — R0689 Other abnormalities of breathing: Secondary | ICD-10-CM | POA: Diagnosis not present

## 2019-10-25 DIAGNOSIS — R000 Tachycardia, unspecified: Secondary | ICD-10-CM | POA: Diagnosis not present

## 2019-10-25 DIAGNOSIS — R404 Transient alteration of awareness: Secondary | ICD-10-CM | POA: Diagnosis not present

## 2019-10-25 DIAGNOSIS — R069 Unspecified abnormalities of breathing: Secondary | ICD-10-CM | POA: Diagnosis not present

## 2019-10-25 DIAGNOSIS — Z888 Allergy status to other drugs, medicaments and biological substances status: Secondary | ICD-10-CM | POA: Diagnosis not present

## 2019-10-25 DIAGNOSIS — Z886 Allergy status to analgesic agent status: Secondary | ICD-10-CM | POA: Diagnosis not present

## 2019-10-25 DIAGNOSIS — Z880 Allergy status to penicillin: Secondary | ICD-10-CM | POA: Diagnosis not present

## 2019-10-25 DIAGNOSIS — J449 Chronic obstructive pulmonary disease, unspecified: Secondary | ICD-10-CM | POA: Diagnosis not present

## 2019-10-30 DIAGNOSIS — N390 Urinary tract infection, site not specified: Secondary | ICD-10-CM | POA: Diagnosis not present

## 2019-10-30 DIAGNOSIS — J449 Chronic obstructive pulmonary disease, unspecified: Secondary | ICD-10-CM | POA: Diagnosis not present

## 2019-10-30 DIAGNOSIS — Z299 Encounter for prophylactic measures, unspecified: Secondary | ICD-10-CM | POA: Diagnosis not present

## 2019-10-30 DIAGNOSIS — I10 Essential (primary) hypertension: Secondary | ICD-10-CM | POA: Diagnosis not present

## 2019-10-30 DIAGNOSIS — Z789 Other specified health status: Secondary | ICD-10-CM | POA: Diagnosis not present

## 2019-11-04 DIAGNOSIS — Z713 Dietary counseling and surveillance: Secondary | ICD-10-CM | POA: Diagnosis not present

## 2019-11-04 DIAGNOSIS — N390 Urinary tract infection, site not specified: Secondary | ICD-10-CM | POA: Diagnosis not present

## 2019-11-04 DIAGNOSIS — Z299 Encounter for prophylactic measures, unspecified: Secondary | ICD-10-CM | POA: Diagnosis not present

## 2019-11-13 DIAGNOSIS — I10 Essential (primary) hypertension: Secondary | ICD-10-CM | POA: Diagnosis not present

## 2019-11-13 DIAGNOSIS — Z9181 History of falling: Secondary | ICD-10-CM | POA: Diagnosis not present

## 2019-11-13 DIAGNOSIS — F17210 Nicotine dependence, cigarettes, uncomplicated: Secondary | ICD-10-CM | POA: Diagnosis not present

## 2019-11-13 DIAGNOSIS — Z299 Encounter for prophylactic measures, unspecified: Secondary | ICD-10-CM | POA: Diagnosis not present

## 2019-12-09 ENCOUNTER — Encounter (HOSPITAL_COMMUNITY): Payer: Self-pay

## 2019-12-09 ENCOUNTER — Other Ambulatory Visit: Payer: Self-pay

## 2019-12-09 ENCOUNTER — Emergency Department (HOSPITAL_COMMUNITY): Payer: Medicare Other

## 2019-12-09 ENCOUNTER — Emergency Department (HOSPITAL_COMMUNITY)
Admission: EM | Admit: 2019-12-09 | Discharge: 2019-12-09 | Disposition: A | Discharge status: Home / Self Care | Payer: Medicare Other | Attending: Emergency Medicine | Admitting: Emergency Medicine

## 2019-12-09 DIAGNOSIS — I2510 Atherosclerotic heart disease of native coronary artery without angina pectoris: Secondary | ICD-10-CM | POA: Diagnosis not present

## 2019-12-09 DIAGNOSIS — R112 Nausea with vomiting, unspecified: Secondary | ICD-10-CM | POA: Insufficient documentation

## 2019-12-09 DIAGNOSIS — I110 Hypertensive heart disease with heart failure: Secondary | ICD-10-CM | POA: Insufficient documentation

## 2019-12-09 DIAGNOSIS — I5032 Chronic diastolic (congestive) heart failure: Secondary | ICD-10-CM | POA: Diagnosis not present

## 2019-12-09 DIAGNOSIS — N3000 Acute cystitis without hematuria: Secondary | ICD-10-CM | POA: Insufficient documentation

## 2019-12-09 DIAGNOSIS — J449 Chronic obstructive pulmonary disease, unspecified: Secondary | ICD-10-CM | POA: Diagnosis not present

## 2019-12-09 DIAGNOSIS — R1110 Vomiting, unspecified: Secondary | ICD-10-CM | POA: Diagnosis not present

## 2019-12-09 DIAGNOSIS — I10 Essential (primary) hypertension: Secondary | ICD-10-CM | POA: Diagnosis not present

## 2019-12-09 DIAGNOSIS — Z87891 Personal history of nicotine dependence: Secondary | ICD-10-CM | POA: Diagnosis not present

## 2019-12-09 DIAGNOSIS — R197 Diarrhea, unspecified: Secondary | ICD-10-CM | POA: Diagnosis not present

## 2019-12-09 LAB — CBC
HCT: 44.5 % (ref 36.0–46.0)
Hemoglobin: 14.8 g/dL (ref 12.0–15.0)
MCH: 30.8 pg (ref 26.0–34.0)
MCHC: 33.3 g/dL (ref 30.0–36.0)
MCV: 92.5 fL (ref 80.0–100.0)
Platelets: 273 K/uL (ref 150–400)
RBC: 4.81 MIL/uL (ref 3.87–5.11)
RDW: 13.5 % (ref 11.5–15.5)
WBC: 19.2 K/uL — ABNORMAL HIGH (ref 4.0–10.5)
nRBC: 0 % (ref 0.0–0.2)

## 2019-12-09 LAB — URINALYSIS, ROUTINE W REFLEX MICROSCOPIC
Bilirubin Urine: NEGATIVE
Glucose, UA: NEGATIVE mg/dL
Ketones, ur: NEGATIVE mg/dL
Nitrite: NEGATIVE
Protein, ur: NEGATIVE mg/dL
Specific Gravity, Urine: >1.046 — ABNORMAL HIGH (ref 1.005–1.030)
WBC, UA: >50 WBC/hpf — ABNORMAL HIGH (ref 0–5)
pH: 7 (ref 5.0–8.0)

## 2019-12-09 LAB — COMPREHENSIVE METABOLIC PANEL WITH GFR
ALT: 13 U/L (ref 0–44)
AST: 21 U/L (ref 15–41)
Albumin: 3.4 g/dL — ABNORMAL LOW (ref 3.5–5.0)
Alkaline Phosphatase: 107 U/L (ref 38–126)
Anion gap: 11 (ref 5–15)
BUN: 12 mg/dL (ref 8–23)
CO2: 24 mmol/L (ref 22–32)
Calcium: 9.6 mg/dL (ref 8.9–10.3)
Chloride: 105 mmol/L (ref 98–111)
Creatinine, Ser: 0.73 mg/dL (ref 0.44–1.00)
GFR calc Af Amer: >60 mL/min
GFR calc non Af Amer: >60 mL/min
Glucose, Bld: 120 mg/dL — ABNORMAL HIGH (ref 70–99)
Potassium: 3 mmol/L — ABNORMAL LOW (ref 3.5–5.1)
Sodium: 140 mmol/L (ref 135–145)
Total Bilirubin: 0.6 mg/dL (ref 0.3–1.2)
Total Protein: 7 g/dL (ref 6.5–8.1)

## 2019-12-09 LAB — LIPASE, BLOOD: Lipase: 19 U/L (ref 11–51)

## 2019-12-09 MED ORDER — POTASSIUM CHLORIDE CRYS ER 20 MEQ PO TBCR
40.0000 meq | EXTENDED_RELEASE_TABLET | Freq: Once | ORAL | Status: AC
  Administered 2019-12-09: 40 meq via ORAL
  Filled 2019-12-09: qty 2

## 2019-12-09 MED ORDER — CIPROFLOXACIN HCL 250 MG PO TABS: 250.0000 mg | ORAL_TABLET | Freq: Two times a day (BID) | ORAL | 0 refills | Status: DC

## 2019-12-09 MED ORDER — IOHEXOL 300 MG/ML  SOLN
100.0000 mL | Freq: Once | INTRAMUSCULAR | Status: AC | PRN
  Administered 2019-12-09: 100 mL via INTRAVENOUS

## 2019-12-09 MED ORDER — CIPROFLOXACIN HCL 250 MG PO TABS
500.0000 mg | ORAL_TABLET | Freq: Once | ORAL | Status: AC
  Administered 2019-12-09: 19:00:00 500 mg via ORAL
  Filled 2019-12-09: qty 2

## 2019-12-09 MED ORDER — POTASSIUM CHLORIDE CRYS ER 20 MEQ PO TBCR: 40.0000 meq | EXTENDED_RELEASE_TABLET | Freq: Once | ORAL | Status: DC

## 2019-12-09 NOTE — Progress Notes (Signed)
CSW has reviewed chart and notes that patient does not have a primary care doctor. CSW visited patient at bedside to address the matter. Patient states that her primary care provider is Dr. Brigitte Pulse. CSW will update chart. No further needs at this time.   [facility]  Clinical Social Worker  Ph: [phone]

## 2019-12-09 NOTE — ED Triage Notes (Signed)
Pt has had diarrhea for the last 2 weeks. Vomited this morning as well. Pt states she feels generally weak. Afebrile.

## 2019-12-09 NOTE — Discharge Instructions (Addendum)
Drink plenty of fluids and follow-up with your family doctor this week

## 2019-12-09 NOTE — ED Provider Notes (Signed)
CT scan shows some changes on her kidneys possible infection.  Urinalysis shows UTI.  We will culture the urine she will be put on Cipro to help with UTI and she will follow-up with her PCP   Milton Ferguson, MD 12/09/19 [phone]

## 2019-12-09 NOTE — ED Provider Notes (Signed)
[facility] Emergency Department Provider Note MRN:  [id number]  Arrival date & time: 12/09/19     Chief Complaint   Diarrhea   History of Present Illness   Kathleen Valenzuela is a 69 y.o. year-old female with a history of COPD, CHF, GERD, hypertension presenting to the ED with chief complaint of diarrhea.  2 weeks of intermittent watery diarrhea.  No recent antibiotics, no recent travel, no blood in the stool.  One episode of nonbloody nonbilious emesis this morning.  Denies abdominal pain.  Endorsing chronic low back pain for years.  Also endorsing depression, however denies SI, no HI, no AVH.  Taking her medicines like normal.  Review of Systems  A complete 10 system review of systems was obtained and all systems are negative except as noted in the HPI and PMH.   Patient's Health History    Past Medical History:  Diagnosis Date  . Anemia   . Anxiety   . Aortic atherosclerosis ([place]) 04/21/2017  . Arthritis   . Bipolar 1 disorder ([place])   . Chronic back pain   . COPD (chronic obstructive pulmonary disease) ([place])   . Diastolic dysfunction with heart failure ([place]) 04/20/2017  . Duodenal stricture   . Early onset Alzheimer's dementia ([place])   . GERD (gastroesophageal reflux disease)   . Heart murmur   . High cholesterol   . Hypertension   . Shortness of breath dyspnea     Past Surgical History:  Procedure Laterality Date  . BIOPSY  01/06/2017   Procedure: BIOPSY;  Surgeon: Rogene Houston, MD;  [facility]: [facility];  Service: Endoscopy;;  esophageal biopsy  . CESAREAN SECTION    . COLONOSCOPY    . ESOPHAGEAL DILATION N/A 10/29/2015   Procedure: ESOPHAGEAL DILATION;  Surgeon: Rogene Houston, MD;  [facility];  Service: Endoscopy;  Laterality: N/A;  . ESOPHAGEAL DILATION N/A 01/29/2016   Procedure: ESOPHAGEAL DILATION;  Surgeon: Rogene Houston, MD;  [facility];  Service: Endoscopy;  Laterality: N/A;  . ESOPHAGEAL DILATION N/A  01/06/2017   Procedure: ESOPHAGEAL DILATION;  Surgeon: Rogene Houston, MD;  [facility];  Service: Endoscopy;  Laterality: N/A;  . ESOPHAGOGASTRODUODENOSCOPY N/A 10/29/2015   Procedure: ESOPHAGOGASTRODUODENOSCOPY (EGD);  Surgeon: Rogene Houston, MD;  [facility];  Service: Endoscopy;  Laterality: N/A;  1200  . ESOPHAGOGASTRODUODENOSCOPY (EGD) WITH PROPOFOL N/A 01/29/2016   Procedure: ESOPHAGOGASTRODUODENOSCOPY (EGD) WITH PROPOFOL;  Surgeon: Rogene Houston, MD;  [facility];  Service: Endoscopy;  Laterality: N/A;  7:30 - moved to 4/21 @11 : 25 - Ann notified pt to arrive at 10:00  . ESOPHAGOGASTRODUODENOSCOPY (EGD) WITH PROPOFOL N/A 01/06/2017   Procedure: ESOPHAGOGASTRODUODENOSCOPY (EGD) WITH PROPOFOL;  Surgeon: Rogene Houston, MD;  [facility];  Service: Endoscopy;  Laterality: N/A;  11:20  . ESOPHAGOGASTRODUODENOSCOPY (EGD) WITH PROPOFOL N/A 02/17/2017   Procedure: ESOPHAGOGASTRODUODENOSCOPY (EGD) WITH PROPOFOL;  Surgeon: Rogene Houston, MD;  [facility];  Service: Endoscopy;  Laterality: N/A;  10:30  . FOOT SURGERY Right    bunionectomy  . HEMORRHOID SURGERY      No family history on file.  Social History   Socioeconomic History  . Marital status: Divorced    Spouse name: Not on file  . Number of children: Not on file  . Years of education: Not on file  . Highest education level: Not on file  Occupational History  . Not on file  Tobacco Use  . Smoking status: Former Smoker    Packs/day: 2.00    Years: 35.00    Pack years: 70.00    Types: Cigarettes    Quit date: 07/25/2015    Years since quitting: 4.3  . Smokeless tobacco: Never Used  . Tobacco comment: quit 2 weeks ago (November 2016)  Substance and Sexual Activity  . Alcohol use: No    Alcohol/week: 0.0 standard drinks  . Drug use: No  . Sexual activity: Never    Birth control/protection: None  Other Topics Concern  . Not on file  Social History Narrative  .  Not on file   Social Determinants of Health   Financial Resource Strain:   . Difficulty of Paying Living Expenses:   Food Insecurity:   . Worried About Charity fundraiser in the Last Year:   . Arboriculturist in the Last Year:   Transportation Needs:   . Film/video editor (Medical):   Marland Kitchen Lack of Transportation (Non-Medical):   Physical Activity:   . Days of Exercise per Week:   . Minutes of Exercise per Session:   Stress:   . Feeling of Stress :   Social Connections:   . Frequency of Communication with Friends and Family:   . Frequency of Social Gatherings with Friends and Family:   . Attends Religious Services:   . Active Member of Clubs or Organizations:   . Attends Archivist Meetings:   Marland Kitchen Marital Status:   Intimate Partner Violence:   . Fear of Current or Ex-Partner:   . Emotionally Abused:   Marland Kitchen Physically Abused:   . Sexually Abused:      Physical Exam   Vitals:   12/09/19 1127  BP: (!) 144/84  Pulse: 94  Resp: 16  Temp: 97.7 F (36.5 C)  SpO2: 96%    CONSTITUTIONAL: Well-appearing, NAD NEURO:  Alert and oriented x 3, no focal deficits EYES:  eyes equal and reactive ENT/NECK:  no LAD, no JVD CARDIO: Regular rate, well-perfused, normal S1 and S2 PULM:  CTAB no wheezing or rhonchi GI/GU:  normal bowel sounds, non-distended, non-tender MSK/SPINE:  No gross deformities, no edema SKIN:  no rash, atraumatic PSYCH: Mildly depressed, intermittently tearful  *Additional and/or pertinent findings included in MDM below  Diagnostic and Interventional Summary    EKG Interpretation  Date/Time:  Monday December 09 2019 12:57:02 EDT Ventricular Rate:  75 PR Interval:    QRS Duration: 139 QT Interval:  441 QTC Calculation: 493 R Axis:   -32 Text Interpretation: Sinus rhythm Borderline prolonged PR interval Left bundle branch block Confirmed by Gerlene Fee [phone]) on 12/09/2019 1:22:38 PM      Labs Reviewed  CBC - Abnormal; Notable for the  following components:      Result Value   WBC 19.2 (*)    All other components within normal limits  COMPREHENSIVE METABOLIC PANEL - Abnormal; Notable for the following components:   Potassium 3.0 (*)    Glucose, Bld 120 (*)    Albumin 3.4 (*)    All other components within normal limits  LIPASE, BLOOD    DG ABD ACUTE 2+V W 1V CHEST  Final Result    CT ABDOMEN PELVIS W CONTRAST    (Results Pending)    Medications  potassium chloride SA (KLOR-CON) CR tablet 40 mEq (has no administration in time range)     Procedures  /  Critical Care Procedures  ED Course and Medical Decision  Making  I have reviewed the triage vital signs, the nursing notes, and pertinent available records from the EMR.  Pertinent labs & imaging results that were available during my care of the patient were reviewed by me and considered in my medical decision making (see below for details).     Diarrhea with single episode of emesis today, completely benign abdomen, normal vital signs, no fever.  Will obtain labs to exclude electrolyte disturbance, plain film, reassess.  Patient seems to be depressed but does not appear to be in imminent threat to herself or others, would be appropriate for outpatient management.  Work-up reveals leukocytosis.  Given patient's age, risk factors, will obtain CT abdomen to exclude acute process such as diverticulitis.  Signed out to oncoming provider at shift change.  Barth Kirks. Sedonia Small, MD [place] [email or web address]  Final Clinical Impressions(s) / ED Diagnoses     ICD-10-CM   1. Nausea vomiting and diarrhea  R11.2    R19.7   2. Emesis  R11.10 DG ABD ACUTE 2+V W 1V CHEST    DG ABD ACUTE 2+V W 1V CHEST    ED Discharge Orders    None       Discharge Instructions Discussed with and Provided to Patient:   Discharge Instructions   None       Maudie Flakes, MD 12/09/19 1439

## 2019-12-09 NOTE — ED Notes (Signed)
Attempted to call pt contacts, not available. Called [facility], Per [facility] pt no longer a resident. Unable to contact pt family.

## 2019-12-12 LAB — URINE CULTURE: Culture: 30000 — AB

## 2019-12-13 ENCOUNTER — Telehealth: Payer: Self-pay | Admitting: *Deleted

## 2019-12-13 NOTE — Telephone Encounter (Signed)
Post ED Visit - Positive Culture Follow-up: Successful Patient Follow-Up  Culture assessed and recommendations reviewed by:  []  Elenor Quinones, Pharm.D. []  Heide Guile, Pharm.D., BCPS AQ-ID []  Parks Neptune, Pharm.D., BCPS []  Alycia Rossetti, Pharm.D., BCPS []  [place], [place].D., BCPS, AAHIVP []  Legrand Como, Pharm.D., BCPS, AAHIVP []  Salome Arnt, PharmD, BCPS []  Johnnette Gourd, PharmD, BCPS []  Hughes Better, PharmD, BCPS []  Leeroy Cha, PharmD  Positive urine culture  []  Patient discharged without antimicrobial prescription and treatment is now indicated []  Organism is resistant to prescribed ED discharge antimicrobial []  Patient with positive blood cultures  Changes discussed with ED provider Providence Lanius, [facility] Plan:  Stop Ciprofloxacin, no new antibiotics  Contacted son of patient, date 12/13/2019, time [place] 12/13/2019, 12:14 PM

## 2019-12-13 NOTE — Progress Notes (Addendum)
Entered in error - please disregard

## 2019-12-26 DIAGNOSIS — M5416 Radiculopathy, lumbar region: Secondary | ICD-10-CM | POA: Diagnosis not present

## 2019-12-26 DIAGNOSIS — M48061 Spinal stenosis, lumbar region without neurogenic claudication: Secondary | ICD-10-CM | POA: Diagnosis not present

## 2019-12-26 DIAGNOSIS — I10 Essential (primary) hypertension: Secondary | ICD-10-CM | POA: Diagnosis not present

## 2019-12-26 DIAGNOSIS — M4150 Other secondary scoliosis, site unspecified: Secondary | ICD-10-CM | POA: Diagnosis not present

## 2020-01-29 DIAGNOSIS — M4726 Other spondylosis with radiculopathy, lumbar region: Secondary | ICD-10-CM | POA: Diagnosis not present

## 2020-01-29 DIAGNOSIS — M5416 Radiculopathy, lumbar region: Secondary | ICD-10-CM | POA: Diagnosis not present

## 2020-02-28 DIAGNOSIS — I10 Essential (primary) hypertension: Secondary | ICD-10-CM | POA: Diagnosis not present

## 2020-02-28 DIAGNOSIS — Z299 Encounter for prophylactic measures, unspecified: Secondary | ICD-10-CM | POA: Diagnosis not present

## 2020-02-28 DIAGNOSIS — F17210 Nicotine dependence, cigarettes, uncomplicated: Secondary | ICD-10-CM | POA: Diagnosis not present

## 2020-04-02 DIAGNOSIS — M4726 Other spondylosis with radiculopathy, lumbar region: Secondary | ICD-10-CM | POA: Diagnosis not present

## 2020-04-02 DIAGNOSIS — M5416 Radiculopathy, lumbar region: Secondary | ICD-10-CM | POA: Diagnosis not present

## 2020-04-02 DIAGNOSIS — I10 Essential (primary) hypertension: Secondary | ICD-10-CM | POA: Diagnosis not present

## 2020-04-02 DIAGNOSIS — M4150 Other secondary scoliosis, site unspecified: Secondary | ICD-10-CM | POA: Diagnosis not present

## 2020-04-03 DIAGNOSIS — J449 Chronic obstructive pulmonary disease, unspecified: Secondary | ICD-10-CM | POA: Diagnosis not present

## 2020-04-03 DIAGNOSIS — I10 Essential (primary) hypertension: Secondary | ICD-10-CM | POA: Diagnosis not present

## 2020-04-03 DIAGNOSIS — E785 Hyperlipidemia, unspecified: Secondary | ICD-10-CM | POA: Diagnosis not present

## 2020-04-03 DIAGNOSIS — Z720 Tobacco use: Secondary | ICD-10-CM | POA: Diagnosis not present

## 2020-04-08 DIAGNOSIS — Z299 Encounter for prophylactic measures, unspecified: Secondary | ICD-10-CM | POA: Diagnosis not present

## 2020-04-08 DIAGNOSIS — I10 Essential (primary) hypertension: Secondary | ICD-10-CM | POA: Diagnosis not present

## 2020-04-08 DIAGNOSIS — F17210 Nicotine dependence, cigarettes, uncomplicated: Secondary | ICD-10-CM | POA: Diagnosis not present

## 2020-04-08 DIAGNOSIS — J449 Chronic obstructive pulmonary disease, unspecified: Secondary | ICD-10-CM | POA: Diagnosis not present

## 2020-04-13 ENCOUNTER — Encounter (HOSPITAL_COMMUNITY): Payer: Self-pay | Admitting: Emergency Medicine

## 2020-04-13 ENCOUNTER — Other Ambulatory Visit: Payer: Self-pay

## 2020-04-13 ENCOUNTER — Emergency Department (HOSPITAL_COMMUNITY): Payer: Medicare Other

## 2020-04-13 ENCOUNTER — Inpatient Hospital Stay (HOSPITAL_COMMUNITY)
Admission: EM | Admit: 2020-04-13 | Discharge: 2020-04-15 | DRG: 309 | Disposition: A | Discharge status: Skilled Nursing Facility | Payer: Medicare Other | Source: Skilled Nursing Facility | Attending: Internal Medicine | Admitting: Internal Medicine

## 2020-04-13 DIAGNOSIS — E7800 Pure hypercholesterolemia, unspecified: Secondary | ICD-10-CM | POA: Diagnosis present

## 2020-04-13 DIAGNOSIS — I214 Non-ST elevation (NSTEMI) myocardial infarction: Secondary | ICD-10-CM

## 2020-04-13 DIAGNOSIS — I10 Essential (primary) hypertension: Secondary | ICD-10-CM | POA: Diagnosis present

## 2020-04-13 DIAGNOSIS — Z20822 Contact with and (suspected) exposure to covid-19: Secondary | ICD-10-CM | POA: Diagnosis not present

## 2020-04-13 DIAGNOSIS — G300 Alzheimer's disease with early onset: Secondary | ICD-10-CM | POA: Diagnosis present

## 2020-04-13 DIAGNOSIS — F0281 Dementia in other diseases classified elsewhere with behavioral disturbance: Secondary | ICD-10-CM | POA: Diagnosis not present

## 2020-04-13 DIAGNOSIS — Z791 Long term (current) use of non-steroidal anti-inflammatories (NSAID): Secondary | ICD-10-CM

## 2020-04-13 DIAGNOSIS — I5032 Chronic diastolic (congestive) heart failure: Secondary | ICD-10-CM | POA: Diagnosis not present

## 2020-04-13 DIAGNOSIS — F319 Bipolar disorder, unspecified: Secondary | ICD-10-CM | POA: Diagnosis present

## 2020-04-13 DIAGNOSIS — A419 Sepsis, unspecified organism: Secondary | ICD-10-CM | POA: Diagnosis not present

## 2020-04-13 DIAGNOSIS — M546 Pain in thoracic spine: Secondary | ICD-10-CM | POA: Diagnosis not present

## 2020-04-13 DIAGNOSIS — I509 Heart failure, unspecified: Secondary | ICD-10-CM | POA: Diagnosis present

## 2020-04-13 DIAGNOSIS — K219 Gastro-esophageal reflux disease without esophagitis: Secondary | ICD-10-CM | POA: Diagnosis not present

## 2020-04-13 DIAGNOSIS — I110 Hypertensive heart disease with heart failure: Secondary | ICD-10-CM | POA: Diagnosis not present

## 2020-04-13 DIAGNOSIS — I4892 Unspecified atrial flutter: Principal | ICD-10-CM | POA: Diagnosis present

## 2020-04-13 DIAGNOSIS — I351 Nonrheumatic aortic (valve) insufficiency: Secondary | ICD-10-CM | POA: Diagnosis not present

## 2020-04-13 DIAGNOSIS — I700 Atherosclerosis of aorta: Secondary | ICD-10-CM | POA: Diagnosis not present

## 2020-04-13 DIAGNOSIS — F419 Anxiety disorder, unspecified: Secondary | ICD-10-CM | POA: Diagnosis present

## 2020-04-13 DIAGNOSIS — R319 Hematuria, unspecified: Secondary | ICD-10-CM | POA: Diagnosis not present

## 2020-04-13 DIAGNOSIS — J449 Chronic obstructive pulmonary disease, unspecified: Secondary | ICD-10-CM | POA: Diagnosis present

## 2020-04-13 DIAGNOSIS — F0280 Dementia in other diseases classified elsewhere without behavioral disturbance: Secondary | ICD-10-CM | POA: Diagnosis present

## 2020-04-13 DIAGNOSIS — R945 Abnormal results of liver function studies: Secondary | ICD-10-CM | POA: Diagnosis not present

## 2020-04-13 DIAGNOSIS — Z880 Allergy status to penicillin: Secondary | ICD-10-CM | POA: Diagnosis not present

## 2020-04-13 DIAGNOSIS — E785 Hyperlipidemia, unspecified: Secondary | ICD-10-CM | POA: Diagnosis present

## 2020-04-13 DIAGNOSIS — Z79891 Long term (current) use of opiate analgesic: Secondary | ICD-10-CM

## 2020-04-13 DIAGNOSIS — M1990 Unspecified osteoarthritis, unspecified site: Secondary | ICD-10-CM | POA: Diagnosis present

## 2020-04-13 DIAGNOSIS — G8929 Other chronic pain: Secondary | ICD-10-CM | POA: Diagnosis present

## 2020-04-13 DIAGNOSIS — Z79899 Other long term (current) drug therapy: Secondary | ICD-10-CM | POA: Diagnosis not present

## 2020-04-13 DIAGNOSIS — Z87891 Personal history of nicotine dependence: Secondary | ICD-10-CM | POA: Diagnosis not present

## 2020-04-13 DIAGNOSIS — R7401 Elevation of levels of liver transaminase levels: Secondary | ICD-10-CM | POA: Diagnosis present

## 2020-04-13 DIAGNOSIS — I352 Nonrheumatic aortic (valve) stenosis with insufficiency: Secondary | ICD-10-CM | POA: Diagnosis present

## 2020-04-13 DIAGNOSIS — I4891 Unspecified atrial fibrillation: Secondary | ICD-10-CM | POA: Diagnosis not present

## 2020-04-13 DIAGNOSIS — R778 Other specified abnormalities of plasma proteins: Secondary | ICD-10-CM | POA: Diagnosis not present

## 2020-04-13 DIAGNOSIS — J439 Emphysema, unspecified: Secondary | ICD-10-CM | POA: Diagnosis not present

## 2020-04-13 DIAGNOSIS — D649 Anemia, unspecified: Secondary | ICD-10-CM | POA: Diagnosis present

## 2020-04-13 DIAGNOSIS — R0689 Other abnormalities of breathing: Secondary | ICD-10-CM | POA: Diagnosis not present

## 2020-04-13 DIAGNOSIS — I447 Left bundle-branch block, unspecified: Secondary | ICD-10-CM | POA: Diagnosis present

## 2020-04-13 DIAGNOSIS — R7989 Other specified abnormal findings of blood chemistry: Secondary | ICD-10-CM | POA: Diagnosis not present

## 2020-04-13 DIAGNOSIS — R61 Generalized hyperhidrosis: Secondary | ICD-10-CM | POA: Diagnosis not present

## 2020-04-13 DIAGNOSIS — I350 Nonrheumatic aortic (valve) stenosis: Secondary | ICD-10-CM | POA: Diagnosis not present

## 2020-04-13 DIAGNOSIS — E876 Hypokalemia: Secondary | ICD-10-CM | POA: Diagnosis present

## 2020-04-13 DIAGNOSIS — I499 Cardiac arrhythmia, unspecified: Secondary | ICD-10-CM | POA: Diagnosis not present

## 2020-04-13 DIAGNOSIS — M5489 Other dorsalgia: Secondary | ICD-10-CM | POA: Diagnosis not present

## 2020-04-13 DIAGNOSIS — R531 Weakness: Secondary | ICD-10-CM | POA: Diagnosis not present

## 2020-04-13 DIAGNOSIS — N390 Urinary tract infection, site not specified: Secondary | ICD-10-CM | POA: Diagnosis not present

## 2020-04-13 DIAGNOSIS — K8020 Calculus of gallbladder without cholecystitis without obstruction: Secondary | ICD-10-CM | POA: Diagnosis not present

## 2020-04-13 DIAGNOSIS — R9431 Abnormal electrocardiogram [ECG] [EKG]: Secondary | ICD-10-CM | POA: Diagnosis not present

## 2020-04-13 DIAGNOSIS — I5030 Unspecified diastolic (congestive) heart failure: Secondary | ICD-10-CM

## 2020-04-13 LAB — CBC WITH DIFFERENTIAL/PLATELET
Abs Immature Granulocytes: 0.2 10*3/uL — ABNORMAL HIGH (ref 0.00–0.07)
Basophils Absolute: 0.1 10*3/uL (ref 0.0–0.1)
Basophils Relative: 0 %
Eosinophils Absolute: 0 10*3/uL (ref 0.0–0.5)
Eosinophils Relative: 0 %
HCT: 41.2 % (ref 36.0–46.0)
Hemoglobin: 13.9 g/dL (ref 12.0–15.0)
Immature Granulocytes: 1 %
Lymphocytes Relative: 6 %
Lymphs Abs: 1.4 10*3/uL (ref 0.7–4.0)
MCH: 30.3 pg (ref 26.0–34.0)
MCHC: 33.7 g/dL (ref 30.0–36.0)
MCV: 90 fL (ref 80.0–100.0)
Monocytes Absolute: 2.3 10*3/uL — ABNORMAL HIGH (ref 0.1–1.0)
Monocytes Relative: 11 %
Neutro Abs: 17.6 10*3/uL — ABNORMAL HIGH (ref 1.7–7.7)
Neutrophils Relative %: 82 %
Platelets: 284 10*3/uL (ref 150–400)
RBC: 4.58 MIL/uL (ref 3.87–5.11)
RDW: 14.1 % (ref 11.5–15.5)
WBC: 21.5 10*3/uL — ABNORMAL HIGH (ref 4.0–10.5)
nRBC: 0.1 % (ref 0.0–0.2)

## 2020-04-13 LAB — COMPREHENSIVE METABOLIC PANEL
ALT: 288 U/L — ABNORMAL HIGH (ref 0–44)
AST: 181 U/L — ABNORMAL HIGH (ref 15–41)
Albumin: 3.1 g/dL — ABNORMAL LOW (ref 3.5–5.0)
Alkaline Phosphatase: 88 U/L (ref 38–126)
Anion gap: 10 (ref 5–15)
BUN: 35 mg/dL — ABNORMAL HIGH (ref 8–23)
CO2: 20 mmol/L — ABNORMAL LOW (ref 22–32)
Calcium: 8.5 mg/dL — ABNORMAL LOW (ref 8.9–10.3)
Chloride: 105 mmol/L (ref 98–111)
Creatinine, Ser: 0.96 mg/dL (ref 0.44–1.00)
GFR calc Af Amer: >60 mL/min (ref >60)
GFR calc non Af Amer: >60 mL/min (ref >60)
Glucose, Bld: 118 mg/dL — ABNORMAL HIGH (ref 70–99)
Potassium: 2.7 mmol/L — CL (ref 3.5–5.1)
Sodium: 135 mmol/L (ref 135–145)
Total Bilirubin: 0.8 mg/dL (ref 0.3–1.2)
Total Protein: 6.8 g/dL (ref 6.5–8.1)

## 2020-04-13 LAB — SARS CORONAVIRUS 2 BY RT PCR (HOSPITAL ORDER, PERFORMED IN ~~LOC~~ HOSPITAL LAB): SARS Coronavirus 2: NEGATIVE

## 2020-04-13 LAB — URINALYSIS, ROUTINE W REFLEX MICROSCOPIC
Bilirubin Urine: NEGATIVE
Glucose, UA: NEGATIVE mg/dL
Ketones, ur: NEGATIVE mg/dL
Nitrite: POSITIVE — AB
Protein, ur: NEGATIVE mg/dL
Specific Gravity, Urine: 1.014 (ref 1.005–1.030)
pH: 6 (ref 5.0–8.0)

## 2020-04-13 LAB — TSH: TSH: 0.225 u[IU]/mL — ABNORMAL LOW (ref 0.350–4.500)

## 2020-04-13 LAB — TROPONIN I (HIGH SENSITIVITY)
Troponin I (High Sensitivity): 908 ng/L (ref <18)
Troponin I (High Sensitivity): 718 ng/L (ref <18)
Troponin I (High Sensitivity): 616 ng/L (ref <18)

## 2020-04-13 LAB — LIPASE, BLOOD: Lipase: 20 U/L (ref 11–51)

## 2020-04-13 LAB — LACTIC ACID, PLASMA: Lactic Acid, Venous: 1.4 mmol/L (ref 0.5–1.9)

## 2020-04-13 LAB — HEPARIN LEVEL (UNFRACTIONATED): Heparin Unfractionated: 0.82 IU/mL — ABNORMAL HIGH (ref 0.30–0.70)

## 2020-04-13 LAB — MAGNESIUM: Magnesium: 2.1 mg/dL (ref 1.7–2.4)

## 2020-04-13 MED ORDER — ALPRAZOLAM 0.5 MG PO TABS
0.5000 mg | ORAL_TABLET | Freq: Three times a day (TID) | ORAL | Status: DC | PRN
  Administered 2020-04-13 – 2020-04-14 (×4): 0.5 mg via ORAL
  Filled 2020-04-13 (×4): qty 1

## 2020-04-13 MED ORDER — BENZTROPINE MESYLATE 1 MG PO TABS
1.0000 mg | ORAL_TABLET | Freq: Two times a day (BID) | ORAL | Status: DC
  Administered 2020-04-13 – 2020-04-15 (×4): 1 mg via ORAL
  Filled 2020-04-13 (×4): qty 1

## 2020-04-13 MED ORDER — HEPARIN (PORCINE) 25000 UT/250ML-% IV SOLN
1100.0000 [IU]/h | INTRAVENOUS | Status: DC
  Administered 2020-04-13: 1000 [IU]/h via INTRAVENOUS
  Filled 2020-04-13: qty 250

## 2020-04-13 MED ORDER — ACETAMINOPHEN 650 MG RE SUPP: 650.0000 mg | Freq: Four times a day (QID) | RECTAL | Status: DC | PRN

## 2020-04-13 MED ORDER — ONDANSETRON HCL 4 MG PO TABS: 4.0000 mg | ORAL_TABLET | Freq: Three times a day (TID) | ORAL | Status: DC | PRN

## 2020-04-13 MED ORDER — POTASSIUM CHLORIDE 10 MEQ/100ML IV SOLN
10.0000 meq | INTRAVENOUS | Status: AC
  Administered 2020-04-13 (×2): 10 meq via INTRAVENOUS
  Filled 2020-04-13 (×2): qty 100

## 2020-04-13 MED ORDER — DILTIAZEM LOAD VIA INFUSION
10.0000 mg | Freq: Once | INTRAVENOUS | Status: AC
  Administered 2020-04-13: 10 mg via INTRAVENOUS
  Filled 2020-04-13: qty 10

## 2020-04-13 MED ORDER — POLYETHYLENE GLYCOL 3350 17 G PO PACK: 17.0000 g | PACK | Freq: Every day | ORAL | Status: DC | PRN

## 2020-04-13 MED ORDER — POTASSIUM CHLORIDE CRYS ER 20 MEQ PO TBCR
40.0000 meq | EXTENDED_RELEASE_TABLET | Freq: Once | ORAL | Status: AC
  Administered 2020-04-13: 40 meq via ORAL
  Filled 2020-04-13: qty 2

## 2020-04-13 MED ORDER — FUROSEMIDE 40 MG PO TABS
40.0000 mg | ORAL_TABLET | Freq: Every day | ORAL | Status: DC
  Administered 2020-04-13 – 2020-04-15 (×3): 40 mg via ORAL
  Filled 2020-04-13 (×3): qty 1

## 2020-04-13 MED ORDER — HEPARIN BOLUS VIA INFUSION
4000.0000 [IU] | Freq: Once | INTRAVENOUS | Status: AC
  Administered 2020-04-13: 4000 [IU] via INTRAVENOUS

## 2020-04-13 MED ORDER — ALBUTEROL SULFATE (2.5 MG/3ML) 0.083% IN NEBU: 2.5000 mg | INHALATION_SOLUTION | Freq: Four times a day (QID) | RESPIRATORY_TRACT | Status: DC

## 2020-04-13 MED ORDER — DONEPEZIL HCL 5 MG PO TABS
10.0000 mg | ORAL_TABLET | Freq: Every day | ORAL | Status: DC
  Administered 2020-04-13 – 2020-04-14 (×2): 10 mg via ORAL
  Filled 2020-04-13 (×3): qty 2

## 2020-04-13 MED ORDER — FLUTICASONE FUROATE-VILANTEROL 200-25 MCG/INH IN AEPB
1.0000 | INHALATION_SPRAY | Freq: Every day | RESPIRATORY_TRACT | Status: DC
  Administered 2020-04-13 – 2020-04-15 (×3): 1 via RESPIRATORY_TRACT
  Filled 2020-04-13: qty 28

## 2020-04-13 MED ORDER — POTASSIUM CHLORIDE ER 10 MEQ PO TBCR
10.0000 meq | EXTENDED_RELEASE_TABLET | Freq: Every day | ORAL | Status: DC
  Filled 2020-04-13 (×2): qty 1

## 2020-04-13 MED ORDER — SODIUM CHLORIDE 0.9 % IV BOLUS
1000.0000 mL | Freq: Once | INTRAVENOUS | Status: AC
  Administered 2020-04-13: 1000 mL via INTRAVENOUS

## 2020-04-13 MED ORDER — FLUTICASONE PROPIONATE 50 MCG/ACT NA SUSP
1.0000 | Freq: Every day | NASAL | Status: DC
  Administered 2020-04-14 – 2020-04-15 (×2): 1 via NASAL
  Filled 2020-04-13 (×2): qty 16

## 2020-04-13 MED ORDER — ISOSORBIDE MONONITRATE ER 30 MG PO TB24
30.0000 mg | ORAL_TABLET | Freq: Every day | ORAL | Status: DC
  Administered 2020-04-13 – 2020-04-15 (×3): 30 mg via ORAL
  Filled 2020-04-13 (×3): qty 1

## 2020-04-13 MED ORDER — SODIUM CHLORIDE 0.9% FLUSH: 3.0000 mL | INTRAVENOUS | Status: DC | PRN

## 2020-04-13 MED ORDER — DILTIAZEM HCL-DEXTROSE 125-5 MG/125ML-% IV SOLN (PREMIX)
5.0000 mg/h | INTRAVENOUS | Status: DC
  Administered 2020-04-13: 5 mg/h via INTRAVENOUS
  Administered 2020-04-13: 10 mg/h via INTRAVENOUS
  Administered 2020-04-13: 15 mg/h via INTRAVENOUS
  Filled 2020-04-13 (×2): qty 125

## 2020-04-13 MED ORDER — OLOPATADINE HCL 0.1 % OP SOLN
1.0000 [drp] | Freq: Two times a day (BID) | OPHTHALMIC | Status: DC
  Administered 2020-04-14 – 2020-04-15 (×2): 1 [drp] via OPHTHALMIC
  Filled 2020-04-13: qty 5

## 2020-04-13 MED ORDER — SODIUM CHLORIDE 0.9 % IV SOLN: 250.0000 mL | INTRAVENOUS | Status: DC | PRN

## 2020-04-13 MED ORDER — VENLAFAXINE HCL ER 75 MG PO CP24
300.0000 mg | ORAL_CAPSULE | Freq: Every day | ORAL | Status: DC
  Administered 2020-04-13 – 2020-04-15 (×3): 300 mg via ORAL
  Filled 2020-04-13 (×3): qty 4

## 2020-04-13 MED ORDER — POTASSIUM CHLORIDE 10 MEQ/100ML IV SOLN: 10.0000 meq | INTRAVENOUS | Status: DC

## 2020-04-13 MED ORDER — OXYCODONE HCL 5 MG PO TABS
10.0000 mg | ORAL_TABLET | Freq: Three times a day (TID) | ORAL | Status: DC | PRN
  Administered 2020-04-13 – 2020-04-15 (×4): 10 mg via ORAL
  Filled 2020-04-13 (×4): qty 2

## 2020-04-13 MED ORDER — GERHARDT'S BUTT CREAM
TOPICAL_CREAM | Freq: Four times a day (QID) | CUTANEOUS | Status: DC | PRN
  Filled 2020-04-13: qty 1

## 2020-04-13 MED ORDER — ACETAMINOPHEN 325 MG PO TABS: 650.0000 mg | ORAL_TABLET | Freq: Four times a day (QID) | ORAL | Status: DC | PRN

## 2020-04-13 MED ORDER — TIZANIDINE HCL 2 MG PO TABS
2.0000 mg | ORAL_TABLET | Freq: Two times a day (BID) | ORAL | Status: DC
  Administered 2020-04-13 – 2020-04-15 (×4): 2 mg via ORAL
  Filled 2020-04-13 (×4): qty 1

## 2020-04-13 MED ORDER — MENTHOL-ZINC OXIDE 0.44-20.625 % EX OINT: 1.0000 "application " | TOPICAL_OINTMENT | Freq: Four times a day (QID) | CUTANEOUS | Status: DC | PRN

## 2020-04-13 MED ORDER — ENOXAPARIN SODIUM 40 MG/0.4ML ~~LOC~~ SOLN: 40.0000 mg | SUBCUTANEOUS | Status: DC

## 2020-04-13 MED ORDER — SODIUM CHLORIDE 0.9 % IV SOLN
1.0000 g | INTRAVENOUS | Status: DC
  Administered 2020-04-14: 1 g via INTRAVENOUS
  Filled 2020-04-13: qty 10

## 2020-04-13 MED ORDER — SODIUM CHLORIDE 0.9 % IV SOLN
1.0000 g | Freq: Once | INTRAVENOUS | Status: AC
  Administered 2020-04-13: 1 g via INTRAVENOUS
  Filled 2020-04-13: qty 10

## 2020-04-13 MED ORDER — ALBUTEROL SULFATE HFA 108 (90 BASE) MCG/ACT IN AERS: 2.0000 | INHALATION_SPRAY | Freq: Four times a day (QID) | RESPIRATORY_TRACT | Status: DC

## 2020-04-13 MED ORDER — LURASIDONE HCL 40 MG PO TABS
80.0000 mg | ORAL_TABLET | Freq: Every day | ORAL | Status: DC
  Administered 2020-04-14: 80 mg via ORAL
  Filled 2020-04-13 (×2): qty 2
  Filled 2020-04-13: qty 1

## 2020-04-13 MED ORDER — BUPROPION HCL ER (XL) 150 MG PO TB24
150.0000 mg | ORAL_TABLET | Freq: Every morning | ORAL | Status: DC
  Administered 2020-04-14 – 2020-04-15 (×2): 150 mg via ORAL
  Filled 2020-04-13 (×2): qty 1

## 2020-04-13 MED ORDER — PANTOPRAZOLE SODIUM 40 MG PO TBEC
40.0000 mg | DELAYED_RELEASE_TABLET | Freq: Two times a day (BID) | ORAL | Status: DC
  Administered 2020-04-13 – 2020-04-15 (×4): 40 mg via ORAL
  Filled 2020-04-13 (×4): qty 1

## 2020-04-13 MED ORDER — GABAPENTIN 300 MG PO CAPS
300.0000 mg | ORAL_CAPSULE | Freq: Three times a day (TID) | ORAL | Status: DC
  Administered 2020-04-13 – 2020-04-15 (×5): 300 mg via ORAL
  Filled 2020-04-13 (×5): qty 1

## 2020-04-13 MED ORDER — METOPROLOL SUCCINATE ER 50 MG PO TB24
50.0000 mg | ORAL_TABLET | Freq: Every day | ORAL | Status: DC
  Administered 2020-04-13 – 2020-04-15 (×3): 50 mg via ORAL
  Filled 2020-04-13 (×3): qty 1

## 2020-04-13 MED ORDER — MEMANTINE HCL 10 MG PO TABS
10.0000 mg | ORAL_TABLET | Freq: Two times a day (BID) | ORAL | Status: DC
  Administered 2020-04-13 – 2020-04-15 (×4): 10 mg via ORAL
  Filled 2020-04-13 (×4): qty 1

## 2020-04-13 MED ORDER — SODIUM CHLORIDE 0.9% FLUSH
3.0000 mL | Freq: Two times a day (BID) | INTRAVENOUS | Status: DC
  Administered 2020-04-13 – 2020-04-15 (×4): 3 mL via INTRAVENOUS

## 2020-04-13 MED ORDER — TRAZODONE HCL 50 MG PO TABS
100.0000 mg | ORAL_TABLET | Freq: Every day | ORAL | Status: DC
  Administered 2020-04-13 – 2020-04-14 (×2): 100 mg via ORAL
  Filled 2020-04-13 (×2): qty 2

## 2020-04-13 MED ORDER — BISACODYL 10 MG RE SUPP: 10.0000 mg | Freq: Every day | RECTAL | Status: DC | PRN

## 2020-04-13 NOTE — ED Notes (Signed)
purewik was placed on pt.

## 2020-04-13 NOTE — ED Provider Notes (Signed)
[facility] EMERGENCY DEPARTMENT Provider Note   CSN: [id number] Arrival date & time: 04/13/20  1224     History Chief Complaint  Patient presents with  . Back Pain    Kathleen Valenzuela is a 69 y.o. female.  HPI      69 year old female, with a PMH of COPD, HTN, HLD, bipolar, heart failure, presents with complaints of back pain.  Patient is alert and oriented from SNF.  She states she is having mid back pain.  She was noted to be A. fib on monitor.  She is diaphoretic.  When asked how long she has been sweating she states for 3 days.  She denies any chest pain or shortness of breath.  She denies any fevers, chills.  Patient denies any nausea or vomiting however she is holding an emesis bag and actively gagging.  She denies any abdominal pain.   Past Medical History:  Diagnosis Date  . Anemia   . Anxiety   . Aortic atherosclerosis ([place]) 04/21/2017  . Arthritis   . Bipolar 1 disorder ([place])   . Chronic back pain   . COPD (chronic obstructive pulmonary disease) ([place])   . Diastolic dysfunction with heart failure ([place]) 04/20/2017  . Duodenal stricture   . Early onset Alzheimer's dementia ([place])   . GERD (gastroesophageal reflux disease)   . Heart murmur   . High cholesterol   . Hypertension   . Shortness of breath dyspnea     Patient Active Problem List   Diagnosis Date Noted  . Obesity (BMI 30-39.9) 04/21/2017  . Aortic atherosclerosis ([place]) 04/21/2017  . Acute on chronic diastolic CHF (congestive heart failure) ([place]) 04/21/2017  . Acute respiratory failure with hypoxia ([place]) 04/19/2017  . Pulmonary edema 04/19/2017  . MRSA bacteremia 04/14/2017  . Essential hypertension 04/13/2017  . Vaginal candidiasis 04/12/2017  . Diverticulitis of colon 04/12/2017  . COPD (chronic obstructive pulmonary disease) ([place]) 04/11/2017  . Diverticulitis 04/11/2017  . Colonic diverticular abscess   . Gastric ulcer 01/10/2017  . Esophageal dysphagia 12/01/2016  . Bipolar disorder ([place])  08/03/2015  . High cholesterol 08/03/2015  . GERD (gastroesophageal reflux disease) 08/03/2015  . Duodenal stricture 08/03/2015    Past Surgical History:  Procedure Laterality Date  . BIOPSY  01/06/2017   Procedure: BIOPSY;  Surgeon: Rogene Houston, MD;  [facility];  Service: Endoscopy;;  esophageal biopsy  . CESAREAN SECTION    . COLONOSCOPY    . ESOPHAGEAL DILATION N/A 10/29/2015   Procedure: ESOPHAGEAL DILATION;  Surgeon: Rogene Houston, MD;  [facility];  Service: Endoscopy;  Laterality: N/A;  . ESOPHAGEAL DILATION N/A 01/29/2016   Procedure: ESOPHAGEAL DILATION;  Surgeon: Rogene Houston, MD;  [facility];  Service: Endoscopy;  Laterality: N/A;  . ESOPHAGEAL DILATION N/A 01/06/2017   Procedure: ESOPHAGEAL DILATION;  Surgeon: Rogene Houston, MD;  [facility];  Service: Endoscopy;  Laterality: N/A;  . ESOPHAGOGASTRODUODENOSCOPY N/A 10/29/2015   Procedure: ESOPHAGOGASTRODUODENOSCOPY (EGD);  Surgeon: Rogene Houston, MD;  [facility];  Service: Endoscopy;  Laterality: N/A;  1200  . ESOPHAGOGASTRODUODENOSCOPY (EGD) WITH PROPOFOL N/A 01/29/2016   Procedure: ESOPHAGOGASTRODUODENOSCOPY (EGD) WITH PROPOFOL;  Surgeon: Rogene Houston, MD;  [facility];  Service: Endoscopy;  Laterality: N/A;  7:30 - moved to 4/21 @11 : 25 - Ann notified pt to arrive at 10:00  . ESOPHAGOGASTRODUODENOSCOPY (EGD) WITH PROPOFOL N/A 01/06/2017   Procedure: ESOPHAGOGASTRODUODENOSCOPY (EGD) WITH PROPOFOL;  Surgeon: Laural Golden,  Mechele Dawley, MD;  [facility];  Service: Endoscopy;  Laterality: N/A;  11:20  . ESOPHAGOGASTRODUODENOSCOPY (EGD) WITH PROPOFOL N/A 02/17/2017   Procedure: ESOPHAGOGASTRODUODENOSCOPY (EGD) WITH PROPOFOL;  Surgeon: Rogene Houston, MD;  [facility];  Service: Endoscopy;  Laterality: N/A;  10:30  . FOOT SURGERY Right    bunionectomy  . HEMORRHOID SURGERY       OB History   No obstetric history on file.      History reviewed. No pertinent family history.  Social History   Tobacco Use  . Smoking status: Former Smoker    Packs/day: 2.00    Years: 35.00    Pack years: 70.00    Types: Cigarettes    Quit date: 07/25/2015    Years since quitting: 4.7  . Smokeless tobacco: Never Used  . Tobacco comment: quit 2 weeks ago (November 2016)  Vaping Use  . Vaping Use: Never used  Substance Use Topics  . Alcohol use: No    Alcohol/week: 0.0 standard drinks  . Drug use: No    Home Medications Prior to Admission medications   Medication Sig Start Date End Date Taking? Authorizing Provider  albuterol (PROVENTIL HFA;VENTOLIN HFA) 108 (90 BASE) MCG/ACT inhaler Inhale 2 puffs into the lungs 4 (four) times daily. (0800, 1200, 1600, & 2000)    [provider]  ALPRAZolam (XANAX) 1 MG tablet Take 0.5 tablets (0.5 mg total) by mouth 3 (three) times daily as needed for anxiety. (0800, 1400, & 2000) 04/24/17   Rosita Fire, MD  atorvastatin (LIPITOR) 10 MG tablet Take 10 mg by mouth daily at 8 pm.     [provider]  calcium carbonate (TUMS - DOSED IN MG ELEMENTAL CALCIUM) 500 MG chewable tablet Chew 2 tablets by mouth every 2 (two) hours as needed for indigestion (CHEW BEFORE SWALLOWING).    [provider]  ciprofloxacin (CIPRO) 250 MG tablet Take 1 tablet (250 mg total) by mouth 2 (two) times daily. One po bid x 7 days 12/09/19   Milton Ferguson, MD  dicyclomine (BENTYL) 20 MG tablet Take 20 mg by mouth 4 (four) times daily. (0800, 1200, 1600, & 2000)    [provider]  docusate sodium (COLACE) 100 MG capsule Take 100 mg by mouth 2 (two) times daily. (0800 & 2000)    [provider]  donepezil (ARICEPT) 10 MG tablet Take 10 mg by mouth daily at 8 pm.    [provider]  Ferrous Gluconate 324 (37.5 Fe) MG TABS Take 324 mg by mouth 2 (two) times daily. (0800 & 2000)    [provider]  fluticasone (FLONASE) 50 MCG/ACT nasal spray Place  1 spray into both nostrils daily. (0800)    [provider]  Fluticasone Furoate-Vilanterol (BREO ELLIPTA) 200-25 MCG/INH AEPB Inhale 1 puff into the lungs daily. (0800)    [provider]  furosemide (LASIX) 40 MG tablet Take 40 mg by mouth daily. (0800)    [provider]  gabapentin (NEURONTIN) 300 MG capsule Take 300 mg by mouth 3 (three) times daily. 11/14/19   [provider]  isosorbide mononitrate (IMDUR) 30 MG 24 hr tablet Take 30 mg by mouth daily. (0800)    [provider]  LATUDA 80 MG TABS tablet Take 80 mg by mouth at bedtime. 11/26/19   [provider]  lithium 300 MG tablet Take 300 mg by mouth 2 (two) times daily. (0800 & 2000)    [provider]  lithium carbonate 150 MG capsule Take 150 mg by mouth every morning. Take 150 mg capsule along with 300 mg tablet to equal 450 mg.    [provider]  Menthol-Zinc Oxide (RISAMINE) 0.44-20.625 % OINT Apply 1 application topically 4 (four) times daily as needed (for rash/redness).    [provider]  metoprolol succinate (TOPROL-XL) 50 MG 24 hr tablet Take 50 mg by mouth daily. (0800)Take with or immediately following a meal.    [provider]  Olopatadine HCl (PATADAY) 0.2 % SOLN Place 1 drop into both eyes daily. (0800)    [provider]  Oxycodone HCl 10 MG TABS Take 1 tablet (10 mg total) by mouth every 8 (eight) hours as needed (FOR Pain). 04/24/17   Rosita Fire, MD  pantoprazole (PROTONIX) 40 MG tablet Take 1 tablet (40 mg total) by mouth 2 (two) times daily before a meal. 02/17/17   Rehman, Mechele Dawley, MD  potassium chloride (K-DUR,KLOR-CON) 10 MEQ tablet Take 2 tablets (20 mEq total) by mouth daily. (0800) 04/24/17   Rosita Fire, MD  tiZANidine (ZANAFLEX) 2 MG tablet Take 2 mg by mouth 2 (two) times daily. (0800 & 2000) 12/01/16   [provider]  traZODone (DESYREL) 50 MG tablet Take 50 mg by mouth daily at 8 pm.      [provider]  VOLTAREN 1 % GEL Apply 4 g topically every 12 (twelve) hours as needed. For knee pain 11/17/16   [provider]    Allergies    Penicillins  Review of Systems   Review of Systems  Constitutional: Negative for chills and fever.  Respiratory: Negative for shortness of breath.   Cardiovascular: Negative for chest pain.  Gastrointestinal: Positive for nausea and vomiting. Negative for abdominal pain.  Musculoskeletal: Positive for back pain.  All other systems reviewed and are negative.   Physical Exam Updated Vital Signs BP (!) 155/76   Pulse (!) 52   Temp 99.3 F (37.4 C) (Rectal)   Resp 18   Ht 5\' 4"  (1.626 m)   Wt 79.8 kg   SpO2 94%   BMI 30.21 kg/m   Physical Exam Vitals and nursing note reviewed.  Constitutional:      Appearance: She is well-developed.  HENT:     Head: Normocephalic and atraumatic.  Eyes:     Conjunctiva/sclera: Conjunctivae normal.  Cardiovascular:     Rate and Rhythm: Bradycardia present. Rhythm irregular.     Heart sounds: Normal heart sounds.  Pulmonary:     Effort: Pulmonary effort is normal. No respiratory distress.     Breath sounds: Normal breath sounds. No wheezing or rales.  Abdominal:     General: Bowel sounds are normal. There is no distension.     Palpations: Abdomen is soft.     Tenderness: There is no abdominal tenderness.  Musculoskeletal:        General: No tenderness or deformity. Normal range of motion.     Cervical back: Neck supple.  Skin:    General: Skin is warm and dry.     Findings: No erythema or rash.  Neurological:     Mental Status: She is alert and oriented to person, place, and time.  Psychiatric:        Behavior: Behavior normal.     ED Results / Procedures / Treatments   Labs (all labs ordered are listed, but only abnormal results are displayed) Labs Reviewed  COMPREHENSIVE METABOLIC PANEL  CBC WITH DIFFERENTIAL/PLATELET  URINALYSIS, ROUTINE W REFLEX MICROSCOPIC   TROPONIN I (HIGH SENSITIVITY)    EKG EKG Interpretation  Date/Time:  Monday April 13 2020 12:46:36 EDT Ventricular Rate:  133 PR Interval:    QRS Duration: 140 QT Interval:  327 QTC Calculation: 487 R Axis:   -20 Text Interpretation: Atrial flutter Ventricular premature complex Left bundle branch block No STEMI Confirmed by Nanda Quinton [phone]) on 04/13/2020 1:01:23 PM   Radiology No results found.  Procedures .Critical Care Performed by: Etter Sjogren, PA-C Authorized by: Etter Sjogren, PA-C   Critical care provider statement:    Critical care time (minutes):  45   Critical care time was exclusive of:  Separately billable procedures and treating other patients   Critical care was necessary to treat or prevent imminent or life-threatening deterioration of the following conditions:  Cardiac failure and sepsis   Critical care was time spent personally by me on the following activities:  Discussions with consultants, evaluation of patient's response to treatment, examination of patient, ordering and performing treatments and interventions, ordering and review of laboratory studies, ordering and review of radiographic studies, pulse oximetry, re-evaluation of patient's condition, obtaining history from patient or surrogate and review of old charts   (including critical care time)  Medications Ordered in ED Medications  diltiazem (CARDIZEM) 1 mg/mL load via infusion 10 mg (has no administration in time range)    And  diltiazem (CARDIZEM) 125 mg in dextrose 5% 125 mL (1 mg/mL) infusion (has no administration in time range)    ED Course  I have reviewed the triage vital signs and the nursing notes.  Pertinent labs & imaging results that were available during my care of the patient were reviewed by me and considered in my medical decision making (see chart for details).    MDM Rules/Calculators/A&P                          Patient presents with tachycardia,  diaphoretic.  She has only complaining of back pain.  EKG shows a flutter.  Patient given Cardizem bolus and started on a drip.  Rectal temp 99.3.  White count elevated at 21.5.  Of blood cultures and lactic ordered.  Patient started on broad-spectrum antibiotics of ceftriaxone, given 1 L fluids.  She is euvolemic and has normal blood pressure.  Patient with a history of CHF so not given a full 30 cc/kg bolus of fluids.  CMP shows hypokalemia, elevated LFTs.  Urine suggestive of UTI.  1522: Called and discussed case with cardiology on-call, Dr. Harrington Challenger.  Agreeable with starting anticoagulation of heparin.  Feels that elevated troponin likely due to demand ischemia.  Recommends admission to medicine, rate control, echo.     1551: Discussed with hospitalist, Dr. Esmond Plants, who will admit the patient.  Patient agreeable with plan.  Final Clinical Impression(s) / ED Diagnoses Final diagnoses:  None    Rx / DC Orders ED Discharge Orders    None       Rachel Moulds 04/13/20 2026    Margette Fast, MD 04/15/20 1942

## 2020-04-13 NOTE — ED Notes (Signed)
Pt had small watery bm, cleaned pt and changed depends brief.  Pt then had another small watery bm, cleaned pt and changed depends.

## 2020-04-13 NOTE — ED Notes (Signed)
Pt had small liquid bm, cleaned pt and changed diaper, replaced pure wick.

## 2020-04-13 NOTE — Progress Notes (Signed)
ANTICOAGULATION CONSULT NOTE - Initial Consult  Pharmacy Consult for heparin IV Indication: atrial fibrillation  Allergies  Allergen Reactions  . Penicillins Rash    Has patient had a PCN reaction causing immediate rash, facial/tongue/throat swelling, SOB or lightheadedness with hypotension: Yes Has patient had a PCN reaction causing severe rash involving mucus membranes or skin necrosis: No Has patient had a PCN reaction that required hospitalization: No Has patient had a PCN reaction occurring within the last 10 years: No If all of the above answers are "NO", then may proceed with Cephalosporin use.     Patient Measurements: Height: 5\' 4"  (162.6 cm) Weight: 79.8 kg (176 lb) IBW/kg (Calculated) : 54.7 Heparin Dosing Weight: 72 kg  Vital Signs: Temp: 99.3 F (37.4 C) (08/09 1313) Temp Source: Rectal (08/09 1313) BP: 169/122 (08/09 1442) Pulse Rate: 76 (08/09 1450)  Labs: Recent Labs    04/13/20 1314  HGB 13.9  HCT 41.2  PLT 284  CREATININE 0.96  TROPONINIHS 908*    Estimated Creatinine Clearance: 57.3 mL/min (by C-G formula based on SCr of 0.96 mg/dL).   Medical History: Past Medical History:  Diagnosis Date  . Anemia   . Anxiety   . Aortic atherosclerosis ([place]) 04/21/2017  . Arthritis   . Bipolar 1 disorder ([place])   . Chronic back pain   . COPD (chronic obstructive pulmonary disease) ([place])   . Diastolic dysfunction with heart failure ([place]) 04/20/2017  . Duodenal stricture   . Early onset Alzheimer's dementia ([place])   . GERD (gastroesophageal reflux disease)   . Heart murmur   . High cholesterol   . Hypertension   . Shortness of breath dyspnea     Medications:  (Not in a hospital admission)   Assessment: Pharmacy consulted to dose heparin in patient with atrial fibrillation.  Patient is not on anticoagulation prior to admission.    Goal of Therapy:  Heparin level 0.3-0.7 units/ml Monitor platelets by anticoagulation protocol: Yes   Plan:  Give  4000 units bolus x 1 Start heparin infusion at 1000 units/hr Check anti-Xa level in 6-8 hours and daily while on heparin Continue to monitor H&H and platelets  Ramond Craver 04/13/2020,3:39 PM
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
## 2020-04-13 NOTE — ED Notes (Signed)
Second rn at bedside to assess iv site from R ac, states no infiltration felt or noted.

## 2020-04-13 NOTE — ED Triage Notes (Signed)
Pt from [facility]. C/o of back pain, weakness and diaphoretic. afib on monitor

## 2020-04-13 NOTE — ED Notes (Signed)
Date and time results received: 04/13/20 *1557** (use smartphrase ".now" to insert current time)  Test: *Trop* Critical Value: **718*  Name of Provider Notified: Candace Cruise and PA Kendrick**

## 2020-04-13 NOTE — ED Notes (Signed)
Potassium infusion complete, pt reports some pain in her iv site, iv cath appears to be kinked, attempted to reposition iv cath without success, d/c pt iv from R ac.  No infiltration noted, pt reports decreased pain at site.
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
## 2020-04-13 NOTE — ED Notes (Addendum)
CRITICAL VALUE ALERT  Critical Value:  Potassium 2.7, trop 908  Date & Time Notied:  04/13/20, 1402  Provider Notified: Caitlyn, PA  Orders Received/Actions taken: no new orders received

## 2020-04-14 ENCOUNTER — Inpatient Hospital Stay (HOSPITAL_COMMUNITY): Payer: Medicare Other

## 2020-04-14 ENCOUNTER — Encounter (HOSPITAL_COMMUNITY): Payer: Self-pay | Admitting: Internal Medicine

## 2020-04-14 DIAGNOSIS — I350 Nonrheumatic aortic (valve) stenosis: Secondary | ICD-10-CM

## 2020-04-14 DIAGNOSIS — F0281 Dementia in other diseases classified elsewhere with behavioral disturbance: Secondary | ICD-10-CM

## 2020-04-14 DIAGNOSIS — I351 Nonrheumatic aortic (valve) insufficiency: Secondary | ICD-10-CM

## 2020-04-14 DIAGNOSIS — J439 Emphysema, unspecified: Secondary | ICD-10-CM

## 2020-04-14 DIAGNOSIS — G300 Alzheimer's disease with early onset: Secondary | ICD-10-CM

## 2020-04-14 DIAGNOSIS — K219 Gastro-esophageal reflux disease without esophagitis: Secondary | ICD-10-CM

## 2020-04-14 LAB — ECHOCARDIOGRAM COMPLETE
AR max vel: 1.18 cm2
AV Area VTI: 1.23 cm2
AV Area mean vel: 1.2 cm2
AV Mean grad: 15.5 mmHg
AV Peak grad: 25.4 mmHg
Ao pk vel: 2.52 m/s
Area-P 1/2: 2.34 cm2
Height: 64 in
P 1/2 time: 439 msec
S' Lateral: 3.23 cm
Weight: 2539.7 oz

## 2020-04-14 LAB — CBC
HCT: 34.5 % — ABNORMAL LOW (ref 36.0–46.0)
Hemoglobin: 11.4 g/dL — ABNORMAL LOW (ref 12.0–15.0)
MCH: 30.8 pg (ref 26.0–34.0)
MCHC: 33 g/dL (ref 30.0–36.0)
MCV: 93.2 fL (ref 80.0–100.0)
Platelets: 231 10*3/uL (ref 150–400)
RBC: 3.7 MIL/uL — ABNORMAL LOW (ref 3.87–5.11)
RDW: 14.3 % (ref 11.5–15.5)
WBC: 18 10*3/uL — ABNORMAL HIGH (ref 4.0–10.5)
nRBC: 0.1 % (ref 0.0–0.2)

## 2020-04-14 LAB — TROPONIN I (HIGH SENSITIVITY)
Troponin I (High Sensitivity): 413 ng/L (ref <18)
Troponin I (High Sensitivity): 511 ng/L (ref <18)
Troponin I (High Sensitivity): 407 ng/L (ref <18)

## 2020-04-14 LAB — MAGNESIUM: Magnesium: 1.7 mg/dL (ref 1.7–2.4)

## 2020-04-14 LAB — POTASSIUM: Potassium: 2.9 mmol/L — ABNORMAL LOW (ref 3.5–5.1)

## 2020-04-14 LAB — HIV ANTIBODY (ROUTINE TESTING W REFLEX): HIV Screen 4th Generation wRfx: NONREACTIVE

## 2020-04-14 LAB — BASIC METABOLIC PANEL
Anion gap: 5 (ref 5–15)
BUN: 22 mg/dL (ref 8–23)
CO2: 20 mmol/L — ABNORMAL LOW (ref 22–32)
Calcium: 7.9 mg/dL — ABNORMAL LOW (ref 8.9–10.3)
Chloride: 113 mmol/L — ABNORMAL HIGH (ref 98–111)
Creatinine, Ser: 0.79 mg/dL (ref 0.44–1.00)
GFR calc Af Amer: >60 mL/min (ref >60)
GFR calc non Af Amer: >60 mL/min (ref >60)
Glucose, Bld: 114 mg/dL — ABNORMAL HIGH (ref 70–99)
Potassium: 3.2 mmol/L — ABNORMAL LOW (ref 3.5–5.1)
Sodium: 138 mmol/L (ref 135–145)

## 2020-04-14 LAB — MRSA PCR SCREENING: MRSA by PCR: NEGATIVE

## 2020-04-14 LAB — HEPARIN LEVEL (UNFRACTIONATED): Heparin Unfractionated: 0.25 IU/mL — ABNORMAL LOW (ref 0.30–0.70)

## 2020-04-14 MED ORDER — POTASSIUM CHLORIDE CRYS ER 10 MEQ PO TBCR: 30.0000 meq | EXTENDED_RELEASE_TABLET | Freq: Every day | ORAL | Status: DC

## 2020-04-14 MED ORDER — APIXABAN 5 MG PO TABS
5.0000 mg | ORAL_TABLET | Freq: Two times a day (BID) | ORAL | Status: DC
  Administered 2020-04-14 – 2020-04-15 (×3): 5 mg via ORAL
  Filled 2020-04-14 (×3): qty 1

## 2020-04-14 MED ORDER — POTASSIUM CHLORIDE CRYS ER 20 MEQ PO TBCR
40.0000 meq | EXTENDED_RELEASE_TABLET | Freq: Two times a day (BID) | ORAL | Status: AC
  Administered 2020-04-14 (×2): 40 meq via ORAL
  Filled 2020-04-14 (×2): qty 2

## 2020-04-14 MED ORDER — CHLORHEXIDINE GLUCONATE CLOTH 2 % EX PADS
6.0000 | MEDICATED_PAD | Freq: Every day | CUTANEOUS | Status: DC
  Administered 2020-04-14 – 2020-04-15 (×2): 6 via TOPICAL

## 2020-04-14 MED ORDER — MAGNESIUM SULFATE 2 GM/50ML IV SOLN
2.0000 g | Freq: Once | INTRAVENOUS | Status: AC
  Administered 2020-04-14: 2 g via INTRAVENOUS
  Filled 2020-04-14: qty 50
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
## 2020-04-14 NOTE — Progress Notes (Signed)
*  PRELIMINARY RESULTS* Echocardiogram 2D Echocardiogram has been performed.  Kathleen Valenzuela 04/14/2020, 2:49 PM

## 2020-04-14 NOTE — Progress Notes (Signed)
ANTICOAGULATION CONSULT NOTE - Follow Up Consult  Pharmacy Consult for heparin Indication: atrial fibrillation  Labs: Recent Labs    04/13/20 1314 04/13/20 1314 04/13/20 1451 04/13/20 1920 04/14/20 0019 04/14/20 0240  HGB 13.9  --   --   --   --  11.4*  HCT 41.2  --   --   --   --  34.5*  PLT 284  --   --   --   --  231  HEPARINUNFRC  --   --   --  0.82*  --  0.25*  CREATININE 0.96  --   --   --   --  0.79  TROPONINIHS 908*   < > 718* 616* 413*  --    < > = values in this interval not displayed.    Assessment: 69yo female subtherapeutic on heparin with initial dosing for new Afib.  Goal of Therapy:  Heparin level 0.3-0.7 units/ml   Plan:  Will increase heparin gtt by 1-2 units/kg/hr to 1100 units/hr and check level in 6 hours.    Wynona Neat, PharmD, BCPS  04/14/2020,4:02 AM
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
## 2020-04-15 DIAGNOSIS — I4892 Unspecified atrial flutter: Principal | ICD-10-CM

## 2020-04-15 DIAGNOSIS — R7989 Other specified abnormal findings of blood chemistry: Secondary | ICD-10-CM

## 2020-04-15 DIAGNOSIS — R778 Other specified abnormalities of plasma proteins: Secondary | ICD-10-CM

## 2020-04-15 DIAGNOSIS — R945 Abnormal results of liver function studies: Secondary | ICD-10-CM

## 2020-04-15 DIAGNOSIS — R319 Hematuria, unspecified: Secondary | ICD-10-CM

## 2020-04-15 DIAGNOSIS — I5032 Chronic diastolic (congestive) heart failure: Secondary | ICD-10-CM

## 2020-04-15 DIAGNOSIS — E876 Hypokalemia: Secondary | ICD-10-CM

## 2020-04-15 DIAGNOSIS — N390 Urinary tract infection, site not specified: Secondary | ICD-10-CM

## 2020-04-15 DIAGNOSIS — I10 Essential (primary) hypertension: Secondary | ICD-10-CM

## 2020-04-15 LAB — CBC
HCT: 36.4 % (ref 36.0–46.0)
Hemoglobin: 11.9 g/dL — ABNORMAL LOW (ref 12.0–15.0)
MCH: 30.4 pg (ref 26.0–34.0)
MCHC: 32.7 g/dL (ref 30.0–36.0)
MCV: 92.9 fL (ref 80.0–100.0)
Platelets: 220 10*3/uL (ref 150–400)
RBC: 3.92 MIL/uL (ref 3.87–5.11)
RDW: 14.6 % (ref 11.5–15.5)
WBC: 13.5 10*3/uL — ABNORMAL HIGH (ref 4.0–10.5)
nRBC: 0 % (ref 0.0–0.2)

## 2020-04-15 LAB — HEPATIC FUNCTION PANEL
ALT: 127 U/L — ABNORMAL HIGH (ref 0–44)
AST: 40 U/L (ref 15–41)
Albumin: 2.8 g/dL — ABNORMAL LOW (ref 3.5–5.0)
Alkaline Phosphatase: 74 U/L (ref 38–126)
Bilirubin, Direct: 0.1 mg/dL (ref 0.0–0.2)
Indirect Bilirubin: 0.3 mg/dL (ref 0.3–0.9)
Total Bilirubin: 0.4 mg/dL (ref 0.3–1.2)
Total Protein: 5.9 g/dL — ABNORMAL LOW (ref 6.5–8.1)

## 2020-04-15 LAB — BASIC METABOLIC PANEL
Anion gap: 9 (ref 5–15)
BUN: 14 mg/dL (ref 8–23)
CO2: 25 mmol/L (ref 22–32)
Calcium: 8.6 mg/dL — ABNORMAL LOW (ref 8.9–10.3)
Chloride: 106 mmol/L (ref 98–111)
Creatinine, Ser: 0.77 mg/dL (ref 0.44–1.00)
GFR calc Af Amer: >60 mL/min (ref >60)
GFR calc non Af Amer: >60 mL/min (ref >60)
Glucose, Bld: 92 mg/dL (ref 70–99)
Potassium: 3.2 mmol/L — ABNORMAL LOW (ref 3.5–5.1)
Sodium: 140 mmol/L (ref 135–145)

## 2020-04-15 MED ORDER — CEFDINIR 300 MG PO CAPS
300.0000 mg | ORAL_CAPSULE | Freq: Two times a day (BID) | ORAL | Status: DC
  Administered 2020-04-15: 300 mg via ORAL
  Filled 2020-04-15: qty 1

## 2020-04-15 MED ORDER — POTASSIUM CHLORIDE CRYS ER 20 MEQ PO TBCR
40.0000 meq | EXTENDED_RELEASE_TABLET | Freq: Once | ORAL | Status: AC
  Administered 2020-04-15: 40 meq via ORAL
  Filled 2020-04-15: qty 2

## 2020-04-15 MED ORDER — GERHARDT'S BUTT CREAM: 1.0000 "application " | TOPICAL_CREAM | Freq: Every day | CUTANEOUS | Status: DC

## 2020-04-15 MED ORDER — CEFDINIR 300 MG PO CAPS: 300.0000 mg | ORAL_CAPSULE | Freq: Two times a day (BID) | ORAL | 0 refills | Status: DC

## 2020-04-15 MED ORDER — ATORVASTATIN CALCIUM 10 MG PO TABS: 10.0000 mg | ORAL_TABLET | Freq: Every day | ORAL | 0 refills | Status: AC

## 2020-04-15 MED ORDER — OXYCODONE HCL 10 MG PO TABS: 10.0000 mg | ORAL_TABLET | Freq: Three times a day (TID) | ORAL | 0 refills | Status: DC | PRN

## 2020-04-15 MED ORDER — APIXABAN 5 MG PO TABS: 5.0000 mg | ORAL_TABLET | Freq: Two times a day (BID) | ORAL | 1 refills | Status: AC

## 2020-04-15 NOTE — NC FL2 (Signed)
[employer] LEVEL OF CARE SCREENING TOOL     IDENTIFICATION  Patient Name: Kathleen Valenzuela Birthdate: [birth year] Sex: female Admission Date (Current Location): 04/13/2020  [place] and [place] Number:  [facility] [id number] [place] and Address:  [facility] [address], [place]      Provider Number: [id number]  Attending Physician Name and Address:  Orson Eva, MD  Relative Name and Phone Number:  Ziebell,[name] [phone]    Current Level of Care: Hospital Recommended Level of Care: [place] Prior Approval Number:    Date Approved/Denied:   PASRR Number:    Discharge Plan: Domiciliary (Rest home)    Current Diagnoses: Patient Active Problem List   Diagnosis Date Noted  . Abnormal LFTs   . Elevated troponin   . New onset atrial flutter ([place])   . Atrial fibrillation with RVR ([place]) 04/13/2020  . NSTEMI (non-ST elevated myocardial infarction) ([place]) 04/13/2020  . Early onset Alzheimer's dementia ([place])   . Obesity (BMI 30-39.9) 04/21/2017  . Aortic atherosclerosis ([place]) 04/21/2017  . Acute on chronic diastolic CHF (congestive heart failure) ([place]) 04/21/2017  . Diastolic dysfunction with heart failure ([place]) 04/20/2017  . Acute respiratory failure with hypoxia ([place]) 04/19/2017  . Pulmonary edema 04/19/2017  . Acute lower UTI 04/14/2017  . MRSA bacteremia 04/14/2017  . Essential hypertension 04/13/2017  . Vaginal candidiasis 04/12/2017  . Diverticulitis of colon 04/12/2017  . Hypokalemia 04/11/2017  . COPD (chronic obstructive pulmonary disease) ([place]) 04/11/2017  . Diverticulitis 04/11/2017  . Colonic diverticular abscess   . Gastric ulcer 01/10/2017  . Esophageal dysphagia 12/01/2016  . Bipolar disorder ([place]) 08/03/2015  . High cholesterol 08/03/2015  . GERD (gastroesophageal reflux disease) 08/03/2015  . Duodenal stricture 08/03/2015    Orientation RESPIRATION BLADDER Height & Weight     Self, Place   Normal Incontinent Weight: 158 lb 11.7 oz (72 kg) Height:  5\' 4"  (162.6 cm)  BEHAVIORAL SYMPTOMS/MOOD NEUROLOGICAL BOWEL NUTRITION STATUS      Incontinent Diet (Heart Healthy which facilitly says equates to their Regular diet.)  AMBULATORY STATUS COMMUNICATION OF NEEDS Skin   Limited Assist Verbally PU Stage and Appropriate Care (pressure injury, ankle, right; distal deep tissue injury)                       Personal Care Assistance Level of Assistance  Bathing, Feeding, Dressing Bathing Assistance: Limited assistance Feeding assistance: Independent Dressing Assistance: Limited assistance     Functional Limitations Info  Sight, Hearing, Speech Sight Info: Adequate Hearing Info: Adequate Speech Info: Adequate    SPECIAL CARE FACTORS FREQUENCY                       Contractures Contractures Info: Not present    Additional Factors Info  Code Status, Allergies, Psychotropic Code Status Info: Full Code Allergies Info: Penicillins Psychotropic Info: Xanax, Wellbutrin XL, Desyrel, Effexor         Current Medications (04/15/2020):  This is the current hospital active medication list Current Facility-Administered Medications  Medication Dose Route Frequency Provider Last Rate Last Admin  . 0.9 %  sodium chloride infusion  250 mL Intravenous PRN Bonnell Public Tublu, MD      . acetaminophen (TYLENOL) tablet 650 mg  650 mg Oral Q6H PRN Vashti Hey, MD       Or  . acetaminophen (TYLENOL) suppository 650 mg  650 mg Rectal Q6H PRN Vashti Hey, MD      .  ALPRAZolam Duanne Moron) tablet 0.5 mg  0.5 mg Oral TID PRN Bonnell Public Tublu, MD   0.5 mg at 04/14/20 2152  . apixaban (ELIQUIS) tablet 5 mg  5 mg Oral BID Bernerd Pho M, PA-C   5 mg at 04/15/20 0932  . benztropine (COGENTIN) tablet 1 mg  1 mg Oral BID Bonnell Public Tublu, MD   1 mg at 04/15/20 0931  . bisacodyl (DULCOLAX) suppository 10 mg  10 mg Rectal Daily PRN Bonnell Public Tublu, MD      . buPROPion (WELLBUTRIN XL) 24 hr tablet 150 mg  150 mg Oral q morning - 10a Vashti Hey, MD   150 mg at 04/15/20 0931  . cefdinir (OMNICEF) capsule 300 mg  300 mg Oral Therisa Doyne, MD   300 mg at 04/15/20 1144  . Chlorhexidine Gluconate Cloth 2 % PADS 6 each  6 each Topical Daily Vashti Hey, MD   6 each at 04/15/20 0932  . donepezil (ARICEPT) tablet 10 mg  10 mg Oral Q2000 Bonnell Public Tublu, MD   10 mg at 04/14/20 2000  . fluticasone (FLONASE) 50 MCG/ACT nasal spray 1 spray  1 spray Each Nare Daily Vashti Hey, MD   1 spray at 04/15/20 0932  . fluticasone furoate-vilanterol (BREO ELLIPTA) 200-25 MCG/INH 1 puff  1 puff Inhalation Daily Vashti Hey, MD   1 puff at 04/15/20 0832  . furosemide (LASIX) tablet 40 mg  40 mg Oral Daily Bonnell Public Tublu, MD   40 mg at 04/15/20 0932  . gabapentin (NEURONTIN) capsule 300 mg  300 mg Oral TID Vashti Hey, MD   300 mg at 04/15/20 0931  . Gerhardt's butt cream   Topical QID PRN Bonnell Public Tublu, MD      . isosorbide mononitrate (IMDUR) 24 hr tablet 30 mg  30 mg Oral Daily Bonnell Public Tublu, MD   30 mg at 04/15/20 0931  . lurasidone (LATUDA) tablet 80 mg  80 mg Oral QHS Bonnell Public Tublu, MD   80 mg at 04/14/20 2153  . memantine (NAMENDA) tablet 10 mg  10 mg Oral BID Bonnell Public Tublu, MD   10 mg at 04/15/20 0932  . metoprolol succinate (TOPROL-XL) 24 hr tablet 50 mg  50 mg Oral Daily Bonnell Public Tublu, MD   50 mg at 04/15/20 0931  . olopatadine (PATANOL) 0.1 % ophthalmic solution 1 drop  1 drop Both Eyes BID Bonnell Public Tublu, MD   1 drop at 04/15/20 0932  . ondansetron (ZOFRAN) tablet 4 mg  4 mg Oral Q8H PRN Bonnell Public Tublu, MD      . oxyCODONE (Oxy IR/ROXICODONE) immediate release tablet 10 mg  10 mg Oral Q8H PRN Vashti Hey, MD   10 mg at 04/15/20 0811  . pantoprazole  (PROTONIX) EC tablet 40 mg  40 mg Oral BID AC Bonnell Public Tublu, MD   40 mg at 04/15/20 0811  . polyethylene glycol (MIRALAX / GLYCOLAX) packet 17 g  17 g Oral Daily PRN Bonnell Public Tublu, MD      . sodium chloride flush (NS) 0.9 % injection 3 mL  3 mL Intravenous Q12H Bonnell Public Tublu, MD   3 mL at 04/15/20 0933  . sodium chloride flush (NS) 0.9 % injection 3 mL  3 mL Intravenous PRN Bonnell Public Tublu, MD      . tiZANidine (ZANAFLEX) tablet 2 mg  2 mg Oral BID Vashti Hey, MD   2  mg at 04/15/20 0811  . traZODone (DESYREL) tablet 100 mg  100 mg Oral QHS Bonnell Public Tublu, MD   100 mg at 04/14/20 2153  . venlafaxine XR (EFFEXOR-XR) 24 hr capsule 300 mg  300 mg Oral Daily Bonnell Public Tublu, MD   300 mg at 04/15/20 0931     Discharge Medications: STOP taking these medications   ibuprofen 800 MG tablet Commonly known as: ADVIL     TAKE these medications   albuterol 108 (90 Base) MCG/ACT inhaler Commonly known as: VENTOLIN HFA Inhale 2 puffs into the lungs 4 (four) times daily. (0800, 1200, 1600, & 2000)   ALPRAZolam 1 MG tablet Commonly known as: XANAX Take 0.5 tablets (0.5 mg total) by mouth 3 (three) times daily as needed for anxiety. (0800, 1400, & 2000)   apixaban 5 MG Tabs tablet Commonly known as: ELIQUIS Take 1 tablet (5 mg total) by mouth 2 (two) times daily.   atorvastatin 10 MG tablet Commonly known as: LIPITOR Take 1 tablet (10 mg total) by mouth daily at 8 pm. Resume on 04/22/20 if LFTs are back to normal What changed: additional instructions   benztropine 1 MG tablet Commonly known as: COGENTIN Take 1 mg by mouth 2 (two) times daily.   Breo Ellipta 200-25 MCG/INH Aepb Generic drug: fluticasone furoate-vilanterol Inhale 1 puff into the lungs daily. (0800)   buPROPion 150 MG 24 hr tablet Commonly known as: WELLBUTRIN XL Take 150 mg by mouth every morning.   calcium carbonate 500 MG chewable  tablet Commonly known as: TUMS - dosed in mg elemental calcium Chew 2 tablets by mouth every 2 (two) hours as needed for indigestion (CHEW BEFORE SWALLOWING).   cefdinir 300 MG capsule Commonly known as: OMNICEF Take 1 capsule (300 mg total) by mouth every 12 (twelve) hours.   donepezil 10 MG tablet Commonly known as: ARICEPT Take 10 mg by mouth daily at 8 pm.   Ferrous Gluconate 324 (37.5 Fe) MG Tabs Take 324 mg by mouth 2 (two) times daily. (0800 & 2000)   fluticasone 50 MCG/ACT nasal spray Commonly known as: FLONASE Place 1 spray into both nostrils daily. (0800)   furosemide 40 MG tablet Commonly known as: LASIX Take 40 mg by mouth daily. (0800)   gabapentin 300 MG capsule Commonly known as: NEURONTIN Take 300 mg by mouth 3 (three) times daily.   Gerhardt's butt cream Crea Apply 1 application topically daily.   isosorbide mononitrate 30 MG 24 hr tablet Commonly known as: IMDUR Take 30 mg by mouth daily. (0800)   Latuda 80 MG Tabs tablet Generic drug: lurasidone Take 80 mg by mouth at bedtime.   memantine 10 MG tablet Commonly known as: NAMENDA Take 10 mg by mouth 2 (two) times daily.   metoprolol succinate 50 MG 24 hr tablet Commonly known as: TOPROL-XL Take 50 mg by mouth daily. (0800)Take with or immediately following a meal.   ondansetron 4 MG tablet Commonly known as: ZOFRAN Take 4 mg by mouth every 8 (eight) hours as needed for nausea/vomiting.   Oxycodone HCl 10 MG Tabs Take 1 tablet (10 mg total) by mouth every 8 (eight) hours as needed (FOR Pain).   pantoprazole 40 MG tablet Commonly known as: PROTONIX Take 1 tablet (40 mg total) by mouth 2 (two) times daily before a meal.   Pataday 0.2 % Soln Generic drug: Olopatadine HCl Place 1 drop into both eyes daily. (0800)   potassium chloride 10 MEQ tablet Commonly known as: KLOR-CON Take  1 tablet by mouth daily.   Risamine 0.44-20.625 % Oint Generic drug: Menthol-Zinc Oxide Apply  1 application topically 4 (four) times daily as needed (for rash/redness).   tiZANidine 2 MG tablet Commonly known as: ZANAFLEX Take 2 mg by mouth 2 (two) times daily. (0800 & 2000)   traZODone 100 MG tablet Commonly known as: DESYREL Take 100 mg by mouth at bedtime.   venlafaxine XR 150 MG 24 hr capsule Commonly known as: EFFEXOR-XR Take 300 mg by mouth daily.     Relevant Imaging Results:  Relevant Lab Results:   Additional Information SSN: [id number] Clydene Pugh, LCSW

## 2020-04-15 NOTE — Evaluation (Addendum)
Physical Therapy Evaluation Patient Details Name: Kathleen Valenzuela MRN: [id number] DOB: May 09, 1951 Today's Date: 04/15/2020   History of Present Illness  Kathleen Valenzuela is an 69 y.o. female with PMH significant for bipolar disorder, early Alzheimer's disease, COPD, CHF who was sent from her nursing home for complaints of back pain.  In the ED she was noted to be in new onset A. fib/flutter with RVR.  She was also noted to be diaphoretic with elevated troponin 908. Patient herself admits that she has back pain which she states she has been having for the past several days.  She also complains of left leg pain.  Also states that her IV is hurting her very badly.  She admits that she is sweating but states she has been sweating for several days.  Patient specifically denies any chest pain or shortness of breath. Additional information from patient's son Natlie Asfour is that apparently today was the patient's first day in the assisted living facility.  He is also worried that she has been "taking too many of her medications".  He is not sure which of her medication she is taking too much of but he is worried about how accurate her medication usage is.    Clinical Impression  The patient was able to perform all bed mobility without assistance today. She required [place] held assist when attempting sit to stand transfer and displayed imbalance in gait, which was resolved with the addition of a rolling walker. The patient O2 maintained at 90 on room air during ambulation and the patient was able to ambulate at the level of a community ambulator. She reported she was able to get up on her own to use the commode. The patient was educated on the importance of walking with nursing when possible. Patient tolerated sitting up in chair with call bell within arm's reach after therapy while independently feeding herself today. Patient discharged from physical therapy to care of nursing for ambulation daily as tolerated for length of  stay.      Follow Up Recommendations Home health PT    Equipment Recommendations    None recommended by PT.    Recommendations for Other Services   None recommended by PT.     Precautions / Restrictions Precautions Precautions: Fall Restrictions Weight Bearing Restrictions: No      Mobility  Bed Mobility Overal bed mobility: Independent                Transfers Overall transfer level: Modified independent Equipment used: 1 person [place] held assist             General transfer comment: pt regained balance during sit to stand utilizing 1 person Glace held assist  Ambulation/Gait Ambulation/Gait assistance: Modified independent (Device/Increase time);Supervision Gait Distance (Feet): 30 Feet (distance limited by fatigue) Assistive device: Rolling walker (2 wheeled) Gait Pattern/deviations: WFL(Within Functional Limits);Step-through pattern;Trunk flexed   Gait velocity interpretation: >4.37 ft/sec, indicative of normal walking speed    Stairs            Wheelchair Mobility    Modified Rankin (Stroke Patients Only)       Balance Overall balance assessment: Modified Independent                                           Pertinent Vitals/Pain Pain Assessment: No/denies pain    Home Living Family/patient expects to  be discharged to:: Assisted living               Home Equipment: Walker - 2 wheels      Prior Function Level of Independence: Independent with assistive device(s) (walker for long distances)               Petronio Dominance   Dominant Capps: Right    Extremity/Trunk Assessment   Upper Extremity Assessment Upper Extremity Assessment: Overall WFL for tasks assessed    Lower Extremity Assessment Lower Extremity Assessment: Overall WFL for tasks assessed    Cervical / Trunk Assessment Cervical / Trunk Assessment: Normal  Communication   Communication: No difficulties  Cognition Arousal/Alertness:  Awake/alert Behavior During Therapy: WFL for tasks assessed/performed Overall Cognitive Status: Within Functional Limits for tasks assessed                                        General Comments General comments (skin integrity, edema, etc.): mild LOB upon standing    Exercises     Assessment/Plan    PT Assessment All further PT needs can be met in the next venue of care  PT Problem List Decreased strength;Decreased mobility;Decreased range of motion;Decreased activity tolerance;Decreased balance;Cardiopulmonary status limiting activity       PT Treatment Interventions      PT Goals (Current goals can be found in the Care Plan section)  Acute Rehab PT Goals Patient Stated Goal: go to ALF PT Goal Formulation: With patient Time For Goal Achievement: 04/17/20 Potential to Achieve Goals: Good    Frequency     Barriers to discharge        Co-evaluation               AM-PAC PT "6 Clicks" Mobility  Outcome Measure Help needed turning from your back to your side while in a flat bed without using bedrails?: None Help needed moving from lying on your back to sitting on the side of a flat bed without using bedrails?: None Help needed moving to and from a bed to a chair (including a wheelchair)?: A Little Help needed standing up from a chair using your arms (e.g., wheelchair or bedside chair)?: A Little Help needed to walk in hospital room?: A Little Help needed climbing 3-5 steps with a railing? : A Lot 6 Click Score: 19    End of Session   Activity Tolerance: Patient limited by fatigue;Patient tolerated treatment well Patient left: in chair;with call bell/phone within reach Nurse Communication: Mobility status PT Visit Diagnosis: Unsteadiness on feet (R26.81);Muscle weakness (generalized) (M62.81);History of falling (Z91.81);Other abnormalities of gait and mobility (R26.89)    Time: 2248-2500 PT Time Calculation (min) (ACUTE ONLY): 17  min   Charges:   PT Evaluation $PT Eval Low Complexity: 1 Low PT Treatments $Therapeutic Activity: 8-22 mins        2:28 PM , 04/15/20 Karlyn Agee, SPT Physical Therapy with [facility] [phone] office  During this treatment session, the therapist was present, participating in and directing the treatment.  2:28 PM, 04/15/20 Lonell Grandchild, MPT Physical Therapist with [facility] [phone] office [phone] mobile phone

## 2020-04-15 NOTE — Progress Notes (Signed)
Patient dishcharged to [facility] facility per order via transport provided by facility. AVS provided to patient. Patient verbalizes understanding and has no additional questions or concerns at this time. Patient belongings sent with patient. VSS with no c/o pain or discomfort at this time.
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
## 2020-04-15 NOTE — TOC Transition Note (Signed)
Transition of Care [facility]) - CM/SW Discharge Note   Patient Details  Name: Kathleen Valenzuela MRN: [id number] Date of Birth: 09/02/1951  [facility]) CM/SW Contact:  Ihor Gully, LCSW Phone Number: 04/15/2020, 2:03 PM   Clinical Narrative:    Patient admitted from [facility] due to Afib. Patient arrived at the facility on Monday for admission with family and EMS had to be called due to patient's heart rate.   Patient recommended for HHPT. Facility uses [facility] for their PT. Sharyn Lull returned contact and confirmed receipt of referral.  Pharmacy provided Eliquis education and voucher.  TOC signing off.   Final next level of care: Assisted Living Barriers to Discharge: No Barriers Identified   Patient Goals and CMS Choice Patient states their goals for this hospitalization and ongoing recovery are:: return to facility      Discharge Placement                Patient to be transferred to facility by: [facility] staff Name of family member notified: message left for son, [name] Patient and family notified of of transfer: 04/15/20  Discharge Plan and Services                          HH Arranged: PT [place] Date [place]: 04/15/20 Time [place]: 35 Representative spoke with at [place]: Message left for [place]  Social Determinants of Health (SDOH) Interventions     Readmission Risk Interventions No flowsheet data found.

## 2020-04-15 NOTE — Discharge Summary (Signed)
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 Requests   Final    BOTTLES DRAWN AEROBIC AND ANAEROBIC Blood Culture adequate volume   Culture   Final    NO GROWTH 2 DAYS Performed at [facility], [address] [place], [place] [address]    Report Status PENDING  Incomplete  Blood culture (routine x 2)     Status: None (Preliminary result)   Collection Time: 04/13/20  1:58 PM   Specimen: Left Antecubital; Blood  Result Value Ref Range Status   Specimen Description LEFT ANTECUBITAL  Final   Special Requests   Final    BOTTLES DRAWN AEROBIC AND ANAEROBIC Blood Culture adequate volume   Culture   Final    NO GROWTH 2 DAYS Performed at [facility], [address], [place], [place] [address]    Report Status PENDING  Incomplete  SARS Coronavirus 2 by RT PCR (hospital order, performed in [place] hospital lab) Nasopharyngeal Nasopharyngeal Swab     Status: None   Collection Time: 04/13/20  5:12 PM   Specimen: Nasopharyngeal Swab  Result Value Ref Range  Status   SARS Coronavirus 2 NEGATIVE NEGATIVE Final    Comment: (NOTE) SARS-CoV-2 target nucleic acids are NOT DETECTED.  The SARS-CoV-2 RNA is generally detectable in upper and lower respiratory specimens during the acute phase of infection. The lowest concentration of SARS-CoV-2 viral copies this assay can detect is 250 copies / mL. A negative result does not preclude SARS-CoV-2 infection and should not be used as the sole basis for treatment or other patient management decisions.  A negative result may occur with improper specimen collection / handling, submission of specimen other than nasopharyngeal swab, presence of viral mutation(s) within the areas targeted by this assay, and inadequate number of viral copies (<250 copies / mL). A negative result must be combined with clinical observations, patient history, and epidemiological information.  Fact Sheet for Patients:   StrictlyIdeas.no  Fact Sheet for Healthcare Providers: BankingDealers.co.za  This test is not yet approved or  cleared by the [place] FDA and has been authorized for detection and/or diagnosis of SARS-CoV-2 by FDA under an Emergency Use Authorization (EUA).  This EUA will remain in effect (meaning this test can be used) for the duration of the COVID-19 declaration under Section 564(b)(1) of the Act, 21 U.S.C. section 360bbb-3(b)(1), unless the authorization is terminated or revoked sooner.  Performed at [facility], [address] [place], [place] [address]   MRSA PCR Screening     Status: None   Collection Time: 04/13/20  8:51 PM   Specimen: Nasopharyngeal  Result Value Ref Range Status   MRSA by PCR NEGATIVE NEGATIVE Final    Comment:        The GeneXpert MRSA Assay (FDA approved for NASAL specimens only), is one component of a comprehensive MRSA colonization surveillance program. It is not intended to diagnose MRSA infection nor to guide  or monitor treatment for MRSA infections. Performed at [facility], [address] [place], [place] [address]      Labs: Basic Metabolic Panel: Recent Labs  Lab 04/13/20 1314 04/13/20 1314 04/14/20 0240 04/14/20 0240 04/14/20 0700 04/15/20 0413  NA 135  --  138  --   --  140  K 2.7*   < > 3.2*   < > 2.9* 3.2*  CL 105  --  113*  --   --  106  CO2 20*  --  20*  --   --  25  GLUCOSE 118*  --  114*  --   --  92  BUN 35*  --  22  --   --  14  CREATININE 0.96  --  0.79  --   --  0.77  CALCIUM 8.5*  --  7.9*  --   --  8.6*  MG 2.1  --   --   --  1.7  --    < > = values in this interval not displayed.   Liver Function Tests: Recent Labs  Lab 04/13/20 1314 04/15/20 0413  AST 181* 40  ALT 288* 127*  ALKPHOS 88 74  BILITOT 0.8 0.4  PROT 6.8 5.9*  ALBUMIN 3.1* 2.8*   Recent Labs  Lab 04/13/20 1314  LIPASE 20   No results for input(s): AMMONIA in the last 168 hours. CBC: Recent Labs  Lab 04/13/20 1314 04/14/20 0240 04/15/20 0413  WBC 21.5* 18.0* 13.5*  NEUTROABS 17.6*  --   --   HGB 13.9 11.4* 11.9*  HCT 41.2 34.5* 36.4  MCV 90.0 93.2 92.9  PLT 284 231 220  Cardiac Enzymes: No results for input(s): CKTOTAL, CKMB, CKMBINDEX, TROPONINI in the last 168 hours. BNP: Invalid input(s): POCBNP CBG: No results for input(s): GLUCAP in the last 168 hours.  Time coordinating discharge:  36 minutes  Signed:  Orson Eva, DO [facility] Pager: [phone] 04/15/2020, 11:03 AM

## 2020-04-15 NOTE — Care Management Important Message (Signed)
Important Message  Patient Details  Name: Kathleen Valenzuela MRN: [id number] Date of Birth: 09-19-1950   Medicare Important Message Given:  Yes     Ihor Gully, LCSW 04/15/2020, 12:24 PM

## 2020-04-17 DIAGNOSIS — F17210 Nicotine dependence, cigarettes, uncomplicated: Secondary | ICD-10-CM | POA: Diagnosis not present

## 2020-04-17 DIAGNOSIS — M170 Bilateral primary osteoarthritis of knee: Secondary | ICD-10-CM | POA: Diagnosis not present

## 2020-04-17 DIAGNOSIS — Z299 Encounter for prophylactic measures, unspecified: Secondary | ICD-10-CM | POA: Diagnosis not present

## 2020-04-17 DIAGNOSIS — S80822S Blister (nonthermal), left lower leg, sequela: Secondary | ICD-10-CM | POA: Diagnosis not present

## 2020-04-17 DIAGNOSIS — S80821A Blister (nonthermal), right lower leg, initial encounter: Secondary | ICD-10-CM | POA: Diagnosis not present

## 2020-04-17 DIAGNOSIS — I10 Essential (primary) hypertension: Secondary | ICD-10-CM | POA: Diagnosis not present

## 2020-04-18 LAB — CULTURE, BLOOD (ROUTINE X 2)
Culture: NO GROWTH
Culture: NO GROWTH
Special Requests: ADEQUATE
Special Requests: ADEQUATE

## 2020-04-20 DIAGNOSIS — G300 Alzheimer's disease with early onset: Secondary | ICD-10-CM | POA: Diagnosis not present

## 2020-04-20 DIAGNOSIS — M6281 Muscle weakness (generalized): Secondary | ICD-10-CM | POA: Diagnosis not present

## 2020-04-20 DIAGNOSIS — I4891 Unspecified atrial fibrillation: Secondary | ICD-10-CM | POA: Diagnosis not present

## 2020-04-20 DIAGNOSIS — Z87891 Personal history of nicotine dependence: Secondary | ICD-10-CM | POA: Diagnosis not present

## 2020-04-20 DIAGNOSIS — J449 Chronic obstructive pulmonary disease, unspecified: Secondary | ICD-10-CM | POA: Diagnosis not present

## 2020-04-20 DIAGNOSIS — M545 Low back pain: Secondary | ICD-10-CM | POA: Diagnosis not present

## 2020-04-20 DIAGNOSIS — I5032 Chronic diastolic (congestive) heart failure: Secondary | ICD-10-CM | POA: Diagnosis not present

## 2020-04-20 DIAGNOSIS — I119 Hypertensive heart disease without heart failure: Secondary | ICD-10-CM | POA: Diagnosis not present

## 2020-04-21 DIAGNOSIS — U071 COVID-19: Secondary | ICD-10-CM | POA: Diagnosis not present

## 2020-04-27 DIAGNOSIS — Z87891 Personal history of nicotine dependence: Secondary | ICD-10-CM | POA: Diagnosis not present

## 2020-04-27 DIAGNOSIS — G300 Alzheimer's disease with early onset: Secondary | ICD-10-CM | POA: Diagnosis not present

## 2020-04-27 DIAGNOSIS — J449 Chronic obstructive pulmonary disease, unspecified: Secondary | ICD-10-CM | POA: Diagnosis not present

## 2020-04-27 DIAGNOSIS — I5032 Chronic diastolic (congestive) heart failure: Secondary | ICD-10-CM | POA: Diagnosis not present

## 2020-04-27 DIAGNOSIS — M6281 Muscle weakness (generalized): Secondary | ICD-10-CM | POA: Diagnosis not present

## 2020-04-27 DIAGNOSIS — I119 Hypertensive heart disease without heart failure: Secondary | ICD-10-CM | POA: Diagnosis not present

## 2020-04-27 DIAGNOSIS — M545 Low back pain: Secondary | ICD-10-CM | POA: Diagnosis not present

## 2020-04-27 DIAGNOSIS — I4891 Unspecified atrial fibrillation: Secondary | ICD-10-CM | POA: Diagnosis not present

## 2020-04-28 DIAGNOSIS — U071 COVID-19: Secondary | ICD-10-CM | POA: Diagnosis not present

## 2020-04-29 DIAGNOSIS — I5032 Chronic diastolic (congestive) heart failure: Secondary | ICD-10-CM | POA: Diagnosis not present

## 2020-04-29 DIAGNOSIS — J449 Chronic obstructive pulmonary disease, unspecified: Secondary | ICD-10-CM | POA: Diagnosis not present

## 2020-04-29 DIAGNOSIS — M6281 Muscle weakness (generalized): Secondary | ICD-10-CM | POA: Diagnosis not present

## 2020-04-29 DIAGNOSIS — I119 Hypertensive heart disease without heart failure: Secondary | ICD-10-CM | POA: Diagnosis not present

## 2020-04-29 DIAGNOSIS — U071 COVID-19: Secondary | ICD-10-CM | POA: Diagnosis not present

## 2020-04-29 DIAGNOSIS — I4891 Unspecified atrial fibrillation: Secondary | ICD-10-CM | POA: Diagnosis not present

## 2020-04-29 DIAGNOSIS — G300 Alzheimer's disease with early onset: Secondary | ICD-10-CM | POA: Diagnosis not present

## 2020-04-29 DIAGNOSIS — Z87891 Personal history of nicotine dependence: Secondary | ICD-10-CM | POA: Diagnosis not present

## 2020-04-29 DIAGNOSIS — M545 Low back pain: Secondary | ICD-10-CM | POA: Diagnosis not present

## 2020-04-30 DIAGNOSIS — U071 COVID-19: Secondary | ICD-10-CM | POA: Diagnosis not present

## 2020-05-04 DIAGNOSIS — Z299 Encounter for prophylactic measures, unspecified: Secondary | ICD-10-CM | POA: Diagnosis not present

## 2020-05-04 DIAGNOSIS — I5032 Chronic diastolic (congestive) heart failure: Secondary | ICD-10-CM | POA: Diagnosis not present

## 2020-05-04 DIAGNOSIS — M6281 Muscle weakness (generalized): Secondary | ICD-10-CM | POA: Diagnosis not present

## 2020-05-04 DIAGNOSIS — M545 Low back pain: Secondary | ICD-10-CM | POA: Diagnosis not present

## 2020-05-04 DIAGNOSIS — I10 Essential (primary) hypertension: Secondary | ICD-10-CM | POA: Diagnosis not present

## 2020-05-04 DIAGNOSIS — G300 Alzheimer's disease with early onset: Secondary | ICD-10-CM | POA: Diagnosis not present

## 2020-05-04 DIAGNOSIS — Z87891 Personal history of nicotine dependence: Secondary | ICD-10-CM | POA: Diagnosis not present

## 2020-05-04 DIAGNOSIS — J449 Chronic obstructive pulmonary disease, unspecified: Secondary | ICD-10-CM | POA: Diagnosis not present

## 2020-05-04 DIAGNOSIS — I119 Hypertensive heart disease without heart failure: Secondary | ICD-10-CM | POA: Diagnosis not present

## 2020-05-04 DIAGNOSIS — I4891 Unspecified atrial fibrillation: Secondary | ICD-10-CM | POA: Diagnosis not present

## 2020-05-05 DIAGNOSIS — U071 COVID-19: Secondary | ICD-10-CM | POA: Diagnosis not present

## 2020-05-06 DIAGNOSIS — M6281 Muscle weakness (generalized): Secondary | ICD-10-CM | POA: Diagnosis not present

## 2020-05-06 DIAGNOSIS — G300 Alzheimer's disease with early onset: Secondary | ICD-10-CM | POA: Diagnosis not present

## 2020-05-06 DIAGNOSIS — I5032 Chronic diastolic (congestive) heart failure: Secondary | ICD-10-CM | POA: Diagnosis not present

## 2020-05-06 DIAGNOSIS — M545 Low back pain: Secondary | ICD-10-CM | POA: Diagnosis not present

## 2020-05-06 DIAGNOSIS — Z87891 Personal history of nicotine dependence: Secondary | ICD-10-CM | POA: Diagnosis not present

## 2020-05-06 DIAGNOSIS — I119 Hypertensive heart disease without heart failure: Secondary | ICD-10-CM | POA: Diagnosis not present

## 2020-05-06 DIAGNOSIS — I4891 Unspecified atrial fibrillation: Secondary | ICD-10-CM | POA: Diagnosis not present

## 2020-05-06 DIAGNOSIS — J449 Chronic obstructive pulmonary disease, unspecified: Secondary | ICD-10-CM | POA: Diagnosis not present

## 2020-05-07 DIAGNOSIS — U071 COVID-19: Secondary | ICD-10-CM | POA: Diagnosis not present

## 2020-05-12 DIAGNOSIS — M6281 Muscle weakness (generalized): Secondary | ICD-10-CM | POA: Diagnosis not present

## 2020-05-12 DIAGNOSIS — I4891 Unspecified atrial fibrillation: Secondary | ICD-10-CM | POA: Diagnosis not present

## 2020-05-12 DIAGNOSIS — I119 Hypertensive heart disease without heart failure: Secondary | ICD-10-CM | POA: Diagnosis not present

## 2020-05-12 DIAGNOSIS — U071 COVID-19: Secondary | ICD-10-CM | POA: Diagnosis not present

## 2020-05-12 DIAGNOSIS — J449 Chronic obstructive pulmonary disease, unspecified: Secondary | ICD-10-CM | POA: Diagnosis not present

## 2020-05-12 DIAGNOSIS — G300 Alzheimer's disease with early onset: Secondary | ICD-10-CM | POA: Diagnosis not present

## 2020-05-12 DIAGNOSIS — I5032 Chronic diastolic (congestive) heart failure: Secondary | ICD-10-CM | POA: Diagnosis not present

## 2020-05-12 DIAGNOSIS — M545 Low back pain: Secondary | ICD-10-CM | POA: Diagnosis not present

## 2020-05-12 DIAGNOSIS — Z87891 Personal history of nicotine dependence: Secondary | ICD-10-CM | POA: Diagnosis not present

## 2020-05-13 NOTE — Progress Notes (Deleted)
Cardiology Office Note   Date:  05/13/2020   ID:  Kathleen Valenzuela, DOB 1951-04-19, MRN [id number]  PCP:  Monico Blitz, MD  Cardiologist:  Dr Harrington Challenger    No chief complaint on file.     History of Present Illness: Kathleen Valenzuela is a 68 y.o. female who presents for ***  a hx of HTN, COPD, HLD who is being seen today for the evaluation of Atrial flutter with UTI    history of hypertension, HLD, bipolar disorder, early dementia, COPD, aortic atherosclerosis on abdominal CT 12/09/2019 lives in [place].  Echo 2018 normal LVEF 55 to 60% with grade 1 DD.  eliquis and BB Needs BMP lfts  Past Medical History:  Diagnosis Date   Anemia    Anxiety    Aortic atherosclerosis ([place] [place]) 04/21/2017   Arthritis    Bipolar 1 disorder (HCC)    Chronic back pain    COPD (chronic obstructive pulmonary disease) (HCC)    Diastolic dysfunction with heart failure ([place]) 04/20/2017   Duodenal stricture    Early onset Alzheimer's dementia (HCC)    GERD (gastroesophageal reflux disease)    Heart murmur    High cholesterol    Hypertension    Shortness of breath dyspnea     Past Surgical History:  Procedure Laterality Date   BIOPSY  01/06/2017   Procedure: BIOPSY;  Surgeon: Rogene Houston, MD;  [facility];  Service: Endoscopy;;  esophageal biopsy   CESAREAN SECTION     COLONOSCOPY     ESOPHAGEAL DILATION N/A 10/29/2015   Procedure: ESOPHAGEAL DILATION;  Surgeon: Rogene Houston, MD;  [facility];  Service: Endoscopy;  Laterality: N/A;   ESOPHAGEAL DILATION N/A 01/29/2016   Procedure: ESOPHAGEAL DILATION;  Surgeon: Rogene Houston, MD;  [facility];  Service: Endoscopy;  Laterality: N/A;   ESOPHAGEAL DILATION N/A 01/06/2017   Procedure: ESOPHAGEAL DILATION;  Surgeon: Rogene Houston, MD;  [facility];  Service: Endoscopy;  Laterality: N/A;   ESOPHAGOGASTRODUODENOSCOPY N/A 10/29/2015   Procedure: ESOPHAGOGASTRODUODENOSCOPY (EGD);  Surgeon: Rogene Houston, MD;  [facility];  Service: Endoscopy;  Laterality: N/A;  1200   ESOPHAGOGASTRODUODENOSCOPY (EGD) WITH PROPOFOL N/A 01/29/2016   Procedure: ESOPHAGOGASTRODUODENOSCOPY (EGD) WITH PROPOFOL;  Surgeon: Rogene Houston, MD;  [facility];  Service: Endoscopy;  Laterality: N/A;  7:30 - moved to 4/21 @11 : 25 - Ann notified pt to arrive at 10:00   ESOPHAGOGASTRODUODENOSCOPY (EGD) WITH PROPOFOL N/A 01/06/2017   Procedure: ESOPHAGOGASTRODUODENOSCOPY (EGD) WITH PROPOFOL;  Surgeon: Rogene Houston, MD;  [facility];  Service: Endoscopy;  Laterality: N/A;  11:20   ESOPHAGOGASTRODUODENOSCOPY (EGD) WITH PROPOFOL N/A 02/17/2017   Procedure: ESOPHAGOGASTRODUODENOSCOPY (EGD) WITH PROPOFOL;  Surgeon: Rogene Houston, MD;  [facility];  Service: Endoscopy;  Laterality: N/A;  10:30   FOOT SURGERY Right    bunionectomy   HEMORRHOID SURGERY       Current Outpatient Medications  Medication Sig Dispense Refill   albuterol (PROVENTIL HFA;VENTOLIN HFA) 108 (90 BASE) MCG/ACT inhaler Inhale 2 puffs into the lungs 4 (four) times daily. (0800, 1200, 1600, & 2000)     ALPRAZolam (XANAX) 1 MG tablet Take 0.5 tablets (0.5 mg total) by mouth 3 (three) times daily as needed for anxiety. (0800, 1400, & 2000) 1 tablet 0   apixaban (ELIQUIS) 5 MG TABS tablet Take 1 tablet (5 mg total) by mouth 2 (two) times daily. 60 tablet  1   atorvastatin (LIPITOR) 10 MG tablet Take 1 tablet (10 mg total) by mouth daily at 8 pm. Resume on 04/22/20 if LFTs are back to normal 30 tablet 0   benztropine (COGENTIN) 1 MG tablet Take 1 mg by mouth 2 (two) times daily.     buPROPion (WELLBUTRIN XL) 150 MG 24 hr tablet Take 150 mg by mouth every morning.     calcium carbonate (TUMS - DOSED IN MG ELEMENTAL CALCIUM) 500 MG chewable tablet Chew 2 tablets by mouth every 2 (two) hours as needed for indigestion (CHEW BEFORE SWALLOWING).     cefdinir (OMNICEF) 300 MG capsule Take 1 capsule (300 mg  total) by mouth every 12 (twelve) hours. 6 capsule 0   donepezil (ARICEPT) 10 MG tablet Take 10 mg by mouth daily at 8 pm.     Ferrous Gluconate 324 (37.5 Fe) MG TABS Take 324 mg by mouth 2 (two) times daily. (0800 & 2000)     fluticasone (FLONASE) 50 MCG/ACT nasal spray Place 1 spray into both nostrils daily. (0800)     Fluticasone Furoate-Vilanterol (BREO ELLIPTA) 200-25 MCG/INH AEPB Inhale 1 puff into the lungs daily. (0800)     furosemide (LASIX) 40 MG tablet Take 40 mg by mouth daily. (0800)     gabapentin (NEURONTIN) 300 MG capsule Take 300 mg by mouth 3 (three) times daily.     isosorbide mononitrate (IMDUR) 30 MG 24 hr tablet Take 30 mg by mouth daily. (0800)     LATUDA 80 MG TABS tablet Take 80 mg by mouth at bedtime.     memantine (NAMENDA) 10 MG tablet Take 10 mg by mouth 2 (two) times daily.     Menthol-Zinc Oxide (RISAMINE) 0.44-20.625 % OINT Apply 1 application topically 4 (four) times daily as needed (for rash/redness).     metoprolol succinate (TOPROL-XL) 50 MG 24 hr tablet Take 50 mg by mouth daily. (0800)Take with or immediately following a meal.     Nystatin (GERHARDT'S BUTT CREAM) CREA Apply 1 application topically daily.     Olopatadine HCl (PATADAY) 0.2 % SOLN Place 1 drop into both eyes daily. (0800)     ondansetron (ZOFRAN) 4 MG tablet Take 4 mg by mouth every 8 (eight) hours as needed for nausea/vomiting.     Oxycodone HCl 10 MG TABS Take 1 tablet (10 mg total) by mouth every 8 (eight) hours as needed (FOR Pain). 10 tablet 0   pantoprazole (PROTONIX) 40 MG tablet Take 1 tablet (40 mg total) by mouth 2 (two) times daily before a meal. 60 tablet 2   potassium chloride (KLOR-CON) 10 MEQ tablet Take 1 tablet by mouth daily.     tiZANidine (ZANAFLEX) 2 MG tablet Take 2 mg by mouth 2 (two) times daily. (0800 & 2000)     traZODone (DESYREL) 100 MG tablet Take 100 mg by mouth at bedtime.     venlafaxine XR (EFFEXOR-XR) 150 MG 24 hr capsule Take 300 mg by mouth  daily.     No current facility-administered medications for this visit.    Allergies:   Penicillins    Social History:  The patient  reports that she quit smoking about 4 years ago. Her smoking use included cigarettes. She has a 70.00 pack-year smoking history. She has never used smokeless tobacco. She reports that she does not drink alcohol and does not use drugs.   Family History:  The patient's ***family history is not on file.    ROS:  General:no colds or  fevers, no weight changes Skin:no rashes or ulcers HEENT:no blurred vision, no congestion CV:see HPI PUL:see HPI GI:no diarrhea constipation or melena, no indigestion GU:no hematuria, no dysuria MS:no joint pain, no claudication Neuro:no syncope, no lightheadedness Endo:no diabetes, no thyroid disease Wt Readings from Last 3 Encounters:  04/14/20 158 lb 11.7 oz (72 kg)  12/09/19 176 lb (79.8 kg)  04/24/17 194 lb 1.6 oz (88 kg)     PHYSICAL EXAM: VS:  There were no vitals taken for this visit. , BMI There is no height or weight on file to calculate BMI. General:Pleasant affect, NAD Skin:Warm and dry, brisk capillary refill HEENT:normocephalic, sclera clear, mucus membranes moist Neck:supple, no JVD, no bruits  Heart:S1S2 RRR without murmur, gallup, rub or click Lungs:clear without rales, rhonchi, or wheezes NOM:VEHM, non tender, + BS, do not palpate liver spleen or masses Ext:no lower ext edema, 2+ pedal pulses, 2+ radial pulses Neuro:alert and oriented, MAE, follows commands, + facial symmetry    EKG:  EKG is ordered today. The ekg ordered today demonstrates ***   Recent Labs: 04/13/2020: TSH 0.225 04/14/2020: Magnesium 1.7 04/15/2020: ALT 127; BUN 14; Creatinine, Ser 0.77; Hemoglobin 11.9; Platelets 220; Potassium 3.2; Sodium 140    Lipid Panel No results found for: CHOL, TRIG, HDL, CHOLHDL, VLDL, LDLCALC, LDLDIRECT     Other studies Reviewed: Additional studies/ records that were reviewed today include:  . Echocardiogram: 04/14/2020 IMPRESSIONS    1. Distal septal hypokinesis ? in setting LBBB . Left ventricular  ejection fraction, by estimation, is 50 to 55%. The left ventricle has low  normal function. The left ventricle has no regional wall motion  abnormalities. There is mild left ventricular  hypertrophy. Left ventricular diastolic parameters were normal.  2. Right ventricular systolic function is normal. The right ventricular  size is normal.  3. Left atrial size was moderately dilated.  4. The mitral valve is degenerative. Trivial mitral valve regurgitation.  No evidence of mitral stenosis.  5. The aortic valve is tricuspid. Aortic valve regurgitation is mild.  Mild aortic valve stenosis.  6. The inferior vena cava is normal in size with greater than 50%  respiratory variability, suggesting right atrial pressure of 3 mmHg.    ASSESSMENT AND PLAN:  1.  ***   Current medicines are reviewed with the patient today.  The patient Has no concerns regarding medicines.  The following changes have been made:  See above Labs/ tests ordered today include:see above  Disposition:   FU:  see above  Signed, Cecilie Kicks, NP  05/13/2020 10:26 PM    [facility] [place], [place], [place] Phone: [phone]; Fax: [phone]

## 2020-05-14 ENCOUNTER — Ambulatory Visit: Payer: Medicare Other | Admitting: Cardiology

## 2020-05-14 DIAGNOSIS — U071 COVID-19: Secondary | ICD-10-CM | POA: Diagnosis not present

## 2020-05-14 DIAGNOSIS — I4891 Unspecified atrial fibrillation: Secondary | ICD-10-CM | POA: Diagnosis not present

## 2020-05-14 DIAGNOSIS — I5032 Chronic diastolic (congestive) heart failure: Secondary | ICD-10-CM | POA: Diagnosis not present

## 2020-05-14 DIAGNOSIS — I119 Hypertensive heart disease without heart failure: Secondary | ICD-10-CM | POA: Diagnosis not present

## 2020-05-14 DIAGNOSIS — G300 Alzheimer's disease with early onset: Secondary | ICD-10-CM | POA: Diagnosis not present

## 2020-05-14 DIAGNOSIS — J449 Chronic obstructive pulmonary disease, unspecified: Secondary | ICD-10-CM | POA: Diagnosis not present

## 2020-05-14 DIAGNOSIS — Z87891 Personal history of nicotine dependence: Secondary | ICD-10-CM | POA: Diagnosis not present

## 2020-05-14 DIAGNOSIS — M6281 Muscle weakness (generalized): Secondary | ICD-10-CM | POA: Diagnosis not present

## 2020-05-14 DIAGNOSIS — M545 Low back pain: Secondary | ICD-10-CM | POA: Diagnosis not present

## 2020-05-17 DIAGNOSIS — Z87891 Personal history of nicotine dependence: Secondary | ICD-10-CM | POA: Diagnosis not present

## 2020-05-17 DIAGNOSIS — I119 Hypertensive heart disease without heart failure: Secondary | ICD-10-CM | POA: Diagnosis not present

## 2020-05-17 DIAGNOSIS — I5032 Chronic diastolic (congestive) heart failure: Secondary | ICD-10-CM | POA: Diagnosis not present

## 2020-05-17 DIAGNOSIS — M545 Low back pain: Secondary | ICD-10-CM | POA: Diagnosis not present

## 2020-05-17 DIAGNOSIS — M6281 Muscle weakness (generalized): Secondary | ICD-10-CM | POA: Diagnosis not present

## 2020-05-17 DIAGNOSIS — G300 Alzheimer's disease with early onset: Secondary | ICD-10-CM | POA: Diagnosis not present

## 2020-05-17 DIAGNOSIS — J449 Chronic obstructive pulmonary disease, unspecified: Secondary | ICD-10-CM | POA: Diagnosis not present

## 2020-05-17 DIAGNOSIS — I4891 Unspecified atrial fibrillation: Secondary | ICD-10-CM | POA: Diagnosis not present

## 2020-05-18 DIAGNOSIS — U071 COVID-19: Secondary | ICD-10-CM | POA: Diagnosis not present

## 2020-05-21 DIAGNOSIS — J449 Chronic obstructive pulmonary disease, unspecified: Secondary | ICD-10-CM | POA: Diagnosis not present

## 2020-05-21 DIAGNOSIS — Z87891 Personal history of nicotine dependence: Secondary | ICD-10-CM | POA: Diagnosis not present

## 2020-05-21 DIAGNOSIS — I4891 Unspecified atrial fibrillation: Secondary | ICD-10-CM | POA: Diagnosis not present

## 2020-05-21 DIAGNOSIS — G300 Alzheimer's disease with early onset: Secondary | ICD-10-CM | POA: Diagnosis not present

## 2020-05-21 DIAGNOSIS — U071 COVID-19: Secondary | ICD-10-CM | POA: Diagnosis not present

## 2020-05-21 DIAGNOSIS — M6281 Muscle weakness (generalized): Secondary | ICD-10-CM | POA: Diagnosis not present

## 2020-05-21 DIAGNOSIS — I5032 Chronic diastolic (congestive) heart failure: Secondary | ICD-10-CM | POA: Diagnosis not present

## 2020-05-21 DIAGNOSIS — M545 Low back pain: Secondary | ICD-10-CM | POA: Diagnosis not present

## 2020-05-21 DIAGNOSIS — I119 Hypertensive heart disease without heart failure: Secondary | ICD-10-CM | POA: Diagnosis not present

## 2020-05-25 DIAGNOSIS — G300 Alzheimer's disease with early onset: Secondary | ICD-10-CM | POA: Diagnosis not present

## 2020-05-25 DIAGNOSIS — Z87891 Personal history of nicotine dependence: Secondary | ICD-10-CM | POA: Diagnosis not present

## 2020-05-25 DIAGNOSIS — I4891 Unspecified atrial fibrillation: Secondary | ICD-10-CM | POA: Diagnosis not present

## 2020-05-25 DIAGNOSIS — I5032 Chronic diastolic (congestive) heart failure: Secondary | ICD-10-CM | POA: Diagnosis not present

## 2020-05-25 DIAGNOSIS — M6281 Muscle weakness (generalized): Secondary | ICD-10-CM | POA: Diagnosis not present

## 2020-05-25 DIAGNOSIS — J449 Chronic obstructive pulmonary disease, unspecified: Secondary | ICD-10-CM | POA: Diagnosis not present

## 2020-05-25 DIAGNOSIS — M545 Low back pain: Secondary | ICD-10-CM | POA: Diagnosis not present

## 2020-05-25 DIAGNOSIS — I119 Hypertensive heart disease without heart failure: Secondary | ICD-10-CM | POA: Diagnosis not present

## 2020-05-26 DIAGNOSIS — U071 COVID-19: Secondary | ICD-10-CM | POA: Diagnosis not present

## 2020-05-28 DIAGNOSIS — Z7689 Persons encountering health services in other specified circumstances: Secondary | ICD-10-CM | POA: Diagnosis not present

## 2020-05-28 DIAGNOSIS — Z87891 Personal history of nicotine dependence: Secondary | ICD-10-CM | POA: Diagnosis not present

## 2020-05-28 DIAGNOSIS — M545 Low back pain: Secondary | ICD-10-CM | POA: Diagnosis not present

## 2020-05-28 DIAGNOSIS — I4891 Unspecified atrial fibrillation: Secondary | ICD-10-CM | POA: Diagnosis not present

## 2020-05-28 DIAGNOSIS — R350 Frequency of micturition: Secondary | ICD-10-CM | POA: Diagnosis not present

## 2020-05-28 DIAGNOSIS — U071 COVID-19: Secondary | ICD-10-CM | POA: Diagnosis not present

## 2020-05-28 DIAGNOSIS — M6281 Muscle weakness (generalized): Secondary | ICD-10-CM | POA: Diagnosis not present

## 2020-05-28 DIAGNOSIS — J449 Chronic obstructive pulmonary disease, unspecified: Secondary | ICD-10-CM | POA: Diagnosis not present

## 2020-05-28 DIAGNOSIS — Z7189 Other specified counseling: Secondary | ICD-10-CM | POA: Diagnosis not present

## 2020-05-28 DIAGNOSIS — G300 Alzheimer's disease with early onset: Secondary | ICD-10-CM | POA: Diagnosis not present

## 2020-05-28 DIAGNOSIS — Z0000 Encounter for general adult medical examination without abnormal findings: Secondary | ICD-10-CM | POA: Diagnosis not present

## 2020-05-28 DIAGNOSIS — I119 Hypertensive heart disease without heart failure: Secondary | ICD-10-CM | POA: Diagnosis not present

## 2020-05-28 DIAGNOSIS — Z299 Encounter for prophylactic measures, unspecified: Secondary | ICD-10-CM | POA: Diagnosis not present

## 2020-05-28 DIAGNOSIS — I5032 Chronic diastolic (congestive) heart failure: Secondary | ICD-10-CM | POA: Diagnosis not present

## 2020-06-01 DIAGNOSIS — G300 Alzheimer's disease with early onset: Secondary | ICD-10-CM | POA: Diagnosis not present

## 2020-06-01 DIAGNOSIS — I119 Hypertensive heart disease without heart failure: Secondary | ICD-10-CM | POA: Diagnosis not present

## 2020-06-01 DIAGNOSIS — I5032 Chronic diastolic (congestive) heart failure: Secondary | ICD-10-CM | POA: Diagnosis not present

## 2020-06-01 DIAGNOSIS — M6281 Muscle weakness (generalized): Secondary | ICD-10-CM | POA: Diagnosis not present

## 2020-06-01 DIAGNOSIS — Z87891 Personal history of nicotine dependence: Secondary | ICD-10-CM | POA: Diagnosis not present

## 2020-06-01 DIAGNOSIS — M545 Low back pain: Secondary | ICD-10-CM | POA: Diagnosis not present

## 2020-06-01 DIAGNOSIS — J449 Chronic obstructive pulmonary disease, unspecified: Secondary | ICD-10-CM | POA: Diagnosis not present

## 2020-06-01 DIAGNOSIS — I4891 Unspecified atrial fibrillation: Secondary | ICD-10-CM | POA: Diagnosis not present

## 2020-06-02 DIAGNOSIS — U071 COVID-19: Secondary | ICD-10-CM | POA: Diagnosis not present

## 2020-06-03 DIAGNOSIS — M545 Low back pain: Secondary | ICD-10-CM | POA: Diagnosis not present

## 2020-06-03 DIAGNOSIS — I119 Hypertensive heart disease without heart failure: Secondary | ICD-10-CM | POA: Diagnosis not present

## 2020-06-03 DIAGNOSIS — G300 Alzheimer's disease with early onset: Secondary | ICD-10-CM | POA: Diagnosis not present

## 2020-06-03 DIAGNOSIS — J449 Chronic obstructive pulmonary disease, unspecified: Secondary | ICD-10-CM | POA: Diagnosis not present

## 2020-06-03 DIAGNOSIS — M6281 Muscle weakness (generalized): Secondary | ICD-10-CM | POA: Diagnosis not present

## 2020-06-03 DIAGNOSIS — I4891 Unspecified atrial fibrillation: Secondary | ICD-10-CM | POA: Diagnosis not present

## 2020-06-03 DIAGNOSIS — I5032 Chronic diastolic (congestive) heart failure: Secondary | ICD-10-CM | POA: Diagnosis not present

## 2020-06-03 DIAGNOSIS — Z87891 Personal history of nicotine dependence: Secondary | ICD-10-CM | POA: Diagnosis not present

## 2020-06-04 DIAGNOSIS — U071 COVID-19: Secondary | ICD-10-CM | POA: Diagnosis not present

## 2020-06-08 DIAGNOSIS — I4891 Unspecified atrial fibrillation: Secondary | ICD-10-CM | POA: Diagnosis not present

## 2020-06-08 DIAGNOSIS — J449 Chronic obstructive pulmonary disease, unspecified: Secondary | ICD-10-CM | POA: Diagnosis not present

## 2020-06-08 DIAGNOSIS — Z87891 Personal history of nicotine dependence: Secondary | ICD-10-CM | POA: Diagnosis not present

## 2020-06-08 DIAGNOSIS — G300 Alzheimer's disease with early onset: Secondary | ICD-10-CM | POA: Diagnosis not present

## 2020-06-08 DIAGNOSIS — I5032 Chronic diastolic (congestive) heart failure: Secondary | ICD-10-CM | POA: Diagnosis not present

## 2020-06-08 DIAGNOSIS — M6281 Muscle weakness (generalized): Secondary | ICD-10-CM | POA: Diagnosis not present

## 2020-06-08 DIAGNOSIS — I119 Hypertensive heart disease without heart failure: Secondary | ICD-10-CM | POA: Diagnosis not present

## 2020-06-08 DIAGNOSIS — M545 Low back pain, unspecified: Secondary | ICD-10-CM | POA: Diagnosis not present

## 2020-06-09 DIAGNOSIS — U071 COVID-19: Secondary | ICD-10-CM | POA: Diagnosis not present

## 2020-06-10 DIAGNOSIS — I4891 Unspecified atrial fibrillation: Secondary | ICD-10-CM | POA: Diagnosis not present

## 2020-06-10 DIAGNOSIS — J449 Chronic obstructive pulmonary disease, unspecified: Secondary | ICD-10-CM | POA: Diagnosis not present

## 2020-06-10 DIAGNOSIS — G300 Alzheimer's disease with early onset: Secondary | ICD-10-CM | POA: Diagnosis not present

## 2020-06-10 DIAGNOSIS — M545 Low back pain, unspecified: Secondary | ICD-10-CM | POA: Diagnosis not present

## 2020-06-10 DIAGNOSIS — M6281 Muscle weakness (generalized): Secondary | ICD-10-CM | POA: Diagnosis not present

## 2020-06-10 DIAGNOSIS — Z87891 Personal history of nicotine dependence: Secondary | ICD-10-CM | POA: Diagnosis not present

## 2020-06-10 DIAGNOSIS — I119 Hypertensive heart disease without heart failure: Secondary | ICD-10-CM | POA: Diagnosis not present

## 2020-06-10 DIAGNOSIS — I5032 Chronic diastolic (congestive) heart failure: Secondary | ICD-10-CM | POA: Diagnosis not present

## 2020-06-11 DIAGNOSIS — U071 COVID-19: Secondary | ICD-10-CM | POA: Diagnosis not present

## 2020-06-15 DIAGNOSIS — I119 Hypertensive heart disease without heart failure: Secondary | ICD-10-CM | POA: Diagnosis not present

## 2020-06-15 DIAGNOSIS — G300 Alzheimer's disease with early onset: Secondary | ICD-10-CM | POA: Diagnosis not present

## 2020-06-15 DIAGNOSIS — I4891 Unspecified atrial fibrillation: Secondary | ICD-10-CM | POA: Diagnosis not present

## 2020-06-15 DIAGNOSIS — M6281 Muscle weakness (generalized): Secondary | ICD-10-CM | POA: Diagnosis not present

## 2020-06-15 DIAGNOSIS — M545 Low back pain, unspecified: Secondary | ICD-10-CM | POA: Diagnosis not present

## 2020-06-15 DIAGNOSIS — Z87891 Personal history of nicotine dependence: Secondary | ICD-10-CM | POA: Diagnosis not present

## 2020-06-15 DIAGNOSIS — I5032 Chronic diastolic (congestive) heart failure: Secondary | ICD-10-CM | POA: Diagnosis not present

## 2020-06-15 DIAGNOSIS — J449 Chronic obstructive pulmonary disease, unspecified: Secondary | ICD-10-CM | POA: Diagnosis not present

## 2020-06-16 DIAGNOSIS — U071 COVID-19: Secondary | ICD-10-CM | POA: Diagnosis not present

## 2020-06-18 DIAGNOSIS — M6281 Muscle weakness (generalized): Secondary | ICD-10-CM | POA: Diagnosis not present

## 2020-06-18 DIAGNOSIS — I4891 Unspecified atrial fibrillation: Secondary | ICD-10-CM | POA: Diagnosis not present

## 2020-06-18 DIAGNOSIS — U071 COVID-19: Secondary | ICD-10-CM | POA: Diagnosis not present

## 2020-06-18 DIAGNOSIS — I119 Hypertensive heart disease without heart failure: Secondary | ICD-10-CM | POA: Diagnosis not present

## 2020-06-18 DIAGNOSIS — M545 Low back pain, unspecified: Secondary | ICD-10-CM | POA: Diagnosis not present

## 2020-06-22 DIAGNOSIS — M545 Low back pain, unspecified: Secondary | ICD-10-CM | POA: Diagnosis not present

## 2020-06-22 DIAGNOSIS — J449 Chronic obstructive pulmonary disease, unspecified: Secondary | ICD-10-CM | POA: Diagnosis not present

## 2020-06-22 DIAGNOSIS — G300 Alzheimer's disease with early onset: Secondary | ICD-10-CM | POA: Diagnosis not present

## 2020-06-22 DIAGNOSIS — I5032 Chronic diastolic (congestive) heart failure: Secondary | ICD-10-CM | POA: Diagnosis not present

## 2020-06-22 DIAGNOSIS — Z87891 Personal history of nicotine dependence: Secondary | ICD-10-CM | POA: Diagnosis not present

## 2020-06-22 DIAGNOSIS — I4891 Unspecified atrial fibrillation: Secondary | ICD-10-CM | POA: Diagnosis not present

## 2020-06-22 DIAGNOSIS — M6281 Muscle weakness (generalized): Secondary | ICD-10-CM | POA: Diagnosis not present

## 2020-06-22 DIAGNOSIS — I119 Hypertensive heart disease without heart failure: Secondary | ICD-10-CM | POA: Diagnosis not present

## 2020-06-23 DIAGNOSIS — U071 COVID-19: Secondary | ICD-10-CM | POA: Diagnosis not present

## 2020-06-24 DIAGNOSIS — J449 Chronic obstructive pulmonary disease, unspecified: Secondary | ICD-10-CM | POA: Diagnosis not present

## 2020-06-24 DIAGNOSIS — Z87891 Personal history of nicotine dependence: Secondary | ICD-10-CM | POA: Diagnosis not present

## 2020-06-24 DIAGNOSIS — I5032 Chronic diastolic (congestive) heart failure: Secondary | ICD-10-CM | POA: Diagnosis not present

## 2020-06-24 DIAGNOSIS — M6281 Muscle weakness (generalized): Secondary | ICD-10-CM | POA: Diagnosis not present

## 2020-06-24 DIAGNOSIS — I4891 Unspecified atrial fibrillation: Secondary | ICD-10-CM | POA: Diagnosis not present

## 2020-06-24 DIAGNOSIS — M545 Low back pain, unspecified: Secondary | ICD-10-CM | POA: Diagnosis not present

## 2020-06-24 DIAGNOSIS — G300 Alzheimer's disease with early onset: Secondary | ICD-10-CM | POA: Diagnosis not present

## 2020-06-24 DIAGNOSIS — I119 Hypertensive heart disease without heart failure: Secondary | ICD-10-CM | POA: Diagnosis not present

## 2020-06-25 DIAGNOSIS — U071 COVID-19: Secondary | ICD-10-CM | POA: Diagnosis not present

## 2020-06-25 DIAGNOSIS — Z23 Encounter for immunization: Secondary | ICD-10-CM | POA: Diagnosis not present

## 2020-06-30 DIAGNOSIS — U071 COVID-19: Secondary | ICD-10-CM | POA: Diagnosis not present

## 2020-07-01 DIAGNOSIS — I119 Hypertensive heart disease without heart failure: Secondary | ICD-10-CM | POA: Diagnosis not present

## 2020-07-01 DIAGNOSIS — I5032 Chronic diastolic (congestive) heart failure: Secondary | ICD-10-CM | POA: Diagnosis not present

## 2020-07-01 DIAGNOSIS — I4891 Unspecified atrial fibrillation: Secondary | ICD-10-CM | POA: Diagnosis not present

## 2020-07-01 DIAGNOSIS — M545 Low back pain, unspecified: Secondary | ICD-10-CM | POA: Diagnosis not present

## 2020-07-01 DIAGNOSIS — J449 Chronic obstructive pulmonary disease, unspecified: Secondary | ICD-10-CM | POA: Diagnosis not present

## 2020-07-01 DIAGNOSIS — M6281 Muscle weakness (generalized): Secondary | ICD-10-CM | POA: Diagnosis not present

## 2020-07-01 DIAGNOSIS — Z87891 Personal history of nicotine dependence: Secondary | ICD-10-CM | POA: Diagnosis not present

## 2020-07-01 DIAGNOSIS — G300 Alzheimer's disease with early onset: Secondary | ICD-10-CM | POA: Diagnosis not present

## 2020-07-02 DIAGNOSIS — U071 COVID-19: Secondary | ICD-10-CM | POA: Diagnosis not present

## 2020-07-03 DIAGNOSIS — J449 Chronic obstructive pulmonary disease, unspecified: Secondary | ICD-10-CM | POA: Diagnosis not present

## 2020-07-03 DIAGNOSIS — E7849 Other hyperlipidemia: Secondary | ICD-10-CM | POA: Diagnosis not present

## 2020-07-03 DIAGNOSIS — I10 Essential (primary) hypertension: Secondary | ICD-10-CM | POA: Diagnosis not present

## 2020-07-06 DIAGNOSIS — I4891 Unspecified atrial fibrillation: Secondary | ICD-10-CM | POA: Diagnosis not present

## 2020-07-06 DIAGNOSIS — Z87891 Personal history of nicotine dependence: Secondary | ICD-10-CM | POA: Diagnosis not present

## 2020-07-06 DIAGNOSIS — I119 Hypertensive heart disease without heart failure: Secondary | ICD-10-CM | POA: Diagnosis not present

## 2020-07-06 DIAGNOSIS — Z299 Encounter for prophylactic measures, unspecified: Secondary | ICD-10-CM | POA: Diagnosis not present

## 2020-07-06 DIAGNOSIS — F17210 Nicotine dependence, cigarettes, uncomplicated: Secondary | ICD-10-CM | POA: Diagnosis not present

## 2020-07-06 DIAGNOSIS — M545 Low back pain, unspecified: Secondary | ICD-10-CM | POA: Diagnosis not present

## 2020-07-06 DIAGNOSIS — I5032 Chronic diastolic (congestive) heart failure: Secondary | ICD-10-CM | POA: Diagnosis not present

## 2020-07-06 DIAGNOSIS — J449 Chronic obstructive pulmonary disease, unspecified: Secondary | ICD-10-CM | POA: Diagnosis not present

## 2020-07-06 DIAGNOSIS — R300 Dysuria: Secondary | ICD-10-CM | POA: Diagnosis not present

## 2020-07-06 DIAGNOSIS — M6281 Muscle weakness (generalized): Secondary | ICD-10-CM | POA: Diagnosis not present

## 2020-07-06 DIAGNOSIS — G300 Alzheimer's disease with early onset: Secondary | ICD-10-CM | POA: Diagnosis not present

## 2020-07-07 DIAGNOSIS — U071 COVID-19: Secondary | ICD-10-CM | POA: Diagnosis not present

## 2020-07-08 DIAGNOSIS — I119 Hypertensive heart disease without heart failure: Secondary | ICD-10-CM | POA: Diagnosis not present

## 2020-07-08 DIAGNOSIS — M6281 Muscle weakness (generalized): Secondary | ICD-10-CM | POA: Diagnosis not present

## 2020-07-08 DIAGNOSIS — I5032 Chronic diastolic (congestive) heart failure: Secondary | ICD-10-CM | POA: Diagnosis not present

## 2020-07-08 DIAGNOSIS — M545 Low back pain, unspecified: Secondary | ICD-10-CM | POA: Diagnosis not present

## 2020-07-08 DIAGNOSIS — Z87891 Personal history of nicotine dependence: Secondary | ICD-10-CM | POA: Diagnosis not present

## 2020-07-08 DIAGNOSIS — I4891 Unspecified atrial fibrillation: Secondary | ICD-10-CM | POA: Diagnosis not present

## 2020-07-08 DIAGNOSIS — J449 Chronic obstructive pulmonary disease, unspecified: Secondary | ICD-10-CM | POA: Diagnosis not present

## 2020-07-08 DIAGNOSIS — G300 Alzheimer's disease with early onset: Secondary | ICD-10-CM | POA: Diagnosis not present

## 2020-07-09 DIAGNOSIS — U071 COVID-19: Secondary | ICD-10-CM | POA: Diagnosis not present

## 2020-07-14 DIAGNOSIS — U071 COVID-19: Secondary | ICD-10-CM | POA: Diagnosis not present

## 2020-07-16 DIAGNOSIS — Z87891 Personal history of nicotine dependence: Secondary | ICD-10-CM | POA: Diagnosis not present

## 2020-07-16 DIAGNOSIS — M545 Low back pain, unspecified: Secondary | ICD-10-CM | POA: Diagnosis not present

## 2020-07-16 DIAGNOSIS — M6281 Muscle weakness (generalized): Secondary | ICD-10-CM | POA: Diagnosis not present

## 2020-07-16 DIAGNOSIS — G300 Alzheimer's disease with early onset: Secondary | ICD-10-CM | POA: Diagnosis not present

## 2020-07-16 DIAGNOSIS — I4891 Unspecified atrial fibrillation: Secondary | ICD-10-CM | POA: Diagnosis not present

## 2020-07-16 DIAGNOSIS — J449 Chronic obstructive pulmonary disease, unspecified: Secondary | ICD-10-CM | POA: Diagnosis not present

## 2020-07-16 DIAGNOSIS — I5032 Chronic diastolic (congestive) heart failure: Secondary | ICD-10-CM | POA: Diagnosis not present

## 2020-07-16 DIAGNOSIS — I119 Hypertensive heart disease without heart failure: Secondary | ICD-10-CM | POA: Diagnosis not present

## 2020-07-16 DIAGNOSIS — U071 COVID-19: Secondary | ICD-10-CM | POA: Diagnosis not present

## 2020-07-21 DIAGNOSIS — U071 COVID-19: Secondary | ICD-10-CM | POA: Diagnosis not present

## 2020-07-22 DIAGNOSIS — J449 Chronic obstructive pulmonary disease, unspecified: Secondary | ICD-10-CM | POA: Diagnosis not present

## 2020-07-23 DIAGNOSIS — U071 COVID-19: Secondary | ICD-10-CM | POA: Diagnosis not present

## 2020-07-24 DIAGNOSIS — J449 Chronic obstructive pulmonary disease, unspecified: Secondary | ICD-10-CM | POA: Diagnosis not present

## 2020-07-28 DIAGNOSIS — U071 COVID-19: Secondary | ICD-10-CM | POA: Diagnosis not present

## 2020-07-28 DIAGNOSIS — J449 Chronic obstructive pulmonary disease, unspecified: Secondary | ICD-10-CM | POA: Diagnosis not present

## 2020-07-29 DIAGNOSIS — J449 Chronic obstructive pulmonary disease, unspecified: Secondary | ICD-10-CM | POA: Diagnosis not present

## 2020-08-04 DIAGNOSIS — M170 Bilateral primary osteoarthritis of knee: Secondary | ICD-10-CM | POA: Diagnosis not present

## 2020-08-04 DIAGNOSIS — I10 Essential (primary) hypertension: Secondary | ICD-10-CM | POA: Diagnosis not present

## 2020-08-04 DIAGNOSIS — U071 COVID-19: Secondary | ICD-10-CM | POA: Diagnosis not present

## 2020-08-04 DIAGNOSIS — J449 Chronic obstructive pulmonary disease, unspecified: Secondary | ICD-10-CM | POA: Diagnosis not present

## 2020-08-06 DIAGNOSIS — U071 COVID-19: Secondary | ICD-10-CM | POA: Diagnosis not present

## 2020-08-06 DIAGNOSIS — J449 Chronic obstructive pulmonary disease, unspecified: Secondary | ICD-10-CM | POA: Diagnosis not present

## 2020-08-11 DIAGNOSIS — J449 Chronic obstructive pulmonary disease, unspecified: Secondary | ICD-10-CM | POA: Diagnosis not present

## 2020-08-11 DIAGNOSIS — U071 COVID-19: Secondary | ICD-10-CM | POA: Diagnosis not present

## 2020-08-13 DIAGNOSIS — J449 Chronic obstructive pulmonary disease, unspecified: Secondary | ICD-10-CM | POA: Diagnosis not present

## 2020-08-13 DIAGNOSIS — U071 COVID-19: Secondary | ICD-10-CM | POA: Diagnosis not present

## 2020-08-17 DIAGNOSIS — J449 Chronic obstructive pulmonary disease, unspecified: Secondary | ICD-10-CM | POA: Diagnosis not present

## 2020-08-18 DIAGNOSIS — U071 COVID-19: Secondary | ICD-10-CM | POA: Diagnosis not present

## 2020-08-20 DIAGNOSIS — J449 Chronic obstructive pulmonary disease, unspecified: Secondary | ICD-10-CM | POA: Diagnosis not present

## 2020-08-20 DIAGNOSIS — U071 COVID-19: Secondary | ICD-10-CM | POA: Diagnosis not present

## 2020-08-24 DIAGNOSIS — J449 Chronic obstructive pulmonary disease, unspecified: Secondary | ICD-10-CM | POA: Diagnosis not present

## 2020-08-25 DIAGNOSIS — U071 COVID-19: Secondary | ICD-10-CM | POA: Diagnosis not present

## 2020-08-25 DIAGNOSIS — J449 Chronic obstructive pulmonary disease, unspecified: Secondary | ICD-10-CM | POA: Diagnosis not present

## 2020-08-27 DIAGNOSIS — R350 Frequency of micturition: Secondary | ICD-10-CM | POA: Diagnosis not present

## 2020-08-27 DIAGNOSIS — Z299 Encounter for prophylactic measures, unspecified: Secondary | ICD-10-CM | POA: Diagnosis not present

## 2020-08-27 DIAGNOSIS — I10 Essential (primary) hypertension: Secondary | ICD-10-CM | POA: Diagnosis not present

## 2020-08-27 DIAGNOSIS — Z7689 Persons encountering health services in other specified circumstances: Secondary | ICD-10-CM | POA: Diagnosis not present

## 2020-08-27 DIAGNOSIS — N390 Urinary tract infection, site not specified: Secondary | ICD-10-CM | POA: Diagnosis not present

## 2020-08-27 DIAGNOSIS — J449 Chronic obstructive pulmonary disease, unspecified: Secondary | ICD-10-CM | POA: Diagnosis not present

## 2020-08-27 DIAGNOSIS — U071 COVID-19: Secondary | ICD-10-CM | POA: Diagnosis not present

## 2020-08-27 DIAGNOSIS — F17210 Nicotine dependence, cigarettes, uncomplicated: Secondary | ICD-10-CM | POA: Diagnosis not present

## 2020-09-01 DIAGNOSIS — U071 COVID-19: Secondary | ICD-10-CM | POA: Diagnosis not present

## 2020-09-03 DIAGNOSIS — U071 COVID-19: Secondary | ICD-10-CM | POA: Diagnosis not present

## 2020-09-08 DIAGNOSIS — U071 COVID-19: Secondary | ICD-10-CM | POA: Diagnosis not present

## 2020-09-09 DIAGNOSIS — J449 Chronic obstructive pulmonary disease, unspecified: Secondary | ICD-10-CM | POA: Diagnosis not present

## 2020-09-10 DIAGNOSIS — U071 COVID-19: Secondary | ICD-10-CM | POA: Diagnosis not present

## 2020-10-05 DIAGNOSIS — M170 Bilateral primary osteoarthritis of knee: Secondary | ICD-10-CM | POA: Diagnosis not present

## 2020-10-05 DIAGNOSIS — I10 Essential (primary) hypertension: Secondary | ICD-10-CM | POA: Diagnosis not present

## 2020-10-08 DIAGNOSIS — R350 Frequency of micturition: Secondary | ICD-10-CM | POA: Diagnosis not present

## 2020-10-08 DIAGNOSIS — J449 Chronic obstructive pulmonary disease, unspecified: Secondary | ICD-10-CM | POA: Diagnosis not present

## 2020-10-08 DIAGNOSIS — N390 Urinary tract infection, site not specified: Secondary | ICD-10-CM | POA: Diagnosis not present

## 2020-10-08 DIAGNOSIS — Z299 Encounter for prophylactic measures, unspecified: Secondary | ICD-10-CM | POA: Diagnosis not present

## 2020-10-08 DIAGNOSIS — I10 Essential (primary) hypertension: Secondary | ICD-10-CM | POA: Diagnosis not present

## 2020-11-02 DIAGNOSIS — Z720 Tobacco use: Secondary | ICD-10-CM | POA: Diagnosis not present

## 2020-11-02 DIAGNOSIS — J449 Chronic obstructive pulmonary disease, unspecified: Secondary | ICD-10-CM | POA: Diagnosis not present

## 2020-11-02 DIAGNOSIS — E785 Hyperlipidemia, unspecified: Secondary | ICD-10-CM | POA: Diagnosis not present

## 2020-11-02 DIAGNOSIS — I10 Essential (primary) hypertension: Secondary | ICD-10-CM | POA: Diagnosis not present

## 2020-11-12 DIAGNOSIS — I10 Essential (primary) hypertension: Secondary | ICD-10-CM | POA: Diagnosis not present

## 2020-11-12 DIAGNOSIS — Z299 Encounter for prophylactic measures, unspecified: Secondary | ICD-10-CM | POA: Diagnosis not present

## 2020-11-12 DIAGNOSIS — J449 Chronic obstructive pulmonary disease, unspecified: Secondary | ICD-10-CM | POA: Diagnosis not present

## 2020-11-12 DIAGNOSIS — Z7689 Persons encountering health services in other specified circumstances: Secondary | ICD-10-CM | POA: Diagnosis not present

## 2020-12-03 DIAGNOSIS — U071 COVID-19: Secondary | ICD-10-CM | POA: Diagnosis not present

## 2020-12-08 DIAGNOSIS — U071 COVID-19: Secondary | ICD-10-CM | POA: Diagnosis not present

## 2020-12-15 DIAGNOSIS — U071 COVID-19: Secondary | ICD-10-CM | POA: Diagnosis not present

## 2020-12-22 DIAGNOSIS — U071 COVID-19: Secondary | ICD-10-CM | POA: Diagnosis not present

## 2020-12-29 DIAGNOSIS — U071 COVID-19: Secondary | ICD-10-CM | POA: Diagnosis not present

## 2021-01-05 DIAGNOSIS — U071 COVID-19: Secondary | ICD-10-CM | POA: Diagnosis not present

## 2021-01-12 DIAGNOSIS — U071 COVID-19: Secondary | ICD-10-CM | POA: Diagnosis not present

## 2021-01-18 DIAGNOSIS — Z7689 Persons encountering health services in other specified circumstances: Secondary | ICD-10-CM | POA: Diagnosis not present

## 2021-01-19 DIAGNOSIS — U071 COVID-19: Secondary | ICD-10-CM | POA: Diagnosis not present

## 2021-01-26 DIAGNOSIS — U071 COVID-19: Secondary | ICD-10-CM | POA: Diagnosis not present

## 2021-02-01 DIAGNOSIS — I10 Essential (primary) hypertension: Secondary | ICD-10-CM | POA: Diagnosis not present

## 2021-02-01 DIAGNOSIS — E785 Hyperlipidemia, unspecified: Secondary | ICD-10-CM | POA: Diagnosis not present

## 2021-02-01 DIAGNOSIS — J449 Chronic obstructive pulmonary disease, unspecified: Secondary | ICD-10-CM | POA: Diagnosis not present

## 2021-02-01 DIAGNOSIS — Z720 Tobacco use: Secondary | ICD-10-CM | POA: Diagnosis not present

## 2021-02-03 DIAGNOSIS — U071 COVID-19: Secondary | ICD-10-CM | POA: Diagnosis not present

## 2021-02-09 DIAGNOSIS — U071 COVID-19: Secondary | ICD-10-CM | POA: Diagnosis not present

## 2021-02-16 DIAGNOSIS — U071 COVID-19: Secondary | ICD-10-CM | POA: Diagnosis not present

## 2021-02-23 DIAGNOSIS — U071 COVID-19: Secondary | ICD-10-CM | POA: Diagnosis not present

## 2021-03-02 DIAGNOSIS — U071 COVID-19: Secondary | ICD-10-CM | POA: Diagnosis not present

## 2021-03-09 DIAGNOSIS — U071 COVID-19: Secondary | ICD-10-CM | POA: Diagnosis not present

## 2021-03-15 DIAGNOSIS — I5030 Unspecified diastolic (congestive) heart failure: Secondary | ICD-10-CM | POA: Diagnosis not present

## 2021-03-15 DIAGNOSIS — Z299 Encounter for prophylactic measures, unspecified: Secondary | ICD-10-CM | POA: Diagnosis not present

## 2021-03-15 DIAGNOSIS — J449 Chronic obstructive pulmonary disease, unspecified: Secondary | ICD-10-CM | POA: Diagnosis not present

## 2021-03-15 DIAGNOSIS — I10 Essential (primary) hypertension: Secondary | ICD-10-CM | POA: Diagnosis not present

## 2021-03-15 DIAGNOSIS — Z7689 Persons encountering health services in other specified circumstances: Secondary | ICD-10-CM | POA: Diagnosis not present

## 2021-03-15 DIAGNOSIS — I700 Atherosclerosis of aorta: Secondary | ICD-10-CM | POA: Diagnosis not present

## 2021-03-16 DIAGNOSIS — U071 COVID-19: Secondary | ICD-10-CM | POA: Diagnosis not present

## 2021-03-23 DIAGNOSIS — U071 COVID-19: Secondary | ICD-10-CM | POA: Diagnosis not present

## 2021-03-29 DIAGNOSIS — H5213 Myopia, bilateral: Secondary | ICD-10-CM | POA: Diagnosis not present

## 2021-03-30 DIAGNOSIS — U071 COVID-19: Secondary | ICD-10-CM | POA: Diagnosis not present

## 2021-04-06 DIAGNOSIS — U071 COVID-19: Secondary | ICD-10-CM | POA: Diagnosis not present

## 2021-04-13 DIAGNOSIS — U071 COVID-19: Secondary | ICD-10-CM | POA: Diagnosis not present

## 2021-04-19 DIAGNOSIS — Z299 Encounter for prophylactic measures, unspecified: Secondary | ICD-10-CM | POA: Diagnosis not present

## 2021-04-19 DIAGNOSIS — J449 Chronic obstructive pulmonary disease, unspecified: Secondary | ICD-10-CM | POA: Diagnosis not present

## 2021-04-19 DIAGNOSIS — I10 Essential (primary) hypertension: Secondary | ICD-10-CM | POA: Diagnosis not present

## 2021-04-19 DIAGNOSIS — J441 Chronic obstructive pulmonary disease with (acute) exacerbation: Secondary | ICD-10-CM | POA: Diagnosis not present

## 2021-04-19 DIAGNOSIS — J45901 Unspecified asthma with (acute) exacerbation: Secondary | ICD-10-CM | POA: Diagnosis not present

## 2021-04-20 DIAGNOSIS — U071 COVID-19: Secondary | ICD-10-CM | POA: Diagnosis not present

## 2021-04-28 DIAGNOSIS — U071 COVID-19: Secondary | ICD-10-CM | POA: Diagnosis not present

## 2021-04-29 DIAGNOSIS — H524 Presbyopia: Secondary | ICD-10-CM | POA: Diagnosis not present

## 2021-04-29 DIAGNOSIS — H52223 Regular astigmatism, bilateral: Secondary | ICD-10-CM | POA: Diagnosis not present

## 2021-05-03 DIAGNOSIS — U071 COVID-19: Secondary | ICD-10-CM | POA: Diagnosis not present

## 2021-05-05 DIAGNOSIS — Z720 Tobacco use: Secondary | ICD-10-CM | POA: Diagnosis not present

## 2021-05-05 DIAGNOSIS — I10 Essential (primary) hypertension: Secondary | ICD-10-CM | POA: Diagnosis not present

## 2021-05-05 DIAGNOSIS — J449 Chronic obstructive pulmonary disease, unspecified: Secondary | ICD-10-CM | POA: Diagnosis not present

## 2021-05-05 DIAGNOSIS — E785 Hyperlipidemia, unspecified: Secondary | ICD-10-CM | POA: Diagnosis not present

## 2021-06-02 DIAGNOSIS — I10 Essential (primary) hypertension: Secondary | ICD-10-CM | POA: Diagnosis not present

## 2021-06-02 DIAGNOSIS — R5383 Other fatigue: Secondary | ICD-10-CM | POA: Diagnosis not present

## 2021-06-02 DIAGNOSIS — Z299 Encounter for prophylactic measures, unspecified: Secondary | ICD-10-CM | POA: Diagnosis not present

## 2021-06-02 DIAGNOSIS — Z7189 Other specified counseling: Secondary | ICD-10-CM | POA: Diagnosis not present

## 2021-06-02 DIAGNOSIS — Z79899 Other long term (current) drug therapy: Secondary | ICD-10-CM | POA: Diagnosis not present

## 2021-06-02 DIAGNOSIS — E7800 Pure hypercholesterolemia, unspecified: Secondary | ICD-10-CM | POA: Diagnosis not present

## 2021-06-02 DIAGNOSIS — Z0000 Encounter for general adult medical examination without abnormal findings: Secondary | ICD-10-CM | POA: Diagnosis not present

## 2021-06-02 DIAGNOSIS — I5030 Unspecified diastolic (congestive) heart failure: Secondary | ICD-10-CM | POA: Diagnosis not present

## 2021-06-04 DIAGNOSIS — R002 Palpitations: Secondary | ICD-10-CM | POA: Diagnosis not present

## 2021-06-16 DIAGNOSIS — Z23 Encounter for immunization: Secondary | ICD-10-CM | POA: Diagnosis not present

## 2021-06-25 IMAGING — US US ABDOMEN LIMITED
1 series · 14 of 25 positions shown · non-contrast
Comparison: CT scan 12/09/2019

CLINICAL DATA: Elevated liver function studies.

EXAM:
ULTRASOUND ABDOMEN LIMITED RIGHT UPPER QUADRANT

[Series 1: us abdomen limited ruq · 14 of 56 slices shown]
[im 1/56]
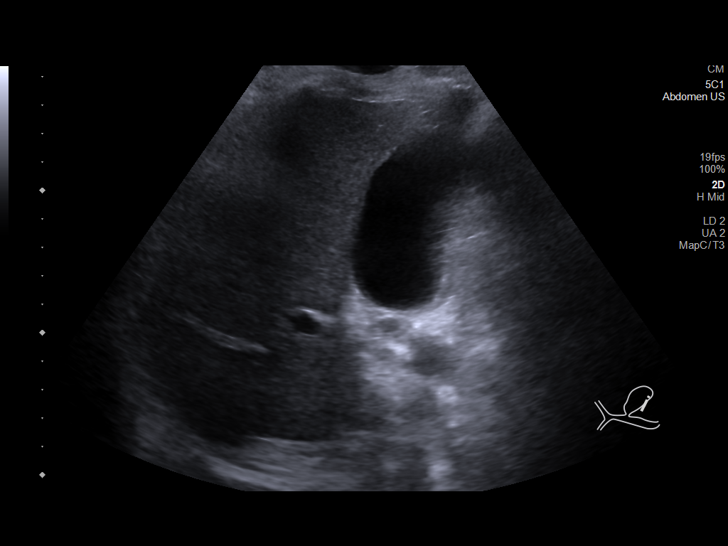
[im 5/56]
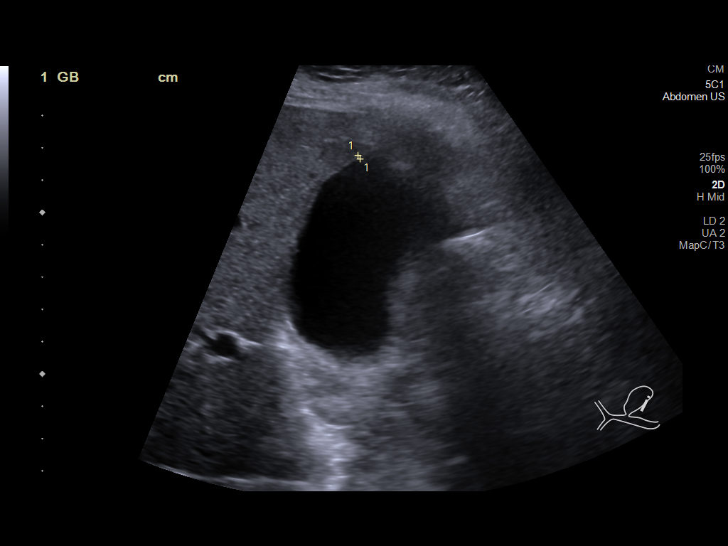
[im 10/56]
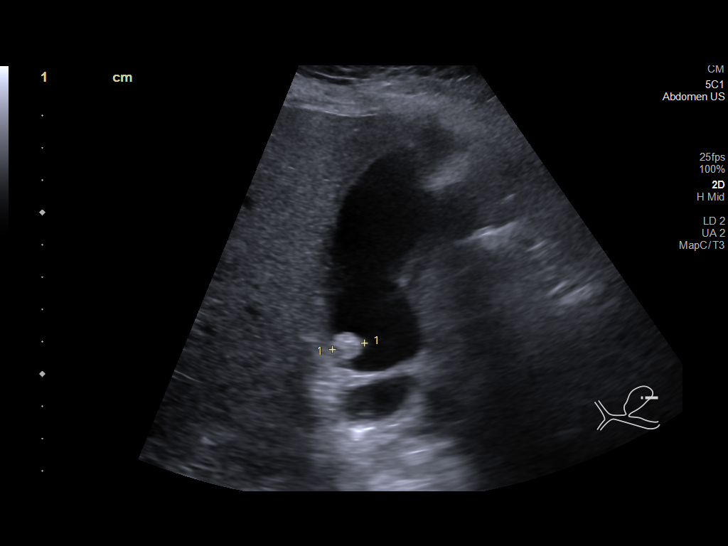
[im 14/56]
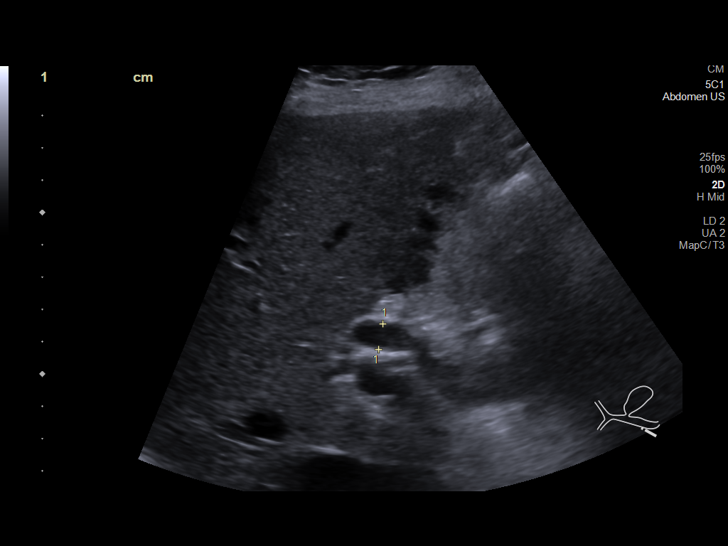
[im 19/56]
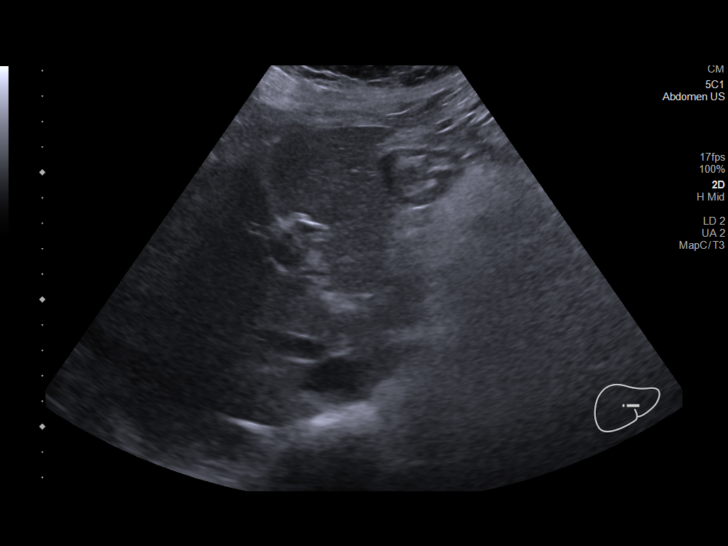
[im 21/56]
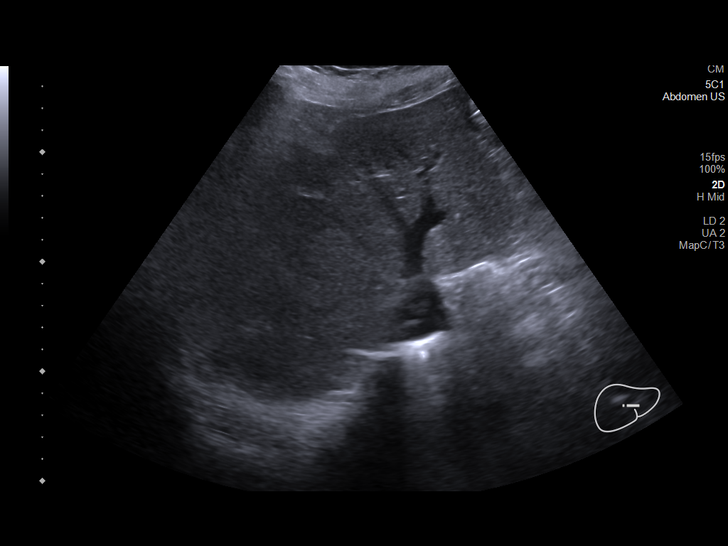
[im 26/56]
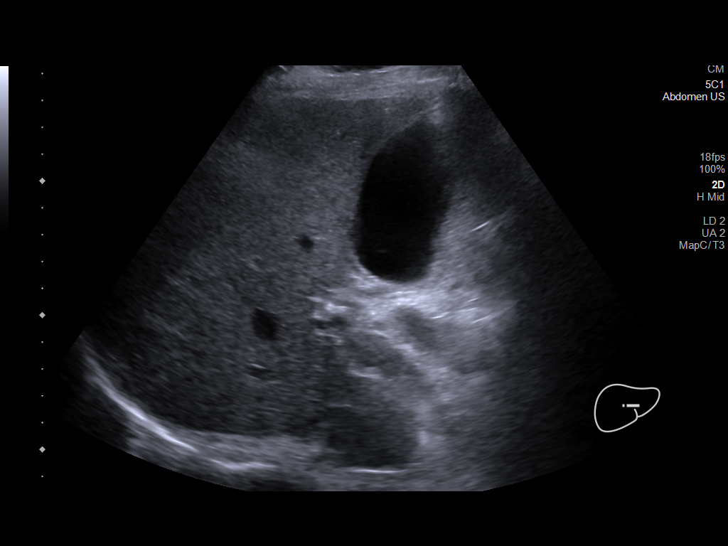
[im 30/56]
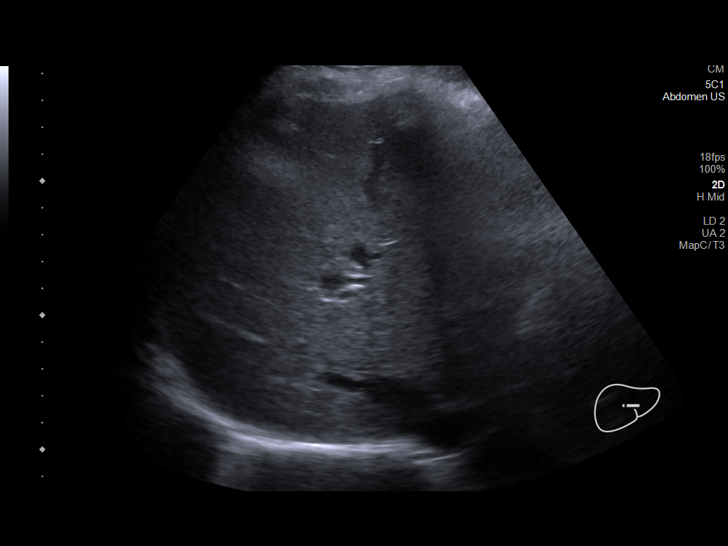
[im 35/56]
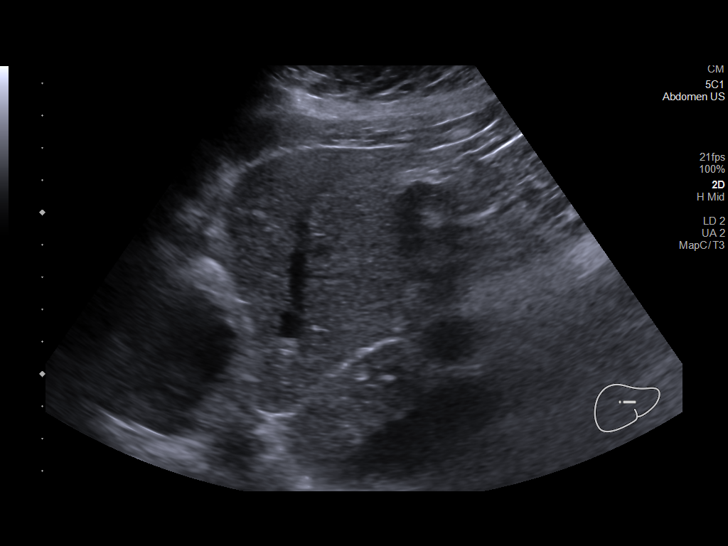
[im 37/56]
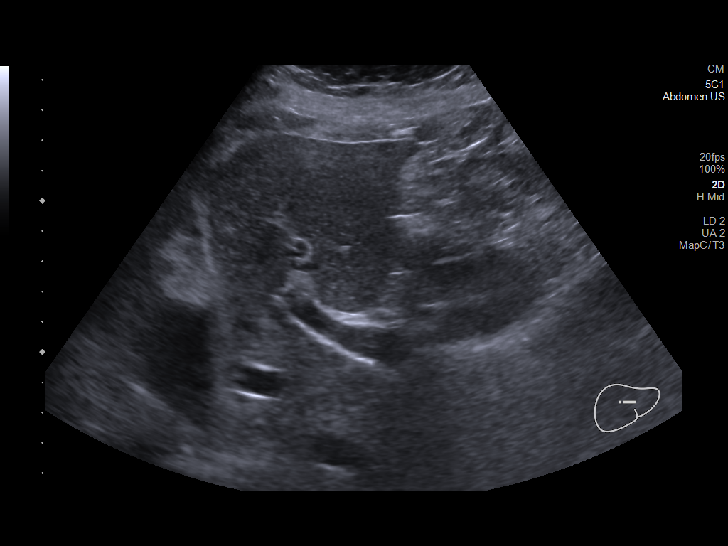
[im 42/56]
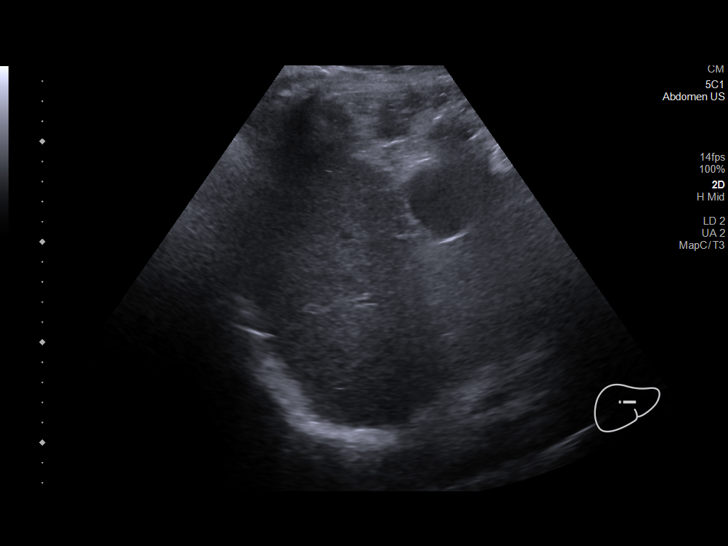
[im 46/56]
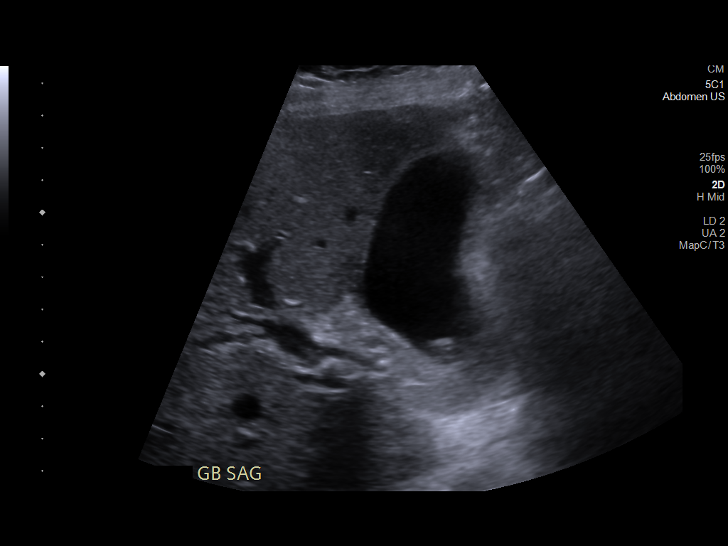
[im 51/56]
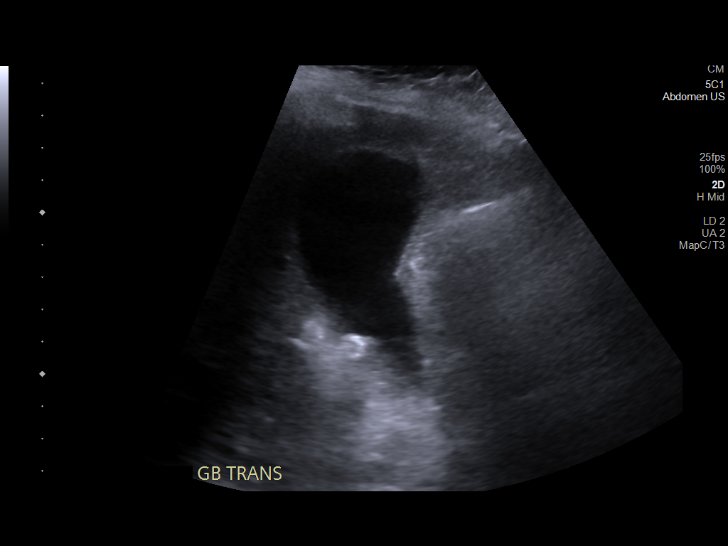
[im 56/56]
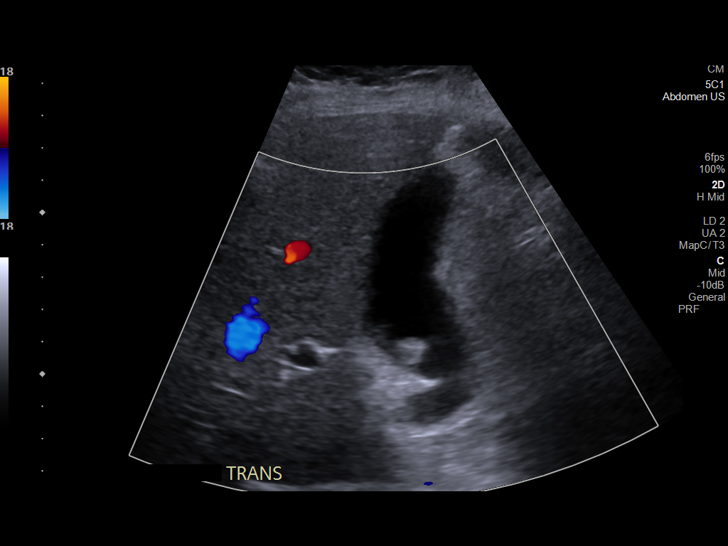

[14 of 25 positions shown; findings below may reference images not displayed]

FINDINGS: Gallbladder:

Single 10 mm gallstone noted the gallbladder corresponding to the
prior CT scan. No gallbladder wall thickening, pericholecystic fluid
or sonographic Murphy sign to suggest acute cholecystitis.

Common bile duct:

Diameter: Stable common bile duct dilatation measuring up to 10 mm.
No change since prior CT scan. No obvious common bile duct stones.

Liver:

Normal echogenicity without focal lesion or biliary dilatation.
Portal vein is patent on color Doppler imaging with normal direction
of blood flow towards the liver.

Other: None.
IMPRESSION: 1. Cholelithiasis but no findings for acute cholecystitis.
2. Stable appearing common bile duct dilatation.
3. No hepatic lesions.

## 2021-10-07 DIAGNOSIS — W19XXXA Unspecified fall, initial encounter: Secondary | ICD-10-CM | POA: Diagnosis not present

## 2021-10-07 DIAGNOSIS — R5381 Other malaise: Secondary | ICD-10-CM | POA: Diagnosis not present

## 2021-10-07 DIAGNOSIS — R531 Weakness: Secondary | ICD-10-CM | POA: Diagnosis not present

## 2021-11-01 DIAGNOSIS — H25813 Combined forms of age-related cataract, bilateral: Secondary | ICD-10-CM | POA: Diagnosis not present

## 2021-11-30 DIAGNOSIS — I10 Essential (primary) hypertension: Secondary | ICD-10-CM | POA: Diagnosis not present

## 2021-11-30 DIAGNOSIS — I700 Atherosclerosis of aorta: Secondary | ICD-10-CM | POA: Diagnosis not present

## 2021-11-30 DIAGNOSIS — M549 Dorsalgia, unspecified: Secondary | ICD-10-CM | POA: Diagnosis not present

## 2021-11-30 DIAGNOSIS — Z7689 Persons encountering health services in other specified circumstances: Secondary | ICD-10-CM | POA: Diagnosis not present

## 2021-11-30 DIAGNOSIS — Z789 Other specified health status: Secondary | ICD-10-CM | POA: Diagnosis not present

## 2021-11-30 DIAGNOSIS — J449 Chronic obstructive pulmonary disease, unspecified: Secondary | ICD-10-CM | POA: Diagnosis not present

## 2021-11-30 DIAGNOSIS — Z299 Encounter for prophylactic measures, unspecified: Secondary | ICD-10-CM | POA: Diagnosis not present

## 2021-11-30 DIAGNOSIS — R2681 Unsteadiness on feet: Secondary | ICD-10-CM | POA: Diagnosis not present

## 2021-12-07 DIAGNOSIS — I10 Essential (primary) hypertension: Secondary | ICD-10-CM | POA: Diagnosis not present

## 2021-12-07 DIAGNOSIS — R531 Weakness: Secondary | ICD-10-CM | POA: Diagnosis not present

## 2021-12-07 DIAGNOSIS — M549 Dorsalgia, unspecified: Secondary | ICD-10-CM | POA: Diagnosis not present

## 2021-12-07 DIAGNOSIS — J449 Chronic obstructive pulmonary disease, unspecified: Secondary | ICD-10-CM | POA: Diagnosis not present

## 2021-12-07 DIAGNOSIS — R2681 Unsteadiness on feet: Secondary | ICD-10-CM | POA: Diagnosis not present

## 2021-12-07 DIAGNOSIS — I700 Atherosclerosis of aorta: Secondary | ICD-10-CM | POA: Diagnosis not present

## 2021-12-07 DIAGNOSIS — I5030 Unspecified diastolic (congestive) heart failure: Secondary | ICD-10-CM | POA: Diagnosis not present

## 2021-12-07 DIAGNOSIS — M170 Bilateral primary osteoarthritis of knee: Secondary | ICD-10-CM | POA: Diagnosis not present

## 2021-12-22 DIAGNOSIS — R531 Weakness: Secondary | ICD-10-CM | POA: Diagnosis not present

## 2021-12-22 DIAGNOSIS — I5030 Unspecified diastolic (congestive) heart failure: Secondary | ICD-10-CM | POA: Diagnosis not present

## 2021-12-22 DIAGNOSIS — I700 Atherosclerosis of aorta: Secondary | ICD-10-CM | POA: Diagnosis not present

## 2021-12-22 DIAGNOSIS — R2681 Unsteadiness on feet: Secondary | ICD-10-CM | POA: Diagnosis not present

## 2021-12-22 DIAGNOSIS — I10 Essential (primary) hypertension: Secondary | ICD-10-CM | POA: Diagnosis not present

## 2021-12-22 DIAGNOSIS — M170 Bilateral primary osteoarthritis of knee: Secondary | ICD-10-CM | POA: Diagnosis not present

## 2021-12-22 DIAGNOSIS — J449 Chronic obstructive pulmonary disease, unspecified: Secondary | ICD-10-CM | POA: Diagnosis not present

## 2021-12-22 DIAGNOSIS — M549 Dorsalgia, unspecified: Secondary | ICD-10-CM | POA: Diagnosis not present

## 2021-12-23 DIAGNOSIS — M170 Bilateral primary osteoarthritis of knee: Secondary | ICD-10-CM | POA: Diagnosis not present

## 2021-12-23 DIAGNOSIS — I10 Essential (primary) hypertension: Secondary | ICD-10-CM | POA: Diagnosis not present

## 2021-12-23 DIAGNOSIS — M549 Dorsalgia, unspecified: Secondary | ICD-10-CM | POA: Diagnosis not present

## 2021-12-23 DIAGNOSIS — I5030 Unspecified diastolic (congestive) heart failure: Secondary | ICD-10-CM | POA: Diagnosis not present

## 2021-12-23 DIAGNOSIS — R531 Weakness: Secondary | ICD-10-CM | POA: Diagnosis not present

## 2021-12-23 DIAGNOSIS — I700 Atherosclerosis of aorta: Secondary | ICD-10-CM | POA: Diagnosis not present

## 2021-12-23 DIAGNOSIS — J449 Chronic obstructive pulmonary disease, unspecified: Secondary | ICD-10-CM | POA: Diagnosis not present

## 2021-12-23 DIAGNOSIS — R2681 Unsteadiness on feet: Secondary | ICD-10-CM | POA: Diagnosis not present

## 2021-12-27 DIAGNOSIS — M549 Dorsalgia, unspecified: Secondary | ICD-10-CM | POA: Diagnosis not present

## 2021-12-27 DIAGNOSIS — I700 Atherosclerosis of aorta: Secondary | ICD-10-CM | POA: Diagnosis not present

## 2021-12-27 DIAGNOSIS — I10 Essential (primary) hypertension: Secondary | ICD-10-CM | POA: Diagnosis not present

## 2021-12-27 DIAGNOSIS — R531 Weakness: Secondary | ICD-10-CM | POA: Diagnosis not present

## 2021-12-27 DIAGNOSIS — M170 Bilateral primary osteoarthritis of knee: Secondary | ICD-10-CM | POA: Diagnosis not present

## 2021-12-27 DIAGNOSIS — R2681 Unsteadiness on feet: Secondary | ICD-10-CM | POA: Diagnosis not present

## 2021-12-27 DIAGNOSIS — I5030 Unspecified diastolic (congestive) heart failure: Secondary | ICD-10-CM | POA: Diagnosis not present

## 2021-12-27 DIAGNOSIS — J449 Chronic obstructive pulmonary disease, unspecified: Secondary | ICD-10-CM | POA: Diagnosis not present

## 2021-12-29 DIAGNOSIS — M549 Dorsalgia, unspecified: Secondary | ICD-10-CM | POA: Diagnosis not present

## 2021-12-29 DIAGNOSIS — I700 Atherosclerosis of aorta: Secondary | ICD-10-CM | POA: Diagnosis not present

## 2021-12-29 DIAGNOSIS — R2681 Unsteadiness on feet: Secondary | ICD-10-CM | POA: Diagnosis not present

## 2021-12-29 DIAGNOSIS — M170 Bilateral primary osteoarthritis of knee: Secondary | ICD-10-CM | POA: Diagnosis not present

## 2021-12-29 DIAGNOSIS — I5030 Unspecified diastolic (congestive) heart failure: Secondary | ICD-10-CM | POA: Diagnosis not present

## 2021-12-29 DIAGNOSIS — J449 Chronic obstructive pulmonary disease, unspecified: Secondary | ICD-10-CM | POA: Diagnosis not present

## 2021-12-29 DIAGNOSIS — I10 Essential (primary) hypertension: Secondary | ICD-10-CM | POA: Diagnosis not present

## 2021-12-29 DIAGNOSIS — R531 Weakness: Secondary | ICD-10-CM | POA: Diagnosis not present

## 2022-01-03 DIAGNOSIS — R2681 Unsteadiness on feet: Secondary | ICD-10-CM | POA: Diagnosis not present

## 2022-01-03 DIAGNOSIS — I5030 Unspecified diastolic (congestive) heart failure: Secondary | ICD-10-CM | POA: Diagnosis not present

## 2022-01-03 DIAGNOSIS — I700 Atherosclerosis of aorta: Secondary | ICD-10-CM | POA: Diagnosis not present

## 2022-01-03 DIAGNOSIS — J449 Chronic obstructive pulmonary disease, unspecified: Secondary | ICD-10-CM | POA: Diagnosis not present

## 2022-01-03 DIAGNOSIS — M549 Dorsalgia, unspecified: Secondary | ICD-10-CM | POA: Diagnosis not present

## 2022-01-03 DIAGNOSIS — R531 Weakness: Secondary | ICD-10-CM | POA: Diagnosis not present

## 2022-01-03 DIAGNOSIS — M170 Bilateral primary osteoarthritis of knee: Secondary | ICD-10-CM | POA: Diagnosis not present

## 2022-01-03 DIAGNOSIS — I10 Essential (primary) hypertension: Secondary | ICD-10-CM | POA: Diagnosis not present

## 2022-01-04 DIAGNOSIS — J449 Chronic obstructive pulmonary disease, unspecified: Secondary | ICD-10-CM | POA: Diagnosis not present

## 2022-01-04 DIAGNOSIS — I10 Essential (primary) hypertension: Secondary | ICD-10-CM | POA: Diagnosis not present

## 2022-01-04 DIAGNOSIS — I5030 Unspecified diastolic (congestive) heart failure: Secondary | ICD-10-CM | POA: Diagnosis not present

## 2022-01-04 DIAGNOSIS — I700 Atherosclerosis of aorta: Secondary | ICD-10-CM | POA: Diagnosis not present

## 2022-01-04 DIAGNOSIS — R531 Weakness: Secondary | ICD-10-CM | POA: Diagnosis not present

## 2022-01-04 DIAGNOSIS — R2681 Unsteadiness on feet: Secondary | ICD-10-CM | POA: Diagnosis not present

## 2022-01-04 DIAGNOSIS — M170 Bilateral primary osteoarthritis of knee: Secondary | ICD-10-CM | POA: Diagnosis not present

## 2022-01-04 DIAGNOSIS — M549 Dorsalgia, unspecified: Secondary | ICD-10-CM | POA: Diagnosis not present

## 2022-01-11 DIAGNOSIS — R531 Weakness: Secondary | ICD-10-CM | POA: Diagnosis not present

## 2022-01-11 DIAGNOSIS — I5030 Unspecified diastolic (congestive) heart failure: Secondary | ICD-10-CM | POA: Diagnosis not present

## 2022-01-11 DIAGNOSIS — J449 Chronic obstructive pulmonary disease, unspecified: Secondary | ICD-10-CM | POA: Diagnosis not present

## 2022-01-11 DIAGNOSIS — I10 Essential (primary) hypertension: Secondary | ICD-10-CM | POA: Diagnosis not present

## 2022-01-11 DIAGNOSIS — R2681 Unsteadiness on feet: Secondary | ICD-10-CM | POA: Diagnosis not present

## 2022-01-11 DIAGNOSIS — I700 Atherosclerosis of aorta: Secondary | ICD-10-CM | POA: Diagnosis not present

## 2022-01-11 DIAGNOSIS — M170 Bilateral primary osteoarthritis of knee: Secondary | ICD-10-CM | POA: Diagnosis not present

## 2022-01-11 DIAGNOSIS — M549 Dorsalgia, unspecified: Secondary | ICD-10-CM | POA: Diagnosis not present

## 2022-01-12 DIAGNOSIS — I700 Atherosclerosis of aorta: Secondary | ICD-10-CM | POA: Diagnosis not present

## 2022-01-12 DIAGNOSIS — I10 Essential (primary) hypertension: Secondary | ICD-10-CM | POA: Diagnosis not present

## 2022-01-12 DIAGNOSIS — M170 Bilateral primary osteoarthritis of knee: Secondary | ICD-10-CM | POA: Diagnosis not present

## 2022-01-12 DIAGNOSIS — M549 Dorsalgia, unspecified: Secondary | ICD-10-CM | POA: Diagnosis not present

## 2022-01-12 DIAGNOSIS — R531 Weakness: Secondary | ICD-10-CM | POA: Diagnosis not present

## 2022-01-12 DIAGNOSIS — J449 Chronic obstructive pulmonary disease, unspecified: Secondary | ICD-10-CM | POA: Diagnosis not present

## 2022-01-12 DIAGNOSIS — R2681 Unsteadiness on feet: Secondary | ICD-10-CM | POA: Diagnosis not present

## 2022-01-12 DIAGNOSIS — I5030 Unspecified diastolic (congestive) heart failure: Secondary | ICD-10-CM | POA: Diagnosis not present

## 2022-01-19 DIAGNOSIS — J449 Chronic obstructive pulmonary disease, unspecified: Secondary | ICD-10-CM | POA: Diagnosis not present

## 2022-01-19 DIAGNOSIS — M170 Bilateral primary osteoarthritis of knee: Secondary | ICD-10-CM | POA: Diagnosis not present

## 2022-01-19 DIAGNOSIS — I5030 Unspecified diastolic (congestive) heart failure: Secondary | ICD-10-CM | POA: Diagnosis not present

## 2022-01-19 DIAGNOSIS — M549 Dorsalgia, unspecified: Secondary | ICD-10-CM | POA: Diagnosis not present

## 2022-01-19 DIAGNOSIS — I700 Atherosclerosis of aorta: Secondary | ICD-10-CM | POA: Diagnosis not present

## 2022-01-19 DIAGNOSIS — R2681 Unsteadiness on feet: Secondary | ICD-10-CM | POA: Diagnosis not present

## 2022-01-19 DIAGNOSIS — R531 Weakness: Secondary | ICD-10-CM | POA: Diagnosis not present

## 2022-01-19 DIAGNOSIS — I10 Essential (primary) hypertension: Secondary | ICD-10-CM | POA: Diagnosis not present

## 2022-01-20 DIAGNOSIS — J449 Chronic obstructive pulmonary disease, unspecified: Secondary | ICD-10-CM | POA: Diagnosis not present

## 2022-01-20 DIAGNOSIS — I700 Atherosclerosis of aorta: Secondary | ICD-10-CM | POA: Diagnosis not present

## 2022-01-20 DIAGNOSIS — I10 Essential (primary) hypertension: Secondary | ICD-10-CM | POA: Diagnosis not present

## 2022-01-20 DIAGNOSIS — M170 Bilateral primary osteoarthritis of knee: Secondary | ICD-10-CM | POA: Diagnosis not present

## 2022-01-20 DIAGNOSIS — I5030 Unspecified diastolic (congestive) heart failure: Secondary | ICD-10-CM | POA: Diagnosis not present

## 2022-01-20 DIAGNOSIS — R2681 Unsteadiness on feet: Secondary | ICD-10-CM | POA: Diagnosis not present

## 2022-01-20 DIAGNOSIS — M549 Dorsalgia, unspecified: Secondary | ICD-10-CM | POA: Diagnosis not present

## 2022-01-20 DIAGNOSIS — R531 Weakness: Secondary | ICD-10-CM | POA: Diagnosis not present

## 2022-01-25 DIAGNOSIS — M549 Dorsalgia, unspecified: Secondary | ICD-10-CM | POA: Diagnosis not present

## 2022-01-25 DIAGNOSIS — R531 Weakness: Secondary | ICD-10-CM | POA: Diagnosis not present

## 2022-01-25 DIAGNOSIS — I5030 Unspecified diastolic (congestive) heart failure: Secondary | ICD-10-CM | POA: Diagnosis not present

## 2022-01-25 DIAGNOSIS — J449 Chronic obstructive pulmonary disease, unspecified: Secondary | ICD-10-CM | POA: Diagnosis not present

## 2022-01-25 DIAGNOSIS — R2681 Unsteadiness on feet: Secondary | ICD-10-CM | POA: Diagnosis not present

## 2022-01-25 DIAGNOSIS — I10 Essential (primary) hypertension: Secondary | ICD-10-CM | POA: Diagnosis not present

## 2022-01-25 DIAGNOSIS — M170 Bilateral primary osteoarthritis of knee: Secondary | ICD-10-CM | POA: Diagnosis not present

## 2022-01-25 DIAGNOSIS — I700 Atherosclerosis of aorta: Secondary | ICD-10-CM | POA: Diagnosis not present

## 2022-01-27 DIAGNOSIS — M549 Dorsalgia, unspecified: Secondary | ICD-10-CM | POA: Diagnosis not present

## 2022-01-27 DIAGNOSIS — J449 Chronic obstructive pulmonary disease, unspecified: Secondary | ICD-10-CM | POA: Diagnosis not present

## 2022-01-27 DIAGNOSIS — R2681 Unsteadiness on feet: Secondary | ICD-10-CM | POA: Diagnosis not present

## 2022-01-27 DIAGNOSIS — R531 Weakness: Secondary | ICD-10-CM | POA: Diagnosis not present

## 2022-01-27 DIAGNOSIS — I10 Essential (primary) hypertension: Secondary | ICD-10-CM | POA: Diagnosis not present

## 2022-01-27 DIAGNOSIS — I5030 Unspecified diastolic (congestive) heart failure: Secondary | ICD-10-CM | POA: Diagnosis not present

## 2022-01-27 DIAGNOSIS — M170 Bilateral primary osteoarthritis of knee: Secondary | ICD-10-CM | POA: Diagnosis not present

## 2022-01-27 DIAGNOSIS — I700 Atherosclerosis of aorta: Secondary | ICD-10-CM | POA: Diagnosis not present

## 2022-02-02 DIAGNOSIS — Z0000 Encounter for general adult medical examination without abnormal findings: Secondary | ICD-10-CM | POA: Diagnosis not present

## 2022-02-02 DIAGNOSIS — Z713 Dietary counseling and surveillance: Secondary | ICD-10-CM | POA: Diagnosis not present

## 2022-02-02 DIAGNOSIS — I10 Essential (primary) hypertension: Secondary | ICD-10-CM | POA: Diagnosis not present

## 2022-02-02 DIAGNOSIS — Z7189 Other specified counseling: Secondary | ICD-10-CM | POA: Diagnosis not present

## 2022-02-02 DIAGNOSIS — Z299 Encounter for prophylactic measures, unspecified: Secondary | ICD-10-CM | POA: Diagnosis not present

## 2022-04-03 ENCOUNTER — Emergency Department (HOSPITAL_COMMUNITY)
Admission: EM | Admit: 2022-04-03 | Discharge: 2022-04-03 | Disposition: A | Discharge status: Home / Self Care | Payer: Medicare Other | Attending: Emergency Medicine | Admitting: Emergency Medicine

## 2022-04-03 ENCOUNTER — Other Ambulatory Visit: Payer: Self-pay

## 2022-04-03 ENCOUNTER — Encounter (HOSPITAL_COMMUNITY): Payer: Self-pay

## 2022-04-03 ENCOUNTER — Emergency Department (HOSPITAL_COMMUNITY): Payer: Medicare Other

## 2022-04-03 DIAGNOSIS — R531 Weakness: Secondary | ICD-10-CM | POA: Diagnosis not present

## 2022-04-03 DIAGNOSIS — R410 Disorientation, unspecified: Secondary | ICD-10-CM | POA: Diagnosis not present

## 2022-04-03 DIAGNOSIS — Z79899 Other long term (current) drug therapy: Secondary | ICD-10-CM | POA: Insufficient documentation

## 2022-04-03 DIAGNOSIS — Z7901 Long term (current) use of anticoagulants: Secondary | ICD-10-CM | POA: Diagnosis not present

## 2022-04-03 DIAGNOSIS — F0390 Unspecified dementia without behavioral disturbance: Secondary | ICD-10-CM | POA: Insufficient documentation

## 2022-04-03 DIAGNOSIS — R4182 Altered mental status, unspecified: Secondary | ICD-10-CM | POA: Diagnosis present

## 2022-04-03 DIAGNOSIS — N3000 Acute cystitis without hematuria: Secondary | ICD-10-CM | POA: Diagnosis not present

## 2022-04-03 DIAGNOSIS — Z8679 Personal history of other diseases of the circulatory system: Secondary | ICD-10-CM | POA: Diagnosis not present

## 2022-04-03 LAB — URINALYSIS, ROUTINE W REFLEX MICROSCOPIC
Bilirubin Urine: NEGATIVE
Glucose, UA: NEGATIVE mg/dL
Ketones, ur: NEGATIVE mg/dL
Nitrite: NEGATIVE
Protein, ur: NEGATIVE mg/dL
Specific Gravity, Urine: 1.003 — ABNORMAL LOW (ref 1.005–1.030)
pH: 7 (ref 5.0–8.0)

## 2022-04-03 LAB — COMPREHENSIVE METABOLIC PANEL
ALT: 16 U/L (ref 0–44)
AST: 15 U/L (ref 15–41)
Albumin: 3.5 g/dL (ref 3.5–5.0)
Alkaline Phosphatase: 81 U/L (ref 38–126)
Anion gap: 8 (ref 5–15)
BUN: 13 mg/dL (ref 8–23)
CO2: 29 mmol/L (ref 22–32)
Calcium: 9 mg/dL (ref 8.9–10.3)
Chloride: 105 mmol/L (ref 98–111)
Creatinine, Ser: 0.85 mg/dL (ref 0.44–1.00)
GFR, Estimated: >60 mL/min (ref >60)
Glucose, Bld: 79 mg/dL (ref 70–99)
Potassium: 3.7 mmol/L (ref 3.5–5.1)
Sodium: 142 mmol/L (ref 135–145)
Total Bilirubin: 0.7 mg/dL (ref 0.3–1.2)
Total Protein: 6.7 g/dL (ref 6.5–8.1)

## 2022-04-03 LAB — CBG MONITORING, ED: Glucose-Capillary: 76 mg/dL (ref 70–99)

## 2022-04-03 LAB — CBC
HCT: 40.9 % (ref 36.0–46.0)
Hemoglobin: 13.6 g/dL (ref 12.0–15.0)
MCH: 30.4 pg (ref 26.0–34.0)
MCHC: 33.3 g/dL (ref 30.0–36.0)
MCV: 91.5 fL (ref 80.0–100.0)
Platelets: 208 10*3/uL (ref 150–400)
RBC: 4.47 MIL/uL (ref 3.87–5.11)
RDW: 13.8 % (ref 11.5–15.5)
WBC: 9.7 10*3/uL (ref 4.0–10.5)
nRBC: 0 % (ref 0.0–0.2)

## 2022-04-03 MED ORDER — CIPROFLOXACIN HCL 500 MG PO TABS: 500.0000 mg | ORAL_TABLET | Freq: Two times a day (BID) | ORAL | 0 refills | Status: AC

## 2022-04-03 MED ORDER — CIPROFLOXACIN HCL 500 MG PO TABS: 500.0000 mg | ORAL_TABLET | Freq: Two times a day (BID) | ORAL | 0 refills | Status: DC

## 2022-04-03 MED ORDER — CIPROFLOXACIN HCL 250 MG PO TABS
500.0000 mg | ORAL_TABLET | Freq: Once | ORAL | Status: AC
  Administered 2022-04-03: 500 mg via ORAL
  Filled 2022-04-03: qty 2

## 2022-04-03 NOTE — ED Triage Notes (Signed)
Pt arrived to ED via [facility] for AMS from [facility] in [place].

## 2022-04-03 NOTE — ED Notes (Signed)
[facility] will pick up pt.

## 2022-04-03 NOTE — ED Notes (Signed)
Pt verbalized she can take po meds whole without any issues. Son at bedside.

## 2022-04-03 NOTE — ED Notes (Signed)
Pt took po meds without any issues

## 2022-04-03 NOTE — ED Notes (Signed)
[employer] [employer] called to setup transportation at this time.

## 2022-04-03 NOTE — Discharge Instructions (Signed)
Testing shows that you have a likely urinary infection.  Please take the medication called Cipro twice a day for the next 5 days  Thank you for allowing [place] to treat you in the emergency department today.  After reviewing your examination and potential testing that was done it appears that you are safe to go home.  I would like for you to follow-up with your doctor within the next several days, have them obtain your results and follow-up with them to review all of these tests.  If you should develop severe or worsening symptoms return to the emergency department immediately  [facility] liquids

## 2022-04-03 NOTE — ED Notes (Signed)
Pt noted to have xanax, Neurontin, and trazodone on her MAR.

## 2022-04-03 NOTE — ED Provider Notes (Signed)
[facility] Provider Note   CSN: [id number] Arrival date & time: 04/03/22  1124     History  Chief Complaint  Patient presents with   Altered Mental Status    Kathleen Valenzuela is a 71 y.o. female.   Altered Mental Status  Pt from [facility] - with AMS - considered to be sleepier than normal today by NH staff - pt is on Xanax - she had no c/o yesterday.  Pt unable to give much history at this time - very sleepy - though arousable.    Home Medications Prior to Admission medications   Medication Sig Start Date End Date Taking? Authorizing Provider  ciprofloxacin (CIPRO) 500 MG tablet Take 1 tablet (500 mg total) by mouth 2 (two) times daily for 5 days. 04/03/22 04/08/22 Yes Noemi Chapel, MD  albuterol (PROVENTIL HFA;VENTOLIN HFA) 108 (90 BASE) MCG/ACT inhaler Inhale 2 puffs into the lungs 4 (four) times daily. (0800, 1200, 1600, & 2000)    [provider]  ALPRAZolam (XANAX) 1 MG tablet Take 0.5 tablets (0.5 mg total) by mouth 3 (three) times daily as needed for anxiety. (0800, 1400, & 2000) 04/24/17   Rosita Fire, MD  apixaban (ELIQUIS) 5 MG TABS tablet Take 1 tablet (5 mg total) by mouth 2 (two) times daily. 04/15/20   Orson Eva, MD  atorvastatin (LIPITOR) 10 MG tablet Take 1 tablet (10 mg total) by mouth daily at 8 pm. Resume on 04/22/20 if LFTs are back to normal 04/15/20   Tat, David, MD  benztropine (COGENTIN) 1 MG tablet Take 1 mg by mouth 2 (two) times daily. 04/10/20   [provider]  buPROPion (WELLBUTRIN XL) 150 MG 24 hr tablet Take 150 mg by mouth every morning. 03/11/20   [provider]  calcium carbonate (TUMS - DOSED IN MG ELEMENTAL CALCIUM) 500 MG chewable tablet Chew 2 tablets by mouth every 2 (two) hours as needed for indigestion (CHEW BEFORE SWALLOWING).    [provider]  cefdinir (OMNICEF) 300 MG capsule Take 1 capsule (300 mg total) by mouth every 12 (twelve) hours. 04/15/20   Orson Eva, MD  donepezil  (ARICEPT) 10 MG tablet Take 10 mg by mouth daily at 8 pm.    [provider]  Ferrous Gluconate 324 (37.5 Fe) MG TABS Take 324 mg by mouth 2 (two) times daily. (0800 & 2000)    [provider]  fluticasone (FLONASE) 50 MCG/ACT nasal spray Place 1 spray into both nostrils daily. (0800)    [provider]  Fluticasone Furoate-Vilanterol (BREO ELLIPTA) 200-25 MCG/INH AEPB Inhale 1 puff into the lungs daily. (0800)    [provider]  furosemide (LASIX) 40 MG tablet Take 40 mg by mouth daily. (0800)    [provider]  gabapentin (NEURONTIN) 300 MG capsule Take 300 mg by mouth 3 (three) times daily. 11/14/19   [provider]  isosorbide mononitrate (IMDUR) 30 MG 24 hr tablet Take 30 mg by mouth daily. (0800)    [provider]  LATUDA 80 MG TABS tablet Take 80 mg by mouth at bedtime. 11/26/19   [provider]  memantine (NAMENDA) 10 MG tablet Take 10 mg by mouth 2 (two) times daily. 04/10/20   [provider]  Menthol-Zinc Oxide (RISAMINE) 0.44-20.625 % OINT Apply 1 application topically 4 (four) times daily as needed (for rash/redness).    [provider]  metoprolol succinate (TOPROL-XL) 50 MG 24 hr tablet Take 50 mg by mouth  daily. (0800)Take with or immediately following a meal.    [provider]  Nystatin (GERHARDT'S BUTT CREAM) CREA Apply 1 application topically daily. 04/15/20   Orson Eva, MD  Olopatadine HCl (PATADAY) 0.2 % SOLN Place 1 drop into both eyes daily. (0800)    [provider]  ondansetron (ZOFRAN) 4 MG tablet Take 4 mg by mouth every 8 (eight) hours as needed for nausea/vomiting. 04/10/20   [provider]  Oxycodone HCl 10 MG TABS Take 1 tablet (10 mg total) by mouth every 8 (eight) hours as needed (FOR Pain). 04/15/20   Orson Eva, MD  pantoprazole (PROTONIX) 40 MG tablet Take 1 tablet (40 mg total) by mouth 2 (two) times daily before a meal. 02/17/17   Rehman, Mechele Dawley,  MD  potassium chloride (KLOR-CON) 10 MEQ tablet Take 1 tablet by mouth daily. 04/10/20   [provider]  tiZANidine (ZANAFLEX) 2 MG tablet Take 2 mg by mouth 2 (two) times daily. (0800 & 2000) 12/01/16   [provider]  traZODone (DESYREL) 100 MG tablet Take 100 mg by mouth at bedtime. 04/10/20   [provider]  venlafaxine XR (EFFEXOR-XR) 150 MG 24 hr capsule Take 300 mg by mouth daily. 04/10/20   [provider]      Allergies    Penicillins    Review of Systems   Review of Systems  Unable to perform ROS: Dementia    Physical Exam Updated Vital Signs BP 132/65   Pulse 61   Temp (!) 97.3 F (36.3 C) (Oral)   Resp 17   Ht 1.702 m ('5\' 7"'$ )   Wt 81.6 kg   SpO2 96%   BMI 28.19 kg/m  Physical Exam Vitals and nursing note reviewed.  Constitutional:      General: She is not in acute distress.    Appearance: She is well-developed.     Comments: Somnolent but arousable - mumbles her name.  HENT:     Head: Normocephalic and atraumatic.     Nose: No congestion or rhinorrhea.     Mouth/Throat:     Mouth: Mucous membranes are dry.     Pharynx: No oropharyngeal exudate.  Eyes:     General: No scleral icterus.       Right eye: No discharge.        Left eye: No discharge.     Conjunctiva/sclera: Conjunctivae normal.     Pupils: Pupils are equal, round, and reactive to light.  Neck:     Thyroid: No thyromegaly.     Vascular: No JVD.  Cardiovascular:     Rate and Rhythm: Normal rate and regular rhythm.     Heart sounds: Normal heart sounds. No murmur heard.    No friction rub. No gallop.  Pulmonary:     Effort: Pulmonary effort is normal.     Breath sounds: Normal breath sounds. No wheezing or rales.     Comments: Slightly slowed respirations, lungs clear Abdominal:     General: Bowel sounds are normal. There is no distension.     Palpations: Abdomen is soft. There is no mass.     Tenderness: There is no abdominal tenderness.  Musculoskeletal:         General: No tenderness. Normal range of motion.     Cervical back: Normal range of motion and neck supple.     Right lower leg: No edema.     Left lower leg: No edema.  Lymphadenopathy:     Cervical:  No cervical adenopathy.  Skin:    General: Skin is warm and dry.     Findings: No erythema or rash.  Neurological:     Comments: No focal weakness, follows commands by lifting both arms, both legs -s eems symnmetrical - no facial droop - some quiet mumbling speech - answers simple questions - sleeps quickly when not engaged.  Psychiatric:        Behavior: Behavior normal.     ED Results / Procedures / Treatments   Labs (all labs ordered are listed, but only abnormal results are displayed) Labs Reviewed  URINALYSIS, ROUTINE W REFLEX MICROSCOPIC - Abnormal; Notable for the following components:      Result Value   Color, Urine STRAW (*)    Specific Gravity, Urine 1.003 (*)    Hgb urine dipstick SMALL (*)    Leukocytes,Ua TRACE (*)    Bacteria, UA MANY (*)    All other components within normal limits  URINE CULTURE  COMPREHENSIVE METABOLIC PANEL  CBC  CBG MONITORING, ED    EKG None  Radiology DG Chest Port 1 View  Result Date: 04/03/2022 CLINICAL DATA:  Acute mental status change. History of heart failure. EXAM: PORTABLE CHEST 1 VIEW COMPARISON:  April 13, 2020 FINDINGS: The heart size and mediastinal contours are within normal limits. Both lungs are clear. The visualized skeletal structures are unremarkable. IMPRESSION: No active disease. Electronically Signed   By: Dorise Bullion III M.D.   On: 04/03/2022 12:30    Procedures Procedures    Medications Ordered in ED Medications  ciprofloxacin (CIPRO) tablet 500 mg (has no administration in time range)    ED Course/ Medical Decision Making/ A&P                           Medical Decision Making Amount and/or Complexity of Data Reviewed Labs: ordered. Radiology: ordered.  Risk Prescription drug  management.   This patient presents to the ED for concern of generalized sleepiness and altered mental status, this involves an extensive number of treatment options, and is a complaint that carries with it a high risk of complications and morbidity.  The differential diagnosis includes possible medication reactions given the combination of Xanax Neurontin and trazodone.  This could also be related to underlying infection or metabolic abnormalities, vital signs thankfully are very normal   Co morbidities that complicate the patient evaluation  Multiple medications, the patient is very elderly, polypharmacy   Additional history obtained:  Additional history obtained from electronic medical record External records from outside source obtained and reviewed including multiple treatments through the patient's primary care doctor, no recent emergency department or hospitalizations   Lab Tests:  I Ordered, and personally interpreted labs.  The pertinent results include: Likely urinary tract infection, labs otherwise unremarkable, there is many bacteria on the urine sample which was in and out catheter sample, culture sent   Imaging Studies ordered:  I ordered imaging studies including chest x-ray I independently visualized and interpreted imaging which showed no acute findings such as pneumonia I agree with the radiologist interpretation   Cardiac Monitoring: / EKG:  The patient was maintained on a cardiac monitor.  I personally viewed and interpreted the cardiac monitored which showed an underlying rhythm of: Normal sinus rhythm, no arrhythmias   Problem List / ED Course / Critical interventions / Medication management  The patient has some generalized weakness, was initially somnolent to obtunded but gradually improved woke up  and is requesting something to drink.  Tolerating fluids, tolerated oral Cipro I ordered medication including ciprofloxacin for urinary tract  infection Reevaluation of the patient after these medicines showed that the patient improved I have reviewed the patients home medicines and have made adjustments as needed   Social Determinants of Health:  Nursing home patient   Test / Admission - Considered:  Considered admission but the patient's vital signs are normal, her mental status is normal, her level of alertness has returned to baseline and she is tolerating fluid and antibiotics, no other findings on exam or history or laboratory work-up which would require inpatient admission or treatment.  Specifically I do not see any signs of focal weakness to suggest a stroke        Final Clinical Impression(s) / ED Diagnoses Final diagnoses:  Generalized weakness  Acute cystitis without hematuria    Rx / DC Orders ED Discharge Orders          Ordered    ciprofloxacin (CIPRO) 500 MG tablet  2 times daily        04/03/22 1313              Noemi Chapel, MD 04/03/22 1317

## 2022-04-05 LAB — URINE CULTURE: Culture: >=100000 — AB

## 2022-04-06 ENCOUNTER — Telehealth: Payer: Self-pay | Admitting: *Deleted

## 2022-04-06 NOTE — Progress Notes (Addendum)
ED Antimicrobial Stewardship Positive Culture Follow Up   Kathleen Valenzuela is an 71 y.o. female who presented to [facility] on 04/03/2022 with a chief complaint of AMS on Xanax, trazodone, and gabapentin. Pt has dementia at baseline.  Chief Complaint  Patient presents with   Altered Mental Status    Recent Results (from the past 720 hour(s))  Urine Culture     Status: Abnormal   Collection Time: 04/03/22  1:13 PM   Specimen: Urine, Clean Catch  Result Value Ref Range Status   Specimen Description   Final    URINE, CLEAN CATCH Performed at [facility], [address], [place], [place] [address]    Special Requests   Final    NONE Performed at [facility], [address], [place], [place] [address]    Culture (A)  Final    >=100,000 COLONIES/mL ESCHERICHIA COLI >=100,000 COLONIES/mL STREPTOCOCCUS GALLOLYTICUS Usually susceptible to penicillin and other beta lactam agents,quinolones,macrolides and tetracyclines. Performed at [facility], [place] [address] [place], [place] [address]    Report Status 04/05/2022 FINAL  Final   Organism ID, Bacteria ESCHERICHIA COLI (A)  Final      Susceptibility   Escherichia coli - MIC*    AMPICILLIN 16 INTERMEDIATE Intermediate     CEFAZOLIN <=4 SENSITIVE Sensitive     CEFEPIME 0.25 SENSITIVE Sensitive     CEFTRIAXONE <=0.25 SENSITIVE Sensitive     CIPROFLOXACIN >=4 RESISTANT Resistant     GENTAMICIN <=1 SENSITIVE Sensitive     IMIPENEM <=0.25 SENSITIVE Sensitive     NITROFURANTOIN <=16 SENSITIVE Sensitive     TRIMETH/SULFA 40 SENSITIVE Sensitive     AMPICILLIN/SULBACTAM 8 SENSITIVE Sensitive     PIP/TAZO 16 SENSITIVE Sensitive     * >=100,000 COLONIES/mL ESCHERICHIA COLI    '[x]'$  Treated with Cipro, organism resistant to prescribed antimicrobial  New antibiotic plan: STOP Cipro. START cephalexin '500mg'$  BID PO x 5 days.   ED Provider: Dion Saucier, PA-C   [place] 04/06/2022, 10:14 AM Clinical Pharmacist Monday - Friday phone -   [phone] Saturday - Sunday phone - [phone]

## 2022-04-06 NOTE — Telephone Encounter (Signed)
Post ED Visit - Positive Culture Follow-up: Successful Patient Follow-Up  Culture assessed and recommendations reviewed by:  '[]'$  Elenor Quinones, Pharm.D. '[]'$  Heide Guile, Pharm.D., BCPS AQ-ID '[]'$  Parks Neptune, Pharm.D., BCPS '[]'$  Alycia Rossetti, Pharm.D., BCPS '[]'$  [place], Pharm.D., BCPS, AAHIVP '[]'$  Legrand Como, Pharm.D., BCPS, AAHIVP '[]'$  Salome Arnt, PharmD, BCPS '[]'$  Johnnette Gourd, PharmD, BCPS '[]'$  Hughes Better, PharmD, BCPS '[]'$  Leeroy Cha, PharmD  Positive urine culture  '[]'$  Patient discharged without antimicrobial prescription and treatment is now indicated '[x]'$  Organism is resistant to prescribed ED discharge antimicrobial '[]'$  Patient with positive blood cultures  Changes discussed with ED provider: Dion Saucier, PA-C  New antibiotic prescription Stop Cipro,  Start Cephalexin '500mg'$  PO BID x 5 daysFaxed  to [facility] [phone], Fax [phone]  Contacted [place], date 04/06/2022, time [place], [place] 04/06/2022, 11:07 AM

## 2022-04-11 DIAGNOSIS — Z299 Encounter for prophylactic measures, unspecified: Secondary | ICD-10-CM | POA: Diagnosis not present

## 2022-04-11 DIAGNOSIS — N390 Urinary tract infection, site not specified: Secondary | ICD-10-CM | POA: Diagnosis not present

## 2022-04-11 DIAGNOSIS — I10 Essential (primary) hypertension: Secondary | ICD-10-CM | POA: Diagnosis not present

## 2022-04-14 ENCOUNTER — Telehealth: Payer: Self-pay

## 2022-04-14 NOTE — Telephone Encounter (Signed)
     Patient  visit on 7/30  at [facility] was for uti  Have you been able to follow up with your primary care physician?yes  The patient was or was not able to obtain any needed medicine or equipment.yes  Are there diet recommendations that you are having difficulty following?na  Patient expresses understanding of discharge instructions and education provided has no other needs at this time. yes     [facility]  [phone] [address], [place], [place] [address] Phone: [phone] Email: [email or web address]

## 2022-04-18 ENCOUNTER — Emergency Department (HOSPITAL_COMMUNITY): Payer: Medicare Other

## 2022-04-18 ENCOUNTER — Encounter (HOSPITAL_COMMUNITY): Payer: Self-pay

## 2022-04-18 ENCOUNTER — Other Ambulatory Visit: Payer: Self-pay

## 2022-04-18 ENCOUNTER — Emergency Department (HOSPITAL_COMMUNITY)
Admission: EM | Admit: 2022-04-18 | Discharge: 2022-04-18 | Disposition: A | Discharge status: Skilled Nursing Facility | Payer: Medicare Other | Attending: Emergency Medicine | Admitting: Emergency Medicine

## 2022-04-18 DIAGNOSIS — Z79899 Other long term (current) drug therapy: Secondary | ICD-10-CM | POA: Diagnosis not present

## 2022-04-18 DIAGNOSIS — Z7901 Long term (current) use of anticoagulants: Secondary | ICD-10-CM | POA: Insufficient documentation

## 2022-04-18 DIAGNOSIS — Z743 Need for continuous supervision: Secondary | ICD-10-CM | POA: Diagnosis not present

## 2022-04-18 DIAGNOSIS — M542 Cervicalgia: Secondary | ICD-10-CM | POA: Diagnosis not present

## 2022-04-18 DIAGNOSIS — J3489 Other specified disorders of nose and nasal sinuses: Secondary | ICD-10-CM | POA: Diagnosis not present

## 2022-04-18 DIAGNOSIS — N3000 Acute cystitis without hematuria: Secondary | ICD-10-CM | POA: Insufficient documentation

## 2022-04-18 DIAGNOSIS — F0390 Unspecified dementia without behavioral disturbance: Secondary | ICD-10-CM | POA: Insufficient documentation

## 2022-04-18 DIAGNOSIS — W01198A Fall on same level from slipping, tripping and stumbling with subsequent striking against other object, initial encounter: Secondary | ICD-10-CM | POA: Diagnosis not present

## 2022-04-18 DIAGNOSIS — S0990XA Unspecified injury of head, initial encounter: Secondary | ICD-10-CM | POA: Insufficient documentation

## 2022-04-18 DIAGNOSIS — Y92129 Unspecified place in nursing home as the place of occurrence of the external cause: Secondary | ICD-10-CM | POA: Diagnosis not present

## 2022-04-18 DIAGNOSIS — S199XXA Unspecified injury of neck, initial encounter: Secondary | ICD-10-CM | POA: Diagnosis not present

## 2022-04-18 DIAGNOSIS — R6889 Other general symptoms and signs: Secondary | ICD-10-CM | POA: Diagnosis not present

## 2022-04-18 DIAGNOSIS — W19XXXA Unspecified fall, initial encounter: Secondary | ICD-10-CM

## 2022-04-18 DIAGNOSIS — M5030 Other cervical disc degeneration, unspecified cervical region: Secondary | ICD-10-CM | POA: Diagnosis not present

## 2022-04-18 LAB — URINALYSIS, ROUTINE W REFLEX MICROSCOPIC
Bilirubin Urine: NEGATIVE
Glucose, UA: NEGATIVE mg/dL
Hgb urine dipstick: NEGATIVE
Ketones, ur: NEGATIVE mg/dL
Nitrite: NEGATIVE
Protein, ur: NEGATIVE mg/dL
Specific Gravity, Urine: 1.008 (ref 1.005–1.030)
pH: 7 (ref 5.0–8.0)

## 2022-04-18 MED ORDER — SULFAMETHOXAZOLE-TRIMETHOPRIM 800-160 MG PO TABS: 1.0000 | ORAL_TABLET | Freq: Two times a day (BID) | ORAL | 0 refills | Status: AC

## 2022-04-18 NOTE — ED Triage Notes (Signed)
Patient will only mumble when questions asked. Per EMS this is patient's baseline and how she acted while at facility.

## 2022-04-18 NOTE — ED Triage Notes (Signed)
BIB by ambulance from [facility] with complaints of fall this am. Complaining of neck pain with bilateral leg pain. Patient is on blood thinner, hit head with no LOC. C-collar placed by EMS. Patient is demented at baseline. Facility requesting that patient be checked for UTI. Ambulates with walker, fall mechanical in nature.

## 2022-04-18 NOTE — ED Provider Notes (Signed)
[facility] EMERGENCY DEPARTMENT Provider Note   CSN: [id number] Arrival date & time: 04/18/22  6440     History {Add pertinent medical, surgical, social history, OB history to HPI:1} Chief Complaint  Patient presents with   Kathleen Valenzuela is a 71 y.o. female.  Patient with severe dementia.  She had a fall hit her head.  The nursing home monitor your check.   Fall       Home Medications Prior to Admission medications   Medication Sig Start Date End Date Taking? Authorizing Provider  sulfamethoxazole-trimethoprim (BACTRIM DS) 800-160 MG tablet Take 1 tablet by mouth 2 (two) times daily for 7 days. 04/18/22 04/25/22 Yes Milton Ferguson, MD  albuterol (PROVENTIL HFA;VENTOLIN HFA) 108 (90 BASE) MCG/ACT inhaler Inhale 2 puffs into the lungs 4 (four) times daily. (0800, 1200, 1600, & 2000)    [provider]  ALPRAZolam (XANAX) 1 MG tablet Take 0.5 tablets (0.5 mg total) by mouth 3 (three) times daily as needed for anxiety. (0800, 1400, & 2000) 04/24/17   Rosita Fire, MD  apixaban (ELIQUIS) 5 MG TABS tablet Take 1 tablet (5 mg total) by mouth 2 (two) times daily. 04/15/20   Orson Eva, MD  atorvastatin (LIPITOR) 10 MG tablet Take 1 tablet (10 mg total) by mouth daily at 8 pm. Resume on 04/22/20 if LFTs are back to normal 04/15/20   Tat, David, MD  benztropine (COGENTIN) 1 MG tablet Take 1 mg by mouth 2 (two) times daily. 04/10/20   [provider]  buPROPion (WELLBUTRIN XL) 150 MG 24 hr tablet Take 150 mg by mouth every morning. 03/11/20   [provider]  calcium carbonate (TUMS - DOSED IN MG ELEMENTAL CALCIUM) 500 MG chewable tablet Chew 2 tablets by mouth every 2 (two) hours as needed for indigestion (CHEW BEFORE SWALLOWING).    [provider]  cefdinir (OMNICEF) 300 MG capsule Take 1 capsule (300 mg total) by mouth every 12 (twelve) hours. 04/15/20   Orson Eva, MD  donepezil (ARICEPT) 10 MG tablet Take 10 mg by mouth daily at 8 pm.     [provider]  Ferrous Gluconate 324 (37.5 Fe) MG TABS Take 324 mg by mouth 2 (two) times daily. (0800 & 2000)    [provider]  fluticasone (FLONASE) 50 MCG/ACT nasal spray Place 1 spray into both nostrils daily. (0800)    [provider]  Fluticasone Furoate-Vilanterol (BREO ELLIPTA) 200-25 MCG/INH AEPB Inhale 1 puff into the lungs daily. (0800)    [provider]  furosemide (LASIX) 40 MG tablet Take 40 mg by mouth daily. (0800)    [provider]  gabapentin (NEURONTIN) 300 MG capsule Take 300 mg by mouth 3 (three) times daily. 11/14/19   [provider]  isosorbide mononitrate (IMDUR) 30 MG 24 hr tablet Take 30 mg by mouth daily. (0800)    [provider]  LATUDA 80 MG TABS tablet Take 80 mg by mouth at bedtime. 11/26/19   [provider]  memantine (NAMENDA) 10 MG tablet Take 10 mg by mouth 2 (two) times daily. 04/10/20   [provider]  Menthol-Zinc Oxide (RISAMINE) 0.44-20.625 % OINT Apply 1 application topically 4 (four) times daily as needed (for rash/redness).    [provider]  metoprolol succinate (TOPROL-XL) 50 MG 24 hr tablet Take 50 mg by mouth daily. (0800)Take with or immediately following a meal.    [provider]  Nystatin (GERHARDT'S BUTT CREAM) CREA  Apply 1 application topically daily. 04/15/20   Orson Eva, MD  Olopatadine HCl (PATADAY) 0.2 % SOLN Place 1 drop into both eyes daily. (0800)    [provider]  ondansetron (ZOFRAN) 4 MG tablet Take 4 mg by mouth every 8 (eight) hours as needed for nausea/vomiting. 04/10/20   [provider]  Oxycodone HCl 10 MG TABS Take 1 tablet (10 mg total) by mouth every 8 (eight) hours as needed (FOR Pain). 04/15/20   Orson Eva, MD  pantoprazole (PROTONIX) 40 MG tablet Take 1 tablet (40 mg total) by mouth 2 (two) times daily before a meal. 02/17/17   Rehman, Mechele Dawley, MD  potassium chloride (KLOR-CON) 10 MEQ tablet Take 1 tablet  by mouth daily. 04/10/20   [provider]  tiZANidine (ZANAFLEX) 2 MG tablet Take 2 mg by mouth 2 (two) times daily. (0800 & 2000) 12/01/16   [provider]  traZODone (DESYREL) 100 MG tablet Take 100 mg by mouth at bedtime. 04/10/20   [provider]  venlafaxine XR (EFFEXOR-XR) 150 MG 24 hr capsule Take 300 mg by mouth daily. 04/10/20   [provider]      Allergies    Penicillins    Review of Systems   Review of Systems  Physical Exam Updated Vital Signs BP (!) 142/60   Pulse 64   Temp 98.2 F (36.8 C) (Oral)   Resp 13   Ht '5\' 7"'$  (1.702 m)   Wt 81.6 kg   SpO2 95%   BMI 28.18 kg/m  Physical Exam  ED Results / Procedures / Treatments   Labs (all labs ordered are listed, but only abnormal results are displayed) Labs Reviewed  URINALYSIS, ROUTINE W REFLEX MICROSCOPIC - Abnormal; Notable for the following components:      Result Value   APPearance HAZY (*)    Leukocytes,Ua LARGE (*)    Bacteria, UA RARE (*)    Non Squamous Epithelial 0-5 (*)    All other components within normal limits  URINE CULTURE    EKG None  Radiology DG Pelvis 1-2 Views  Result Date: 04/18/2022 CLINICAL DATA:  Fall, pain EXAM: PELVIS - 1-2 VIEW COMPARISON:  None Available. FINDINGS: No evident displaced fracture or dislocation of the pelvis or bilateral proximal femurs. The bilateral hips are internally rotated, which limits assessment for fracture. Hip joint spaces are preserved. Nonobstructive pattern of overlying bowel gas. IMPRESSION: No evident displaced fracture or dislocation of the pelvis or bilateral proximal femurs in single frontal view. The bilateral hips are internally rotated, which limits assessment for fracture. Consider additional radiographic views or CT to further evaluate if there is high clinical concern for fracture. Electronically Signed   By: Delanna Ahmadi M.D.   On: 04/18/2022 08:41   CT Head Wo Contrast  Result Date: 04/18/2022 CLINICAL  DATA:  Head trauma, minor (Age >= 65y); Neck trauma (Age >= 65y) EXAM: CT HEAD WITHOUT CONTRAST CT CERVICAL SPINE WITHOUT CONTRAST TECHNIQUE: Multidetector CT imaging of the head and cervical spine was performed following the standard protocol without intravenous contrast. Multiplanar CT image reconstructions of the cervical spine were also generated. RADIATION DOSE REDUCTION: This exam was performed according to the departmental dose-optimization program which includes automated exposure control, adjustment of the mA and/or kV according to patient size and/or use of iterative reconstruction technique. COMPARISON:  None Available. FINDINGS: CT HEAD FINDINGS Brain: No evidence of acute infarction, hemorrhage, hydrocephalus, extra-axial collection or mass lesion/mass effect. Vascular: No hyperdense vessel identified. Skull:  No acute fracture. Sinuses/Orbits: Scattered mild paranasal sinus mucosal thickening. No acute orbital findings. Other: No mastoid effusions. CT CERVICAL SPINE FINDINGS Alignment: No substantial sagittal subluxation. Skull base and vertebrae: Vertebral body heights are maintained. No evidence of acute fracture. Soft tissues and spinal canal: No prevertebral fluid or swelling. No visible canal hematoma. Disc levels: Multilevel facet and uncovertebral hypertrophy with varying degrees of neural foraminal stenosis. Multilevel degenerative disc disease, greatest in the lower cervical spine. Upper chest: Visualized lung apices are clear. IMPRESSION: 1. No evidence of acute intracranial abnormality. 2. No evidence of acute fracture or traumatic. Electronically Signed   By: Margaretha Sheffield M.D.   On: 04/18/2022 08:38   CT Cervical Spine Wo Contrast  Result Date: 04/18/2022 CLINICAL DATA:  Head trauma, minor (Age >= 65y); Neck trauma (Age >= 65y) EXAM: CT HEAD WITHOUT CONTRAST CT CERVICAL SPINE WITHOUT CONTRAST TECHNIQUE: Multidetector CT imaging of the head and cervical spine was performed following  the standard protocol without intravenous contrast. Multiplanar CT image reconstructions of the cervical spine were also generated. RADIATION DOSE REDUCTION: This exam was performed according to the departmental dose-optimization program which includes automated exposure control, adjustment of the mA and/or kV according to patient size and/or use of iterative reconstruction technique. COMPARISON:  None Available. FINDINGS: CT HEAD FINDINGS Brain: No evidence of acute infarction, hemorrhage, hydrocephalus, extra-axial collection or mass lesion/mass effect. Vascular: No hyperdense vessel identified. Skull: No acute fracture. Sinuses/Orbits: Scattered mild paranasal sinus mucosal thickening. No acute orbital findings. Other: No mastoid effusions. CT CERVICAL SPINE FINDINGS Alignment: No substantial sagittal subluxation. Skull base and vertebrae: Vertebral body heights are maintained. No evidence of acute fracture. Soft tissues and spinal canal: No prevertebral fluid or swelling. No visible canal hematoma. Disc levels: Multilevel facet and uncovertebral hypertrophy with varying degrees of neural foraminal stenosis. Multilevel degenerative disc disease, greatest in the lower cervical spine. Upper chest: Visualized lung apices are clear. IMPRESSION: 1. No evidence of acute intracranial abnormality. 2. No evidence of acute fracture or traumatic. Electronically Signed   By: Margaretha Sheffield M.D.   On: 04/18/2022 08:38    Procedures Procedures  {Document cardiac monitor, telemetry assessment procedure when appropriate:1}  Medications Ordered in ED Medications - No data to display  ED Course/ Medical Decision Making/ A&P                           Medical Decision Making Amount and/or Complexity of Data Reviewed Labs: ordered. Radiology: ordered.  Risk Prescription drug management.   Patient with fall no significant injuries.  Urinalysis suggest UTI.  She will get a culture done and be started on  Bactrim  {Document critical care time when appropriate:1} {Document review of labs and clinical decision tools ie heart score, Chads2Vasc2 etc:1}  {Document your independent review of radiology images, and any outside records:1} {Document your discussion with family members, caretakers, and with consultants:1} {Document social determinants of health affecting pt's care:1} {Document your decision making why or why not admission, treatments were needed:1} Final Clinical Impression(s) / ED Diagnoses Final diagnoses:  Fall, initial encounter  Acute cystitis without hematuria    Rx / DC Orders ED Discharge Orders          Ordered    sulfamethoxazole-trimethoprim (BACTRIM DS) 800-160 MG tablet  2 times daily        04/18/22 1032

## 2022-04-18 NOTE — ED Notes (Signed)
X-ray states necklace in pants pocket.

## 2022-04-18 NOTE — Discharge Instructions (Signed)
Take Tylenol for pain.  Follow-up with your doctor next week to make sure the urine has cleared up

## 2022-04-20 LAB — URINE CULTURE: Culture: <10000 — AB

## 2022-04-22 DIAGNOSIS — J449 Chronic obstructive pulmonary disease, unspecified: Secondary | ICD-10-CM | POA: Diagnosis not present

## 2022-04-22 DIAGNOSIS — I10 Essential (primary) hypertension: Secondary | ICD-10-CM | POA: Diagnosis not present

## 2022-04-22 DIAGNOSIS — Z713 Dietary counseling and surveillance: Secondary | ICD-10-CM | POA: Diagnosis not present

## 2022-04-22 DIAGNOSIS — Z299 Encounter for prophylactic measures, unspecified: Secondary | ICD-10-CM | POA: Diagnosis not present

## 2022-05-12 DIAGNOSIS — Z299 Encounter for prophylactic measures, unspecified: Secondary | ICD-10-CM | POA: Diagnosis not present

## 2022-05-12 DIAGNOSIS — I10 Essential (primary) hypertension: Secondary | ICD-10-CM | POA: Diagnosis not present

## 2022-05-12 DIAGNOSIS — J449 Chronic obstructive pulmonary disease, unspecified: Secondary | ICD-10-CM | POA: Diagnosis not present

## 2022-05-12 DIAGNOSIS — R350 Frequency of micturition: Secondary | ICD-10-CM | POA: Diagnosis not present

## 2022-06-09 DIAGNOSIS — Z23 Encounter for immunization: Secondary | ICD-10-CM | POA: Diagnosis not present

## 2022-07-22 DIAGNOSIS — I10 Essential (primary) hypertension: Secondary | ICD-10-CM | POA: Diagnosis not present

## 2022-07-22 DIAGNOSIS — Z789 Other specified health status: Secondary | ICD-10-CM | POA: Diagnosis not present

## 2022-07-22 DIAGNOSIS — J449 Chronic obstructive pulmonary disease, unspecified: Secondary | ICD-10-CM | POA: Diagnosis not present

## 2022-07-25 DIAGNOSIS — F5101 Primary insomnia: Secondary | ICD-10-CM | POA: Diagnosis not present

## 2022-07-25 DIAGNOSIS — F03B4 Unspecified dementia, moderate, with anxiety: Secondary | ICD-10-CM | POA: Diagnosis not present

## 2022-08-10 DIAGNOSIS — M545 Low back pain, unspecified: Secondary | ICD-10-CM | POA: Diagnosis not present

## 2022-08-10 DIAGNOSIS — I10 Essential (primary) hypertension: Secondary | ICD-10-CM | POA: Diagnosis not present

## 2022-08-10 DIAGNOSIS — R5383 Other fatigue: Secondary | ICD-10-CM | POA: Diagnosis not present

## 2022-08-10 DIAGNOSIS — Z0000 Encounter for general adult medical examination without abnormal findings: Secondary | ICD-10-CM | POA: Diagnosis not present

## 2022-08-10 DIAGNOSIS — E785 Hyperlipidemia, unspecified: Secondary | ICD-10-CM | POA: Diagnosis not present

## 2022-08-10 DIAGNOSIS — Z299 Encounter for prophylactic measures, unspecified: Secondary | ICD-10-CM | POA: Diagnosis not present

## 2022-08-24 ENCOUNTER — Other Ambulatory Visit: Payer: Self-pay | Admitting: Internal Medicine

## 2022-08-24 DIAGNOSIS — Z1231 Encounter for screening mammogram for malignant neoplasm of breast: Secondary | ICD-10-CM

## 2022-08-25 ENCOUNTER — Emergency Department (HOSPITAL_COMMUNITY): Payer: Medicare Other

## 2022-08-25 ENCOUNTER — Inpatient Hospital Stay (HOSPITAL_COMMUNITY)
Admission: EM | Admit: 2022-08-25 | Discharge: 2022-09-16 | DRG: 871 | Disposition: A | Discharge status: Skilled Nursing Facility | Payer: Medicare Other | Source: Skilled Nursing Facility | Attending: Internal Medicine | Admitting: Internal Medicine

## 2022-08-25 ENCOUNTER — Other Ambulatory Visit: Payer: Self-pay

## 2022-08-25 ENCOUNTER — Encounter (HOSPITAL_COMMUNITY): Payer: Self-pay | Admitting: Emergency Medicine

## 2022-08-25 DIAGNOSIS — Z79899 Other long term (current) drug therapy: Secondary | ICD-10-CM | POA: Diagnosis not present

## 2022-08-25 DIAGNOSIS — R1319 Other dysphagia: Secondary | ICD-10-CM | POA: Diagnosis not present

## 2022-08-25 DIAGNOSIS — D72829 Elevated white blood cell count, unspecified: Secondary | ICD-10-CM | POA: Diagnosis not present

## 2022-08-25 DIAGNOSIS — F0283 Dementia in other diseases classified elsewhere, unspecified severity, with mood disturbance: Secondary | ICD-10-CM | POA: Diagnosis not present

## 2022-08-25 DIAGNOSIS — K8043 Calculus of bile duct with acute cholecystitis with obstruction: Secondary | ICD-10-CM | POA: Diagnosis present

## 2022-08-25 DIAGNOSIS — Z0189 Encounter for other specified special examinations: Secondary | ICD-10-CM | POA: Diagnosis not present

## 2022-08-25 DIAGNOSIS — F319 Bipolar disorder, unspecified: Secondary | ICD-10-CM | POA: Diagnosis present

## 2022-08-25 DIAGNOSIS — E440 Moderate protein-calorie malnutrition: Secondary | ICD-10-CM | POA: Diagnosis present

## 2022-08-25 DIAGNOSIS — A419 Sepsis, unspecified organism: Principal | ICD-10-CM | POA: Diagnosis present

## 2022-08-25 DIAGNOSIS — Z1152 Encounter for screening for COVID-19: Secondary | ICD-10-CM

## 2022-08-25 DIAGNOSIS — I5030 Unspecified diastolic (congestive) heart failure: Secondary | ICD-10-CM | POA: Diagnosis present

## 2022-08-25 DIAGNOSIS — K2970 Gastritis, unspecified, without bleeding: Secondary | ICD-10-CM | POA: Diagnosis present

## 2022-08-25 DIAGNOSIS — J9811 Atelectasis: Secondary | ICD-10-CM | POA: Diagnosis not present

## 2022-08-25 DIAGNOSIS — I4891 Unspecified atrial fibrillation: Secondary | ICD-10-CM | POA: Diagnosis not present

## 2022-08-25 DIAGNOSIS — Z87891 Personal history of nicotine dependence: Secondary | ICD-10-CM

## 2022-08-25 DIAGNOSIS — F03918 Unspecified dementia, unspecified severity, with other behavioral disturbance: Secondary | ICD-10-CM | POA: Diagnosis present

## 2022-08-25 DIAGNOSIS — F02818 Dementia in other diseases classified elsewhere, unspecified severity, with other behavioral disturbance: Secondary | ICD-10-CM | POA: Diagnosis present

## 2022-08-25 DIAGNOSIS — K219 Gastro-esophageal reflux disease without esophagitis: Secondary | ICD-10-CM | POA: Diagnosis not present

## 2022-08-25 DIAGNOSIS — K5730 Diverticulosis of large intestine without perforation or abscess without bleeding: Secondary | ICD-10-CM | POA: Diagnosis not present

## 2022-08-25 DIAGNOSIS — K819 Cholecystitis, unspecified: Secondary | ICD-10-CM | POA: Diagnosis not present

## 2022-08-25 DIAGNOSIS — I700 Atherosclerosis of aorta: Secondary | ICD-10-CM | POA: Diagnosis not present

## 2022-08-25 DIAGNOSIS — K8050 Calculus of bile duct without cholangitis or cholecystitis without obstruction: Secondary | ICD-10-CM

## 2022-08-25 DIAGNOSIS — B370 Candidal stomatitis: Secondary | ICD-10-CM | POA: Diagnosis present

## 2022-08-25 DIAGNOSIS — K828 Other specified diseases of gallbladder: Secondary | ICD-10-CM | POA: Diagnosis not present

## 2022-08-25 DIAGNOSIS — F0284 Dementia in other diseases classified elsewhere, unspecified severity, with anxiety: Secondary | ICD-10-CM | POA: Diagnosis present

## 2022-08-25 DIAGNOSIS — D7389 Other diseases of spleen: Secondary | ICD-10-CM | POA: Diagnosis not present

## 2022-08-25 DIAGNOSIS — K810 Acute cholecystitis: Principal | ICD-10-CM | POA: Diagnosis present

## 2022-08-25 DIAGNOSIS — Z743 Need for continuous supervision: Secondary | ICD-10-CM | POA: Diagnosis not present

## 2022-08-25 DIAGNOSIS — E876 Hypokalemia: Secondary | ICD-10-CM | POA: Diagnosis not present

## 2022-08-25 DIAGNOSIS — D649 Anemia, unspecified: Secondary | ICD-10-CM | POA: Diagnosis not present

## 2022-08-25 DIAGNOSIS — I482 Chronic atrial fibrillation, unspecified: Secondary | ICD-10-CM | POA: Diagnosis not present

## 2022-08-25 DIAGNOSIS — R6520 Severe sepsis without septic shock: Secondary | ICD-10-CM | POA: Diagnosis not present

## 2022-08-25 DIAGNOSIS — I5033 Acute on chronic diastolic (congestive) heart failure: Secondary | ICD-10-CM | POA: Diagnosis not present

## 2022-08-25 DIAGNOSIS — R1011 Right upper quadrant pain: Secondary | ICD-10-CM | POA: Diagnosis not present

## 2022-08-25 DIAGNOSIS — R410 Disorientation, unspecified: Secondary | ICD-10-CM | POA: Diagnosis not present

## 2022-08-25 DIAGNOSIS — K2950 Unspecified chronic gastritis without bleeding: Secondary | ICD-10-CM | POA: Diagnosis not present

## 2022-08-25 DIAGNOSIS — K831 Obstruction of bile duct: Secondary | ICD-10-CM | POA: Diagnosis not present

## 2022-08-25 DIAGNOSIS — M6281 Muscle weakness (generalized): Secondary | ICD-10-CM | POA: Diagnosis not present

## 2022-08-25 DIAGNOSIS — E7800 Pure hypercholesterolemia, unspecified: Secondary | ICD-10-CM | POA: Diagnosis present

## 2022-08-25 DIAGNOSIS — D6959 Other secondary thrombocytopenia: Secondary | ICD-10-CM | POA: Diagnosis not present

## 2022-08-25 DIAGNOSIS — Z880 Allergy status to penicillin: Secondary | ICD-10-CM

## 2022-08-25 DIAGNOSIS — Z7189 Other specified counseling: Secondary | ICD-10-CM

## 2022-08-25 DIAGNOSIS — Z7401 Bed confinement status: Secondary | ICD-10-CM | POA: Diagnosis not present

## 2022-08-25 DIAGNOSIS — K651 Peritoneal abscess: Secondary | ICD-10-CM | POA: Diagnosis not present

## 2022-08-25 DIAGNOSIS — J9601 Acute respiratory failure with hypoxia: Secondary | ICD-10-CM | POA: Diagnosis not present

## 2022-08-25 DIAGNOSIS — R000 Tachycardia, unspecified: Secondary | ICD-10-CM | POA: Diagnosis not present

## 2022-08-25 DIAGNOSIS — J449 Chronic obstructive pulmonary disease, unspecified: Secondary | ICD-10-CM | POA: Diagnosis present

## 2022-08-25 DIAGNOSIS — D734 Cyst of spleen: Secondary | ICD-10-CM | POA: Diagnosis not present

## 2022-08-25 DIAGNOSIS — I110 Hypertensive heart disease with heart failure: Secondary | ICD-10-CM | POA: Diagnosis not present

## 2022-08-25 DIAGNOSIS — G2581 Restless legs syndrome: Secondary | ICD-10-CM | POA: Diagnosis not present

## 2022-08-25 DIAGNOSIS — E669 Obesity, unspecified: Secondary | ICD-10-CM | POA: Diagnosis present

## 2022-08-25 DIAGNOSIS — R109 Unspecified abdominal pain: Secondary | ICD-10-CM | POA: Diagnosis not present

## 2022-08-25 DIAGNOSIS — Z7951 Long term (current) use of inhaled steroids: Secondary | ICD-10-CM

## 2022-08-25 DIAGNOSIS — R0600 Dyspnea, unspecified: Secondary | ICD-10-CM | POA: Diagnosis not present

## 2022-08-25 DIAGNOSIS — K838 Other specified diseases of biliary tract: Secondary | ICD-10-CM | POA: Diagnosis not present

## 2022-08-25 DIAGNOSIS — I509 Heart failure, unspecified: Secondary | ICD-10-CM | POA: Diagnosis not present

## 2022-08-25 DIAGNOSIS — I10 Essential (primary) hypertension: Secondary | ICD-10-CM | POA: Diagnosis not present

## 2022-08-25 DIAGNOSIS — G300 Alzheimer's disease with early onset: Secondary | ICD-10-CM | POA: Diagnosis not present

## 2022-08-25 DIAGNOSIS — R1311 Dysphagia, oral phase: Secondary | ICD-10-CM | POA: Diagnosis not present

## 2022-08-25 DIAGNOSIS — M419 Scoliosis, unspecified: Secondary | ICD-10-CM | POA: Diagnosis not present

## 2022-08-25 DIAGNOSIS — J90 Pleural effusion, not elsewhere classified: Secondary | ICD-10-CM | POA: Diagnosis not present

## 2022-08-25 DIAGNOSIS — J811 Chronic pulmonary edema: Secondary | ICD-10-CM | POA: Diagnosis not present

## 2022-08-25 DIAGNOSIS — G9341 Metabolic encephalopathy: Secondary | ICD-10-CM | POA: Diagnosis not present

## 2022-08-25 DIAGNOSIS — E785 Hyperlipidemia, unspecified: Secondary | ICD-10-CM | POA: Diagnosis not present

## 2022-08-25 DIAGNOSIS — R918 Other nonspecific abnormal finding of lung field: Secondary | ICD-10-CM | POA: Diagnosis not present

## 2022-08-25 DIAGNOSIS — Z7901 Long term (current) use of anticoagulants: Secondary | ICD-10-CM

## 2022-08-25 DIAGNOSIS — T502X5A Adverse effect of carbonic-anhydrase inhibitors, benzothiadiazides and other diuretics, initial encounter: Secondary | ICD-10-CM | POA: Diagnosis not present

## 2022-08-25 DIAGNOSIS — I5032 Chronic diastolic (congestive) heart failure: Secondary | ICD-10-CM | POA: Diagnosis not present

## 2022-08-25 DIAGNOSIS — R2689 Other abnormalities of gait and mobility: Secondary | ICD-10-CM | POA: Diagnosis not present

## 2022-08-25 DIAGNOSIS — Z9359 Other cystostomy status: Secondary | ICD-10-CM | POA: Diagnosis not present

## 2022-08-25 LAB — CBC WITH DIFFERENTIAL/PLATELET
Abs Immature Granulocytes: 0.12 10*3/uL — ABNORMAL HIGH (ref 0.00–0.07)
Basophils Absolute: 0.1 10*3/uL (ref 0.0–0.1)
Basophils Relative: 0 %
Eosinophils Absolute: 0.2 10*3/uL (ref 0.0–0.5)
Eosinophils Relative: 1 %
HCT: 40.1 % (ref 36.0–46.0)
Hemoglobin: 13.5 g/dL (ref 12.0–15.0)
Immature Granulocytes: 1 %
Lymphocytes Relative: 5 %
Lymphs Abs: 1.2 10*3/uL (ref 0.7–4.0)
MCH: 29.8 pg (ref 26.0–34.0)
MCHC: 33.7 g/dL (ref 30.0–36.0)
MCV: 88.5 fL (ref 80.0–100.0)
Monocytes Absolute: 1 10*3/uL (ref 0.1–1.0)
Monocytes Relative: 5 %
Neutro Abs: 20.3 10*3/uL — ABNORMAL HIGH (ref 1.7–7.7)
Neutrophils Relative %: 88 %
Platelets: 256 10*3/uL (ref 150–400)
RBC: 4.53 MIL/uL (ref 3.87–5.11)
RDW: 13.8 % (ref 11.5–15.5)
WBC: 22.9 10*3/uL — ABNORMAL HIGH (ref 4.0–10.5)
nRBC: 0 % (ref 0.0–0.2)

## 2022-08-25 LAB — URINALYSIS, ROUTINE W REFLEX MICROSCOPIC
Bilirubin Urine: NEGATIVE
Glucose, UA: NEGATIVE mg/dL
Hgb urine dipstick: NEGATIVE
Ketones, ur: NEGATIVE mg/dL
Nitrite: NEGATIVE
Protein, ur: NEGATIVE mg/dL
Specific Gravity, Urine: 1.008 (ref 1.005–1.030)
pH: 7 (ref 5.0–8.0)

## 2022-08-25 LAB — COMPREHENSIVE METABOLIC PANEL
ALT: 19 U/L (ref 0–44)
AST: 26 U/L (ref 15–41)
Albumin: 3.8 g/dL (ref 3.5–5.0)
Alkaline Phosphatase: 98 U/L (ref 38–126)
Anion gap: 10 (ref 5–15)
BUN: 14 mg/dL (ref 8–23)
CO2: 25 mmol/L (ref 22–32)
Calcium: 8.9 mg/dL (ref 8.9–10.3)
Chloride: 100 mmol/L (ref 98–111)
Creatinine, Ser: 0.91 mg/dL (ref 0.44–1.00)
GFR, Estimated: >60 mL/min (ref >60)
Glucose, Bld: 131 mg/dL — ABNORMAL HIGH (ref 70–99)
Potassium: 3.4 mmol/L — ABNORMAL LOW (ref 3.5–5.1)
Sodium: 135 mmol/L (ref 135–145)
Total Bilirubin: 0.5 mg/dL (ref 0.3–1.2)
Total Protein: 7.7 g/dL (ref 6.5–8.1)

## 2022-08-25 LAB — PROTIME-INR
INR: 1.1 (ref 0.8–1.2)
Prothrombin Time: 14.3 seconds (ref 11.4–15.2)

## 2022-08-25 LAB — LACTIC ACID, PLASMA
Lactic Acid, Venous: 2.8 mmol/L (ref 0.5–1.9)
Lactic Acid, Venous: 2.5 mmol/L (ref 0.5–1.9)

## 2022-08-25 LAB — RESP PANEL BY RT-PCR (RSV, FLU A&B, COVID)  RVPGX2
Influenza A by PCR: NEGATIVE
Influenza B by PCR: NEGATIVE
Resp Syncytial Virus by PCR: NEGATIVE
SARS Coronavirus 2 by RT PCR: NEGATIVE

## 2022-08-25 LAB — LIPASE, BLOOD: Lipase: 32 U/L (ref 11–51)

## 2022-08-25 MED ORDER — ACETAMINOPHEN 500 MG PO TABS
1000.0000 mg | ORAL_TABLET | Freq: Once | ORAL | Status: AC
  Administered 2022-08-25: 1000 mg via ORAL
  Filled 2022-08-25: qty 2

## 2022-08-25 MED ORDER — IOHEXOL 300 MG/ML  SOLN
100.0000 mL | Freq: Once | INTRAMUSCULAR | Status: AC | PRN
  Administered 2022-08-25: 100 mL via INTRAVENOUS

## 2022-08-25 MED ORDER — SODIUM CHLORIDE 0.9 % IV SOLN
2.0000 g | Freq: Once | INTRAVENOUS | Status: AC
  Administered 2022-08-25: 2 g via INTRAVENOUS
  Filled 2022-08-25: qty 12.5

## 2022-08-25 MED ORDER — IBUPROFEN 800 MG PO TABS
800.0000 mg | ORAL_TABLET | Freq: Once | ORAL | Status: AC
  Administered 2022-08-25: 800 mg via ORAL
  Filled 2022-08-25: qty 1

## 2022-08-25 MED ORDER — LACTATED RINGERS IV BOLUS
1000.0000 mL | Freq: Once | INTRAVENOUS | Status: AC
  Administered 2022-08-26: 1000 mL via INTRAVENOUS

## 2022-08-25 MED ORDER — LACTATED RINGERS IV BOLUS (SEPSIS)
1000.0000 mL | Freq: Once | INTRAVENOUS | Status: AC
  Administered 2022-08-25: 1000 mL via INTRAVENOUS

## 2022-08-25 MED ORDER — ACETAMINOPHEN 500 MG PO TABS
1000.0000 mg | ORAL_TABLET | Freq: Once | ORAL | Status: AC
  Administered 2022-08-26: 1000 mg via ORAL
  Filled 2022-08-25: qty 2

## 2022-08-25 MED ORDER — METRONIDAZOLE 500 MG/100ML IV SOLN
500.0000 mg | Freq: Once | INTRAVENOUS | Status: AC
  Administered 2022-08-26: 500 mg via INTRAVENOUS
  Filled 2022-08-25: qty 100

## 2022-08-25 NOTE — ED Notes (Signed)
Date and time results received: 08/25/22 2069 (use smartphrase ".now" to insert current time)  Test: lactic acid Critical Value: 2.8  Name of Provider Notified: J. Gilford Raid, MD

## 2022-08-25 NOTE — ED Notes (Signed)
Date and time results received: 08/25/22 2226 (use smartphrase ".now" to insert current time)  Test: lactic acid Critical Value: 2.5  Name of Provider Notified: J. Gilford Raid, MD

## 2022-08-25 NOTE — ED Triage Notes (Signed)
  Patient BIB son from facility for fever and emesis.  Son states that he was notified by staff that she was running a fever and they were going to give her tylenol.  Son came to get her and bring her to ED, noticed she was more altered than usual.  Has hx of dementia.  Fever 104 in triage.  SPO2 95%.

## 2022-08-25 NOTE — Progress Notes (Signed)
PHARMACY NOTE:  ANTIMICROBIAL RENAL DOSAGE ADJUSTMENT  Current antimicrobial regimen includes a mismatch between antimicrobial dosage and estimated renal function.  As per policy approved by the Pharmacy & Therapeutics and Medical Executive Committees, the antimicrobial dosage will be adjusted accordingly.  Current antimicrobial dosage:  Cefepime 1gm IV x 1  Indication: intra-abdominal infection  Renal Function:  Estimated Creatinine Clearance: 63.2 mL/min (by C-G formula based on SCr of 0.91 mg/dL).      Antimicrobial dosage has been changed to:  Cefepime 2gm IV x 1  Thank you for allowing pharmacy to be a part of this patient's care.  Everette Rank, [facility] 08/25/2022 10:53 PM

## 2022-08-25 NOTE — ED Provider Notes (Signed)
[facility] EMERGENCY DEPARTMENT Provider Note   CSN: [id number] Arrival date & time: 08/25/22  1926     History  Chief Complaint  Patient presents with   Fever   Emesis    Kathleen Valenzuela is a 71 y.o. female.  Pt is a 71 yo female with pmhx significant for hld, bipolar d/o, gerd, anxiety, copd, htn, chronic pain dementia, and chf.  Pt is brought to the ED today due to fever and emesis.  Pt is unable to give any hx.  Pt's son said she is not acting her normal self.         Home Medications Prior to Admission medications   Medication Sig Start Date End Date Taking? Authorizing Provider  albuterol (PROVENTIL HFA;VENTOLIN HFA) 108 (90 BASE) MCG/ACT inhaler Inhale 2 puffs into the lungs 4 (four) times daily. (0800, 1200, 1600, & 2000)    [provider]  ALPRAZolam (XANAX) 1 MG tablet Take 0.5 tablets (0.5 mg total) by mouth 3 (three) times daily as needed for anxiety. (0800, 1400, & 2000) 04/24/17   Rosita Fire, MD  apixaban (ELIQUIS) 5 MG TABS tablet Take 1 tablet (5 mg total) by mouth 2 (two) times daily. 04/15/20   Orson Eva, MD  atorvastatin (LIPITOR) 10 MG tablet Take 1 tablet (10 mg total) by mouth daily at 8 pm. Resume on 04/22/20 if LFTs are back to normal 04/15/20   Tat, David, MD  benztropine (COGENTIN) 1 MG tablet Take 1 mg by mouth 2 (two) times daily. 04/10/20   [provider]  buPROPion (WELLBUTRIN XL) 150 MG 24 hr tablet Take 150 mg by mouth every morning. 03/11/20   [provider]  calcium carbonate (TUMS - DOSED IN MG ELEMENTAL CALCIUM) 500 MG chewable tablet Chew 2 tablets by mouth every 2 (two) hours as needed for indigestion (CHEW BEFORE SWALLOWING).    [provider]  cefdinir (OMNICEF) 300 MG capsule Take 1 capsule (300 mg total) by mouth every 12 (twelve) hours. 04/15/20   Orson Eva, MD  donepezil (ARICEPT) 10 MG tablet Take 10 mg by mouth daily at 8 pm.    [provider]  Ferrous Gluconate 324 (37.5 Fe) MG  TABS Take 324 mg by mouth 2 (two) times daily. (0800 & 2000)    [provider]  fluticasone (FLONASE) 50 MCG/ACT nasal spray Place 1 spray into both nostrils daily. (0800)    [provider]  Fluticasone Furoate-Vilanterol (BREO ELLIPTA) 200-25 MCG/INH AEPB Inhale 1 puff into the lungs daily. (0800)    [provider]  furosemide (LASIX) 40 MG tablet Take 40 mg by mouth daily. (0800)    [provider]  gabapentin (NEURONTIN) 300 MG capsule Take 300 mg by mouth 3 (three) times daily. 11/14/19   [provider]  isosorbide mononitrate (IMDUR) 30 MG 24 hr tablet Take 30 mg by mouth daily. (0800)    [provider]  LATUDA 80 MG TABS tablet Take 80 mg by mouth at bedtime. 11/26/19   [provider]  memantine (NAMENDA) 10 MG tablet Take 10 mg by mouth 2 (two) times daily. 04/10/20   [provider]  Menthol-Zinc Oxide (RISAMINE) 0.44-20.625 % OINT Apply 1 application topically 4 (four) times daily as needed (for rash/redness).    [provider]  metoprolol succinate (TOPROL-XL) 50 MG 24 hr tablet Take 50 mg by mouth daily. (0800)Take with or immediately following a meal.    [provider]  Nystatin (GERHARDT'S  BUTT CREAM) CREA Apply 1 application topically daily. 04/15/20   Orson Eva, MD  Olopatadine HCl (PATADAY) 0.2 % SOLN Place 1 drop into both eyes daily. (0800)    [provider]  ondansetron (ZOFRAN) 4 MG tablet Take 4 mg by mouth every 8 (eight) hours as needed for nausea/vomiting. 04/10/20   [provider]  Oxycodone HCl 10 MG TABS Take 1 tablet (10 mg total) by mouth every 8 (eight) hours as needed (FOR Pain). 04/15/20   Orson Eva, MD  pantoprazole (PROTONIX) 40 MG tablet Take 1 tablet (40 mg total) by mouth 2 (two) times daily before a meal. 02/17/17   Rehman, Mechele Dawley, MD  potassium chloride (KLOR-CON) 10 MEQ tablet Take 1 tablet by mouth daily. 04/10/20   [provider]   tiZANidine (ZANAFLEX) 2 MG tablet Take 2 mg by mouth 2 (two) times daily. (0800 & 2000) 12/01/16   [provider]  traZODone (DESYREL) 100 MG tablet Take 100 mg by mouth at bedtime. 04/10/20   [provider]  venlafaxine XR (EFFEXOR-XR) 150 MG 24 hr capsule Take 300 mg by mouth daily. 04/10/20   [provider]      Allergies    Penicillins    Review of Systems   Review of Systems  Constitutional:  Positive for fever.  All other systems reviewed and are negative.   Physical Exam Updated Vital Signs BP 116/71 (BP Location: Right Arm)   Pulse (!) 115   Temp (!) 104.2 F (40.1 C) (Rectal)   Resp 19   Ht '5\' 7"'$  (1.702 m)   Wt 81.6 kg   SpO2 95%   BMI 28.19 kg/m  Physical Exam Vitals and nursing note reviewed.  Constitutional:      Appearance: She is ill-appearing.  HENT:     Head: Normocephalic and atraumatic.     Right Ear: External ear normal.     Left Ear: External ear normal.     Nose: Nose normal.     Mouth/Throat:     Mouth: Mucous membranes are dry.     Comments: Oral thrush Eyes:     Extraocular Movements: Extraocular movements intact.     Conjunctiva/sclera: Conjunctivae normal.     Pupils: Pupils are equal, round, and reactive to light.  Cardiovascular:     Rate and Rhythm: Regular rhythm. Tachycardia present.     Pulses: Normal pulses.     Heart sounds: Normal heart sounds.  Pulmonary:     Effort: Pulmonary effort is normal.     Breath sounds: Normal breath sounds.  Abdominal:     General: Abdomen is flat. Bowel sounds are normal.     Palpations: Abdomen is soft.  Musculoskeletal:        General: Normal range of motion.     Cervical back: Normal range of motion and neck supple.  Skin:    General: Skin is warm.     Capillary Refill: Capillary refill takes less than 2 seconds.  Neurological:     Mental Status: She is alert. She is disoriented.  Psychiatric:     Comments: Unable to assess     ED Results / Procedures /  Treatments   Labs (all labs ordered are listed, but only abnormal results are displayed) Labs Reviewed  LACTIC ACID, PLASMA - Abnormal; Notable for the following components:      Result Value   Lactic Acid, Venous 2.8 (*)    All other components within normal limits  LACTIC ACID,  PLASMA - Abnormal; Notable for the following components:   Lactic Acid, Venous 2.5 (*)    All other components within normal limits  COMPREHENSIVE METABOLIC PANEL - Abnormal; Notable for the following components:   Potassium 3.4 (*)    Glucose, Bld 131 (*)    All other components within normal limits  CBC WITH DIFFERENTIAL/PLATELET - Abnormal; Notable for the following components:   WBC 22.9 (*)    Neutro Abs 20.3 (*)    Abs Immature Granulocytes 0.12 (*)    All other components within normal limits  URINALYSIS, ROUTINE W REFLEX MICROSCOPIC - Abnormal; Notable for the following components:   Color, Urine STRAW (*)    Leukocytes,Ua TRACE (*)    Bacteria, UA RARE (*)    All other components within normal limits  CULTURE, BLOOD (ROUTINE X 2)  CULTURE, BLOOD (ROUTINE X 2)  RESP PANEL BY RT-PCR (RSV, FLU A&B, COVID)  RVPGX2  URINE CULTURE  PROTIME-INR  LIPASE, BLOOD    EKG EKG Interpretation  Date/Time:  Thursday August 25 2022 21:01:53 EST Ventricular Rate:  116 PR Interval:  35 QRS Duration: 143 QT Interval:  333 QTC Calculation: 463 R Axis:   -26 Text Interpretation: Sinus tachycardia LAE, consider biatrial enlargement Left bundle branch block Artifact in lead(s) I II aVR aVL Since last tracing rate faster Confirmed by Isla Pence [phone]) on 08/25/2022 9:23:41 PM  Radiology CT CHEST ABDOMEN PELVIS W CONTRAST  Result Date: 08/25/2022 CLINICAL DATA:  Sepsis.  Fever. EXAM: CT CHEST, ABDOMEN, AND PELVIS WITH CONTRAST TECHNIQUE: Multidetector CT imaging of the chest, abdomen and pelvis was performed following the standard protocol during bolus administration of intravenous contrast. RADIATION  DOSE REDUCTION: This exam was performed according to the departmental dose-optimization program which includes automated exposure control, adjustment of the mA and/or kV according to patient size and/or use of iterative reconstruction technique. CONTRAST:  124m OMNIPAQUE IOHEXOL 300 MG/ML  SOLN COMPARISON:  CT abdomen and pelvis 12/09/2019. Report only CT chest abdomen and pelvis 05/03/2017. FINDINGS: CT CHEST FINDINGS Cardiovascular: No significant vascular findings. Normal heart size. No pericardial effusion. There are atherosclerotic calcifications of the aorta. Mediastinum/Nodes: No enlarged mediastinal, hilar, or axillary lymph nodes. Thyroid gland, trachea, and esophagus demonstrate no significant findings. Lungs/Pleura: Patchy nodular airspace opacity seen in the right upper lobe centrally image 3/50 measuring 6 mm. There is a 5 mm nodule in the left lower lobe image 3/40. There is minimal atelectasis or scarring in the right lung base. The lungs are otherwise clear. There is no pleural effusion or pneumothorax. Musculoskeletal: No chest wall mass or suspicious bone lesions identified. CT ABDOMEN PELVIS FINDINGS Hepatobiliary: The gallbladder is dilated and there is gallbladder wall thickening. There is intra and extrahepatic biliary ductal dilatation. The common bile duct measures 15 mm. There is mild inflammatory stranding surrounding the gallbladder. No focal liver lesions are seen. Pancreas: Unremarkable. No pancreatic ductal dilatation or surrounding inflammatory changes. Spleen: There is a 12 mm splenic cyst or hemangioma which is unchanged. The spleen is nonenlarged. Adrenals/Urinary Tract: There are subcentimeter cortical hypodensities in both kidneys favored as cysts. There is no hydronephrosis or perinephric fluid. The adrenal glands and bladder are within normal limits. Stomach/Bowel: There is no evidence for bowel obstruction, pneumatosis, free air or inflammatory stranding. Again seen is sigmoid  colon diverticulosis. There is questionable sigmoid colon wall thickening versus normal under distension. Stomach, small bowel and appendix are within normal limits. Vascular/Lymphatic: Aortic atherosclerosis. No enlarged abdominal or pelvic lymph nodes.  Reproductive: Uterus and bilateral adnexa are unremarkable. Other: No abdominal wall hernia or abnormality. No abdominopelvic ascites. Musculoskeletal: Degenerative changes affect the spine. IMPRESSION: 1. Findings compatible with acute cholecystitis. There is intra and extrahepatic biliary ductal dilatation. 2. Questionable sigmoid colon wall thickening versus normal under distension. Correlate clinically for colitis. Follow-up colonoscopy should be considered. 3. Sigmoid colon diverticulosis. 4. Patchy nodular airspace opacity in the right upper lobe measuring 6 mm and 5 mm nodule in the left lower lobe. Non-contrast chest CT at 3-6 months is recommended. If the nodules are stable at time of repeat CT, then future CT at 18-24 months (from today's scan) is considered optional for low-risk patients, but is recommended for high-risk patients. This recommendation follows the consensus statement: Guidelines for Management of Incidental Pulmonary Nodules Detected on CT Images: From the Fleischner Society 2017; Radiology 2017; 284:228-243. Aortic Atherosclerosis (ICD10-I70.0). Electronically Signed   By: Ronney Asters M.D.   On: 08/25/2022 22:42   DG Chest Port 1 View  Result Date: 08/25/2022 CLINICAL DATA:  Sepsis EXAM: PORTABLE CHEST 1 VIEW COMPARISON:  04/03/2022 FINDINGS: Mild right basilar scarring/atelectasis. Left lung is clear No pleural effusion or pneumothorax. The heart normal in size. Thoracic aortic atherosclerosis. IMPRESSION: No evidence of acute cardiopulmonary disease. Electronically Signed   By: Julian Hy M.D.   On: 08/25/2022 20:30    Procedures Procedures    Medications Ordered in ED Medications  ceFEPIme (MAXIPIME) 2 g in sodium  chloride 0.9 % 100 mL IVPB (2 g Intravenous New Bag/Given 08/25/22 2328)    And  metroNIDAZOLE (FLAGYL) IVPB 500 mg (has no administration in time range)  acetaminophen (TYLENOL) tablet 1,000 mg (has no administration in time range)  lactated ringers bolus 1,000 mL (has no administration in time range)  lactated ringers bolus 1,000 mL (0 mLs Intravenous Stopped 08/25/22 2211)  acetaminophen (TYLENOL) tablet 1,000 mg (1,000 mg Oral Given 08/25/22 2042)  ibuprofen (ADVIL) tablet 800 mg (800 mg Oral Given 08/25/22 2211)  iohexol (OMNIPAQUE) 300 MG/ML solution 100 mL (100 mLs Intravenous Contrast Given 08/25/22 2227)    ED Course/ Medical Decision Making/ A&P                           Medical Decision Making Amount and/or Complexity of Data Reviewed Labs: ordered. Radiology: ordered. ECG/medicine tests: ordered.  Risk OTC drugs. Prescription drug management. Decision regarding hospitalization.   This patient presents to the ED for concern of fever, this involves an extensive number of treatment options, and is a complaint that carries with it a high risk of complications and morbidity.  The differential diagnosis includes sepsis, infection, covid/flu   Co morbidities that complicate the patient evaluation  hld, bipolar d/o, gerd, anxiety, copd, htn, chronic pain dementia, and chf   Additional history obtained:  Additional history obtained from epic chart review External records from outside source obtained and reviewed including son   Lab Tests:  I Ordered, and personally interpreted labs.  The pertinent results include:  wbc elevated at 22.9; cmp nl; lactic elevated at 2.8 which has come down to 2.5 after fluids   Imaging Studies ordered:  I ordered imaging studies including cxr and ct abd/pelvis I independently visualized and interpreted imaging which showed  CXR: No evidence of acute cardiopulmonary disease.  CT abd/pelvis: . Findings compatible with acute  cholecystitis. There is intra and  extrahepatic biliary ductal dilatation.  2. Questionable sigmoid colon wall thickening versus normal under  distension. Correlate clinically for colitis. Follow-up colonoscopy  should be considered.  3. Sigmoid colon diverticulosis.  4. Patchy nodular airspace opacity in the right upper lobe measuring  6 mm and 5 mm nodule in the left lower lobe.   I agree with the radiologist interpretation   Cardiac Monitoring:  The patient was maintained on a cardiac monitor.  I personally viewed and interpreted the cardiac monitored which showed an underlying rhythm of: sinus tachy   Medicines ordered and prescription drug management:  I ordered medication including ivfs  for dehydration; tylenol/ibuprofen for fever  Reevaluation of the patient after these medicines showed that the patient improved I have reviewed the patients home medicines and have made adjustments as needed   Test Considered:  ct   Critical Interventions:  abx   Consultations Obtained:  I requested consultation with the surgeon (Dr. Arnoldo Morale),  and discussed lab and imaging findings as well as pertinent plan - he recommends admission to medicine with npo after midnight.  He also recommends a gb US in the am. Pt d/w Dr. Clearence Ped (triad) for admission   Problem List / ED Course:  Sepsis:  Likely due to acute cholecystitis.  Pt given ivfs and iv abx.  She looks much better now than she did when she arrived.   Reevaluation:  After the interventions noted above, I reevaluated the patient and found that they have :improved   Social Determinants of Health:  Lives in a snf   Dispostion:  After consideration of the diagnostic results and the patients response to treatment, I feel that the patent would benefit from admission.    CRITICAL CARE Performed by: Isla Pence   Total critical care time: 30 minutes  Critical care time was exclusive of separately billable  procedures and treating other patients.  Critical care was necessary to treat or prevent imminent or life-threatening deterioration.  Critical care was time spent personally by me on the following activities: development of treatment plan with patient and/or surrogate as well as nursing, discussions with consultants, evaluation of patient's response to treatment, examination of patient, obtaining history from patient or surrogate, ordering and performing treatments and interventions, ordering and review of laboratory studies, ordering and review of radiographic studies, pulse oximetry and re-evaluation of patient's condition.         Final Clinical Impression(s) / ED Diagnoses Final diagnoses:  Acute cholecystitis    Rx / DC Orders ED Discharge Orders     None         Isla Pence, MD 08/25/22 2334

## 2022-08-26 ENCOUNTER — Inpatient Hospital Stay (HOSPITAL_COMMUNITY): Payer: Medicare Other

## 2022-08-26 ENCOUNTER — Encounter (HOSPITAL_COMMUNITY): Payer: Self-pay | Admitting: Family Medicine

## 2022-08-26 DIAGNOSIS — I482 Chronic atrial fibrillation, unspecified: Secondary | ICD-10-CM | POA: Diagnosis present

## 2022-08-26 DIAGNOSIS — K810 Acute cholecystitis: Secondary | ICD-10-CM | POA: Diagnosis not present

## 2022-08-26 DIAGNOSIS — I5032 Chronic diastolic (congestive) heart failure: Secondary | ICD-10-CM

## 2022-08-26 DIAGNOSIS — R6520 Severe sepsis without septic shock: Secondary | ICD-10-CM

## 2022-08-26 DIAGNOSIS — E7800 Pure hypercholesterolemia, unspecified: Secondary | ICD-10-CM

## 2022-08-26 DIAGNOSIS — Z0189 Encounter for other specified special examinations: Secondary | ICD-10-CM

## 2022-08-26 DIAGNOSIS — E876 Hypokalemia: Secondary | ICD-10-CM | POA: Diagnosis not present

## 2022-08-26 DIAGNOSIS — F319 Bipolar disorder, unspecified: Secondary | ICD-10-CM | POA: Diagnosis not present

## 2022-08-26 DIAGNOSIS — A419 Sepsis, unspecified organism: Secondary | ICD-10-CM | POA: Diagnosis not present

## 2022-08-26 DIAGNOSIS — F03918 Unspecified dementia, unspecified severity, with other behavioral disturbance: Secondary | ICD-10-CM | POA: Diagnosis present

## 2022-08-26 DIAGNOSIS — G9341 Metabolic encephalopathy: Secondary | ICD-10-CM | POA: Diagnosis not present

## 2022-08-26 DIAGNOSIS — K219 Gastro-esophageal reflux disease without esophagitis: Secondary | ICD-10-CM

## 2022-08-26 LAB — CBC WITH DIFFERENTIAL/PLATELET
Abs Immature Granulocytes: 0.13 10*3/uL — ABNORMAL HIGH (ref 0.00–0.07)
Basophils Absolute: 0.1 10*3/uL (ref 0.0–0.1)
Basophils Relative: 0 %
Eosinophils Absolute: 0 10*3/uL (ref 0.0–0.5)
Eosinophils Relative: 0 %
HCT: 35.6 % — ABNORMAL LOW (ref 36.0–46.0)
Hemoglobin: 11.7 g/dL — ABNORMAL LOW (ref 12.0–15.0)
Immature Granulocytes: 1 %
Lymphocytes Relative: 4 %
Lymphs Abs: 0.8 10*3/uL (ref 0.7–4.0)
MCH: 30.1 pg (ref 26.0–34.0)
MCHC: 32.9 g/dL (ref 30.0–36.0)
MCV: 91.5 fL (ref 80.0–100.0)
Monocytes Absolute: 1.3 10*3/uL — ABNORMAL HIGH (ref 0.1–1.0)
Monocytes Relative: 6 %
Neutro Abs: 18.4 10*3/uL — ABNORMAL HIGH (ref 1.7–7.7)
Neutrophils Relative %: 89 %
Platelets: 188 10*3/uL (ref 150–400)
RBC: 3.89 MIL/uL (ref 3.87–5.11)
RDW: 14.1 % (ref 11.5–15.5)
WBC: 20.8 10*3/uL — ABNORMAL HIGH (ref 4.0–10.5)
nRBC: 0 % (ref 0.0–0.2)

## 2022-08-26 LAB — ECHOCARDIOGRAM COMPLETE
AR max vel: 1.19 cm2
AV Area VTI: 1.24 cm2
AV Area mean vel: 1.16 cm2
AV Mean grad: 16 mmHg
AV Peak grad: 24.7 mmHg
Ao pk vel: 2.49 m/s
Area-P 1/2: 1.73 cm2
Calc EF: 56.8 %
Height: 67 in
MV VTI: 0.81 cm2
P 1/2 time: 508 msec
S' Lateral: 2.9 cm
Single Plane A2C EF: 55.8 %
Single Plane A4C EF: 55.6 %
Weight: 2880 oz

## 2022-08-26 LAB — COMPREHENSIVE METABOLIC PANEL
ALT: 15 U/L (ref 0–44)
AST: 24 U/L (ref 15–41)
Albumin: 2.9 g/dL — ABNORMAL LOW (ref 3.5–5.0)
Alkaline Phosphatase: 71 U/L (ref 38–126)
Anion gap: 8 (ref 5–15)
BUN: 15 mg/dL (ref 8–23)
CO2: 26 mmol/L (ref 22–32)
Calcium: 8.1 mg/dL — ABNORMAL LOW (ref 8.9–10.3)
Chloride: 104 mmol/L (ref 98–111)
Creatinine, Ser: 1 mg/dL (ref 0.44–1.00)
GFR, Estimated: >60 mL/min (ref >60)
Glucose, Bld: 119 mg/dL — ABNORMAL HIGH (ref 70–99)
Potassium: 3.5 mmol/L (ref 3.5–5.1)
Sodium: 138 mmol/L (ref 135–145)
Total Bilirubin: 0.4 mg/dL (ref 0.3–1.2)
Total Protein: 6 g/dL — ABNORMAL LOW (ref 6.5–8.1)

## 2022-08-26 LAB — BASIC METABOLIC PANEL
Anion gap: 5 (ref 5–15)
BUN: 16 mg/dL (ref 8–23)
CO2: 25 mmol/L (ref 22–32)
Calcium: 7.7 mg/dL — ABNORMAL LOW (ref 8.9–10.3)
Chloride: 109 mmol/L (ref 98–111)
Creatinine, Ser: 1.04 mg/dL — ABNORMAL HIGH (ref 0.44–1.00)
GFR, Estimated: 58 mL/min — ABNORMAL LOW (ref >60)
Glucose, Bld: 113 mg/dL — ABNORMAL HIGH (ref 70–99)
Potassium: 3.8 mmol/L (ref 3.5–5.1)
Sodium: 139 mmol/L (ref 135–145)

## 2022-08-26 LAB — LACTIC ACID, PLASMA
Lactic Acid, Venous: 2.3 mmol/L (ref 0.5–1.9)
Lactic Acid, Venous: 1.5 mmol/L (ref 0.5–1.9)

## 2022-08-26 LAB — HIV ANTIBODY (ROUTINE TESTING W REFLEX): HIV Screen 4th Generation wRfx: NONREACTIVE

## 2022-08-26 LAB — MAGNESIUM: Magnesium: 1.4 mg/dL — ABNORMAL LOW (ref 1.7–2.4)

## 2022-08-26 MED ORDER — MAGNESIUM SULFATE 4 GM/100ML IV SOLN
4.0000 g | Freq: Once | INTRAVENOUS | Status: AC
  Administered 2022-08-26: 4 g via INTRAVENOUS
  Filled 2022-08-26: qty 100

## 2022-08-26 MED ORDER — ISOSORBIDE MONONITRATE ER 30 MG PO TB24
30.0000 mg | ORAL_TABLET | Freq: Every day | ORAL | Status: DC
  Administered 2022-08-26: 30 mg via ORAL
  Filled 2022-08-26: qty 1

## 2022-08-26 MED ORDER — METRONIDAZOLE 500 MG/100ML IV SOLN
500.0000 mg | Freq: Two times a day (BID) | INTRAVENOUS | Status: DC
  Administered 2022-08-26 – 2022-09-08 (×27): 500 mg via INTRAVENOUS
  Filled 2022-08-26 (×27): qty 100

## 2022-08-26 MED ORDER — GADOBUTROL 1 MMOL/ML IV SOLN
9.0000 mL | Freq: Once | INTRAVENOUS | Status: AC | PRN
  Administered 2022-08-26: 9 mL via INTRAVENOUS

## 2022-08-26 MED ORDER — MORPHINE SULFATE (PF) 2 MG/ML IV SOLN
1.0000 mg | INTRAVENOUS | Status: DC | PRN
  Administered 2022-08-26 (×2): 1 mg via INTRAVENOUS
  Filled 2022-08-26 (×2): qty 1

## 2022-08-26 MED ORDER — BUPROPION HCL ER (XL) 150 MG PO TB24
150.0000 mg | ORAL_TABLET | Freq: Every morning | ORAL | Status: DC
  Administered 2022-08-26 – 2022-09-16 (×21): 150 mg via ORAL
  Filled 2022-08-26 (×21): qty 1

## 2022-08-26 MED ORDER — ALPRAZOLAM 0.5 MG PO TABS
0.5000 mg | ORAL_TABLET | Freq: Three times a day (TID) | ORAL | Status: DC | PRN
  Administered 2022-08-31 – 2022-09-16 (×23): 0.5 mg via ORAL
  Filled 2022-08-26 (×23): qty 1

## 2022-08-26 MED ORDER — BENZTROPINE MESYLATE 1 MG PO TABS
1.0000 mg | ORAL_TABLET | Freq: Two times a day (BID) | ORAL | Status: DC
  Administered 2022-08-26: 1 mg via ORAL
  Filled 2022-08-26: qty 1

## 2022-08-26 MED ORDER — FLUTICASONE FUROATE-VILANTEROL 200-25 MCG/ACT IN AEPB
1.0000 | INHALATION_SPRAY | Freq: Every day | RESPIRATORY_TRACT | Status: DC
  Administered 2022-08-26 – 2022-09-16 (×16): 1 via RESPIRATORY_TRACT
  Filled 2022-08-26 (×3): qty 28

## 2022-08-26 MED ORDER — ATORVASTATIN CALCIUM 40 MG PO TABS
40.0000 mg | ORAL_TABLET | Freq: Every day | ORAL | Status: DC
  Administered 2022-08-26 – 2022-09-15 (×20): 40 mg via ORAL
  Filled 2022-08-26 (×20): qty 1

## 2022-08-26 MED ORDER — MEMANTINE HCL 10 MG PO TABS
10.0000 mg | ORAL_TABLET | Freq: Two times a day (BID) | ORAL | Status: DC
  Administered 2022-08-26 – 2022-09-16 (×41): 10 mg via ORAL
  Filled 2022-08-26 (×44): qty 1

## 2022-08-26 MED ORDER — SODIUM CHLORIDE 0.9 % IV SOLN
2.0000 g | Freq: Two times a day (BID) | INTRAVENOUS | Status: DC
  Administered 2022-08-26 – 2022-08-29 (×6): 2 g via INTRAVENOUS
  Filled 2022-08-26 (×6): qty 12.5

## 2022-08-26 MED ORDER — VENLAFAXINE HCL ER 150 MG PO CP24
300.0000 mg | ORAL_CAPSULE | Freq: Every day | ORAL | Status: DC
  Administered 2022-08-26 – 2022-09-16 (×21): 300 mg via ORAL
  Filled 2022-08-26: qty 4
  Filled 2022-08-26 (×12): qty 2
  Filled 2022-08-26: qty 4
  Filled 2022-08-26: qty 2
  Filled 2022-08-26: qty 8
  Filled 2022-08-26: qty 2
  Filled 2022-08-26: qty 4
  Filled 2022-08-26 (×3): qty 2

## 2022-08-26 MED ORDER — FLUTICASONE PROPIONATE 50 MCG/ACT NA SUSP
1.0000 | Freq: Every day | NASAL | Status: DC
  Administered 2022-08-26 – 2022-09-16 (×18): 1 via NASAL
  Filled 2022-08-26 (×3): qty 16

## 2022-08-26 MED ORDER — ONDANSETRON HCL 4 MG PO TABS: 4.0000 mg | ORAL_TABLET | Freq: Four times a day (QID) | ORAL | Status: DC | PRN

## 2022-08-26 MED ORDER — ISOSORBIDE MONONITRATE ER 30 MG PO TB24
15.0000 mg | ORAL_TABLET | Freq: Every day | ORAL | Status: DC
  Administered 2022-08-27 – 2022-09-16 (×20): 15 mg via ORAL
  Filled 2022-08-26 (×20): qty 1

## 2022-08-26 MED ORDER — TIZANIDINE HCL 4 MG PO TABS
2.0000 mg | ORAL_TABLET | Freq: Two times a day (BID) | ORAL | Status: DC
  Administered 2022-08-26 – 2022-09-16 (×42): 2 mg via ORAL
  Filled 2022-08-26 (×43): qty 1

## 2022-08-26 MED ORDER — TRAZODONE HCL 50 MG PO TABS
100.0000 mg | ORAL_TABLET | Freq: Every day | ORAL | Status: DC
  Administered 2022-08-26 – 2022-09-15 (×21): 100 mg via ORAL
  Filled 2022-08-26 (×7): qty 2
  Filled 2022-08-26: qty 1
  Filled 2022-08-26 (×2): qty 2
  Filled 2022-08-26: qty 1
  Filled 2022-08-26 (×6): qty 2
  Filled 2022-08-26: qty 1
  Filled 2022-08-26: qty 2
  Filled 2022-08-26: qty 1
  Filled 2022-08-26: qty 2
  Filled 2022-08-26: qty 1
  Filled 2022-08-26: qty 2

## 2022-08-26 MED ORDER — OXYCODONE HCL 5 MG PO TABS
5.0000 mg | ORAL_TABLET | ORAL | Status: DC | PRN
  Administered 2022-08-26 – 2022-09-16 (×28): 5 mg via ORAL
  Filled 2022-08-26 (×29): qty 1

## 2022-08-26 MED ORDER — POTASSIUM CHLORIDE 10 MEQ/100ML IV SOLN
10.0000 meq | INTRAVENOUS | Status: AC
  Administered 2022-08-26 (×2): 10 meq via INTRAVENOUS
  Filled 2022-08-26 (×2): qty 100

## 2022-08-26 MED ORDER — DONEPEZIL HCL 10 MG PO TABS
10.0000 mg | ORAL_TABLET | Freq: Every day | ORAL | Status: DC
  Administered 2022-08-26 – 2022-09-15 (×21): 10 mg via ORAL
  Filled 2022-08-26 (×15): qty 1
  Filled 2022-08-26: qty 2
  Filled 2022-08-26 (×7): qty 1

## 2022-08-26 MED ORDER — ONDANSETRON HCL 4 MG/2ML IJ SOLN
4.0000 mg | Freq: Four times a day (QID) | INTRAMUSCULAR | Status: DC | PRN
  Administered 2022-08-26: 4 mg via INTRAVENOUS
  Filled 2022-08-26: qty 2

## 2022-08-26 MED ORDER — ACETAMINOPHEN 650 MG RE SUPP: 650.0000 mg | Freq: Four times a day (QID) | RECTAL | Status: DC | PRN

## 2022-08-26 MED ORDER — MORPHINE SULFATE (PF) 2 MG/ML IV SOLN: 2.0000 mg | INTRAVENOUS | Status: DC | PRN

## 2022-08-26 MED ORDER — PANTOPRAZOLE SODIUM 40 MG PO TBEC
40.0000 mg | DELAYED_RELEASE_TABLET | Freq: Two times a day (BID) | ORAL | Status: DC
  Administered 2022-08-26 – 2022-09-16 (×39): 40 mg via ORAL
  Filled 2022-08-26 (×37): qty 1

## 2022-08-26 MED ORDER — SODIUM CHLORIDE 0.9 % IV BOLUS
500.0000 mL | Freq: Once | INTRAVENOUS | Status: AC
  Administered 2022-08-26: 500 mL via INTRAVENOUS

## 2022-08-26 MED ORDER — GABAPENTIN 300 MG PO CAPS
300.0000 mg | ORAL_CAPSULE | Freq: Three times a day (TID) | ORAL | Status: DC
  Administered 2022-08-26 – 2022-09-16 (×61): 300 mg via ORAL
  Filled 2022-08-26 (×61): qty 1

## 2022-08-26 MED ORDER — METOPROLOL SUCCINATE ER 25 MG PO TB24
12.5000 mg | ORAL_TABLET | Freq: Every day | ORAL | Status: DC
  Administered 2022-08-27 – 2022-09-07 (×11): 12.5 mg via ORAL
  Filled 2022-08-26 (×12): qty 1

## 2022-08-26 MED ORDER — ACETAMINOPHEN 325 MG PO TABS
650.0000 mg | ORAL_TABLET | Freq: Four times a day (QID) | ORAL | Status: DC | PRN
  Administered 2022-08-27 – 2022-09-01 (×3): 650 mg via ORAL
  Filled 2022-08-26 (×3): qty 2

## 2022-08-26 MED ORDER — METOPROLOL SUCCINATE ER 50 MG PO TB24: 50.0000 mg | ORAL_TABLET | Freq: Every day | ORAL | Status: DC

## 2022-08-26 MED ORDER — ALBUTEROL SULFATE HFA 108 (90 BASE) MCG/ACT IN AERS
2.0000 | INHALATION_SPRAY | Freq: Four times a day (QID) | RESPIRATORY_TRACT | Status: DC
  Administered 2022-08-26 (×2): 2 via RESPIRATORY_TRACT
  Filled 2022-08-26: qty 6.7

## 2022-08-26 MED ORDER — ALBUTEROL SULFATE HFA 108 (90 BASE) MCG/ACT IN AERS: 2.0000 | INHALATION_SPRAY | Freq: Three times a day (TID) | RESPIRATORY_TRACT | Status: DC

## 2022-08-26 MED ORDER — SODIUM CHLORIDE 0.9 % IV SOLN
2.0000 g | Freq: Three times a day (TID) | INTRAVENOUS | Status: DC
  Administered 2022-08-26: 2 g via INTRAVENOUS
  Filled 2022-08-26: qty 12.5

## 2022-08-26 MED ORDER — SODIUM CHLORIDE 0.9 % IV SOLN: INTRAVENOUS | Status: DC

## 2022-08-26 MED ORDER — ATORVASTATIN CALCIUM 10 MG PO TABS
10.0000 mg | ORAL_TABLET | Freq: Every day | ORAL | Status: DC
  Administered 2022-08-26: 10 mg via ORAL
  Filled 2022-08-26: qty 1

## 2022-08-26 MED ORDER — TECHNETIUM TC 99M MEBROFENIN IV KIT
5.0000 | PACK | Freq: Once | INTRAVENOUS | Status: AC | PRN
  Administered 2022-08-26: 4 via INTRAVENOUS

## 2022-08-26 MED ORDER — LURASIDONE HCL 40 MG PO TABS
80.0000 mg | ORAL_TABLET | Freq: Every day | ORAL | Status: DC
  Administered 2022-08-26 – 2022-09-15 (×21): 80 mg via ORAL
  Filled 2022-08-26 (×24): qty 2

## 2022-08-26 MED ORDER — BENZTROPINE MESYLATE 0.5 MG PO TABS
0.5000 mg | ORAL_TABLET | Freq: Two times a day (BID) | ORAL | Status: DC
  Administered 2022-08-26 – 2022-09-16 (×41): 0.5 mg via ORAL
  Filled 2022-08-26 (×46): qty 1

## 2022-08-26 NOTE — ED Notes (Signed)
Patient complaining of pain. See MAR for admin

## 2022-08-26 NOTE — ED Notes (Signed)
Patient to MRI at this time.

## 2022-08-26 NOTE — Assessment & Plan Note (Addendum)
-   Leukocytosis 22.9, fever 94.2 - CT chest abdomen pelvis shows acute cholecystitis - No jaundice - Acute metabolic encephalopathy, improved with fluids and antibiotics - Continue cefepime and Flagyl - Surgery to see in the a.m. - Continue pain control with pain scale - N.p.o. ice chips - Holding Eliquis for possible procedure - MRCP and HIDA scan reviewed with Dr Arnoldo Morale, his recommendation for next best step is for cholecystostomy tube placement.  IR requested patient transfer to [facility] for tube placement on 08/27/22.

## 2022-08-26 NOTE — Assessment & Plan Note (Signed)
-   Leukocytosis 22.9, febrile 104.2, tachycardic at 133, tachypneic and respiratory rate 26 - Lactic acid 2.8, and then 2.5 - Acute metabolic encephalopathy - COVID and flu negative, urine culture pending, blood cultures pending - Continue cefepime and Flagyl - Secondary to acute cholecystitis most likely - Continue to monitor

## 2022-08-26 NOTE — Progress Notes (Signed)
IR received request to evaluate patient for percutaneous cholecystostomy. Imaging reviewed and procedure approved by Dr. Kathlene Cote.   IR is unable to do this procedure today and we are unable to accommodate weekend procedures requiring [facility] transfers to/from other hospitals. IR has recommended transfer to [facility] and this procedure can be done tomorrow, 08/27/22.  This information was relayed to the team at [facility]. They will place an order to transfer the patient to [facility].   IR will see the patient either later this afternoon or tomorrow morning for formal consult with the patient.   Soyla Dryer, [place] [phone] 08/26/2022, 1:04 PM

## 2022-08-26 NOTE — Progress Notes (Signed)
  Echocardiogram 2D Echocardiogram has been performed.  Kathleen Valenzuela 08/26/2022, 11:28 AM

## 2022-08-26 NOTE — Assessment & Plan Note (Signed)
-   Continue Namenda and Aricept

## 2022-08-26 NOTE — Assessment & Plan Note (Signed)
-   Holding Eliquis in the setting of possible procedure tomorrow, continue metoprolol - Patient initially quite tachycardic at 133, at admission remains tachycardic at 115 but blood pressure stable and no further need for intervention with a heart rate of 115

## 2022-08-26 NOTE — Assessment & Plan Note (Addendum)
-   Continue atorvastatin, Imdur, metoprolol - Does not clinically appear to be in exacerbation at this time; follow up preoperative TTE

## 2022-08-26 NOTE — Assessment & Plan Note (Signed)
Continue Protonix °

## 2022-08-26 NOTE — ED Notes (Signed)
Patient oxygen 81% on RA; patient placed on 3L/[place] due to drop in O2.

## 2022-08-26 NOTE — Progress Notes (Addendum)
Pharmacy Antibiotic Note  COURTNEE MYER is a 71 y.o. female admitted on 08/25/2022 with  acute cholecystitis .  Pharmacy has been consulted for cefepime dosing.  Plan: Cefepime 2g IV Q8H. Metronidazole ordered appropriately by admitting MD.  Height: '5\' 7"'$  (170.2 cm) Weight: 81.6 kg (180 lb) IBW/kg (Calculated) : 61.6  Temp (24hrs), Avg:104.1 F (40.1 C), Min:104 F (40 C), Max:104.2 F (40.1 C)  Recent Labs  Lab 08/25/22 2018 08/25/22 2200  WBC 22.9*  --   CREATININE 0.91  --   LATICACIDVEN 2.8* 2.5*    Estimated Creatinine Clearance: 63.2 mL/min (by C-G formula based on SCr of 0.91 mg/dL).    Allergies  Allergen Reactions   Penicillins Rash    Has patient had a PCN reaction causing immediate rash, facial/tongue/throat swelling, SOB or lightheadedness with hypotension: Yes Has patient had a PCN reaction causing severe rash involving mucus membranes or skin necrosis: No Has patient had a PCN reaction that required hospitalization: No Has patient had a PCN reaction occurring within the last 10 years: No If all of the above answers are "NO", then may proceed with Cephalosporin use. Ceftriaxone ok    Thank you for allowing pharmacy to be a part of this patient's care.  Wynona Neat, PharmD, BCPS  08/26/2022 12:07 AM

## 2022-08-26 NOTE — ED Notes (Signed)
Patient to nuclear medicine at this time.

## 2022-08-26 NOTE — Consult Note (Addendum)
Reason for Consult: Sepsis, cholecystitis Referring Physician: Dr. Joycelyn Rua Kathleen Valenzuela is an 71 y.o. female.  HPI: Patient is a 71 year old white female with multiple medical problems including bipolar disorder, COPD, GERD, hypertension, chronic anticoagulation with Eliquis for history of atrial fibrillation, and history of anemia who presented to the emergency room with altered mental status.  She was transferred from a skilled nursing facility due to her altered mental status and transferred to the emergency room for further evaluation and treatment.  She underwent a workup and was found to have acute cholecystitis.  She did have some relative hypotension and required multiple fluid boluses.  She does have a history of diastolic dysfunction heart failure.  When I saw her, she complained only of some back pain.  She did have mild discomfort to deep palpation in the right upper quadrant.  No rigidity was noted.  Ultrasound was performed which revealed a distended gallbladder with mild diffuse gallbladder wall thickening and moderate sludge.  She did have a positive Murphy sign.  The findings were equivocal for a calculus cholecystitis.  She was noted to have a dilated common bile duct at 10 mm.  MRCP was just performed which revealed a 4 mm filling defect consistent with either a common bile duct stone or sludge.  HIDA scan was performed which reveals acute cholecystitis as the gallbladder did not fill.  Past Medical History:  Diagnosis Date   Anemia    Anxiety    Aortic atherosclerosis ([place]) 04/21/2017   Arthritis    Bipolar 1 disorder (HCC)    Chronic back pain    COPD (chronic obstructive pulmonary disease) (HCC)    Diastolic dysfunction with heart failure ([place]) 04/20/2017   Duodenal stricture    Early onset Alzheimer's dementia (HCC)    GERD (gastroesophageal reflux disease)    Heart murmur    High cholesterol    Hypertension    Shortness of breath dyspnea     Past Surgical History:   Procedure Laterality Date   BIOPSY  01/06/2017   Procedure: BIOPSY;  Surgeon: Rogene Houston, MD;  [facility];  Service: Endoscopy;;  esophageal biopsy   CESAREAN SECTION     COLONOSCOPY     ESOPHAGEAL DILATION N/A 10/29/2015   Procedure: ESOPHAGEAL DILATION;  Surgeon: Rogene Houston, MD;  [facility];  Service: Endoscopy;  Laterality: N/A;   ESOPHAGEAL DILATION N/A 01/29/2016   Procedure: ESOPHAGEAL DILATION;  Surgeon: Rogene Houston, MD;  [facility];  Service: Endoscopy;  Laterality: N/A;   ESOPHAGEAL DILATION N/A 01/06/2017   Procedure: ESOPHAGEAL DILATION;  Surgeon: Rogene Houston, MD;  [facility];  Service: Endoscopy;  Laterality: N/A;   ESOPHAGOGASTRODUODENOSCOPY N/A 10/29/2015   Procedure: ESOPHAGOGASTRODUODENOSCOPY (EGD);  Surgeon: Rogene Houston, MD;  [facility];  Service: Endoscopy;  Laterality: N/A;  1200   ESOPHAGOGASTRODUODENOSCOPY (EGD) WITH PROPOFOL N/A 01/29/2016   Procedure: ESOPHAGOGASTRODUODENOSCOPY (EGD) WITH PROPOFOL;  Surgeon: Rogene Houston, MD;  [facility];  Service: Endoscopy;  Laterality: N/A;  7:30 - moved to 4/21 '@11'$ : 25 - Ann notified pt to arrive at 10:00   ESOPHAGOGASTRODUODENOSCOPY (EGD) WITH PROPOFOL N/A 01/06/2017   Procedure: ESOPHAGOGASTRODUODENOSCOPY (EGD) WITH PROPOFOL;  Surgeon: Rogene Houston, MD;  [facility];  Service: Endoscopy;  Laterality: N/A;  11:20   ESOPHAGOGASTRODUODENOSCOPY (EGD) WITH PROPOFOL N/A 02/17/2017   Procedure: ESOPHAGOGASTRODUODENOSCOPY (EGD) WITH PROPOFOL;  Surgeon: Rogene Houston, MD;  [facility];  Service: Endoscopy;  Laterality: N/A;  10:30   FOOT SURGERY Right    bunionectomy   HEMORRHOID SURGERY      History reviewed. No pertinent family history.  Social History:  reports that she quit smoking about 7 years ago. Her smoking use included cigarettes. She has a 70.00 pack-year smoking history. She has never used smokeless  tobacco. She reports that she does not drink alcohol and does not use drugs.  Allergies:  Allergies  Allergen Reactions   Penicillins Rash    Has patient had a PCN reaction causing immediate rash, facial/tongue/throat swelling, SOB or lightheadedness with hypotension: Yes Has patient had a PCN reaction causing severe rash involving mucus membranes or skin necrosis: No Has patient had a PCN reaction that required hospitalization: No Has patient had a PCN reaction occurring within the last 10 years: No If all of the above answers are "NO", then may proceed with Cephalosporin use. Ceftriaxone ok    Medications: I have reviewed the patient's current medications.  Results for orders placed or performed during the hospital encounter of 08/25/22 (from the past 48 hour(s))  Blood Culture (routine x 2)     Status: None (Preliminary result)   Collection Time: 08/25/22  8:12 PM   Specimen: Vein; Blood  Result Value Ref Range   Specimen Description BLOOD BLOOD RIGHT [place]    Special Requests      BOTTLES DRAWN AEROBIC AND ANAEROBIC Blood Culture adequate volume   Culture      NO GROWTH < 12 HOURS Performed at [facility], [address] [place], [place] [address]    Report Status PENDING   Lactic acid, plasma     Status: Abnormal   Collection Time: 08/25/22  8:18 PM  Result Value Ref Range   Lactic Acid, Venous 2.8 (HH) 0.5 - 1.9 mmol/L    Comment: CRITICAL RESULT CALLED TO, READ BACK BY AND VERIFIED WITH: PESSLAY,E AT 2058 ON 08/25/2022 BY MOSLEY,J Performed at [facility], [address], [place], [place] [address]   Comprehensive metabolic panel     Status: Abnormal   Collection Time: 08/25/22  8:18 PM  Result Value Ref Range   Sodium 135 135 - 145 mmol/L   Potassium 3.4 (L) 3.5 - 5.1 mmol/L   Chloride 100 98 - 111 mmol/L   CO2 25 22 - 32 mmol/L   Glucose, Bld 131 (H) 70 - 99 mg/dL    Comment: Glucose reference range applies only to samples taken after fasting for at least 8  hours.   BUN 14 8 - 23 mg/dL   Creatinine, Ser 0.91 0.44 - 1.00 mg/dL   Calcium 8.9 8.9 - 10.3 mg/dL   Total Protein 7.7 6.5 - 8.1 g/dL   Albumin 3.8 3.5 - 5.0 g/dL   AST 26 15 - 41 U/L   ALT 19 0 - 44 U/L   Alkaline Phosphatase 98 38 - 126 U/L   Total Bilirubin 0.5 0.3 - 1.2 mg/dL   GFR, Estimated >60 >60 mL/min    Comment: (NOTE) Calculated using the CKD-EPI Creatinine Equation (2021)    Anion gap 10 5 - 15    Comment: Performed at [facility], [address], [place], [place] [address]  CBC with Differential     Status: Abnormal   Collection Time: 08/25/22  8:18 PM  Result Value Ref Range   WBC 22.9 (H) 4.0 - 10.5 K/uL   RBC 4.53 3.87 - 5.11 MIL/uL   Hemoglobin  13.5 12.0 - 15.0 g/dL   HCT 40.1 36.0 - 46.0 %   MCV 88.5 80.0 - 100.0 fL   MCH 29.8 26.0 - 34.0 pg   MCHC 33.7 30.0 - 36.0 g/dL   RDW 13.8 11.5 - 15.5 %   Platelets 256 150 - 400 K/uL   nRBC 0.0 0.0 - 0.2 %   Neutrophils Relative % 88 %   Neutro Abs 20.3 (H) 1.7 - 7.7 K/uL   Lymphocytes Relative 5 %   Lymphs Abs 1.2 0.7 - 4.0 K/uL   Monocytes Relative 5 %   Monocytes Absolute 1.0 0.1 - 1.0 K/uL   Eosinophils Relative 1 %   Eosinophils Absolute 0.2 0.0 - 0.5 K/uL   Basophils Relative 0 %   Basophils Absolute 0.1 0.0 - 0.1 K/uL   Immature Granulocytes 1 %   Abs Immature Granulocytes 0.12 (H) 0.00 - 0.07 K/uL    Comment: Performed at [facility], [address], [place], [place] [address]  Protime-INR     Status: None   Collection Time: 08/25/22  8:18 PM  Result Value Ref Range   Prothrombin Time 14.3 11.4 - 15.2 seconds   INR 1.1 0.8 - 1.2    Comment: (NOTE) INR goal varies based on device and disease states. Performed at [facility], [address], [place], [place] [address]   Blood Culture (routine x 2)     Status: None (Preliminary result)   Collection Time: 08/25/22  8:18 PM   Specimen: Vein; Blood  Result Value Ref Range   Specimen Description BLOOD BLOOD LEFT ARM    Special Requests       BOTTLES DRAWN AEROBIC AND ANAEROBIC Blood Culture adequate volume   Culture      NO GROWTH < 12 HOURS Performed at [facility], [address], [place], [place] [address]    Report Status PENDING   Resp panel by RT-PCR (RSV, Flu A&B, Covid) Anterior Nasal Swab     Status: None   Collection Time: 08/25/22  8:33 PM   Specimen: Anterior Nasal Swab  Result Value Ref Range   SARS Coronavirus 2 by RT PCR NEGATIVE NEGATIVE    Comment: (NOTE) SARS-CoV-2 target nucleic acids are NOT DETECTED.  The SARS-CoV-2 RNA is generally detectable in upper respiratory specimens during the acute phase of infection. The lowest concentration of SARS-CoV-2 viral copies this assay can detect is 138 copies/mL. A negative result does not preclude SARS-Cov-2 infection and should not be used as the sole basis for treatment or other patient management decisions. A negative result may occur with  improper specimen collection/handling, submission of specimen other than nasopharyngeal swab, presence of viral mutation(s) within the areas targeted by this assay, and inadequate number of viral copies(<138 copies/mL). A negative result must be combined with clinical observations, patient history, and epidemiological information. The expected result is Negative.  Fact Sheet for Patients:  EntrepreneurPulse.com.au  Fact Sheet for Healthcare Providers:  IncredibleEmployment.be  This test is no t yet approved or cleared by the [place] FDA and  has been authorized for detection and/or diagnosis of SARS-CoV-2 by FDA under an Emergency Use Authorization (EUA). This EUA will remain  in effect (meaning this test can be used) for the duration of the COVID-19 declaration under Section 564(b)(1) of the Act, 21 U.S.C.section 360bbb-3(b)(1), unless the authorization is terminated  or revoked sooner.       Influenza A by PCR NEGATIVE NEGATIVE   Influenza B by PCR NEGATIVE NEGATIVE  Comment: (NOTE) The Xpert Xpress SARS-CoV-2/FLU/RSV plus assay is intended as an aid in the diagnosis of influenza from Nasopharyngeal swab specimens and should not be used as a sole basis for treatment. Nasal washings and aspirates are unacceptable for Xpert Xpress SARS-CoV-2/FLU/RSV testing.  Fact Sheet for Patients: EntrepreneurPulse.com.au  Fact Sheet for Healthcare Providers: IncredibleEmployment.be  This test is not yet approved or cleared by the [place] FDA and has been authorized for detection and/or diagnosis of SARS-CoV-2 by FDA under an Emergency Use Authorization (EUA). This EUA will remain in effect (meaning this test can be used) for the duration of the COVID-19 declaration under Section 564(b)(1) of the Act, 21 U.S.C. section 360bbb-3(b)(1), unless the authorization is terminated or revoked.     Resp Syncytial Virus by PCR NEGATIVE NEGATIVE    Comment: (NOTE) Fact Sheet for Patients: EntrepreneurPulse.com.au  Fact Sheet for Healthcare Providers: IncredibleEmployment.be  This test is not yet approved or cleared by the [place] FDA and has been authorized for detection and/or diagnosis of SARS-CoV-2 by FDA under an Emergency Use Authorization (EUA). This EUA will remain in effect (meaning this test can be used) for the duration of the COVID-19 declaration under Section 564(b)(1) of the Act, 21 U.S.C. section 360bbb-3(b)(1), unless the authorization is terminated or revoked.  Performed at [facility], [address], [place], [place] [address]   Urinalysis, Routine w reflex microscopic     Status: Abnormal   Collection Time: 08/25/22  9:24 PM  Result Value Ref Range   Color, Urine STRAW (A) YELLOW   APPearance CLEAR CLEAR   Specific Gravity, Urine 1.008 1.005 - 1.030   pH 7.0 5.0 - 8.0   Glucose, UA NEGATIVE NEGATIVE mg/dL   Hgb urine dipstick NEGATIVE NEGATIVE   Bilirubin  Urine NEGATIVE NEGATIVE   Ketones, ur NEGATIVE NEGATIVE mg/dL   Protein, ur NEGATIVE NEGATIVE mg/dL   Nitrite NEGATIVE NEGATIVE   Leukocytes,Ua TRACE (A) NEGATIVE   RBC / HPF 0-5 0 - 5 RBC/hpf   WBC, UA 0-5 0 - 5 WBC/hpf   Bacteria, UA RARE (A) NONE SEEN   Squamous Epithelial / LPF 0-5 0 - 5    Comment: Performed at [facility] [address] [place], [place] [address]  HIV Antibody (routine testing w rflx)     Status: None   Collection Time: 08/25/22  9:59 PM  Result Value Ref Range   HIV Screen 4th Generation wRfx Non Reactive Non Reactive    Comment: Performed at [facility], [place] [address], [place], [place] [address]  Lactic acid, plasma     Status: Abnormal   Collection Time: 08/25/22 10:00 PM  Result Value Ref Range   Lactic Acid, Venous 2.5 (HH) 0.5 - 1.9 mmol/L    Comment: CRITICAL RESULT CALLED TO, READ BACK BY AND VERIFIED WITH: B.TESLEY AT 2227 ON 12.21.23 BY ADGER J  Performed at [facility], [address] [place], [place] [address]   Lipase, blood     Status: None   Collection Time: 08/25/22 10:00 PM  Result Value Ref Range   Lipase 32 11 - 51 U/L    Comment: Performed at [facility], [address] [place], [place] [address]  Lactic acid, plasma     Status: Abnormal   Collection Time: 08/26/22  1:55 AM  Result Value Ref Range   Lactic Acid, Venous 2.3 (HH) 0.5 - 1.9 mmol/L    Comment: CRITICAL RESULT CALLED TO, READ BACK BY AND VERIFIED WITH: TEASLEY,  E. ON 08/26/22 '@0318'$  BY LANE,S. Performed at [facility], [address], [place], [place] [address]   Comprehensive metabolic panel     Status: Abnormal   Collection Time: 08/26/22  1:55 AM  Result Value Ref Range   Sodium 138 135 - 145 mmol/L   Potassium 3.5 3.5 - 5.1 mmol/L   Chloride 104 98 - 111 mmol/L   CO2 26 22 - 32 mmol/L   Glucose, Bld 119 (H) 70 - 99 mg/dL    Comment: Glucose reference range applies only to samples taken after fasting for at least 8 hours.   BUN 15 8 - 23 mg/dL    Creatinine, Ser 1.00 0.44 - 1.00 mg/dL   Calcium 8.1 (L) 8.9 - 10.3 mg/dL   Total Protein 6.0 (L) 6.5 - 8.1 g/dL   Albumin 2.9 (L) 3.5 - 5.0 g/dL   AST 24 15 - 41 U/L   ALT 15 0 - 44 U/L   Alkaline Phosphatase 71 38 - 126 U/L   Total Bilirubin 0.4 0.3 - 1.2 mg/dL   GFR, Estimated >60 >60 mL/min    Comment: (NOTE) Calculated using the CKD-EPI Creatinine Equation (2021)    Anion gap 8 5 - 15    Comment: Performed at [facility], [address], [place], [place] [address]  Magnesium     Status: Abnormal   Collection Time: 08/26/22  1:55 AM  Result Value Ref Range   Magnesium 1.4 (L) 1.7 - 2.4 mg/dL    Comment: Performed at [facility], [address], [place], [place] [address]  CBC with Differential/Platelet     Status: Abnormal   Collection Time: 08/26/22  1:55 AM  Result Value Ref Range   WBC 20.8 (H) 4.0 - 10.5 K/uL   RBC 3.89 3.87 - 5.11 MIL/uL   Hemoglobin 11.7 (L) 12.0 - 15.0 g/dL   HCT 35.6 (L) 36.0 - 46.0 %   MCV 91.5 80.0 - 100.0 fL   MCH 30.1 26.0 - 34.0 pg   MCHC 32.9 30.0 - 36.0 g/dL   RDW 14.1 11.5 - 15.5 %   Platelets 188 150 - 400 K/uL   nRBC 0.0 0.0 - 0.2 %   Neutrophils Relative % 89 %   Neutro Abs 18.4 (H) 1.7 - 7.7 K/uL   Lymphocytes Relative 4 %   Lymphs Abs 0.8 0.7 - 4.0 K/uL   Monocytes Relative 6 %   Monocytes Absolute 1.3 (H) 0.1 - 1.0 K/uL   Eosinophils Relative 0 %   Eosinophils Absolute 0.0 0.0 - 0.5 K/uL   Basophils Relative 0 %   Basophils Absolute 0.1 0.0 - 0.1 K/uL   Immature Granulocytes 1 %   Abs Immature Granulocytes 0.13 (H) 0.00 - 0.07 K/uL    Comment: Performed at [facility], [address], [place], [place] [address]  Lactic acid, plasma     Status: None   Collection Time: 08/26/22  8:07 AM  Result Value Ref Range   Lactic Acid, Venous 1.5 0.5 - 1.9 mmol/L    Comment: Performed at [facility], [address] [place],  [address]  Basic metabolic panel     Status: Abnormal   Collection Time: 08/26/22  9:03 AM  Result Value  Ref Range   Sodium 139 135 - 145 mmol/L   Potassium 3.8 3.5 - 5.1 mmol/L   Chloride 109 98 - 111 mmol/L   CO2 25 22 - 32 mmol/L   Glucose, Bld 113 (H) 70 - 99 mg/dL  Comment: Glucose reference range applies only to samples taken after fasting for at least 8 hours.   BUN 16 8 - 23 mg/dL   Creatinine, Ser 1.04 (H) 0.44 - 1.00 mg/dL   Calcium 7.7 (L) 8.9 - 10.3 mg/dL   GFR, Estimated 58 (L) >60 mL/min    Comment: (NOTE) Calculated using the CKD-EPI Creatinine Equation (2021)    Anion gap 5 5 - 15    Comment: Performed at [facility], [address] [place], [place] [address]    NM Hepatobiliary Liver Func  Result Date: 08/26/2022 CLINICAL DATA:  Sepsis, fever, abnormal gallbladder by CT and MR consistent with acute cholecystitis and note of a 4 mm CBD calculus versus sludge EXAM: NUCLEAR MEDICINE HEPATOBILIARY IMAGING TECHNIQUE: Sequential images of the abdomen were obtained out to 60 minutes following intravenous administration of radiopharmaceutical. RADIOPHARMACEUTICALS:  4 mCi Tc-69m Choletec IV COMPARISON:  CT abdomen 08/25/2022, ultrasound abdomen limited 08/26/2022, MR abdomen 08/26/2022 FINDINGS: Study was performed 10 minutes following patient receiving morphine IV for pain. Normal tracer extraction from bloodstream indicating normal hepatocellular function. Large photopenic area at the RIGHT lobe of the liver corresponding to attenuation of the liver by distended gallbladder is noted on coronal CT. Prompt concentration and excretion of tracer by the liver. Duodenum visualized at 35 minutes. Gallbladder did not fill during the hour of imaging. IMPRESSION: Normal hepatocellular function with patent CBD. Nonvisualization of the gallbladder despite morphine consistent with acute cholecystitis, acalculous by ultrasound and MR. Findings called to Dr. JArnoldo Moraleon 08/26/2022 at 1124 hours. Electronically Signed   By: MLavonia DanaM.D.   On: 08/26/2022 11:24   MR ABDOMEN WITH MRCP W  CONTRAST  Result Date: 08/26/2022 CLINICAL DATA:  Right upper quadrant abdominal pain, biliary ductal dilatation and abnormal gallbladder on ultrasound. EXAM: MRI ABDOMEN WITH CONTRAST (WITH MRCP) TECHNIQUE: Multiplanar multisequence MR imaging of the abdomen was performed following the administration of intravenous contrast. Heavily T2-weighted images of the biliary and pancreatic ducts were obtained, and three-dimensional MRCP images were rendered by post processing. CONTRAST:  939mGADAVIST GADOBUTROL 1 MMOL/ML IV SOLN COMPARISON:  08/26/2022 right upper quadrant abdominal sonogram. 08/25/2022 CT chest, abdomen and pelvis. FINDINGS: Lower chest: No acute abnormality at the lung bases. Hepatobiliary: Normal liver size and configuration. Mild diffuse hepatic steatosis. No liver mass. Distended gallbladder (5.2 cm diameter). Mild diffuse gallbladder wall thickening. Patchy pericholecystic fat stranding and minimal ill-defined fluid. Gallbladder nearly filled with sludge. No gallstones. There is focal adenomyomatosis at the fundal gallbladder wall. Mild diffuse central intrahepatic biliary ductal dilatation. Dilated common bile duct with diameter 13 mm. There is a 4 mm T2 intermediate signal intensity filling defect in the lower third of the CBD. No enhancing biliary masses. No beading of the bile ducts. Pancreas: No pancreatic mass or duct dilation.  No pancreas divisum. Spleen: Normal size spleen. Small lobulated cystic 1.9 cm posterior splenic lesion with thin internal septations, compatible with a lymphangioma. No additional splenic lesions. Adrenals/Urinary Tract: Normal adrenals. No left hydronephrosis. Asymmetric mild fullness of the right renal collecting system without overt right hydronephrosis, not appreciably changed from 08/25/2022 CT. A few scattered bilateral small Bosniak category 2 hemorrhagic/proteinaceous renal cysts with internal T1 hyperintensity and no appreciable enhancement, largest 1.2 cm  in the interpolar right kidney (series 20/image 68). Additional scattered simple bilateral subcentimeter renal cysts. No suspicious renal masses. Stomach/Bowel: Tiny hiatal hernia. Otherwise normal nondistended stomach. Visualized small and large bowel is normal caliber, with no bowel wall thickening. Moderate  diffuse colonic diverticulosis. Vascular/Lymphatic: Atherosclerotic nonaneurysmal abdominal aorta. Patent portal, splenic, hepatic and renal veins. No pathologically enlarged lymph nodes in the abdomen. Other: No abdominal ascites or focal fluid collection. Musculoskeletal: No aggressive appearing focal osseous lesions. IMPRESSION: 1. Distended sludge filled gallbladder with mild diffuse wall thickening and pericholecystic edema. No gallstones in the gallbladder. Findings are compatible with acute acalculous cholecystitis in the correct clinical setting. 2. Mild diffuse central intrahepatic biliary ductal dilatation. Dilated common bile duct (13 mm diameter) with solitary 4 mm intermediate T2 signal intensity filling defect in the lower third of the CBD, compatible with either a stone or sludge. No enhancing biliary masses. 3. Mild diffuse hepatic steatosis. 4. Tiny hiatal hernia. 5. Moderate diffuse colonic diverticulosis. 6. Asymmetric mild fullness of the right renal collecting system without overt right hydronephrosis, not appreciably changed from 08/25/2022 CT. 7. Small Bosniak category 1 and category 2 renal cysts. No suspicious renal masses. Electronically Signed   By: Ilona Sorrel M.D.   On: 08/26/2022 09:28   US Abdomen Limited RUQ (LIVER/GB)  Result Date: 08/26/2022 CLINICAL DATA:  [id number] RUQ pain [id number] EXAM: ULTRASOUND ABDOMEN LIMITED RIGHT UPPER QUADRANT COMPARISON:  08/25/2022 CT chest, abdomen and pelvis FINDINGS: Gallbladder: Distended gallbladder with mild diffuse gallbladder wall thickening and with moderate layering gallbladder sludge. No shadowing gallstones demonstrated (scan limited  by patient inability to roll into the left lateral decubitus position). Sonographic Murphy sign present. No convincing pericholecystic fluid. Common bile duct: Diameter: 10 mm Liver: No focal lesion identified. Within normal limits in parenchymal echogenicity. Portal vein is patent on color Doppler imaging with normal direction of blood flow towards the liver. Other: None. IMPRESSION: 1. Distended gallbladder with mild diffuse gallbladder wall thickening and moderate sludge. Sonographic Murphy sign present. No shadowing gallstones. No pericholecystic fluid. Findings are equivocal for acute acalculus cholecystitis. Consider hepatobiliary scintigraphy for further evaluation as clinically warranted. 2. Dilated common bile duct (10 mm diameter). Correlate with serum bilirubin levels. MRI abdomen with MRCP without and with IV contrast may be considered for further evaluation as clinically warranted. 3. Liver within normal limits. Electronically Signed   By: Ilona Sorrel M.D.   On: 08/26/2022 08:05   CT CHEST ABDOMEN PELVIS W CONTRAST  Result Date: 08/25/2022 CLINICAL DATA:  Sepsis.  Fever. EXAM: CT CHEST, ABDOMEN, AND PELVIS WITH CONTRAST TECHNIQUE: Multidetector CT imaging of the chest, abdomen and pelvis was performed following the standard protocol during bolus administration of intravenous contrast. RADIATION DOSE REDUCTION: This exam was performed according to the departmental dose-optimization program which includes automated exposure control, adjustment of the mA and/or kV according to patient size and/or use of iterative reconstruction technique. CONTRAST:  124m OMNIPAQUE IOHEXOL 300 MG/ML  SOLN COMPARISON:  CT abdomen and pelvis 12/09/2019. Report only CT chest abdomen and pelvis 05/03/2017. FINDINGS: CT CHEST FINDINGS Cardiovascular: No significant vascular findings. Normal heart size. No pericardial effusion. There are atherosclerotic calcifications of the aorta. Mediastinum/Nodes: No enlarged  mediastinal, hilar, or axillary lymph nodes. Thyroid gland, trachea, and esophagus demonstrate no significant findings. Lungs/Pleura: Patchy nodular airspace opacity seen in the right upper lobe centrally image 3/50 measuring 6 mm. There is a 5 mm nodule in the left lower lobe image 3/40. There is minimal atelectasis or scarring in the right lung base. The lungs are otherwise clear. There is no pleural effusion or pneumothorax. Musculoskeletal: No chest wall mass or suspicious bone lesions identified. CT ABDOMEN PELVIS FINDINGS Hepatobiliary: The gallbladder is dilated and there is gallbladder wall thickening. There  is intra and extrahepatic biliary ductal dilatation. The common bile duct measures 15 mm. There is mild inflammatory stranding surrounding the gallbladder. No focal liver lesions are seen. Pancreas: Unremarkable. No pancreatic ductal dilatation or surrounding inflammatory changes. Spleen: There is a 12 mm splenic cyst or hemangioma which is unchanged. The spleen is nonenlarged. Adrenals/Urinary Tract: There are subcentimeter cortical hypodensities in both kidneys favored as cysts. There is no hydronephrosis or perinephric fluid. The adrenal glands and bladder are within normal limits. Stomach/Bowel: There is no evidence for bowel obstruction, pneumatosis, free air or inflammatory stranding. Again seen is sigmoid colon diverticulosis. There is questionable sigmoid colon wall thickening versus normal under distension. Stomach, small bowel and appendix are within normal limits. Vascular/Lymphatic: Aortic atherosclerosis. No enlarged abdominal or pelvic lymph nodes. Reproductive: Uterus and bilateral adnexa are unremarkable. Other: No abdominal wall hernia or abnormality. No abdominopelvic ascites. Musculoskeletal: Degenerative changes affect the spine. IMPRESSION: 1. Findings compatible with acute cholecystitis. There is intra and extrahepatic biliary ductal dilatation. 2. Questionable sigmoid colon wall  thickening versus normal under distension. Correlate clinically for colitis. Follow-up colonoscopy should be considered. 3. Sigmoid colon diverticulosis. 4. Patchy nodular airspace opacity in the right upper lobe measuring 6 mm and 5 mm nodule in the left lower lobe. Non-contrast chest CT at 3-6 months is recommended. If the nodules are stable at time of repeat CT, then future CT at 18-24 months (from today's scan) is considered optional for low-risk patients, but is recommended for high-risk patients. This recommendation follows the consensus statement: Guidelines for Management of Incidental Pulmonary Nodules Detected on CT Images: From the Fleischner Society 2017; Radiology 2017; 284:228-243. Aortic Atherosclerosis (ICD10-I70.0). Electronically Signed   By: Ronney Asters M.D.   On: 08/25/2022 22:42   DG Chest Port 1 View  Result Date: 08/25/2022 CLINICAL DATA:  Sepsis EXAM: PORTABLE CHEST 1 VIEW COMPARISON:  04/03/2022 FINDINGS: Mild right basilar scarring/atelectasis. Left lung is clear No pleural effusion or pneumothorax. The heart normal in size. Thoracic aortic atherosclerosis. IMPRESSION: No evidence of acute cardiopulmonary disease. Electronically Signed   By: Julian Hy M.D.   On: 08/25/2022 20:30    ROS:  Review of systems not obtained due to patient factors.  Blood pressure 114/66, pulse (!) 53, temperature 98.1 F (36.7 C), resp. rate (!) 22, height '5\' 7"'$  (1.702 m), weight 81.6 kg, SpO2 94 %. Physical Exam: Pleasant white female no acute distress Head is normocephalic, atraumatic Eyes are without scleral icterus Lungs clear to auscultation with equal breath sounds bilaterally Heart examination reveals regular rate and rhythm without S3, S4, murmurs Abdomen is soft with mild tenderness to deep palpation in the right upper quadrant.  No masses or rigidity are noted.  Assessment/Plan: Impression: A calculus cholecystitis with biliary sludge, choledocholithiasis which is not  obstructing at the present time.  Multiple comorbidities including history of heart failure.  Echo has been done and is pending. Plan: Given her multiple comorbidities, I recommend a cholecystostomy tube placement to relieve her acute cholecystitis.  Cholecystectomy can be done at a future date when she is more stable.  This was discussed with Dr. Wynetta Emery.  Will follow with you.  Aviva Signs 08/26/2022, 11:30 AM

## 2022-08-26 NOTE — TOC Progression Note (Signed)
  Transition of Care [facility]) Screening Note   Patient Details  Name: Kathleen Valenzuela Date of Birth: Dec 03, 1950   Transition of Care [facility]) CM/SW Contact:    Boneta Lucks, RN Phone Number: 08/26/2022, 12:03 PM  Patient from [place] started, ERCP today, possible surgery.   Transition of Care Department [facility]) has reviewed patient and no TOC needs have been identified at this time. We will continue to monitor patient advancement through interdisciplinary progression rounds. If new patient transition needs arise, please place a TOC consult.       Barriers to Discharge: Continued Medical Work up  Expected Discharge Plan and Services       Living arrangements for the past 2 months: [place]

## 2022-08-26 NOTE — ED Notes (Signed)
Dr. Arnoldo Morale in room to assess patient, aware of hypotension.

## 2022-08-26 NOTE — Progress Notes (Signed)
Patient arrival to unit, total assist to bed.  Patient is alert and verbalizes appropriately.

## 2022-08-26 NOTE — Progress Notes (Signed)
PROGRESS NOTE   KIJUANA Valenzuela  PPJ:[id number] DOB: February 06, 1951 DOA: 08/25/2022 PCP: Monico Blitz, MD   Chief Complaint  Patient presents with   Fever   Emesis   Level of care: Telemetry Surgical  Brief Admission History:  71 y.o. female with medical history significant of anemia, anxiety, bipolar 1 disorder, COPD, GERD, heart murmur, hyperlipidemia, hypertension, and more presents ED with a chief complaint of altered mental status.  Unfortunately patient is not able to provide history due to her dementia.  Her son was here earlier and reported to staff that he had spoke to her and she was at her normal baseline.  Later her SNF called and reported that she was altered and coming to the ER.  He reports that she has had some episodes of emesis.  No further history could be obtained.  Son reports this is not patient's baseline.  In the ED they treated her for sepsis, identified the source is acute cholecystitis and used cefepime and Flagyl for antibiotics as patient has a penicillin allergy.  She received fluids.  With these treatments patient did start to improve.  Pt discovered after work up to have acute acalculous cholecystitis.  Dr. Arnoldo Morale evaluated patient and determined next best step is to have cholecystostomy tube placed.  IR requested patient transfer to [facility] to have tube placed on 08/27/22.     Assessment and Plan: * Acute cholecystitis - Leukocytosis 22.9, fever 94.2 - CT chest abdomen pelvis shows acute cholecystitis - No jaundice - Acute metabolic encephalopathy, improved with fluids and antibiotics - Continue cefepime and Flagyl - Surgery to see in the a.m. - Continue pain control with pain scale - N.p.o. ice chips - Holding Eliquis for possible procedure - MRCP and HIDA scan reviewed with Dr Arnoldo Morale, his recommendation for next best step is for cholecystostomy tube placement.  IR requested patient transfer to [facility] for tube placement on 08/27/22.    Sepsis ([place]) - Leukocytosis  22.9, febrile 104.2, tachycardic at 133, tachypneic and respiratory rate 26 - Lactic acid 2.8, and then 2.5 - Acute metabolic encephalopathy - COVID and flu negative, urine culture pending, blood cultures pending - Continue cefepime and Flagyl - Secondary to acute cholecystitis most likely - Continue to monitor  Acute metabolic encephalopathy - Described as delirium but is rapidly improving with supportive measures  - Secondary to sepsis and acute cholecystitis - See respective treatments  Dementia with behavioral disturbance (HCC) - Continue Namenda and Aricept  Atrial fibrillation, chronic (HCC) - Holding Eliquis in the setting of possible procedure tomorrow, continue metoprolol - Patient initially quite tachycardic at 133, at admission remains tachycardic at 115 but blood pressure stable and no further need for intervention with a heart rate of 580  Diastolic dysfunction with heart failure (HCC) - Continue atorvastatin, Imdur, metoprolol - Does not clinically appear to be in exacerbation at this time; follow up preoperative TTE  Hypokalemia - Potassium 3.4, 20 mEq replaced - Trend in the a.m.  GERD (gastroesophageal reflux disease) - Continue Protonix  High cholesterol - Continue statin  Bipolar disorder ([place]) - Continue Latuda, trazodone, Wellbutrin  DVT prophylaxis: SCDs Code Status: Full  Family Communication: son updated bedside 12/22 Disposition: transfer to [facility] for IR cholecystostomy placement Remains inpatient appropriate because: intensity of illness    Consultants:  Surgery IR  Procedures:   Antimicrobials:   Cefepime 12/22>>   Metronidazole 12/22>>  Subjective: Pt reports she has a dry mouth and wants to drink some water.  Objective: Vitals:   08/26/22 1300 08/26/22 1441 08/26/22 1445 08/26/22 1500  BP: (!) 96/52  (!) 112/57 123/68  Pulse: 67  63 70  Resp: '20  16 20  '$ Temp:      TempSrc:      SpO2:  94% 94% 93%  Weight:      Height:         Intake/Output Summary (Last 24 hours) at 08/26/2022 1528 Last data filed at 08/26/2022 1515 Gross per 24 hour  Intake 3196.07 ml  Output --  Net 3196.07 ml   Filed Weights   08/25/22 2004  Weight: 81.6 kg   Examination:  General exam: Appears calm and comfortable  Respiratory system: Clear to auscultation. Respiratory effort normal. Cardiovascular system: normal S1 & S2 heard. No JVD, murmurs, rubs, gallops or clicks. No pedal edema. Gastrointestinal system: Abdomen is nondistended, soft but tender RUQ.   No organomegaly or masses felt. Normal bowel sounds heard. Central nervous system: Alert and oriented. No focal neurological deficits. Extremities: Symmetric 5 x 5 power. Skin: No rashes, lesions or ulcers. Psychiatry: Judgement and insight appear UTD. Mood & affect appropriate.   Data Reviewed: I have personally reviewed following labs and imaging studies  CBC: Recent Labs  Lab 08/25/22 2018 08/26/22 0155  WBC 22.9* 20.8*  NEUTROABS 20.3* 18.4*  HGB 13.5 11.7*  HCT 40.1 35.6*  MCV 88.5 91.5  PLT 256 196    Basic Metabolic Panel: Recent Labs  Lab 08/25/22 2018 08/26/22 0155 08/26/22 0903  NA 135 138 139  K 3.4* 3.5 3.8  CL 100 104 109  CO2 '25 26 25  '$ GLUCOSE 131* 119* 113*  BUN '14 15 16  '$ CREATININE 0.91 1.00 1.04*  CALCIUM 8.9 8.1* 7.7*  MG  --  1.4*  --     CBG: No results for input(s): "GLUCAP" in the last 168 hours.  Recent Results (from the past 240 hour(s))  Blood Culture (routine x 2)     Status: None (Preliminary result)   Collection Time: 08/25/22  8:12 PM   Specimen: Vein; Blood  Result Value Ref Range Status   Specimen Description BLOOD BLOOD RIGHT [place]  Final   Special Requests   Final    BOTTLES DRAWN AEROBIC AND ANAEROBIC Blood Culture adequate volume   Culture   Final    NO GROWTH < 12 HOURS Performed at [facility], [address], [place], [place] [address]    Report Status PENDING  Incomplete  Blood Culture (routine x 2)      Status: None (Preliminary result)   Collection Time: 08/25/22  8:18 PM   Specimen: Vein; Blood  Result Value Ref Range Status   Specimen Description BLOOD BLOOD LEFT ARM  Final   Special Requests   Final    BOTTLES DRAWN AEROBIC AND ANAEROBIC Blood Culture adequate volume   Culture   Final    NO GROWTH < 12 HOURS Performed at [facility], [address], [place], [place] [address]    Report Status PENDING  Incomplete  Resp panel by RT-PCR (RSV, Flu A&B, Covid) Anterior Nasal Swab     Status: None   Collection Time: 08/25/22  8:33 PM   Specimen: Anterior Nasal Swab  Result Value Ref Range Status   SARS Coronavirus 2 by RT PCR NEGATIVE NEGATIVE Final    Comment: (NOTE) SARS-CoV-2 target nucleic acids are NOT DETECTED.  The SARS-CoV-2 RNA is generally detectable in upper respiratory specimens during the acute phase of infection. The lowest  concentration of SARS-CoV-2 viral copies this assay can detect is 138 copies/mL. A negative result does not preclude SARS-Cov-2 infection and should not be used as the sole basis for treatment or other patient management decisions. A negative result may occur with  improper specimen collection/handling, submission of specimen other than nasopharyngeal swab, presence of viral mutation(s) within the areas targeted by this assay, and inadequate number of viral copies(<138 copies/mL). A negative result must be combined with clinical observations, patient history, and epidemiological information. The expected result is Negative.  Fact Sheet for Patients:  EntrepreneurPulse.com.au  Fact Sheet for Healthcare Providers:  IncredibleEmployment.be  This test is no t yet approved or cleared by the [place] FDA and  has been authorized for detection and/or diagnosis of SARS-CoV-2 by FDA under an Emergency Use Authorization (EUA). This EUA will remain  in effect (meaning this test can be used) for the duration of  the COVID-19 declaration under Section 564(b)(1) of the Act, 21 U.S.C.section 360bbb-3(b)(1), unless the authorization is terminated  or revoked sooner.       Influenza A by PCR NEGATIVE NEGATIVE Final   Influenza B by PCR NEGATIVE NEGATIVE Final    Comment: (NOTE) The Xpert Xpress SARS-CoV-2/FLU/RSV plus assay is intended as an aid in the diagnosis of influenza from Nasopharyngeal swab specimens and should not be used as a sole basis for treatment. Nasal washings and aspirates are unacceptable for Xpert Xpress SARS-CoV-2/FLU/RSV testing.  Fact Sheet for Patients: EntrepreneurPulse.com.au  Fact Sheet for Healthcare Providers: IncredibleEmployment.be  This test is not yet approved or cleared by the [place] FDA and has been authorized for detection and/or diagnosis of SARS-CoV-2 by FDA under an Emergency Use Authorization (EUA). This EUA will remain in effect (meaning this test can be used) for the duration of the COVID-19 declaration under Section 564(b)(1) of the Act, 21 U.S.C. section 360bbb-3(b)(1), unless the authorization is terminated or revoked.     Resp Syncytial Virus by PCR NEGATIVE NEGATIVE Final    Comment: (NOTE) Fact Sheet for Patients: EntrepreneurPulse.com.au  Fact Sheet for Healthcare Providers: IncredibleEmployment.be  This test is not yet approved or cleared by the [place] FDA and has been authorized for detection and/or diagnosis of SARS-CoV-2 by FDA under an Emergency Use Authorization (EUA). This EUA will remain in effect (meaning this test can be used) for the duration of the COVID-19 declaration under Section 564(b)(1) of the Act, 21 U.S.C. section 360bbb-3(b)(1), unless the authorization is terminated or revoked.  Performed at [facility], [address], [place], [place] [address]      Radiology Studies: ECHOCARDIOGRAM COMPLETE  Result Date: 08/26/2022     ECHOCARDIOGRAM REPORT   Patient Name:   LAREN ORAMA Shinn Date of Exam: 08/26/2022 Medical Rec #:  [id number]     Height:       67.0 in Accession #:    [id number]    Weight:       180.0 lb Date of Birth:  09-Jan-1951      BSA:          1.934 m Patient Age:    9 years      BP:           102/52 mmHg Patient Gender: F             HR:           57 bpm. Exam Location:  [facility] Procedure: 2D Echo, Cardiac Doppler and Color Doppler Indications:    Pre operative clearance  History:        Patient has prior history of Echocardiogram examinations, most                 recent 04/14/2020. CHF, COPD, Signs/Symptoms:Murmur, Alzheimer's,                 Shortness of Breath and Dyspnea; Risk Factors:Dyslipidemia and                 Hypertension.  Sonographer:    Eartha Inch Referring Phys: Lip[place] Comments: Technically difficult study due to poor echo windows. Image acquisition challenging due to patient body habitus and Image acquisition challenging due to respiratory motion. IMPRESSIONS  1. Left ventricular ejection fraction, by estimation, is 60 to 65%. The left ventricle has normal function. The left ventricle has no regional wall motion abnormalities. Left ventricular diastolic parameters are indeterminate.  2. Right ventricular systolic function is normal. The right ventricular size is normal. Tricuspid regurgitation signal is inadequate for assessing PA pressure.  3. Left atrial size was upper normal.  4. The mitral valve is degenerative. Mild mitral valve regurgitation. Mild mitral stenosis. The mean mitral valve gradient is 4.0 mmHg. Moderate mitral annular calcification.  5. The aortic valve is tricuspid. There is moderate calcification of the aortic valve. Aortic valve regurgitation is mild. Mild to moderate aortic valve stenosis. Aortic valve area, by VTI measures 1.24 cm. Aortic valve mean gradient measures 16.0 mmHg. Dimentionless index 0.49.  6. The inferior vena cava is normal in size  with <50% respiratory variability, suggesting right atrial pressure of 8 mmHg. Comparison(s): Prior images reviewed side by side. Mild progression in aortic stenosis, mild to moderate range. There is also mild mitral stenosis. FINDINGS  Left Ventricle: Left ventricular ejection fraction, by estimation, is 60 to 65%. The left ventricle has normal function. The left ventricle has no regional wall motion abnormalities. The left ventricular internal cavity size was normal in size. There is  borderline left ventricular hypertrophy. Left ventricular diastolic function could not be evaluated due to mitral annular calcification (moderate or greater). Left ventricular diastolic parameters are indeterminate. Right Ventricle: The right ventricular size is normal. No increase in right ventricular wall thickness. Right ventricular systolic function is normal. Tricuspid regurgitation signal is inadequate for assessing PA pressure. Left Atrium: Left atrial size was upper normal. Right Atrium: Right atrial size was normal in size. Pericardium: There is no evidence of pericardial effusion. Mitral Valve: The mitral valve is degenerative in appearance. There is mild thickening of the mitral valve leaflet(s). There is mild calcification of the mitral valve leaflet(s). Moderate mitral annular calcification. Mild mitral valve regurgitation. Mild mitral valve stenosis. MV peak gradient, 8.5 mmHg. The mean mitral valve gradient is 4.0 mmHg. Tricuspid Valve: The tricuspid valve is grossly normal. Tricuspid valve regurgitation is mild . No evidence of tricuspid stenosis. Aortic Valve: The aortic valve is tricuspid. There is moderate calcification of the aortic valve. There is mild to moderate aortic valve annular calcification. Aortic valve regurgitation is mild. Aortic regurgitation PHT measures 508 msec. Mild to moderate aortic stenosis is present. Aortic valve mean gradient measures 16.0 mmHg. Aortic valve peak gradient measures 24.7  mmHg. Aortic valve area, by VTI measures 1.24 cm. Pulmonic Valve: The pulmonic valve was not well visualized. Pulmonic valve regurgitation is not visualized. No evidence of pulmonic stenosis. Aorta: The aortic root is normal in size and structure. Venous: The inferior vena cava is normal in size with less than 50%  respiratory variability, suggesting right atrial pressure of 8 mmHg. IAS/Shunts: No atrial level shunt detected by color flow Doppler.  LEFT VENTRICLE PLAX 2D LVIDd:         3.90 cm     Diastology LVIDs:         2.90 cm     LV e' medial:    4.26 cm/s LV PW:         1.10 cm     LV E/e' medial:  27.5 LV IVS:        1.00 cm     LV e' lateral:   3.09 cm/s LVOT diam:     1.80 cm     LV E/e' lateral: 37.9 LV SV:         74 LV SV Index:   38 LVOT Area:     2.54 cm  LV Volumes (MOD) LV vol d, MOD A2C: 88.0 ml LV vol d, MOD A4C: 98.6 ml LV vol s, MOD A2C: 38.9 ml LV vol s, MOD A4C: 43.8 ml LV SV MOD A2C:     49.1 ml LV SV MOD A4C:     98.6 ml LV SV MOD BP:      55.9 ml RIGHT VENTRICLE            IVC RV S prime:     9.07 cm/s  IVC diam: 1.90 cm TAPSE (M-mode): 2.0 cm LEFT ATRIUM             Index        RIGHT ATRIUM           Index LA diam:        3.90 cm 2.02 cm/m   RA Area:     12.50 cm LA Vol (A2C):   63.3 ml 32.74 ml/m  RA Volume:   26.30 ml  13.60 ml/m LA Vol (A4C):   56.6 ml 29.27 ml/m LA Biplane Vol: 61.9 ml 32.01 ml/m  AORTIC VALVE AV Area (Vmax):    1.19 cm AV Area (Vmean):   1.16 cm AV Area (VTI):     1.24 cm AV Vmax:           248.50 cm/s AV Vmean:          172.000 cm/s AV VTI:            0.600 m AV Peak Grad:      24.7 mmHg AV Mean Grad:      16.0 mmHg LVOT Vmax:         116.00 cm/s LVOT Vmean:        78.400 cm/s LVOT VTI:          0.292 m LVOT/AV VTI ratio: 0.49 AI PHT:            508 msec  AORTA Ao Root diam: 3.40 cm Ao Asc diam:  3.50 cm MITRAL VALVE MV Area (PHT): 1.73 cm     SHUNTS MV Area VTI:   0.81 cm     Systemic VTI:  0.29 m MV Peak grad:  8.5 mmHg     Systemic Diam: 1.80 cm MV  Mean grad:  4.0 mmHg MV Vmax:       1.46 m/s MV Vmean:      93.6 cm/s MV Decel Time: 438 msec MV E velocity: 117.00 cm/s MV A velocity: 138.00 cm/s MV E/A ratio:  0.85 Rozann Lesches MD Electronically signed by Rozann Lesches MD Signature Date/Time: 08/26/2022/2:18:01 PM    Final  NM Hepatobiliary Liver Func  Result Date: 08/26/2022 CLINICAL DATA:  Sepsis, fever, abnormal gallbladder by CT and MR consistent with acute cholecystitis and note of a 4 mm CBD calculus versus sludge EXAM: NUCLEAR MEDICINE HEPATOBILIARY IMAGING TECHNIQUE: Sequential images of the abdomen were obtained out to 60 minutes following intravenous administration of radiopharmaceutical. RADIOPHARMACEUTICALS:  4 mCi Tc-47m Choletec IV COMPARISON:  CT abdomen 08/25/2022, ultrasound abdomen limited 08/26/2022, MR abdomen 08/26/2022 FINDINGS: Study was performed 10 minutes following patient receiving morphine IV for pain. Normal tracer extraction from bloodstream indicating normal hepatocellular function. Large photopenic area at the RIGHT lobe of the liver corresponding to attenuation of the liver by distended gallbladder is noted on coronal CT. Prompt concentration and excretion of tracer by the liver. Duodenum visualized at 35 minutes. Gallbladder did not fill during the hour of imaging. IMPRESSION: Normal hepatocellular function with patent CBD. Nonvisualization of the gallbladder despite morphine consistent with acute cholecystitis, acalculous by ultrasound and MR. Findings called to Dr. JArnoldo Moraleon 08/26/2022 at 1124 hours. Electronically Signed   By: MLavonia DanaM.D.   On: 08/26/2022 11:24   MR ABDOMEN WITH MRCP W CONTRAST  Result Date: 08/26/2022 CLINICAL DATA:  Right upper quadrant abdominal pain, biliary ductal dilatation and abnormal gallbladder on ultrasound. EXAM: MRI ABDOMEN WITH CONTRAST (WITH MRCP) TECHNIQUE: Multiplanar multisequence MR imaging of the abdomen was performed following the administration of intravenous  contrast. Heavily T2-weighted images of the biliary and pancreatic ducts were obtained, and three-dimensional MRCP images were rendered by post processing. CONTRAST:  971mGADAVIST GADOBUTROL 1 MMOL/ML IV SOLN COMPARISON:  08/26/2022 right upper quadrant abdominal sonogram. 08/25/2022 CT chest, abdomen and pelvis. FINDINGS: Lower chest: No acute abnormality at the lung bases. Hepatobiliary: Normal liver size and configuration. Mild diffuse hepatic steatosis. No liver mass. Distended gallbladder (5.2 cm diameter). Mild diffuse gallbladder wall thickening. Patchy pericholecystic fat stranding and minimal ill-defined fluid. Gallbladder nearly filled with sludge. No gallstones. There is focal adenomyomatosis at the fundal gallbladder wall. Mild diffuse central intrahepatic biliary ductal dilatation. Dilated common bile duct with diameter 13 mm. There is a 4 mm T2 intermediate signal intensity filling defect in the lower third of the CBD. No enhancing biliary masses. No beading of the bile ducts. Pancreas: No pancreatic mass or duct dilation.  No pancreas divisum. Spleen: Normal size spleen. Small lobulated cystic 1.9 cm posterior splenic lesion with thin internal septations, compatible with a lymphangioma. No additional splenic lesions. Adrenals/Urinary Tract: Normal adrenals. No left hydronephrosis. Asymmetric mild fullness of the right renal collecting system without overt right hydronephrosis, not appreciably changed from 08/25/2022 CT. A few scattered bilateral small Bosniak category 2 hemorrhagic/proteinaceous renal cysts with internal T1 hyperintensity and no appreciable enhancement, largest 1.2 cm in the interpolar right kidney (series 20/image 68). Additional scattered simple bilateral subcentimeter renal cysts. No suspicious renal masses. Stomach/Bowel: Tiny hiatal hernia. Otherwise normal nondistended stomach. Visualized small and large bowel is normal caliber, with no bowel wall thickening. Moderate diffuse  colonic diverticulosis. Vascular/Lymphatic: Atherosclerotic nonaneurysmal abdominal aorta. Patent portal, splenic, hepatic and renal veins. No pathologically enlarged lymph nodes in the abdomen. Other: No abdominal ascites or focal fluid collection. Musculoskeletal: No aggressive appearing focal osseous lesions. IMPRESSION: 1. Distended sludge filled gallbladder with mild diffuse wall thickening and pericholecystic edema. No gallstones in the gallbladder. Findings are compatible with acute acalculous cholecystitis in the correct clinical setting. 2. Mild diffuse central intrahepatic biliary ductal dilatation. Dilated common bile duct (13 mm diameter) with solitary 4 mm intermediate T2  signal intensity filling defect in the lower third of the CBD, compatible with either a stone or sludge. No enhancing biliary masses. 3. Mild diffuse hepatic steatosis. 4. Tiny hiatal hernia. 5. Moderate diffuse colonic diverticulosis. 6. Asymmetric mild fullness of the right renal collecting system without overt right hydronephrosis, not appreciably changed from 08/25/2022 CT. 7. Small Bosniak category 1 and category 2 renal cysts. No suspicious renal masses. Electronically Signed   By: Ilona Sorrel M.D.   On: 08/26/2022 09:28   US Abdomen Limited RUQ (LIVER/GB)  Result Date: 08/26/2022 CLINICAL DATA:  [id number] RUQ pain 151471 EXAM: ULTRASOUND ABDOMEN LIMITED RIGHT UPPER QUADRANT COMPARISON:  08/25/2022 CT chest, abdomen and pelvis FINDINGS: Gallbladder: Distended gallbladder with mild diffuse gallbladder wall thickening and with moderate layering gallbladder sludge. No shadowing gallstones demonstrated (scan limited by patient inability to roll into the left lateral decubitus position). Sonographic Murphy sign present. No convincing pericholecystic fluid. Common bile duct: Diameter: 10 mm Liver: No focal lesion identified. Within normal limits in parenchymal echogenicity. Portal vein is patent on color Doppler imaging with normal  direction of blood flow towards the liver. Other: None. IMPRESSION: 1. Distended gallbladder with mild diffuse gallbladder wall thickening and moderate sludge. Sonographic Murphy sign present. No shadowing gallstones. No pericholecystic fluid. Findings are equivocal for acute acalculus cholecystitis. Consider hepatobiliary scintigraphy for further evaluation as clinically warranted. 2. Dilated common bile duct (10 mm diameter). Correlate with serum bilirubin levels. MRI abdomen with MRCP without and with IV contrast may be considered for further evaluation as clinically warranted. 3. Liver within normal limits. Electronically Signed   By: Ilona Sorrel M.D.   On: 08/26/2022 08:05   CT CHEST ABDOMEN PELVIS W CONTRAST  Result Date: 08/25/2022 CLINICAL DATA:  Sepsis.  Fever. EXAM: CT CHEST, ABDOMEN, AND PELVIS WITH CONTRAST TECHNIQUE: Multidetector CT imaging of the chest, abdomen and pelvis was performed following the standard protocol during bolus administration of intravenous contrast. RADIATION DOSE REDUCTION: This exam was performed according to the departmental dose-optimization program which includes automated exposure control, adjustment of the mA and/or kV according to patient size and/or use of iterative reconstruction technique. CONTRAST:  172m OMNIPAQUE IOHEXOL 300 MG/ML  SOLN COMPARISON:  CT abdomen and pelvis 12/09/2019. Report only CT chest abdomen and pelvis 05/03/2017. FINDINGS: CT CHEST FINDINGS Cardiovascular: No significant vascular findings. Normal heart size. No pericardial effusion. There are atherosclerotic calcifications of the aorta. Mediastinum/Nodes: No enlarged mediastinal, hilar, or axillary lymph nodes. Thyroid gland, trachea, and esophagus demonstrate no significant findings. Lungs/Pleura: Patchy nodular airspace opacity seen in the right upper lobe centrally image 3/50 measuring 6 mm. There is a 5 mm nodule in the left lower lobe image 3/40. There is minimal atelectasis or scarring  in the right lung base. The lungs are otherwise clear. There is no pleural effusion or pneumothorax. Musculoskeletal: No chest wall mass or suspicious bone lesions identified. CT ABDOMEN PELVIS FINDINGS Hepatobiliary: The gallbladder is dilated and there is gallbladder wall thickening. There is intra and extrahepatic biliary ductal dilatation. The common bile duct measures 15 mm. There is mild inflammatory stranding surrounding the gallbladder. No focal liver lesions are seen. Pancreas: Unremarkable. No pancreatic ductal dilatation or surrounding inflammatory changes. Spleen: There is a 12 mm splenic cyst or hemangioma which is unchanged. The spleen is nonenlarged. Adrenals/Urinary Tract: There are subcentimeter cortical hypodensities in both kidneys favored as cysts. There is no hydronephrosis or perinephric fluid. The adrenal glands and bladder are within normal limits. Stomach/Bowel: There is no evidence for  bowel obstruction, pneumatosis, free air or inflammatory stranding. Again seen is sigmoid colon diverticulosis. There is questionable sigmoid colon wall thickening versus normal under distension. Stomach, small bowel and appendix are within normal limits. Vascular/Lymphatic: Aortic atherosclerosis. No enlarged abdominal or pelvic lymph nodes. Reproductive: Uterus and bilateral adnexa are unremarkable. Other: No abdominal wall hernia or abnormality. No abdominopelvic ascites. Musculoskeletal: Degenerative changes affect the spine. IMPRESSION: 1. Findings compatible with acute cholecystitis. There is intra and extrahepatic biliary ductal dilatation. 2. Questionable sigmoid colon wall thickening versus normal under distension. Correlate clinically for colitis. Follow-up colonoscopy should be considered. 3. Sigmoid colon diverticulosis. 4. Patchy nodular airspace opacity in the right upper lobe measuring 6 mm and 5 mm nodule in the left lower lobe. Non-contrast chest CT at 3-6 months is recommended. If the  nodules are stable at time of repeat CT, then future CT at 18-24 months (from today's scan) is considered optional for low-risk patients, but is recommended for high-risk patients. This recommendation follows the consensus statement: Guidelines for Management of Incidental Pulmonary Nodules Detected on CT Images: From the Fleischner Society 2017; Radiology 2017; 284:228-243. Aortic Atherosclerosis (ICD10-I70.0). Electronically Signed   By: Ronney Asters M.D.   On: 08/25/2022 22:42   DG Chest Port 1 View  Result Date: 08/25/2022 CLINICAL DATA:  Sepsis EXAM: PORTABLE CHEST 1 VIEW COMPARISON:  04/03/2022 FINDINGS: Mild right basilar scarring/atelectasis. Left lung is clear No pleural effusion or pneumothorax. The heart normal in size. Thoracic aortic atherosclerosis. IMPRESSION: No evidence of acute cardiopulmonary disease. Electronically Signed   By: Julian Hy M.D.   On: 08/25/2022 20:30    Scheduled Meds:  albuterol  2 puff Inhalation QID   atorvastatin  40 mg Oral QHS   benztropine  0.5 mg Oral BID   buPROPion  150 mg Oral q morning   donepezil  10 mg Oral Q2000   fluticasone  1 spray Each Nare Daily   fluticasone furoate-vilanterol  1 puff Inhalation Daily   gabapentin  300 mg Oral TID   isosorbide mononitrate  30 mg Oral Daily   lurasidone  80 mg Oral QHS   memantine  10 mg Oral BID   metoprolol succinate  12.5 mg Oral Daily   pantoprazole  40 mg Oral BID AC   tiZANidine  2 mg Oral BID   traZODone  100 mg Oral QHS   venlafaxine XR  300 mg Oral Daily   Continuous Infusions:  sodium chloride 125 mL/hr at 08/26/22 1310   ceFEPime (MAXIPIME) IV     metronidazole Stopped (08/26/22 1515)    LOS: 1 day   Time spent: 36 mins  Kjell Brannen Wynetta Emery, MD How to contact the [facility] Attending or Consulting provider [place] or covering provider during after hours [place], for this patient?  Check the care team in [facility] and look for a) attending/consulting [facility] provider listed and b) the [facility] team  listed Log into [email or web address] and use [place]'s universal password to access. If you do not have the password, please contact the hospital operator. Locate the [facility] provider you are looking for under [facility] and page to a number that you can be directly reached. If you still have difficulty reaching the provider, please page the [facility] (Director on Call) for the Hospitalists listed on amion for assistance.  08/26/2022, 3:28 PM

## 2022-08-26 NOTE — Assessment & Plan Note (Addendum)
-   Described as delirium but is rapidly improving with supportive measures  - Secondary to sepsis and acute cholecystitis - See respective treatments

## 2022-08-26 NOTE — Assessment & Plan Note (Signed)
-   Potassium 3.4, 20 mEq replaced - Trend in the a.m.

## 2022-08-26 NOTE — Assessment & Plan Note (Signed)
-   Continue Latuda, trazodone, Wellbutrin

## 2022-08-26 NOTE — H&P (Signed)
History and Physical    Patient: Kathleen Valenzuela:[id number] DOB: 10-04-1950 DOA: 08/25/2022 DOS: the patient was seen and examined on 08/26/2022 PCP: Monico Blitz, MD  Patient coming from: SNF  Chief Complaint:  Chief Complaint  Patient presents with   Fever   Emesis   HPI: Kathleen Valenzuela is a 71 y.o. female with medical history significant of anemia, anxiety, bipolar 1 disorder, COPD, GERD, heart murmur, hyperlipidemia, hypertension, and more presents ED with a chief complaint of altered mental status.  Unfortunately patient is not able to provide history due to her dementia.  Her son was here earlier and reported to staff that he had spoke to her and she was at her normal baseline.  Later her SNF called and reported that she was altered and coming to the ER.  He reports that she has had some episodes of emesis.  No further history could be obtained.  Son reports this is not patient's baseline.  In the ED they treated her for sepsis, identified the source is acute cholecystitis and used cefepime and Flagyl for antibiotics as patient has a penicillin allergy.  She received fluids.  With these treatments patient did start to improve.  General he was consulted and advised admission to hospitalist and they will see in the a.m. Review of Systems: unable to review all systems due to the inability of the patient to answer questions. Past Medical History:  Diagnosis Date   Anemia    Anxiety    Aortic atherosclerosis ([place]) 04/21/2017   Arthritis    Bipolar 1 disorder (HCC)    Chronic back pain    COPD (chronic obstructive pulmonary disease) (HCC)    Diastolic dysfunction with heart failure ([place]) 04/20/2017   Duodenal stricture    Early onset Alzheimer's dementia (HCC)    GERD (gastroesophageal reflux disease)    Heart murmur    High cholesterol    Hypertension    Shortness of breath dyspnea    Past Surgical History:  Procedure Laterality Date   BIOPSY  01/06/2017   Procedure: BIOPSY;   Surgeon: Rogene Houston, MD;  [facility];  Service: Endoscopy;;  esophageal biopsy   CESAREAN SECTION     COLONOSCOPY     ESOPHAGEAL DILATION N/A 10/29/2015   Procedure: ESOPHAGEAL DILATION;  Surgeon: Rogene Houston, MD;  [facility];  Service: Endoscopy;  Laterality: N/A;   ESOPHAGEAL DILATION N/A 01/29/2016   Procedure: ESOPHAGEAL DILATION;  Surgeon: Rogene Houston, MD;  [facility];  Service: Endoscopy;  Laterality: N/A;   ESOPHAGEAL DILATION N/A 01/06/2017   Procedure: ESOPHAGEAL DILATION;  Surgeon: Rogene Houston, MD;  [facility];  Service: Endoscopy;  Laterality: N/A;   ESOPHAGOGASTRODUODENOSCOPY N/A 10/29/2015   Procedure: ESOPHAGOGASTRODUODENOSCOPY (EGD);  Surgeon: Rogene Houston, MD;  [facility];  Service: Endoscopy;  Laterality: N/A;  1200   ESOPHAGOGASTRODUODENOSCOPY (EGD) WITH PROPOFOL N/A 01/29/2016   Procedure: ESOPHAGOGASTRODUODENOSCOPY (EGD) WITH PROPOFOL;  Surgeon: Rogene Houston, MD;  [facility];  Service: Endoscopy;  Laterality: N/A;  7:30 - moved to 4/21 '@11'$ : 25 - Ann notified pt to arrive at 10:00   ESOPHAGOGASTRODUODENOSCOPY (EGD) WITH PROPOFOL N/A 01/06/2017   Procedure: ESOPHAGOGASTRODUODENOSCOPY (EGD) WITH PROPOFOL;  Surgeon: Rogene Houston, MD;  [facility];  Service: Endoscopy;  Laterality: N/A;  11:20   ESOPHAGOGASTRODUODENOSCOPY (EGD) WITH PROPOFOL N/A 02/17/2017   Procedure: ESOPHAGOGASTRODUODENOSCOPY (EGD) WITH PROPOFOL;  Surgeon: Rogene Houston,  MD;  [facility];  Service: Endoscopy;  Laterality: N/A;  10:30   FOOT SURGERY Right    bunionectomy   HEMORRHOID SURGERY     Social History:  reports that she quit smoking about 7 years ago. Her smoking use included cigarettes. She has a 70.00 pack-year smoking history. She has never used smokeless tobacco. She reports that she does not drink alcohol and does not use drugs.  Allergies  Allergen Reactions    Penicillins Rash    Has patient had a PCN reaction causing immediate rash, facial/tongue/throat swelling, SOB or lightheadedness with hypotension: Yes Has patient had a PCN reaction causing severe rash involving mucus membranes or skin necrosis: No Has patient had a PCN reaction that required hospitalization: No Has patient had a PCN reaction occurring within the last 10 years: No If all of the above answers are "NO", then may proceed with Cephalosporin use. Ceftriaxone ok    History reviewed. No pertinent family history.  Prior to Admission medications   Medication Sig Start Date End Date Taking? Authorizing Provider  albuterol (PROVENTIL HFA;VENTOLIN HFA) 108 (90 BASE) MCG/ACT inhaler Inhale 2 puffs into the lungs 4 (four) times daily. (0800, 1200, 1600, & 2000)    [provider]  ALPRAZolam (XANAX) 1 MG tablet Take 0.5 tablets (0.5 mg total) by mouth 3 (three) times daily as needed for anxiety. (0800, 1400, & 2000) 04/24/17   Rosita Fire, MD  apixaban (ELIQUIS) 5 MG TABS tablet Take 1 tablet (5 mg total) by mouth 2 (two) times daily. 04/15/20   Orson Eva, MD  atorvastatin (LIPITOR) 10 MG tablet Take 1 tablet (10 mg total) by mouth daily at 8 pm. Resume on 04/22/20 if LFTs are back to normal 04/15/20   Tat, David, MD  benztropine (COGENTIN) 1 MG tablet Take 1 mg by mouth 2 (two) times daily. 04/10/20   [provider]  buPROPion (WELLBUTRIN XL) 150 MG 24 hr tablet Take 150 mg by mouth every morning. 03/11/20   [provider]  calcium carbonate (TUMS - DOSED IN MG ELEMENTAL CALCIUM) 500 MG chewable tablet Chew 2 tablets by mouth every 2 (two) hours as needed for indigestion (CHEW BEFORE SWALLOWING).    [provider]  cefdinir (OMNICEF) 300 MG capsule Take 1 capsule (300 mg total) by mouth every 12 (twelve) hours. 04/15/20   Orson Eva, MD  donepezil (ARICEPT) 10 MG tablet Take 10 mg by mouth daily at 8 pm.    [provider]  Ferrous Gluconate  324 (37.5 Fe) MG TABS Take 324 mg by mouth 2 (two) times daily. (0800 & 2000)    [provider]  fluticasone (FLONASE) 50 MCG/ACT nasal spray Place 1 spray into both nostrils daily. (0800)    [provider]  Fluticasone Furoate-Vilanterol (BREO ELLIPTA) 200-25 MCG/INH AEPB Inhale 1 puff into the lungs daily. (0800)    [provider]  furosemide (LASIX) 40 MG tablet Take 40 mg by mouth daily. (0800)    [provider]  gabapentin (NEURONTIN) 300 MG capsule Take 300 mg by mouth 3 (three) times daily. 11/14/19   [provider]  isosorbide mononitrate (IMDUR) 30 MG 24 hr tablet Take 30 mg by mouth daily. (0800)    [provider]  LATUDA 80 MG TABS tablet Take 80 mg by mouth at bedtime. 11/26/19   [provider]  memantine (NAMENDA) 10 MG tablet Take 10 mg by mouth 2 (two) times daily. 04/10/20   [provider]  Menthol-Zinc Oxide (RISAMINE) 0.44-20.625 % OINT Apply 1 application topically 4 (four) times daily as needed (for rash/redness).    [provider]  metoprolol succinate (TOPROL-XL) 50 MG 24 hr tablet Take 50 mg by mouth daily. (0800)Take with or immediately following a meal.    [provider]  Nystatin (GERHARDT'S BUTT CREAM) CREA Apply 1 application topically daily. 04/15/20   Orson Eva, MD  Olopatadine HCl (PATADAY) 0.2 % SOLN Place 1 drop into both eyes daily. (0800)    [provider]  ondansetron (ZOFRAN) 4 MG tablet Take 4 mg by mouth every 8 (eight) hours as needed for nausea/vomiting. 04/10/20   [provider]  Oxycodone HCl 10 MG TABS Take 1 tablet (10 mg total) by mouth every 8 (eight) hours as needed (FOR Pain). 04/15/20   Orson Eva, MD  pantoprazole (PROTONIX) 40 MG tablet Take 1 tablet (40 mg total) by mouth 2 (two) times daily before a meal. 02/17/17   Rehman, Mechele Dawley, MD  potassium chloride (KLOR-CON) 10 MEQ tablet Take 1 tablet by mouth daily. 04/10/20   [provider]  tiZANidine (ZANAFLEX) 2 MG tablet Take 2 mg by mouth 2 (two) times daily. (0800 & 2000) 12/01/16   [provider]  traZODone (DESYREL) 100 MG tablet Take 100 mg by mouth at bedtime. 04/10/20   [provider]  venlafaxine XR (EFFEXOR-XR) 150 MG 24 hr capsule Take 300 mg by mouth daily. 04/10/20   [provider]    Physical Exam: Vitals:   08/26/22 0200 08/26/22 0230 08/26/22 0257 08/26/22 0259  BP: (!) 90/53 (!) 97/53 (!) 96/56   Pulse:   77   Resp: (!) '21 18 20   '$ Temp:    98.3 F (36.8 C)  TempSrc:    Oral  SpO2:   93%   Weight:      Height:       1.  General: Patient lying supine in bed,  no acute distress   2. Psychiatric: Alert and oriented x person, place, but not time or context, mood and behavior normal for situation, pleasant and cooperative with exam   3. Neurologic: Speech and language are normal, face is symmetric, moves all 4 extremities voluntarily, at baseline without acute deficits on limited exam   4. HEENMT:  Head is atraumatic, normocephalic, pupils reactive to light, neck is supple, trachea is midline, mucous membranes are moist   5. Respiratory : Lungs are clear to auscultation bilaterally without wheezing, rhonchi, rales, no cyanosis, no increase in work of breathing or accessory muscle use   6. Cardiovascular : Heart rate normal, rhythm is regular, no murmurs, rubs or gallops, no peripheral edema, peripheral pulses palpated   7. Gastrointestinal:  Abdomen is soft, nondistended, nontender to palpation bowel sounds active, no masses or organomegaly palpated   8. Skin:  Skin is warm, dry and intact without rashes, acute lesions, or ulcers on limited exam   9.Musculoskeletal:  No acute deformities or trauma, no asymmetry in tone, no peripheral edema, peripheral pulses palpated, no tenderness to palpation in the extremities  Data Reviewed: In the ED Temp 98.3-104.2, heart rate 115-1 33, respiratory rate 19-26,  blood pressure 116/71-163/102 Lactic acid initially 2.8 and then 2.5 Leukocytosis at 22.9, hemoglobin 13.9, platelets 256 Potassium 3.4, replaced with 20 mEq of potassium Negative COVID and flu Urine culture pending, blood cultures pending CT chest abdomen pelvis shows cholecystitis with intra and extrahepatic biliary ductal dilatation General surgery was consulted  and recommends right upper quadrant ultrasound in the a.m. n.p.o. Admission requested for acute cholecystitis  Assessment and Plan: * Acute cholecystitis - Leukocytosis 22.9, fever 94.2 - CT chest abdomen pelvis shows acute cholecystitis - No jaundice - Acute metabolic encephalopathy, improved with fluids and antibiotics - Continue cefepime and Flagyl - Surgery to see in the a.m. - Continue pain control with pain scale - N.p.o. - Holding Eliquis for possible procedure - Right upper quadrant ultrasound in the a.m. - Continue to monitor  Sepsis (HCC) - Leukocytosis 22.9, febrile 104.2, tachycardic at 133, tachypneic and respiratory rate 26 - Lactic acid 2.8, and then 2.5 - Acute metabolic encephalopathy - COVID and flu negative, urine culture pending, blood cultures pending - Continue cefepime and Flagyl - Secondary to acute cholecystitis most likely - Continue to monitor  Acute metabolic encephalopathy - Described as delirium - Secondary to sepsis and acute cholecystitis - See respective treatments  Dementia with behavioral disturbance (HCC) - Continue Namenda and Aricept  Atrial fibrillation, chronic (HCC) - Holding Eliquis in the setting of possible procedure tomorrow, continue metoprolol - Patient initially quite tachycardic at 133, at admission remains tachycardic at 115 but blood pressure stable and no further need for intervention with a heart rate of 384  Diastolic dysfunction with heart failure (HCC) - Continue atorvastatin, Imdur, metoprolol - Does not clinically appear to be in exacerbation at this  time  Hypokalemia - Potassium 3.4, 20 mEq replaced - Trend in the a.m.  GERD (gastroesophageal reflux disease) - Continue Protonix  High cholesterol - Continue statin  Bipolar disorder ([place]) - Continue Latuda, trazodone, Wellbutrin      Advance Care Planning:   Code Status: Full Code  Consults: General surgery  Family Communication: No family at bedside  Severity of Illness: The appropriate patient status for this patient is INPATIENT. Inpatient status is judged to be reasonable and necessary in order to provide the required intensity of service to ensure the patient's safety. The patient's presenting symptoms, physical exam findings, and initial radiographic and laboratory data in the context of their chronic comorbidities is felt to place them at high risk for further clinical deterioration. Furthermore, it is not anticipated that the patient will be medically stable for discharge from the hospital within 2 midnights of admission.   * I certify that at the point of admission it is my clinical judgment that the patient will require inpatient hospital care spanning beyond 2 midnights from the point of admission due to high intensity of service, high risk for further deterioration and high frequency of surveillance required.*  Author: Rolla Plate, DO 08/26/2022 4:28 AM  For on call review [email or web address].

## 2022-08-26 NOTE — ED Notes (Signed)
MD at bedside and states to proceed with Morphine admin for pain. Echo at bedside. States that we can increase IVF rate if needed to maintain pressures.

## 2022-08-26 NOTE — Assessment & Plan Note (Signed)
Continue statin. 

## 2022-08-27 ENCOUNTER — Inpatient Hospital Stay (HOSPITAL_COMMUNITY): Payer: Medicare Other

## 2022-08-27 DIAGNOSIS — K810 Acute cholecystitis: Secondary | ICD-10-CM | POA: Diagnosis not present

## 2022-08-27 HISTORY — PX: IR PERC CHOLECYSTOSTOMY: IMG2326

## 2022-08-27 LAB — CBC WITH DIFFERENTIAL/PLATELET
Abs Immature Granulocytes: 0.19 10*3/uL — ABNORMAL HIGH (ref 0.00–0.07)
Basophils Absolute: 0 10*3/uL (ref 0.0–0.1)
Basophils Relative: 0 %
Eosinophils Absolute: 0 10*3/uL (ref 0.0–0.5)
Eosinophils Relative: 0 %
HCT: 35.3 % — ABNORMAL LOW (ref 36.0–46.0)
Hemoglobin: 11.7 g/dL — ABNORMAL LOW (ref 12.0–15.0)
Immature Granulocytes: 1 %
Lymphocytes Relative: 4 %
Lymphs Abs: 0.8 10*3/uL (ref 0.7–4.0)
MCH: 29.7 pg (ref 26.0–34.0)
MCHC: 33.1 g/dL (ref 30.0–36.0)
MCV: 89.6 fL (ref 80.0–100.0)
Monocytes Absolute: 1.1 10*3/uL — ABNORMAL HIGH (ref 0.1–1.0)
Monocytes Relative: 6 %
Neutro Abs: 16.3 10*3/uL — ABNORMAL HIGH (ref 1.7–7.7)
Neutrophils Relative %: 89 %
Platelets: 174 10*3/uL (ref 150–400)
RBC: 3.94 MIL/uL (ref 3.87–5.11)
RDW: 14.4 % (ref 11.5–15.5)
WBC: 18.4 10*3/uL — ABNORMAL HIGH (ref 4.0–10.5)
nRBC: 0 % (ref 0.0–0.2)

## 2022-08-27 LAB — COMPREHENSIVE METABOLIC PANEL
ALT: 15 U/L (ref 0–44)
AST: 23 U/L (ref 15–41)
Albumin: 2.5 g/dL — ABNORMAL LOW (ref 3.5–5.0)
Alkaline Phosphatase: 61 U/L (ref 38–126)
Anion gap: 6 (ref 5–15)
BUN: 12 mg/dL (ref 8–23)
CO2: 22 mmol/L (ref 22–32)
Calcium: 8 mg/dL — ABNORMAL LOW (ref 8.9–10.3)
Chloride: 112 mmol/L — ABNORMAL HIGH (ref 98–111)
Creatinine, Ser: 0.99 mg/dL (ref 0.44–1.00)
GFR, Estimated: >60 mL/min (ref >60)
Glucose, Bld: 118 mg/dL — ABNORMAL HIGH (ref 70–99)
Potassium: 3.5 mmol/L (ref 3.5–5.1)
Sodium: 140 mmol/L (ref 135–145)
Total Bilirubin: 0.7 mg/dL (ref 0.3–1.2)
Total Protein: 6 g/dL — ABNORMAL LOW (ref 6.5–8.1)

## 2022-08-27 LAB — URINE CULTURE: Culture: <10000 — AB

## 2022-08-27 LAB — MAGNESIUM: Magnesium: 2.3 mg/dL (ref 1.7–2.4)

## 2022-08-27 MED ORDER — MIDAZOLAM HCL 2 MG/2ML IJ SOLN
INTRAMUSCULAR | Status: AC | PRN
  Administered 2022-08-27: .5 mg via INTRAVENOUS
  Administered 2022-08-27: 1 mg via INTRAVENOUS

## 2022-08-27 MED ORDER — ALBUTEROL SULFATE (2.5 MG/3ML) 0.083% IN NEBU
3.0000 mL | INHALATION_SOLUTION | RESPIRATORY_TRACT | Status: DC | PRN
  Administered 2022-08-30: 3 mL via RESPIRATORY_TRACT
  Filled 2022-08-27: qty 3

## 2022-08-27 MED ORDER — IOHEXOL 300 MG/ML  SOLN: 50.0000 mL | Freq: Once | INTRAMUSCULAR | Status: DC | PRN

## 2022-08-27 MED ORDER — FENTANYL CITRATE (PF) 100 MCG/2ML IJ SOLN
INTRAMUSCULAR | Status: AC | PRN
  Administered 2022-08-27: 25 ug via INTRAVENOUS
  Administered 2022-08-27: 50 ug via INTRAVENOUS

## 2022-08-27 MED ORDER — LIDOCAINE-EPINEPHRINE 1 %-1:100000 IJ SOLN
INTRAMUSCULAR | Status: AC
  Administered 2022-08-27: 5 mL
  Filled 2022-08-27: qty 1

## 2022-08-27 MED ORDER — MIDAZOLAM HCL 2 MG/2ML IJ SOLN
INTRAMUSCULAR | Status: AC
  Filled 2022-08-27: qty 4

## 2022-08-27 MED ORDER — SODIUM CHLORIDE 0.9% FLUSH
5.0000 mL | Freq: Three times a day (TID) | INTRAVENOUS | Status: DC
  Administered 2022-08-27 – 2022-09-16 (×49): 5 mL

## 2022-08-27 MED ORDER — FENTANYL CITRATE (PF) 100 MCG/2ML IJ SOLN
INTRAMUSCULAR | Status: AC
  Filled 2022-08-27: qty 4

## 2022-08-27 NOTE — Procedures (Signed)
Interventional Radiology Procedure Note  Date of Procedure: 08/27/2022  Procedure: Cholecystostomy tube placement   Findings:  1. Cholecystostomy tube placement 10 Fr, to bulb drainage    Complications: No immediate complications noted.   Estimated Blood Loss: minimal  Follow-up and Recommendations: 1. Bedrest 1 hour    Albin Felling, MD  Vascular & Interventional Radiology  08/27/2022 2:45 PM

## 2022-08-27 NOTE — Consult Note (Signed)
Chief Complaint: Patient was seen in consultation today for acute cholecystitis  Referring Physician(s): Irwin Brakeman, MD   Supervising Physician: Juliet Rude  Patient Status: [facility] - In-pt  History of Present Illness: Kathleen Valenzuela is a 71 y.o. female with PMH significant for anemia, anxiety, bipolar disorder, COPD, heart murmur, and hypertension being seen today in relation to acute cholecystitis. Per the patient's son, the patient began to have a fever at her care facility on 12/21, and the son brought the patient to [facility] ED for evaluation. The patient was subsequently found to have acute cholecystitis on CT. Follow-up MRCP found a sludge-filled gallbladder without evidence of a stone, though the common bile duct did have a 26m filling defect compatible with a stone or sludge. [facility]   Past Medical History:  Diagnosis Date   Anemia    Anxiety    Aortic atherosclerosis ([facility] 04/21/2017   Arthritis    Bipolar 1 disorder (HCC)    Chronic back pain    COPD (chronic obstructive pulmonary disease) (HCC)    Diastolic dysfunction with heart failure ([facility] 04/20/2017   Duodenal stricture    Early onset Alzheimer's dementia (HCC)    GERD (gastroesophageal reflux disease)    Heart murmur    High cholesterol    Hypertension    Shortness of breath dyspnea     Past Surgical History:  Procedure Laterality Date   BIOPSY  01/06/2017   Procedure: BIOPSY;  Surgeon: RRogene Houston MD;  [facility];  Service: Endoscopy;;  esophageal biopsy   CESAREAN SECTION     COLONOSCOPY     ESOPHAGEAL DILATION N/A 10/29/2015   Procedure: ESOPHAGEAL DILATION;  Surgeon: NRogene Houston MD;  [facility];  Service: Endoscopy;  Laterality: N/A;   ESOPHAGEAL DILATION N/A 01/29/2016   Procedure: ESOPHAGEAL DILATION;  Surgeon: NRogene Houston MD;  [facility];  Service:  Endoscopy;  Laterality: N/A;   ESOPHAGEAL DILATION N/A 01/06/2017   Procedure: ESOPHAGEAL DILATION;  Surgeon: RRogene Houston MD;  [facility];  Service: Endoscopy;  Laterality: N/A;   ESOPHAGOGASTRODUODENOSCOPY N/A 10/29/2015   Procedure: ESOPHAGOGASTRODUODENOSCOPY (EGD);  Surgeon: NRogene Houston MD;  [facility];  Service: Endoscopy;  Laterality: N/A;  1200   ESOPHAGOGASTRODUODENOSCOPY (EGD) WITH PROPOFOL N/A 01/29/2016   Procedure: ESOPHAGOGASTRODUODENOSCOPY (EGD) WITH PROPOFOL;  Surgeon: NRogene Houston MD;  [facility];  Service: Endoscopy;  Laterality: N/A;  7:30 - moved to 4/21 '@11'$ : 25 - Ann notified pt to arrive at 10:00   ESOPHAGOGASTRODUODENOSCOPY (EGD) WITH PROPOFOL N/A 01/06/2017   Procedure: ESOPHAGOGASTRODUODENOSCOPY (EGD) WITH PROPOFOL;  Surgeon: RRogene Houston MD;  [facility];  Service: Endoscopy;  Laterality: N/A;  11:20   ESOPHAGOGASTRODUODENOSCOPY (EGD) WITH PROPOFOL N/A 02/17/2017   Procedure: ESOPHAGOGASTRODUODENOSCOPY (EGD) WITH PROPOFOL;  Surgeon: RRogene Houston MD;  [facility];  Service: Endoscopy;  Laterality: N/A;  10:30   FOOT SURGERY Right    bunionectomy   HEMORRHOID SURGERY      Allergies: Penicillins  Medications: Prior to Admission medications   Medication Sig Start Date End Date Taking? Authorizing Provider  acetaminophen (TYLENOL) 650 MG CR tablet Take 650 mg by mouth every 8 (eight) hours as needed for pain.   Yes [provider]  albuterol (PROVENTIL HFA;VENTOLIN HFA) 108 (90 BASE) MCG/ACT inhaler Inhale 2 puffs into the lungs 4 (four)  times daily. (0800, 1200, 1600, & 2000)   Yes [provider]  ALPRAZolam (XANAX) 1 MG tablet Take 0.5 tablets (0.5 mg total) by mouth 3 (three) times daily as needed for anxiety. (0800, 1400, & 2000) Patient taking differently: Take 0.5 mg by mouth 2 (two) times daily. (0800 & 2000) 04/24/17  Yes Rosita Fire, MD  apixaban (ELIQUIS)  5 MG TABS tablet Take 1 tablet (5 mg total) by mouth 2 (two) times daily. 04/15/20  Yes Tat, Shanon Brow, MD  atorvastatin (LIPITOR) 10 MG tablet Take 1 tablet (10 mg total) by mouth daily at 8 pm. Resume on 04/22/20 if LFTs are back to normal Patient taking differently: Take 40 mg by mouth daily at 8 pm. 04/15/20  Yes Tat, Shanon Brow, MD  benztropine (COGENTIN) 1 MG tablet Take 0.5 mg by mouth 2 (two) times daily. 04/10/20  Yes [provider]  buPROPion (WELLBUTRIN XL) 150 MG 24 hr tablet Take 150 mg by mouth every morning. 03/11/20  Yes [provider]  donepezil (ARICEPT) 10 MG tablet Take 10 mg by mouth daily at 8 pm.   Yes [provider]  doxycycline (VIBRAMYCIN) 100 MG capsule Take 100 mg by mouth daily. 08/09/22  Yes [provider]  Ferrous Gluconate 324 (37.5 Fe) MG TABS Take 324 mg by mouth 2 (two) times daily. (0800 & 2000)   Yes [provider]  fluticasone (FLONASE) 50 MCG/ACT nasal spray Place 1 spray into both nostrils daily. (0800)   Yes [provider]  Fluticasone Furoate-Vilanterol (BREO ELLIPTA) 200-25 MCG/INH AEPB Inhale 1 puff into the lungs daily. (0800)   Yes [provider]  furosemide (LASIX) 40 MG tablet Take 40 mg by mouth daily. (0800)   Yes [provider]  gabapentin (NEURONTIN) 300 MG capsule Take 300 mg by mouth 3 (three) times daily. 11/14/19  Yes [provider]  hydrocortisone 1 % ointment Apply 1 Application topically 3 (three) times daily. 06/21/22  Yes [provider]  isosorbide mononitrate (IMDUR) 30 MG 24 hr tablet Take 30 mg by mouth daily. (0800)   Yes [provider]  LATUDA 80 MG TABS tablet Take 40 mg by mouth at bedtime. 11/26/19  Yes [provider]  memantine (NAMENDA) 10 MG tablet Take 10 mg by mouth 2 (two) times daily. 04/10/20  Yes [provider]  Menthol-Zinc Oxide (RISAMINE) 0.44-20.625 % OINT Apply 1 application topically 4 (four) times daily as  needed (for rash/redness).   Yes [provider]  metoprolol succinate (TOPROL-XL) 50 MG 24 hr tablet Take 50 mg by mouth daily. (0800)Take with or immediately following a meal.   Yes [provider]  Olopatadine HCl (PATADAY) 0.2 % SOLN Place 1 drop into both eyes daily. (0800)   Yes [provider]  pantoprazole (PROTONIX) 40 MG tablet Take 1 tablet (40 mg total) by mouth 2 (two) times daily before a meal. 02/17/17  Yes Rehman, Mechele Dawley, MD  potassium chloride (KLOR-CON) 10 MEQ tablet Take 1 tablet by mouth daily. 04/10/20  Yes [provider]  tiZANidine (ZANAFLEX) 2 MG tablet Take 2 mg by mouth 2 (two) times daily. (0800 & 2000) 12/01/16  Yes [provider]  traZODone (DESYREL) 100 MG tablet Take 100 mg by mouth at bedtime. 04/10/20  Yes [provider]  venlafaxine XR (EFFEXOR-XR) 150 MG 24 hr capsule Take 300 mg by mouth daily. 04/10/20  Yes [provider]     History reviewed. No pertinent family history.  Social History   Socioeconomic History   Marital status: Divorced    Spouse name: Not on file   Number of children: Not on file   Years of education: Not on file   Highest education level: Not on file  Occupational History   Not on file  Tobacco Use   Smoking status: Former    Packs/day: 2.00    Years: 35.00    Total pack years: 70.00    Types: Cigarettes    Quit date: 07/25/2015    Years since quitting: 7.0   Smokeless tobacco: Never   Tobacco comments:    quit 2 weeks ago (November 2016)  Vaping Use   Vaping Use: Never used  Substance and Sexual Activity   Alcohol use: No    Alcohol/week: 0.0 standard drinks of alcohol   Drug use: No   Sexual activity: Not Currently    Birth control/protection: None  Other Topics Concern   Not on file  Social History Narrative   Not on file   Social Determinants of Health   Financial Resource Strain: Not on file  Food Insecurity: Not on file  Transportation Needs:  Not on file  Physical Activity: Not on file  Stress: Not on file  Social Connections: Not on file    Review of Systems: A 12 point ROS discussed and pertinent positives are indicated in the HPI above.  All other systems are negative.  Review of Systems  Reason unable to perform ROS: Patient with pleasant dementia, not giving appropriate responses to questions.    Vital Signs: BP (!) 107/53 (BP Location: Left Arm)   Pulse 92   Temp 98 F (36.7 C)   Resp 17   Ht '5\' 7"'$  (1.702 m)   Wt 173 lb 15.1 oz (78.9 kg)   SpO2 91%   BMI 27.24 kg/m    Physical Exam Vitals reviewed.  Constitutional:      General: She is not in acute distress. Cardiovascular:     Rate and Rhythm: Normal rate and regular rhythm.     Pulses: Normal pulses.     Heart sounds: Murmur heard.  Pulmonary:     Effort: Pulmonary effort is normal.     Breath sounds: Normal breath sounds.  Abdominal:     General: Bowel sounds are normal.     Palpations: Abdomen is soft.     Tenderness: There is abdominal tenderness. There is no rebound.  Musculoskeletal:     Right lower leg: No edema.     Left lower leg: No edema.  Skin:    General: Skin is warm and dry.  Neurological:     Mental Status: She is alert. She is disoriented.     Imaging: ECHOCARDIOGRAM COMPLETE  Result Date: 08/26/2022    ECHOCARDIOGRAM REPORT   Patient Name:   SCOTLYN MCCRANIE Buckhalter Date of Exam: 08/26/2022 Medical Rec #:  [id number]     Height:       67.0 in Accession #:    [id number]    Weight:       180.0 lb Date of Birth:  11-02-1950      BSA:          1.934 m Patient Age:    67 years      BP:           102/52 mmHg Patient Gender: F             HR:  57 bpm. Exam Location:  [facility] Procedure: 2D Echo, Cardiac Doppler and Color Doppler Indications:    Pre operative clearance  History:        Patient has prior history of Echocardiogram examinations, most                 recent 04/14/2020. CHF, COPD, Signs/Symptoms:Murmur, Alzheimer's,                  Shortness of Breath and Dyspnea; Risk Factors:Dyslipidemia and                 Hypertension.  Sonographer:    Eartha Inch Referring Phys: [place] Comments: Technically difficult study due to poor echo windows. Image acquisition challenging due to patient body habitus and Image acquisition challenging due to respiratory motion. IMPRESSIONS  1. Left ventricular ejection fraction, by estimation, is 60 to 65%. The left ventricle has normal function. The left ventricle has no regional wall motion abnormalities. Left ventricular diastolic parameters are indeterminate.  2. Right ventricular systolic function is normal. The right ventricular size is normal. Tricuspid regurgitation signal is inadequate for assessing PA pressure.  3. Left atrial size was upper normal.  4. The mitral valve is degenerative. Mild mitral valve regurgitation. Mild mitral stenosis. The mean mitral valve gradient is 4.0 mmHg. Moderate mitral annular calcification.  5. The aortic valve is tricuspid. There is moderate calcification of the aortic valve. Aortic valve regurgitation is mild. Mild to moderate aortic valve stenosis. Aortic valve area, by VTI measures 1.24 cm. Aortic valve mean gradient measures 16.0 mmHg. Dimentionless index 0.49.  6. The inferior vena cava is normal in size with <50% respiratory variability, suggesting right atrial pressure of 8 mmHg. Comparison(s): Prior images reviewed side by side. Mild progression in aortic stenosis, mild to moderate range. There is also mild mitral stenosis. FINDINGS  Left Ventricle: Left ventricular ejection fraction, by estimation, is 60 to 65%. The left ventricle has normal function. The left ventricle has no regional wall motion abnormalities. The left ventricular internal cavity size was normal in size. There is  borderline left ventricular hypertrophy. Left ventricular diastolic function could not be evaluated due to mitral annular calcification (moderate  or greater). Left ventricular diastolic parameters are indeterminate. Right Ventricle: The right ventricular size is normal. No increase in right ventricular wall thickness. Right ventricular systolic function is normal. Tricuspid regurgitation signal is inadequate for assessing PA pressure. Left Atrium: Left atrial size was upper normal. Right Atrium: Right atrial size was normal in size. Pericardium: There is no evidence of pericardial effusion. Mitral Valve: The mitral valve is degenerative in appearance. There is mild thickening of the mitral valve leaflet(s). There is mild calcification of the mitral valve leaflet(s). Moderate mitral annular calcification. Mild mitral valve regurgitation. Mild mitral valve stenosis. MV peak gradient, 8.5 mmHg. The mean mitral valve gradient is 4.0 mmHg. Tricuspid Valve: The tricuspid valve is grossly normal. Tricuspid valve regurgitation is mild . No evidence of tricuspid stenosis. Aortic Valve: The aortic valve is tricuspid. There is moderate calcification of the aortic valve. There is mild to moderate aortic valve annular calcification. Aortic valve regurgitation is mild. Aortic regurgitation PHT measures 508 msec. Mild to moderate aortic stenosis is present. Aortic valve mean gradient measures 16.0 mmHg. Aortic valve peak gradient measures 24.7 mmHg. Aortic valve area, by VTI measures 1.24 cm. Pulmonic Valve: The pulmonic valve was not well visualized. Pulmonic valve regurgitation is not visualized. No evidence of pulmonic stenosis.  Aorta: The aortic root is normal in size and structure. Venous: The inferior vena cava is normal in size with less than 50% respiratory variability, suggesting right atrial pressure of 8 mmHg. IAS/Shunts: No atrial level shunt detected by color flow Doppler.  LEFT VENTRICLE PLAX 2D LVIDd:         3.90 cm     Diastology LVIDs:         2.90 cm     LV e' medial:    4.26 cm/s LV PW:         1.10 cm     LV E/e' medial:  27.5 LV IVS:        1.00 cm      LV e' lateral:   3.09 cm/s LVOT diam:     1.80 cm     LV E/e' lateral: 37.9 LV SV:         74 LV SV Index:   38 LVOT Area:     2.54 cm  LV Volumes (MOD) LV vol d, MOD A2C: 88.0 ml LV vol d, MOD A4C: 98.6 ml LV vol s, MOD A2C: 38.9 ml LV vol s, MOD A4C: 43.8 ml LV SV MOD A2C:     49.1 ml LV SV MOD A4C:     98.6 ml LV SV MOD BP:      55.9 ml RIGHT VENTRICLE            IVC RV S prime:     9.07 cm/s  IVC diam: 1.90 cm TAPSE (M-mode): 2.0 cm LEFT ATRIUM             Index        RIGHT ATRIUM           Index LA diam:        3.90 cm 2.02 cm/m   RA Area:     12.50 cm LA Vol (A2C):   63.3 ml 32.74 ml/m  RA Volume:   26.30 ml  13.60 ml/m LA Vol (A4C):   56.6 ml 29.27 ml/m LA Biplane Vol: 61.9 ml 32.01 ml/m  AORTIC VALVE AV Area (Vmax):    1.19 cm AV Area (Vmean):   1.16 cm AV Area (VTI):     1.24 cm AV Vmax:           248.50 cm/s AV Vmean:          172.000 cm/s AV VTI:            0.600 m AV Peak Grad:      24.7 mmHg AV Mean Grad:      16.0 mmHg LVOT Vmax:         116.00 cm/s LVOT Vmean:        78.400 cm/s LVOT VTI:          0.292 m LVOT/AV VTI ratio: 0.49 AI PHT:            508 msec  AORTA Ao Root diam: 3.40 cm Ao Asc diam:  3.50 cm MITRAL VALVE MV Area (PHT): 1.73 cm     SHUNTS MV Area VTI:   0.81 cm     Systemic VTI:  0.29 m MV Peak grad:  8.5 mmHg     Systemic Diam: 1.80 cm MV Mean grad:  4.0 mmHg MV Vmax:       1.46 m/s MV Vmean:      93.6 cm/s MV Decel Time: 438 msec MV E velocity: 117.00 cm/s MV A velocity: 138.00 cm/s MV E/A  ratio:  0.85 Rozann Lesches MD Electronically signed by Rozann Lesches MD Signature Date/Time: 08/26/2022/2:18:01 PM    Final    NM Hepatobiliary Liver Func  Result Date: 08/26/2022 CLINICAL DATA:  Sepsis, fever, abnormal gallbladder by CT and MR consistent with acute cholecystitis and note of a 4 mm CBD calculus versus sludge EXAM: NUCLEAR MEDICINE HEPATOBILIARY IMAGING TECHNIQUE: Sequential images of the abdomen were obtained out to 60 minutes following intravenous  administration of radiopharmaceutical. RADIOPHARMACEUTICALS:  4 mCi Tc-71m Choletec IV COMPARISON:  CT abdomen 08/25/2022, ultrasound abdomen limited 08/26/2022, MR abdomen 08/26/2022 FINDINGS: Study was performed 10 minutes following patient receiving morphine IV for pain. Normal tracer extraction from bloodstream indicating normal hepatocellular function. Large photopenic area at the RIGHT lobe of the liver corresponding to attenuation of the liver by distended gallbladder is noted on coronal CT. Prompt concentration and excretion of tracer by the liver. Duodenum visualized at 35 minutes. Gallbladder did not fill during the hour of imaging. IMPRESSION: Normal hepatocellular function with patent CBD. Nonvisualization of the gallbladder despite morphine consistent with acute cholecystitis, acalculous by ultrasound and MR. Findings called to Dr. JArnoldo Moraleon 08/26/2022 at 1124 hours. Electronically Signed   By: MLavonia DanaM.D.   On: 08/26/2022 11:24   MR ABDOMEN WITH MRCP W CONTRAST  Result Date: 08/26/2022 CLINICAL DATA:  Right upper quadrant abdominal pain, biliary ductal dilatation and abnormal gallbladder on ultrasound. EXAM: MRI ABDOMEN WITH CONTRAST (WITH MRCP) TECHNIQUE: Multiplanar multisequence MR imaging of the abdomen was performed following the administration of intravenous contrast. Heavily T2-weighted images of the biliary and pancreatic ducts were obtained, and three-dimensional MRCP images were rendered by post processing. CONTRAST:  935mGADAVIST GADOBUTROL 1 MMOL/ML IV SOLN COMPARISON:  08/26/2022 right upper quadrant abdominal sonogram. 08/25/2022 CT chest, abdomen and pelvis. FINDINGS: Lower chest: No acute abnormality at the lung bases. Hepatobiliary: Normal liver size and configuration. Mild diffuse hepatic steatosis. No liver mass. Distended gallbladder (5.2 cm diameter). Mild diffuse gallbladder wall thickening. Patchy pericholecystic fat stranding and minimal ill-defined fluid.  Gallbladder nearly filled with sludge. No gallstones. There is focal adenomyomatosis at the fundal gallbladder wall. Mild diffuse central intrahepatic biliary ductal dilatation. Dilated common bile duct with diameter 13 mm. There is a 4 mm T2 intermediate signal intensity filling defect in the lower third of the CBD. No enhancing biliary masses. No beading of the bile ducts. Pancreas: No pancreatic mass or duct dilation.  No pancreas divisum. Spleen: Normal size spleen. Small lobulated cystic 1.9 cm posterior splenic lesion with thin internal septations, compatible with a lymphangioma. No additional splenic lesions. Adrenals/Urinary Tract: Normal adrenals. No left hydronephrosis. Asymmetric mild fullness of the right renal collecting system without overt right hydronephrosis, not appreciably changed from 08/25/2022 CT. A few scattered bilateral small Bosniak category 2 hemorrhagic/proteinaceous renal cysts with internal T1 hyperintensity and no appreciable enhancement, largest 1.2 cm in the interpolar right kidney (series 20/image 68). Additional scattered simple bilateral subcentimeter renal cysts. No suspicious renal masses. Stomach/Bowel: Tiny hiatal hernia. Otherwise normal nondistended stomach. Visualized small and large bowel is normal caliber, with no bowel wall thickening. Moderate diffuse colonic diverticulosis. Vascular/Lymphatic: Atherosclerotic nonaneurysmal abdominal aorta. Patent portal, splenic, hepatic and renal veins. No pathologically enlarged lymph nodes in the abdomen. Other: No abdominal ascites or focal fluid collection. Musculoskeletal: No aggressive appearing focal osseous lesions. IMPRESSION: 1. Distended sludge filled gallbladder with mild diffuse wall thickening and pericholecystic edema. No gallstones in the gallbladder. Findings are compatible with acute acalculous cholecystitis in the correct  clinical setting. 2. Mild diffuse central intrahepatic biliary ductal dilatation. Dilated  common bile duct (13 mm diameter) with solitary 4 mm intermediate T2 signal intensity filling defect in the lower third of the CBD, compatible with either a stone or sludge. No enhancing biliary masses. 3. Mild diffuse hepatic steatosis. 4. Tiny hiatal hernia. 5. Moderate diffuse colonic diverticulosis. 6. Asymmetric mild fullness of the right renal collecting system without overt right hydronephrosis, not appreciably changed from 08/25/2022 CT. 7. Small Bosniak category 1 and category 2 renal cysts. No suspicious renal masses. Electronically Signed   By: Ilona Sorrel M.D.   On: 08/26/2022 09:28   US Abdomen Limited RUQ (LIVER/GB)  Result Date: 08/26/2022 CLINICAL DATA:  [id number] RUQ pain [id number] EXAM: ULTRASOUND ABDOMEN LIMITED RIGHT UPPER QUADRANT COMPARISON:  08/25/2022 CT chest, abdomen and pelvis FINDINGS: Gallbladder: Distended gallbladder with mild diffuse gallbladder wall thickening and with moderate layering gallbladder sludge. No shadowing gallstones demonstrated (scan limited by patient inability to roll into the left lateral decubitus position). Sonographic Murphy sign present. No convincing pericholecystic fluid. Common bile duct: Diameter: 10 mm Liver: No focal lesion identified. Within normal limits in parenchymal echogenicity. Portal vein is patent on color Doppler imaging with normal direction of blood flow towards the liver. Other: None. IMPRESSION: 1. Distended gallbladder with mild diffuse gallbladder wall thickening and moderate sludge. Sonographic Murphy sign present. No shadowing gallstones. No pericholecystic fluid. Findings are equivocal for acute acalculus cholecystitis. Consider hepatobiliary scintigraphy for further evaluation as clinically warranted. 2. Dilated common bile duct (10 mm diameter). Correlate with serum bilirubin levels. MRI abdomen with MRCP without and with IV contrast may be considered for further evaluation as clinically warranted. 3. Liver within normal limits.  Electronically Signed   By: Ilona Sorrel M.D.   On: 08/26/2022 08:05   CT CHEST ABDOMEN PELVIS W CONTRAST  Result Date: 08/25/2022 CLINICAL DATA:  Sepsis.  Fever. EXAM: CT CHEST, ABDOMEN, AND PELVIS WITH CONTRAST TECHNIQUE: Multidetector CT imaging of the chest, abdomen and pelvis was performed following the standard protocol during bolus administration of intravenous contrast. RADIATION DOSE REDUCTION: This exam was performed according to the departmental dose-optimization program which includes automated exposure control, adjustment of the mA and/or kV according to patient size and/or use of iterative reconstruction technique. CONTRAST:  140m OMNIPAQUE IOHEXOL 300 MG/ML  SOLN COMPARISON:  CT abdomen and pelvis 12/09/2019. Report only CT chest abdomen and pelvis 05/03/2017. FINDINGS: CT CHEST FINDINGS Cardiovascular: No significant vascular findings. Normal heart size. No pericardial effusion. There are atherosclerotic calcifications of the aorta. Mediastinum/Nodes: No enlarged mediastinal, hilar, or axillary lymph nodes. Thyroid gland, trachea, and esophagus demonstrate no significant findings. Lungs/Pleura: Patchy nodular airspace opacity seen in the right upper lobe centrally image 3/50 measuring 6 mm. There is a 5 mm nodule in the left lower lobe image 3/40. There is minimal atelectasis or scarring in the right lung base. The lungs are otherwise clear. There is no pleural effusion or pneumothorax. Musculoskeletal: No chest wall mass or suspicious bone lesions identified. CT ABDOMEN PELVIS FINDINGS Hepatobiliary: The gallbladder is dilated and there is gallbladder wall thickening. There is intra and extrahepatic biliary ductal dilatation. The common bile duct measures 15 mm. There is mild inflammatory stranding surrounding the gallbladder. No focal liver lesions are seen. Pancreas: Unremarkable. No pancreatic ductal dilatation or surrounding inflammatory changes. Spleen: There is a 12 mm splenic cyst or  hemangioma which is unchanged. The spleen is nonenlarged. Adrenals/Urinary Tract: There are subcentimeter cortical hypodensities in both kidneys favored  as cysts. There is no hydronephrosis or perinephric fluid. The adrenal glands and bladder are within normal limits. Stomach/Bowel: There is no evidence for bowel obstruction, pneumatosis, free air or inflammatory stranding. Again seen is sigmoid colon diverticulosis. There is questionable sigmoid colon wall thickening versus normal under distension. Stomach, small bowel and appendix are within normal limits. Vascular/Lymphatic: Aortic atherosclerosis. No enlarged abdominal or pelvic lymph nodes. Reproductive: Uterus and bilateral adnexa are unremarkable. Other: No abdominal wall hernia or abnormality. No abdominopelvic ascites. Musculoskeletal: Degenerative changes affect the spine. IMPRESSION: 1. Findings compatible with acute cholecystitis. There is intra and extrahepatic biliary ductal dilatation. 2. Questionable sigmoid colon wall thickening versus normal under distension. Correlate clinically for colitis. Follow-up colonoscopy should be considered. 3. Sigmoid colon diverticulosis. 4. Patchy nodular airspace opacity in the right upper lobe measuring 6 mm and 5 mm nodule in the left lower lobe. Non-contrast chest CT at 3-6 months is recommended. If the nodules are stable at time of repeat CT, then future CT at 18-24 months (from today's scan) is considered optional for low-risk patients, but is recommended for high-risk patients. This recommendation follows the consensus statement: Guidelines for Management of Incidental Pulmonary Nodules Detected on CT Images: From the Fleischner Society 2017; Radiology 2017; 284:228-243. Aortic Atherosclerosis (ICD10-I70.0). Electronically Signed   By: Ronney Asters M.D.   On: 08/25/2022 22:42   DG Chest Port 1 View  Result Date: 08/25/2022 CLINICAL DATA:  Sepsis EXAM: PORTABLE CHEST 1 VIEW COMPARISON:  04/03/2022  FINDINGS: Mild right basilar scarring/atelectasis. Left lung is clear No pleural effusion or pneumothorax. The heart normal in size. Thoracic aortic atherosclerosis. IMPRESSION: No evidence of acute cardiopulmonary disease. Electronically Signed   By: Julian Hy M.D.   On: 08/25/2022 20:30    Labs:  CBC: Recent Labs    04/03/22 1204 08/25/22 2018 08/26/22 0155 08/27/22 0217  WBC 9.7 22.9* 20.8* 18.4*  HGB 13.6 13.5 11.7* 11.7*  HCT 40.9 40.1 35.6* 35.3*  PLT 208 256 188 174    COAGS: Recent Labs    08/25/22 2018  INR 1.1    BMP: Recent Labs    08/25/22 2018 08/26/22 0155 08/26/22 0903 08/27/22 0217  NA 135 138 139 140  K 3.4* 3.5 3.8 3.5  CL 100 104 109 112*  CO2 '25 26 25 22  '$ GLUCOSE 131* 119* 113* 118*  BUN '14 15 16 12  '$ CALCIUM 8.9 8.1* 7.7* 8.0*  CREATININE 0.91 1.00 1.04* 0.99  GFRNONAA >60 >60 58* >60    LIVER FUNCTION TESTS: Recent Labs    04/03/22 1204 08/25/22 2018 08/26/22 0155 08/27/22 0217  BILITOT 0.7 0.5 0.4 0.7  AST '15 26 24 23  '$ ALT '16 19 15 15  '$ ALKPHOS 81 98 71 61  PROT 6.7 7.7 6.0* 6.0*  ALBUMIN 3.5 3.8 2.9* 2.5*    TUMOR MARKERS: No results for input(s): "AFPTM", "CEA", "CA199", "CHROMGRNA" in the last 8760 hours.  Assessment and Plan:   Acute Cholecystitis Patient presented to [facility] ED with acute cholecystitis. IR was consulted and patient subsequently transferred to [facility] for evaluation. The case was reviewed by Dr Kathlene Cote and approved for image-guided percutaneous cholecystostomy tube placement tentatively for 08/27/22.  Risks and benefits discussed with the patient's son including, but not limited to bleeding, infection, gallbladder perforation, bile leak, sepsis or even death.  All of the patient's son's questions were answered, patient is agreeable to proceed. Consent signed and in IR suite.   Thank you for this interesting consult.  I greatly  enjoyed meeting Kathleen Valenzuela and look forward to participating in their  care.  A copy of this report was sent to the requesting provider on this date.  Electronically Signed: Lura Em, PA-C 08/27/2022, 8:20 AM   I spent a total of 40 Minutes    in face to face in clinical consultation, greater than 50% of which was counseling/coordinating care for acute cholecystitis.

## 2022-08-27 NOTE — Progress Notes (Signed)
PROGRESS NOTE    Kathleen Valenzuela  MGQ:[id number] DOB: 08-19-1951 DOA: 08/25/2022 PCP: Monico Blitz, MD   Brief Narrative:  71 y.o. female with medical history significant of anemia, anxiety, bipolar 1 disorder, COPD, GERD, heart murmur, hyperlipidemia, hypertension, and more presents ED with a chief complaint of altered mental status.  Unfortunately patient is not able to provide history due to her dementia.  Her son was here earlier and reported to staff that he had spoke to her and she was at her normal baseline.  Later her SNF called and reported that she was altered and coming to the ER.  He reports that she has had some episodes of emesis.  No further history could be obtained.  Son reports this is not patient's baseline.  In the ED they treated her for sepsis, identified the source is acute cholecystitis and used cefepime and Flagyl for antibiotics as patient has a penicillin allergy.  She received fluids.  With these treatments patient did start to improve.  Pt discovered after work up to have acute acalculous cholecystitis.  Dr. Arnoldo Morale evaluated patient and determined next best step is to have cholecystostomy tube placed.  IR requested patient transfer to [facility] to have tube placement,  Assessment & Plan:   Sepsis in the setting of acute cholecystitis: -Patient has fever of 104.2, tachycardic, tachypneic, lactic acid of 2.8 with leukocytosis.   -Reviewed CT abdomen, pelvis shows acute cholecystitis. -Continue cefepime and Flagyl.  Leukocytosis trending down.  Lactic acid: WNL -MRCP and HIDA scan ordered and reviewed by general surgery who recommended cholecystostomy tube placement given her multiple comorbidities. -IR on board-plan is cholecystostomy tube placement on 12/23.  Patient is n.p.o. for the procedure -Continue as needed pain medications.  Continue to hold Eliquis.  Acute metabolic encephalopathy: -Likely in the setting of sepsis due to acute cholecystitis -Will continue to  monitor. -Will consult PT/OT/SLP once she is clinically stable -Continue supportive care  Dementia with behavioral disturbance (HCC) -Continue Namenda and Aricept   Atrial fibrillation, chronic (HCC) - Holding Eliquis, continue metoprolol -Monitored on telemetry   Diastolic dysfunction with heart failure (HCC) -Continue atorvastatin, Imdur, metoprolol - Does not clinically appear to be in exacerbation at this time; follow up preoperative TTE   Hypokalemia -Replenished   GERD (gastroesophageal reflux disease) - Continue Protonix   High cholesterol -Continue statin   Bipolar disorder ([place]) - Continue Latuda, trazodone, Wellbutrin  DVT prophylaxis: SCD Code Status: Full code Family Communication:  None present at bedside.  Plan of care discussed with patient in length and she verbalized understanding and agreed with it. Disposition Plan: To be determined  Consultants:  General surgery IR  Procedures:  See above  Antimicrobials:  Cefepime Flagyl  Status is: Inpatient      Subjective: Patient seen and examined.  Continues to have right upper quadrant pain.  She is sleepy but arousable and asking for water.  Tmax: 101.1.  She is n.p.o. for the procedure.  Objective: Vitals:   08/27/22 0314 08/27/22 0458 08/27/22 0743 08/27/22 1024  BP: 109/61 (!) 119/57 (!) 107/53 118/60  Pulse: 93 97 92 86  Resp: '18 18 17   '$ Temp: (!) 101.1 F (38.4 C) 98.7 F (37.1 C) 98 F (36.7 C)   TempSrc: Oral Oral    SpO2: 95% 91% 91%   Weight:      Height:        Intake/Output Summary (Last 24 hours) at 08/27/2022 1205 Last data filed at 08/26/2022 1540 Gross  per 24 hour  Intake 195 ml  Output 800 ml  Net -605 ml   Filed Weights   08/25/22 2004 08/26/22 1903  Weight: 81.6 kg 78.9 kg    Examination:  General exam: Appears calm and comfortable, on room air Sleepy but arousable Respiratory system: Clear to auscultation. Respiratory effort normal. Cardiovascular  system: S1 & S2 heard, RRR. No JVD, murmurs, rubs, gallops or clicks. No pedal edema. Gastrointestinal system: Abdomen is obese, nondistended, soft, right upper quadrant abdominal tenderness positive, no guarding, no rigidity. Central nervous system: Alert and oriented.  Extremities: Symmetric 5 x 5 power. Skin: No rashes, lesions or ulcers    Data Reviewed: I have personally reviewed following labs and imaging studies  CBC: Recent Labs  Lab 08/25/22 2018 08/26/22 0155 08/27/22 0217  WBC 22.9* 20.8* 18.4*  NEUTROABS 20.3* 18.4* 16.3*  HGB 13.5 11.7* 11.7*  HCT 40.1 35.6* 35.3*  MCV 88.5 91.5 89.6  PLT 256 188 846   Basic Metabolic Panel: Recent Labs  Lab 08/25/22 2018 08/26/22 0155 08/26/22 0903 08/27/22 0217  NA 135 138 139 140  K 3.4* 3.5 3.8 3.5  CL 100 104 109 112*  CO2 '25 26 25 22  '$ GLUCOSE 131* 119* 113* 118*  BUN '14 15 16 12  '$ CREATININE 0.91 1.00 1.04* 0.99  CALCIUM 8.9 8.1* 7.7* 8.0*  MG  --  1.4*  --  2.3   GFR: Estimated Creatinine Clearance: 57.2 mL/min (by C-G formula based on SCr of 0.99 mg/dL). Liver Function Tests: Recent Labs  Lab 08/25/22 2018 08/26/22 0155 08/27/22 0217  AST '26 24 23  '$ ALT '19 15 15  '$ ALKPHOS 98 71 61  BILITOT 0.5 0.4 0.7  PROT 7.7 6.0* 6.0*  ALBUMIN 3.8 2.9* 2.5*   Recent Labs  Lab 08/25/22 2200  LIPASE 32   No results for input(s): "AMMONIA" in the last 168 hours. Coagulation Profile: Recent Labs  Lab 08/25/22 2018  INR 1.1   Cardiac Enzymes: No results for input(s): "CKTOTAL", "CKMB", "CKMBINDEX", "TROPONINI" in the last 168 hours. BNP (last 3 results) No results for input(s): "PROBNP" in the last 8760 hours. HbA1C: No results for input(s): "HGBA1C" in the last 72 hours. CBG: No results for input(s): "GLUCAP" in the last 168 hours. Lipid Profile: No results for input(s): "CHOL", "HDL", "LDLCALC", "TRIG", "CHOLHDL", "LDLDIRECT" in the last 72 hours. Thyroid Function Tests: No results for input(s): "TSH",  "T4TOTAL", "FREET4", "T3FREE", "THYROIDAB" in the last 72 hours. Anemia Panel: No results for input(s): "VITAMINB12", "FOLATE", "FERRITIN", "TIBC", "IRON", "RETICCTPCT" in the last 72 hours. Sepsis Labs: Recent Labs  Lab 08/25/22 2018 08/25/22 2200 08/26/22 0155 08/26/22 0807  LATICACIDVEN 2.8* 2.5* 2.3* 1.5    Recent Results (from the past 240 hour(s))  Blood Culture (routine x 2)     Status: None (Preliminary result)   Collection Time: 08/25/22  8:12 PM   Specimen: BLOOD  Result Value Ref Range Status   Specimen Description BLOOD BLOOD RIGHT [place]  Final   Special Requests   Final    BOTTLES DRAWN AEROBIC AND ANAEROBIC Blood Culture adequate volume   Culture   Final    NO GROWTH 2 DAYS Performed at [facility], [address], [place], [place] [address]    Report Status PENDING  Incomplete  Blood Culture (routine x 2)     Status: None (Preliminary result)   Collection Time: 08/25/22  8:18 PM   Specimen: BLOOD  Result Value Ref Range Status   Specimen Description BLOOD  BLOOD LEFT ARM  Final   Special Requests   Final    BOTTLES DRAWN AEROBIC AND ANAEROBIC Blood Culture adequate volume   Culture   Final    NO GROWTH 2 DAYS Performed at [facility], [address], [place], [place] [address]    Report Status PENDING  Incomplete  Resp panel by RT-PCR (RSV, Flu A&B, Covid) Anterior Nasal Swab     Status: None   Collection Time: 08/25/22  8:33 PM   Specimen: Anterior Nasal Swab  Result Value Ref Range Status   SARS Coronavirus 2 by RT PCR NEGATIVE NEGATIVE Final    Comment: (NOTE) SARS-CoV-2 target nucleic acids are NOT DETECTED.  The SARS-CoV-2 RNA is generally detectable in upper respiratory specimens during the acute phase of infection. The lowest concentration of SARS-CoV-2 viral copies this assay can detect is 138 copies/mL. A negative result does not preclude SARS-Cov-2 infection and should not be used as the sole basis for treatment or other patient management  decisions. A negative result may occur with  improper specimen collection/handling, submission of specimen other than nasopharyngeal swab, presence of viral mutation(s) within the areas targeted by this assay, and inadequate number of viral copies(<138 copies/mL). A negative result must be combined with clinical observations, patient history, and epidemiological information. The expected result is Negative.  Fact Sheet for Patients:  EntrepreneurPulse.com.au  Fact Sheet for Healthcare Providers:  IncredibleEmployment.be  This test is no t yet approved or cleared by the [place] FDA and  has been authorized for detection and/or diagnosis of SARS-CoV-2 by FDA under an Emergency Use Authorization (EUA). This EUA will remain  in effect (meaning this test can be used) for the duration of the COVID-19 declaration under Section 564(b)(1) of the Act, 21 U.S.C.section 360bbb-3(b)(1), unless the authorization is terminated  or revoked sooner.       Influenza A by PCR NEGATIVE NEGATIVE Final   Influenza B by PCR NEGATIVE NEGATIVE Final    Comment: (NOTE) The Xpert Xpress SARS-CoV-2/FLU/RSV plus assay is intended as an aid in the diagnosis of influenza from Nasopharyngeal swab specimens and should not be used as a sole basis for treatment. Nasal washings and aspirates are unacceptable for Xpert Xpress SARS-CoV-2/FLU/RSV testing.  Fact Sheet for Patients: EntrepreneurPulse.com.au  Fact Sheet for Healthcare Providers: IncredibleEmployment.be  This test is not yet approved or cleared by the [place] FDA and has been authorized for detection and/or diagnosis of SARS-CoV-2 by FDA under an Emergency Use Authorization (EUA). This EUA will remain in effect (meaning this test can be used) for the duration of the COVID-19 declaration under Section 564(b)(1) of the Act, 21 U.S.C. section 360bbb-3(b)(1), unless the  authorization is terminated or revoked.     Resp Syncytial Virus by PCR NEGATIVE NEGATIVE Final    Comment: (NOTE) Fact Sheet for Patients: EntrepreneurPulse.com.au  Fact Sheet for Healthcare Providers: IncredibleEmployment.be  This test is not yet approved or cleared by the [place] FDA and has been authorized for detection and/or diagnosis of SARS-CoV-2 by FDA under an Emergency Use Authorization (EUA). This EUA will remain in effect (meaning this test can be used) for the duration of the COVID-19 declaration under Section 564(b)(1) of the Act, 21 U.S.C. section 360bbb-3(b)(1), unless the authorization is terminated or revoked.  Performed at [facility], [address] [place], [place] [address]   Urine Culture     Status: Abnormal   Collection Time: 08/25/22  9:24 PM   Specimen: Urine, Clean Catch  Result  Value Ref Range Status   Specimen Description   Final    URINE, CLEAN CATCH Performed at [facility], [address], [place], [place] [address]    Special Requests   Final    NONE Performed at [facility], [address], [place], [place] [address]    Culture (A)  Final    <10,000 COLONIES/mL INSIGNIFICANT GROWTH Performed at [facility] [address], [place], [place] [address]    Report Status 08/27/2022 FINAL  Final      Radiology Studies: ECHOCARDIOGRAM COMPLETE  Result Date: 08/26/2022    ECHOCARDIOGRAM REPORT   Patient Name:   ELOYSE CAUSEY Siebers Date of Exam: 08/26/2022 Medical Rec #:  [id number]     Height:       67.0 in Accession #:    [id number]    Weight:       180.0 lb Date of Birth:  01-05-1951      BSA:          1.934 m Patient Age:    51 years      BP:           102/52 mmHg Patient Gender: F             HR:           57 bpm. Exam Location:  [facility] Procedure: 2D Echo, Cardiac Doppler and Color Doppler Indications:    Pre operative clearance  History:        Patient has prior history of Echocardiogram  examinations, most                 recent 04/14/2020. CHF, COPD, Signs/Symptoms:Murmur, Alzheimer's,                 Shortness of Breath and Dyspnea; Risk Factors:Dyslipidemia and                 Hypertension.  Sonographer:    Eartha Inch Referring Phys: Tra[place] Comments: Technically difficult study due to poor echo windows. Image acquisition challenging due to patient body habitus and Image acquisition challenging due to respiratory motion. IMPRESSIONS  1. Left ventricular ejection fraction, by estimation, is 60 to 65%. The left ventricle has normal function. The left ventricle has no regional wall motion abnormalities. Left ventricular diastolic parameters are indeterminate.  2. Right ventricular systolic function is normal. The right ventricular size is normal. Tricuspid regurgitation signal is inadequate for assessing PA pressure.  3. Left atrial size was upper normal.  4. The mitral valve is degenerative. Mild mitral valve regurgitation. Mild mitral stenosis. The mean mitral valve gradient is 4.0 mmHg. Moderate mitral annular calcification.  5. The aortic valve is tricuspid. There is moderate calcification of the aortic valve. Aortic valve regurgitation is mild. Mild to moderate aortic valve stenosis. Aortic valve area, by VTI measures 1.24 cm. Aortic valve mean gradient measures 16.0 mmHg. Dimentionless index 0.49.  6. The inferior vena cava is normal in size with <50% respiratory variability, suggesting right atrial pressure of 8 mmHg. Comparison(s): Prior images reviewed side by side. Mild progression in aortic stenosis, mild to moderate range. There is also mild mitral stenosis. FINDINGS  Left Ventricle: Left ventricular ejection fraction, by estimation, is 60 to 65%. The left ventricle has normal function. The left ventricle has no regional wall motion abnormalities. The left ventricular internal cavity size was normal in size. There is  borderline left ventricular  hypertrophy. Left ventricular diastolic function could  not be evaluated due to mitral annular calcification (moderate or greater). Left ventricular diastolic parameters are indeterminate. Right Ventricle: The right ventricular size is normal. No increase in right ventricular wall thickness. Right ventricular systolic function is normal. Tricuspid regurgitation signal is inadequate for assessing PA pressure. Left Atrium: Left atrial size was upper normal. Right Atrium: Right atrial size was normal in size. Pericardium: There is no evidence of pericardial effusion. Mitral Valve: The mitral valve is degenerative in appearance. There is mild thickening of the mitral valve leaflet(s). There is mild calcification of the mitral valve leaflet(s). Moderate mitral annular calcification. Mild mitral valve regurgitation. Mild mitral valve stenosis. MV peak gradient, 8.5 mmHg. The mean mitral valve gradient is 4.0 mmHg. Tricuspid Valve: The tricuspid valve is grossly normal. Tricuspid valve regurgitation is mild . No evidence of tricuspid stenosis. Aortic Valve: The aortic valve is tricuspid. There is moderate calcification of the aortic valve. There is mild to moderate aortic valve annular calcification. Aortic valve regurgitation is mild. Aortic regurgitation PHT measures 508 msec. Mild to moderate aortic stenosis is present. Aortic valve mean gradient measures 16.0 mmHg. Aortic valve peak gradient measures 24.7 mmHg. Aortic valve area, by VTI measures 1.24 cm. Pulmonic Valve: The pulmonic valve was not well visualized. Pulmonic valve regurgitation is not visualized. No evidence of pulmonic stenosis. Aorta: The aortic root is normal in size and structure. Venous: The inferior vena cava is normal in size with less than 50% respiratory variability, suggesting right atrial pressure of 8 mmHg. IAS/Shunts: No atrial level shunt detected by color flow Doppler.  LEFT VENTRICLE PLAX 2D LVIDd:         3.90 cm     Diastology LVIDs:          2.90 cm     LV e' medial:    4.26 cm/s LV PW:         1.10 cm     LV E/e' medial:  27.5 LV IVS:        1.00 cm     LV e' lateral:   3.09 cm/s LVOT diam:     1.80 cm     LV E/e' lateral: 37.9 LV SV:         74 LV SV Index:   38 LVOT Area:     2.54 cm  LV Volumes (MOD) LV vol d, MOD A2C: 88.0 ml LV vol d, MOD A4C: 98.6 ml LV vol s, MOD A2C: 38.9 ml LV vol s, MOD A4C: 43.8 ml LV SV MOD A2C:     49.1 ml LV SV MOD A4C:     98.6 ml LV SV MOD BP:      55.9 ml RIGHT VENTRICLE            IVC RV S prime:     9.07 cm/s  IVC diam: 1.90 cm TAPSE (M-mode): 2.0 cm LEFT ATRIUM             Index        RIGHT ATRIUM           Index LA diam:        3.90 cm 2.02 cm/m   RA Area:     12.50 cm LA Vol (A2C):   63.3 ml 32.74 ml/m  RA Volume:   26.30 ml  13.60 ml/m LA Vol (A4C):   56.6 ml 29.27 ml/m LA Biplane Vol: 61.9 ml 32.01 ml/m  AORTIC VALVE AV Area (Vmax):    1.19 cm AV Area (Vmean):  1.16 cm AV Area (VTI):     1.24 cm AV Vmax:           248.50 cm/s AV Vmean:          172.000 cm/s AV VTI:            0.600 m AV Peak Grad:      24.7 mmHg AV Mean Grad:      16.0 mmHg LVOT Vmax:         116.00 cm/s LVOT Vmean:        78.400 cm/s LVOT VTI:          0.292 m LVOT/AV VTI ratio: 0.49 AI PHT:            508 msec  AORTA Ao Root diam: 3.40 cm Ao Asc diam:  3.50 cm MITRAL VALVE MV Area (PHT): 1.73 cm     SHUNTS MV Area VTI:   0.81 cm     Systemic VTI:  0.29 m MV Peak grad:  8.5 mmHg     Systemic Diam: 1.80 cm MV Mean grad:  4.0 mmHg MV Vmax:       1.46 m/s MV Vmean:      93.6 cm/s MV Decel Time: 438 msec MV E velocity: 117.00 cm/s MV A velocity: 138.00 cm/s MV E/A ratio:  0.85 Rozann Lesches MD Electronically signed by Rozann Lesches MD Signature Date/Time: 08/26/2022/2:18:01 PM    Final    NM Hepatobiliary Liver Func  Result Date: 08/26/2022 CLINICAL DATA:  Sepsis, fever, abnormal gallbladder by CT and MR consistent with acute cholecystitis and note of a 4 mm CBD calculus versus sludge EXAM: NUCLEAR MEDICINE HEPATOBILIARY  IMAGING TECHNIQUE: Sequential images of the abdomen were obtained out to 60 minutes following intravenous administration of radiopharmaceutical. RADIOPHARMACEUTICALS:  4 mCi Tc-54m Choletec IV COMPARISON:  CT abdomen 08/25/2022, ultrasound abdomen limited 08/26/2022, MR abdomen 08/26/2022 FINDINGS: Study was performed 10 minutes following patient receiving morphine IV for pain. Normal tracer extraction from bloodstream indicating normal hepatocellular function. Large photopenic area at the RIGHT lobe of the liver corresponding to attenuation of the liver by distended gallbladder is noted on coronal CT. Prompt concentration and excretion of tracer by the liver. Duodenum visualized at 35 minutes. Gallbladder did not fill during the hour of imaging. IMPRESSION: Normal hepatocellular function with patent CBD. Nonvisualization of the gallbladder despite morphine consistent with acute cholecystitis, acalculous by ultrasound and MR. Findings called to Dr. JArnoldo Moraleon 08/26/2022 at 1124 hours. Electronically Signed   By: MLavonia DanaM.D.   On: 08/26/2022 11:24   MR ABDOMEN WITH MRCP W CONTRAST  Result Date: 08/26/2022 CLINICAL DATA:  Right upper quadrant abdominal pain, biliary ductal dilatation and abnormal gallbladder on ultrasound. EXAM: MRI ABDOMEN WITH CONTRAST (WITH MRCP) TECHNIQUE: Multiplanar multisequence MR imaging of the abdomen was performed following the administration of intravenous contrast. Heavily T2-weighted images of the biliary and pancreatic ducts were obtained, and three-dimensional MRCP images were rendered by post processing. CONTRAST:  950mGADAVIST GADOBUTROL 1 MMOL/ML IV SOLN COMPARISON:  08/26/2022 right upper quadrant abdominal sonogram. 08/25/2022 CT chest, abdomen and pelvis. FINDINGS: Lower chest: No acute abnormality at the lung bases. Hepatobiliary: Normal liver size and configuration. Mild diffuse hepatic steatosis. No liver mass. Distended gallbladder (5.2 cm diameter). Mild diffuse  gallbladder wall thickening. Patchy pericholecystic fat stranding and minimal ill-defined fluid. Gallbladder nearly filled with sludge. No gallstones. There is focal adenomyomatosis at the fundal gallbladder wall. Mild diffuse central intrahepatic biliary ductal dilatation. Dilated common  bile duct with diameter 13 mm. There is a 4 mm T2 intermediate signal intensity filling defect in the lower third of the CBD. No enhancing biliary masses. No beading of the bile ducts. Pancreas: No pancreatic mass or duct dilation.  No pancreas divisum. Spleen: Normal size spleen. Small lobulated cystic 1.9 cm posterior splenic lesion with thin internal septations, compatible with a lymphangioma. No additional splenic lesions. Adrenals/Urinary Tract: Normal adrenals. No left hydronephrosis. Asymmetric mild fullness of the right renal collecting system without overt right hydronephrosis, not appreciably changed from 08/25/2022 CT. A few scattered bilateral small Bosniak category 2 hemorrhagic/proteinaceous renal cysts with internal T1 hyperintensity and no appreciable enhancement, largest 1.2 cm in the interpolar right kidney (series 20/image 68). Additional scattered simple bilateral subcentimeter renal cysts. No suspicious renal masses. Stomach/Bowel: Tiny hiatal hernia. Otherwise normal nondistended stomach. Visualized small and large bowel is normal caliber, with no bowel wall thickening. Moderate diffuse colonic diverticulosis. Vascular/Lymphatic: Atherosclerotic nonaneurysmal abdominal aorta. Patent portal, splenic, hepatic and renal veins. No pathologically enlarged lymph nodes in the abdomen. Other: No abdominal ascites or focal fluid collection. Musculoskeletal: No aggressive appearing focal osseous lesions. IMPRESSION: 1. Distended sludge filled gallbladder with mild diffuse wall thickening and pericholecystic edema. No gallstones in the gallbladder. Findings are compatible with acute acalculous cholecystitis in the  correct clinical setting. 2. Mild diffuse central intrahepatic biliary ductal dilatation. Dilated common bile duct (13 mm diameter) with solitary 4 mm intermediate T2 signal intensity filling defect in the lower third of the CBD, compatible with either a stone or sludge. No enhancing biliary masses. 3. Mild diffuse hepatic steatosis. 4. Tiny hiatal hernia. 5. Moderate diffuse colonic diverticulosis. 6. Asymmetric mild fullness of the right renal collecting system without overt right hydronephrosis, not appreciably changed from 08/25/2022 CT. 7. Small Bosniak category 1 and category 2 renal cysts. No suspicious renal masses. Electronically Signed   By: Ilona Sorrel M.D.   On: 08/26/2022 09:28   US Abdomen Limited RUQ (LIVER/GB)  Result Date: 08/26/2022 CLINICAL DATA:  [id number] RUQ pain [id number] EXAM: ULTRASOUND ABDOMEN LIMITED RIGHT UPPER QUADRANT COMPARISON:  08/25/2022 CT chest, abdomen and pelvis FINDINGS: Gallbladder: Distended gallbladder with mild diffuse gallbladder wall thickening and with moderate layering gallbladder sludge. No shadowing gallstones demonstrated (scan limited by patient inability to roll into the left lateral decubitus position). Sonographic Murphy sign present. No convincing pericholecystic fluid. Common bile duct: Diameter: 10 mm Liver: No focal lesion identified. Within normal limits in parenchymal echogenicity. Portal vein is patent on color Doppler imaging with normal direction of blood flow towards the liver. Other: None. IMPRESSION: 1. Distended gallbladder with mild diffuse gallbladder wall thickening and moderate sludge. Sonographic Murphy sign present. No shadowing gallstones. No pericholecystic fluid. Findings are equivocal for acute acalculus cholecystitis. Consider hepatobiliary scintigraphy for further evaluation as clinically warranted. 2. Dilated common bile duct (10 mm diameter). Correlate with serum bilirubin levels. MRI abdomen with MRCP without and with IV contrast may  be considered for further evaluation as clinically warranted. 3. Liver within normal limits. Electronically Signed   By: Ilona Sorrel M.D.   On: 08/26/2022 08:05   CT CHEST ABDOMEN PELVIS W CONTRAST  Result Date: 08/25/2022 CLINICAL DATA:  Sepsis.  Fever. EXAM: CT CHEST, ABDOMEN, AND PELVIS WITH CONTRAST TECHNIQUE: Multidetector CT imaging of the chest, abdomen and pelvis was performed following the standard protocol during bolus administration of intravenous contrast. RADIATION DOSE REDUCTION: This exam was performed according to the departmental dose-optimization program which includes automated exposure control, adjustment  of the mA and/or kV according to patient size and/or use of iterative reconstruction technique. CONTRAST:  164m OMNIPAQUE IOHEXOL 300 MG/ML  SOLN COMPARISON:  CT abdomen and pelvis 12/09/2019. Report only CT chest abdomen and pelvis 05/03/2017. FINDINGS: CT CHEST FINDINGS Cardiovascular: No significant vascular findings. Normal heart size. No pericardial effusion. There are atherosclerotic calcifications of the aorta. Mediastinum/Nodes: No enlarged mediastinal, hilar, or axillary lymph nodes. Thyroid gland, trachea, and esophagus demonstrate no significant findings. Lungs/Pleura: Patchy nodular airspace opacity seen in the right upper lobe centrally image 3/50 measuring 6 mm. There is a 5 mm nodule in the left lower lobe image 3/40. There is minimal atelectasis or scarring in the right lung base. The lungs are otherwise clear. There is no pleural effusion or pneumothorax. Musculoskeletal: No chest wall mass or suspicious bone lesions identified. CT ABDOMEN PELVIS FINDINGS Hepatobiliary: The gallbladder is dilated and there is gallbladder wall thickening. There is intra and extrahepatic biliary ductal dilatation. The common bile duct measures 15 mm. There is mild inflammatory stranding surrounding the gallbladder. No focal liver lesions are seen. Pancreas: Unremarkable. No pancreatic  ductal dilatation or surrounding inflammatory changes. Spleen: There is a 12 mm splenic cyst or hemangioma which is unchanged. The spleen is nonenlarged. Adrenals/Urinary Tract: There are subcentimeter cortical hypodensities in both kidneys favored as cysts. There is no hydronephrosis or perinephric fluid. The adrenal glands and bladder are within normal limits. Stomach/Bowel: There is no evidence for bowel obstruction, pneumatosis, free air or inflammatory stranding. Again seen is sigmoid colon diverticulosis. There is questionable sigmoid colon wall thickening versus normal under distension. Stomach, small bowel and appendix are within normal limits. Vascular/Lymphatic: Aortic atherosclerosis. No enlarged abdominal or pelvic lymph nodes. Reproductive: Uterus and bilateral adnexa are unremarkable. Other: No abdominal wall hernia or abnormality. No abdominopelvic ascites. Musculoskeletal: Degenerative changes affect the spine. IMPRESSION: 1. Findings compatible with acute cholecystitis. There is intra and extrahepatic biliary ductal dilatation. 2. Questionable sigmoid colon wall thickening versus normal under distension. Correlate clinically for colitis. Follow-up colonoscopy should be considered. 3. Sigmoid colon diverticulosis. 4. Patchy nodular airspace opacity in the right upper lobe measuring 6 mm and 5 mm nodule in the left lower lobe. Non-contrast chest CT at 3-6 months is recommended. If the nodules are stable at time of repeat CT, then future CT at 18-24 months (from today's scan) is considered optional for low-risk patients, but is recommended for high-risk patients. This recommendation follows the consensus statement: Guidelines for Management of Incidental Pulmonary Nodules Detected on CT Images: From the Fleischner Society 2017; Radiology 2017; 284:228-243. Aortic Atherosclerosis (ICD10-I70.0). Electronically Signed   By: ARonney AstersM.D.   On: 08/25/2022 22:42   DG Chest Port 1 View  Result Date:  08/25/2022 CLINICAL DATA:  Sepsis EXAM: PORTABLE CHEST 1 VIEW COMPARISON:  04/03/2022 FINDINGS: Mild right basilar scarring/atelectasis. Left lung is clear No pleural effusion or pneumothorax. The heart normal in size. Thoracic aortic atherosclerosis. IMPRESSION: No evidence of acute cardiopulmonary disease. Electronically Signed   By: SJulian HyM.D.   On: 08/25/2022 20:30    Scheduled Meds:  atorvastatin  40 mg Oral QHS   benztropine  0.5 mg Oral BID   buPROPion  150 mg Oral q morning   donepezil  10 mg Oral Q2000   fluticasone  1 spray Each Nare Daily   fluticasone furoate-vilanterol  1 puff Inhalation Daily   gabapentin  300 mg Oral TID   isosorbide mononitrate  15 mg Oral Daily   lurasidone  80 mg Oral QHS   memantine  10 mg Oral BID   metoprolol succinate  12.5 mg Oral Daily   pantoprazole  40 mg Oral BID AC   tiZANidine  2 mg Oral BID   traZODone  100 mg Oral QHS   venlafaxine XR  300 mg Oral Daily   Continuous Infusions:  sodium chloride 125 mL/hr at 08/27/22 0516   ceFEPime (MAXIPIME) IV 2 g (08/27/22 0522)   metronidazole 500 mg (08/27/22 0022)     LOS: 2 days   Time spent: 35 minutes   Myeshia Fojtik Loann Quill, MD [facility]  If 7PM-7AM, please contact night-coverage [email or web address] 08/27/2022, 12:05 PM

## 2022-08-28 DIAGNOSIS — K810 Acute cholecystitis: Secondary | ICD-10-CM | POA: Diagnosis not present

## 2022-08-28 LAB — CBC WITH DIFFERENTIAL/PLATELET
Abs Immature Granulocytes: 0.09 10*3/uL — ABNORMAL HIGH (ref 0.00–0.07)
Basophils Absolute: 0 10*3/uL (ref 0.0–0.1)
Basophils Relative: 0 %
Eosinophils Absolute: 0 10*3/uL (ref 0.0–0.5)
Eosinophils Relative: 0 %
HCT: 29.7 % — ABNORMAL LOW (ref 36.0–46.0)
Hemoglobin: 9.7 g/dL — ABNORMAL LOW (ref 12.0–15.0)
Immature Granulocytes: 1 %
Lymphocytes Relative: 9 %
Lymphs Abs: 1.2 10*3/uL (ref 0.7–4.0)
MCH: 30.2 pg (ref 26.0–34.0)
MCHC: 32.7 g/dL (ref 30.0–36.0)
MCV: 92.5 fL (ref 80.0–100.0)
Monocytes Absolute: 1 10*3/uL (ref 0.1–1.0)
Monocytes Relative: 7 %
Neutro Abs: 11.3 10*3/uL — ABNORMAL HIGH (ref 1.7–7.7)
Neutrophils Relative %: 83 %
Platelets: 142 10*3/uL — ABNORMAL LOW (ref 150–400)
RBC: 3.21 MIL/uL — ABNORMAL LOW (ref 3.87–5.11)
RDW: 14.9 % (ref 11.5–15.5)
WBC: 13.6 10*3/uL — ABNORMAL HIGH (ref 4.0–10.5)
nRBC: 0 % (ref 0.0–0.2)

## 2022-08-28 LAB — COMPREHENSIVE METABOLIC PANEL
ALT: 14 U/L (ref 0–44)
AST: 20 U/L (ref 15–41)
Albumin: 2.1 g/dL — ABNORMAL LOW (ref 3.5–5.0)
Alkaline Phosphatase: 54 U/L (ref 38–126)
Anion gap: 5 (ref 5–15)
BUN: 17 mg/dL (ref 8–23)
CO2: 21 mmol/L — ABNORMAL LOW (ref 22–32)
Calcium: 8.1 mg/dL — ABNORMAL LOW (ref 8.9–10.3)
Chloride: 115 mmol/L — ABNORMAL HIGH (ref 98–111)
Creatinine, Ser: 1 mg/dL (ref 0.44–1.00)
GFR, Estimated: >60 mL/min (ref >60)
Glucose, Bld: 99 mg/dL (ref 70–99)
Potassium: 3.5 mmol/L (ref 3.5–5.1)
Sodium: 141 mmol/L (ref 135–145)
Total Bilirubin: 0.9 mg/dL (ref 0.3–1.2)
Total Protein: 5.5 g/dL — ABNORMAL LOW (ref 6.5–8.1)

## 2022-08-28 MED ORDER — APIXABAN 5 MG PO TABS
5.0000 mg | ORAL_TABLET | Freq: Two times a day (BID) | ORAL | Status: DC
  Administered 2022-08-28 – 2022-09-09 (×24): 5 mg via ORAL
  Filled 2022-08-28 (×24): qty 1

## 2022-08-28 NOTE — Progress Notes (Signed)
PROGRESS NOTE    Kathleen Valenzuela  KZS:[id number] DOB: [birth year] DOA: 08/25/2022 PCP: Monico Blitz, MD   Brief Narrative:  71 y.o. female with medical history significant of anemia, anxiety, bipolar 1 disorder, COPD, GERD, heart murmur, hyperlipidemia, hypertension, and more presents ED with a chief complaint of altered mental status.  Unfortunately patient is not able to provide history due to her dementia.  Her son was here earlier and reported to staff that he had spoke to her and she was at her normal baseline.  Later her SNF called and reported that she was altered and coming to the ER.  He reports that she has had some episodes of emesis.  No further history could be obtained.  Son reports this is not patient's baseline.  In the ED they treated her for sepsis, identified the source is acute cholecystitis and used cefepime and Flagyl for antibiotics as patient has a penicillin allergy.  She received fluids.  With these treatments patient did start to improve.  Pt discovered after work up to have acute acalculous cholecystitis.  Dr. Arnoldo Morale evaluated patient and determined next best step is to have cholecystostomy tube placed.  IR requested patient transfer to [facility] to have tube placement,  Assessment & Plan:   Sepsis in the setting of acute cholecystitis: -On admission: Patient has fever of 104.2, tachycardic, tachypneic, lactic acid of 2.8 with leukocytosis.   -Reviewed CT abdomen, pelvis, HIDA scan and MRCP  -General surgery consulted who recommended colostomy tube placement given her multiple comorbidities. -S/p cholecystotomy tube placement by IR on 08/27/2022. -Continue cefepime and Flagyl.  Leukocytosis trending down.  Lactic acid: WNL -Continue as needed pain medications.  Start clear liquid diet today -Discussed with Dr. Arnoldo Morale via secure chat-patient can go home with the drain whenever medically stable, follow-up with Dr. Arnoldo Morale in 2 weeks (call office to schedule appointment), IR to  follow drain.  Okay to resume Eliquis.  Acute metabolic encephalopathy: -Likely in the setting of sepsis due to acute cholecystitis -Will continue to monitor. -Will consult PT/SLP  -Continue supportive care  Acute on chronic normocytic anemia: -H&H dropped this morning.  Will continue to monitor and transfuse as needed.  Thrombocytopenia: Will continue to monitor  Dementia with behavioral disturbance (HCC) -Continue Namenda and Aricept   Atrial fibrillation, chronic (HCC) -Resume Eliquis, continue metoprolol -Monitored on telemetry   Diastolic dysfunction with heart failure (HCC) -Continue atorvastatin, Imdur, metoprolol - Does not clinically appear to be in exacerbation at this time; follow up preoperative TTE   Hypokalemia -Replenished   GERD (gastroesophageal reflux disease) - Continue Protonix   High cholesterol -Continue statin   Bipolar disorder ([place]) - Continue Latuda, trazodone, Wellbutrin  DVT prophylaxis: SCD, Eliquis Code Status: Full code Family Communication:  None present at bedside.  Plan of care discussed with patient in length and she verbalized understanding and agreed with it. Disposition Plan: To be determined  Consultants:  General surgery IR  Procedures:  See above  Antimicrobials:  Cefepime Flagyl  Status is: Inpatient      Subjective: Patient seen and examined.  Continues to have right upper quadrant pain.  Tmax: 100.3.  No acute events overnight.  Objective: Vitals:   08/27/22 2018 08/27/22 2353 08/28/22 0543 08/28/22 0749  BP: 130/65 (!) 112/53 100/87 127/66  Pulse: 78 78 79 78  Resp: '20  20 17  '$ Temp: 100.3 F (37.9 C) 97.6 F (36.4 C) 98.7 F (37.1 C) 98.6 F (37 C)  TempSrc: Oral Oral  Oral  SpO2: 100% 95% 92% 97%  Weight:      Height:        Intake/Output Summary (Last 24 hours) at 08/28/2022 1159 Last data filed at 08/28/2022 0600 Gross per 24 hour  Intake 5245.75 ml  Output 555 ml  Net 4690.75 ml     Filed Weights   08/25/22 2004 08/26/22 1903  Weight: 81.6 kg 78.9 kg    Examination:  General exam: Appears calm and comfortable, on nasal cannula respiratory system: Clear to auscultation. Respiratory effort normal. Cardiovascular system: S1 & S2 heard, RRR. No JVD, murmurs, rubs, gallops or clicks. No pedal edema. Gastrointestinal system: Abdomen is obese, nondistended, soft, right upper quadrant abdominal tenderness positive, no guarding, no rigidity. Central nervous system: Alert and oriented.  Extremities: Symmetric 5 x 5 power. Skin: No rashes, lesions or ulcers    Data Reviewed: I have personally reviewed following labs and imaging studies  CBC: Recent Labs  Lab 08/25/22 2018 08/26/22 0155 08/27/22 0217 08/28/22 0238  WBC 22.9* 20.8* 18.4* 13.6*  NEUTROABS 20.3* 18.4* 16.3* 11.3*  HGB 13.5 11.7* 11.7* 9.7*  HCT 40.1 35.6* 35.3* 29.7*  MCV 88.5 91.5 89.6 92.5  PLT 256 188 174 142*    Basic Metabolic Panel: Recent Labs  Lab 08/25/22 2018 08/26/22 0155 08/26/22 0903 08/27/22 0217 08/28/22 0238  NA 135 138 139 140 141  K 3.4* 3.5 3.8 3.5 3.5  CL 100 104 109 112* 115*  CO2 '25 26 25 22 '$ 21*  GLUCOSE 131* 119* 113* 118* 99  BUN '14 15 16 12 17  '$ CREATININE 0.91 1.00 1.04* 0.99 1.00  CALCIUM 8.9 8.1* 7.7* 8.0* 8.1*  MG  --  1.4*  --  2.3  --     GFR: Estimated Creatinine Clearance: 56.6 mL/min (by C-G formula based on SCr of 1 mg/dL). Liver Function Tests: Recent Labs  Lab 08/25/22 2018 08/26/22 0155 08/27/22 0217 08/28/22 0238  AST '26 24 23 20  '$ ALT '19 15 15 14  '$ ALKPHOS 98 71 61 54  BILITOT 0.5 0.4 0.7 0.9  PROT 7.7 6.0* 6.0* 5.5*  ALBUMIN 3.8 2.9* 2.5* 2.1*    Recent Labs  Lab 08/25/22 2200  LIPASE 32    No results for input(s): "AMMONIA" in the last 168 hours. Coagulation Profile: Recent Labs  Lab 08/25/22 2018  INR 1.1    Cardiac Enzymes: No results for input(s): "CKTOTAL", "CKMB", "CKMBINDEX", "TROPONINI" in the last 168  hours. BNP (last 3 results) No results for input(s): "PROBNP" in the last 8760 hours. HbA1C: No results for input(s): "HGBA1C" in the last 72 hours. CBG: No results for input(s): "GLUCAP" in the last 168 hours. Lipid Profile: No results for input(s): "CHOL", "HDL", "LDLCALC", "TRIG", "CHOLHDL", "LDLDIRECT" in the last 72 hours. Thyroid Function Tests: No results for input(s): "TSH", "T4TOTAL", "FREET4", "T3FREE", "THYROIDAB" in the last 72 hours. Anemia Panel: No results for input(s): "VITAMINB12", "FOLATE", "FERRITIN", "TIBC", "IRON", "RETICCTPCT" in the last 72 hours. Sepsis Labs: Recent Labs  Lab 08/25/22 2018 08/25/22 2200 08/26/22 0155 08/26/22 0807  LATICACIDVEN 2.8* 2.5* 2.3* 1.5     Recent Results (from the past 240 hour(s))  Blood Culture (routine x 2)     Status: None (Preliminary result)   Collection Time: 08/25/22  8:12 PM   Specimen: BLOOD  Result Value Ref Range Status   Specimen Description BLOOD BLOOD RIGHT Krikorian  Final   Special Requests   Final    BOTTLES DRAWN AEROBIC AND ANAEROBIC Blood Culture adequate  volume   Culture   Final    NO GROWTH 3 DAYS Performed at [facility], [address] [place], [place] [address]    Report Status PENDING  Incomplete  Blood Culture (routine x 2)     Status: None (Preliminary result)   Collection Time: 08/25/22  8:18 PM   Specimen: BLOOD  Result Value Ref Range Status   Specimen Description BLOOD BLOOD LEFT ARM  Final   Special Requests   Final    BOTTLES DRAWN AEROBIC AND ANAEROBIC Blood Culture adequate volume   Culture   Final    NO GROWTH 3 DAYS Performed at [facility], [address] [place], [place] [address]    Report Status PENDING  Incomplete  Resp panel by RT-PCR (RSV, Flu A&B, Covid) Anterior Nasal Swab     Status: None   Collection Time: 08/25/22  8:33 PM   Specimen: Anterior Nasal Swab  Result Value Ref Range Status   SARS Coronavirus 2 by RT PCR NEGATIVE NEGATIVE Final    Comment:  (NOTE) SARS-CoV-2 target nucleic acids are NOT DETECTED.  The SARS-CoV-2 RNA is generally detectable in upper respiratory specimens during the acute phase of infection. The lowest concentration of SARS-CoV-2 viral copies this assay can detect is 138 copies/mL. A negative result does not preclude SARS-Cov-2 infection and should not be used as the sole basis for treatment or other patient management decisions. A negative result may occur with  improper specimen collection/handling, submission of specimen other than nasopharyngeal swab, presence of viral mutation(s) within the areas targeted by this assay, and inadequate number of viral copies(<138 copies/mL). A negative result must be combined with clinical observations, patient history, and epidemiological information. The expected result is Negative.  Fact Sheet for Patients:  EntrepreneurPulse.com.au  Fact Sheet for Healthcare Providers:  IncredibleEmployment.be  This test is no t yet approved or cleared by the [place] FDA and  has been authorized for detection and/or diagnosis of SARS-CoV-2 by FDA under an Emergency Use Authorization (EUA). This EUA will remain  in effect (meaning this test can be used) for the duration of the COVID-19 declaration under Section 564(b)(1) of the Act, 21 U.S.C.section 360bbb-3(b)(1), unless the authorization is terminated  or revoked sooner.       Influenza A by PCR NEGATIVE NEGATIVE Final   Influenza B by PCR NEGATIVE NEGATIVE Final    Comment: (NOTE) The Xpert Xpress SARS-CoV-2/FLU/RSV plus assay is intended as an aid in the diagnosis of influenza from Nasopharyngeal swab specimens and should not be used as a sole basis for treatment. Nasal washings and aspirates are unacceptable for Xpert Xpress SARS-CoV-2/FLU/RSV testing.  Fact Sheet for Patients: EntrepreneurPulse.com.au  Fact Sheet for Healthcare  Providers: IncredibleEmployment.be  This test is not yet approved or cleared by the [place] FDA and has been authorized for detection and/or diagnosis of SARS-CoV-2 by FDA under an Emergency Use Authorization (EUA). This EUA will remain in effect (meaning this test can be used) for the duration of the COVID-19 declaration under Section 564(b)(1) of the Act, 21 U.S.C. section 360bbb-3(b)(1), unless the authorization is terminated or revoked.     Resp Syncytial Virus by PCR NEGATIVE NEGATIVE Final    Comment: (NOTE) Fact Sheet for Patients: EntrepreneurPulse.com.au  Fact Sheet for Healthcare Providers: IncredibleEmployment.be  This test is not yet approved or cleared by the [place] FDA and has been authorized for detection and/or diagnosis of SARS-CoV-2 by FDA under an Emergency Use Authorization (EUA). This EUA will remain  in effect (meaning this test can be used) for the duration of the COVID-19 declaration under Section 564(b)(1) of the Act, 21 U.S.C. section 360bbb-3(b)(1), unless the authorization is terminated or revoked.  Performed at [facility], [address], [place], [place] [address]   Urine Culture     Status: Abnormal   Collection Time: 08/25/22  9:24 PM   Specimen: Urine, Clean Catch  Result Value Ref Range Status   Specimen Description   Final    URINE, CLEAN CATCH Performed at [facility], [address], [place], [place] [address]    Special Requests   Final    NONE Performed at [facility], [address], [place], [place] [address]    Culture (A)  Final    <10,000 COLONIES/mL INSIGNIFICANT GROWTH Performed at [facility], [place] [address], [place], [place] [address]    Report Status 08/27/2022 FINAL  Final      Radiology Studies: IR Perc Cholecystostomy  Result Date: 08/27/2022 INDICATION: Acute cholecystitis EXAM: Placement of percutaneous cholecystostomy tube using  ultrasound and fluoroscopic guidance MEDICATIONS: Documented in the EMR ANESTHESIA/SEDATION: Moderate (conscious) sedation was employed during this procedure. A total of Versed 1.5 mg and Fentanyl 75 mcg was administered intravenously by the radiology nurse. Total intra-service moderate Sedation Time: 15 minutes. The patient's level of consciousness and vital signs were monitored continuously by radiology nursing throughout the procedure under my direct supervision. FLUOROSCOPY: Radiation Exposure Index (as provided by the fluoroscopic device): 0.6 minutes (18 mGy) COMPLICATIONS: None immediate. PROCEDURE: Informed written consent was obtained from the patient after a thorough discussion of the procedural risks, benefits and alternatives. All questions were addressed. Maximal Sterile Barrier Technique was utilized including caps, mask, sterile gowns, sterile gloves, sterile drape, Manfre hygiene and skin antiseptic. A timeout was performed prior to the initiation of the procedure. The patient was placed supine on the exam table. The right upper quadrant was prepped and draped in the standard sterile fashion. Ultrasound of the right upper quadrant was performed for planning purposes. This again demonstrated a distended gallbladder with wall edema and sludge, consistent with acute cholecystitis. An intercostal transhepatic approach was planned. Skin entry site was marked, and local analgesia was obtained with 1% lidocaine. Under ultrasound guidance, percutaneous access was obtained into the gallbladder via an intercostal transhepatic approach using a 21 gauge Chiba needle. Access was confirmed with visualization of needle tip within the gallbladder lumen, and free return of bile. An 018 Nitrex wire was then advanced through the access needle and coiled within the gallbladder lumen. A transition dilator was advanced over this wire, through which an antegrade cholecystogram was performed. Antegrade cholecystogram  demonstrated appropriate location in the gallbladder lumen and sludge within a dilated gallbladder. Over an Amplatz wire, the percutaneous tract was serially dilated followed by placement of a 10 French locking multipurpose drainage catheter into the gallbladder lumen. Locking loop was formed. Additional biliary sludge was drained. Gentle Michelin injection of contrast material confirmed location of the gallbladder lumen. The drainage catheter was secured to the skin using silk suture and a dressing. It was placed to bag drainage. The patient tolerated the procedure well without immediate complication. IMPRESSION: Successful placement of a 10 French percutaneous cholecystostomy tube. A large amount of purulent bile was aspirated, and sent for microbiology. Cholecystostomy tube placed to bulb suction to assist with decompression of infected gallbladder in the acute phase. The patient may be switched to a bag drain in several days. Electronically Signed  By: Albin Felling M.D.   On: 08/27/2022 14:49    Scheduled Meds:  atorvastatin  40 mg Oral QHS   benztropine  0.5 mg Oral BID   buPROPion  150 mg Oral q morning   donepezil  10 mg Oral Q2000   fluticasone  1 spray Each Nare Daily   fluticasone furoate-vilanterol  1 puff Inhalation Daily   gabapentin  300 mg Oral TID   isosorbide mononitrate  15 mg Oral Daily   lurasidone  80 mg Oral QHS   memantine  10 mg Oral BID   metoprolol succinate  12.5 mg Oral Daily   pantoprazole  40 mg Oral BID AC   sodium chloride flush  5 mL Intracatheter Q8H   tiZANidine  2 mg Oral BID   traZODone  100 mg Oral QHS   venlafaxine XR  300 mg Oral Daily   Continuous Infusions:  sodium chloride 125 mL/hr at 08/28/22 0455   ceFEPime (MAXIPIME) IV 2 g (08/28/22 0500)   metronidazole 500 mg (08/27/22 2309)     LOS: 3 days   Time spent: 35 minutes   Aynslee Mulhall Loann Quill, MD [facility]  If 7PM-7AM, please contact night-coverage [email or web address] 08/28/2022, 11:59 AM

## 2022-08-28 NOTE — Evaluation (Signed)
Physical Therapy Evaluation Patient Details Name: Kathleen Valenzuela MRN: [id number] DOB: 05-08-1951 Today's Date: 08/28/2022  History of Present Illness  [name] admitted with AMS and found to have acute cholycystitis. PMH significant for but not limited to:  anemia, anxiety, bipolar 1 disorder, COPD, GERD, heart murmur, hyperlipidemia, hypertension  Clinical Impression  Pt admitted with above diagnosis.  Pt currently with functional limitations due to the deficits listed below (see PT Problem List). Pt will benefit from skilled PT to increase their independence and safety with mobility to allow discharge to the venue listed below.  Prior level of function unknown and previous living situation unknow except that it was at some type of facility.  Likely has had some decline in function due to her acute illness.          Recommendations for follow up therapy are one component of a multi-disciplinary discharge planning process, led by the attending physician.  Recommendations may be updated based on patient status, additional functional criteria and insurance authorization.  Follow Up Recommendations Skilled nursing-short term rehab (<3 hours/day) Can patient physically be transported by private vehicle: No    Assistance Recommended at Discharge Frequent or constant Supervision/Assistance  Patient can return home with the following  A lot of help with walking and/or transfers;A lot of help with bathing/dressing/bathroom    Equipment Recommendations Rolling walker (2 wheels)  Recommendations for Other Services       Functional Status Assessment Patient has had a recent decline in their functional status and demonstrates the ability to make significant improvements in function in a reasonable and predictable amount of time. (This is assumed, but no confirmation of what premorbid mobility level was)     Precautions / Restrictions Precautions Precautions: Fall Restrictions Weight Bearing  Restrictions: No      Mobility  Bed Mobility Overal bed mobility: Needs Assistance Bed Mobility: Supine to Sit     Supine to sit: Min assist          Transfers Overall transfer level: Needs assistance Equipment used: Rolling walker (2 wheels) Transfers: Sit to/from Stand Sit to Stand: Min assist                Ambulation/Gait Ambulation/Gait assistance: Mod assist Gait Distance (Feet): 2 Feet Assistive device: Rolling walker (2 wheels) Gait Pattern/deviations:  (unable to assess with 2 steps to chair)       General Gait Details: pt with difficulty figuring out sequencing and how to turn to get into the chair  Stairs            Wheelchair Mobility    Modified Rankin (Stroke Patients Only)       Balance Overall balance assessment: Needs assistance Sitting-balance support: Single extremity supported Sitting balance-Leahy Scale: Fair     Standing balance support: Bilateral upper extremity supported Standing balance-Leahy Scale: Poor                               Pertinent Vitals/Pain Pain Assessment Pain Assessment: PAINAD Breathing: occasional labored breathing, short period of hyperventilation Negative Vocalization: occasional moan/groan, low speech, negative/disapproving quality Facial Expression: smiling or inexpressive Body Language: tense, distressed pacing, fidgeting Consolability: distracted or reassured by voice/touch PAINAD Score: 4 Pain Intervention(s): Limited activity within patient's tolerance, Monitored during session    Home Living Family/patient expects to be discharged to:: Skilled nursing facility  Additional Comments: per chart says SNF, but I could not locate which SNF to confirm    Prior Function Prior Level of Function : Patient poor historian/Family not available                     Holck Dominance        Extremity/Trunk Assessment   Upper Extremity  Assessment Upper Extremity Assessment: Defer to OT evaluation    Lower Extremity Assessment Lower Extremity Assessment: Generalized weakness (pt also with very jumpy legs when sitting EOB and once in recliner)       Communication   Communication: Expressive difficulties (pt mumbles a lot of her words and can be difficult to understand at times)  Cognition Arousal/Alertness: Awake/alert Behavior During Therapy: WFL for tasks assessed/performed Overall Cognitive Status: No family/caregiver present to determine baseline cognitive functioning (has dementia at baseline)                                          General Comments General comments (skin integrity, edema, etc.): no family present    Exercises     Assessment/Plan    PT Assessment Patient needs continued PT services  PT Problem List Decreased strength;Decreased activity tolerance;Decreased balance;Decreased mobility;Decreased knowledge of use of DME;Decreased safety awareness       PT Treatment Interventions DME instruction;Gait training;Therapeutic activities;Therapeutic exercise;Balance training;Patient/family education    PT Goals (Current goals can be found in the Care Plan section)  Acute Rehab PT Goals PT Goal Formulation: Patient unable to participate in goal setting Time For Goal Achievement: 09/11/22 Potential to Achieve Goals: Fair    Frequency Min 2X/week     Co-evaluation               AM-PAC PT "6 Clicks" Mobility  Outcome Measure Help needed turning from your back to your side while in a flat bed without using bedrails?: A Little Help needed moving from lying on your back to sitting on the side of a flat bed without using bedrails?: A Little Help needed moving to and from a bed to a chair (including a wheelchair)?: A Lot Help needed standing up from a chair using your arms (e.g., wheelchair or bedside chair)?: A Lot Help needed to walk in hospital room?: A Lot Help needed  climbing 3-5 steps with a railing? : Total 6 Click Score: 13    End of Session Equipment Utilized During Treatment: Gait belt;Oxygen Activity Tolerance: Patient limited by fatigue Patient left: in chair;with call bell/phone within reach;with chair alarm set Nurse Communication: Mobility status PT Visit Diagnosis: Unsteadiness on feet (R26.81)    Time: 3338-3291 PT Time Calculation (min) (ACUTE ONLY): 27 min   Charges:   PT Evaluation $PT Eval Moderate Complexity: 1 Mod PT Treatments $Gait Training: 8-22 mins        Lavonia Dana, [place]  Office [phone] 08/28/2022   Melvern Banker 08/28/2022, 2:41 PM

## 2022-08-29 DIAGNOSIS — K810 Acute cholecystitis: Secondary | ICD-10-CM | POA: Diagnosis not present

## 2022-08-29 LAB — COMPREHENSIVE METABOLIC PANEL
ALT: 13 U/L (ref 0–44)
AST: 17 U/L (ref 15–41)
Albumin: 2.1 g/dL — ABNORMAL LOW (ref 3.5–5.0)
Alkaline Phosphatase: 56 U/L (ref 38–126)
Anion gap: 8 (ref 5–15)
BUN: 14 mg/dL (ref 8–23)
CO2: 19 mmol/L — ABNORMAL LOW (ref 22–32)
Calcium: 8.2 mg/dL — ABNORMAL LOW (ref 8.9–10.3)
Chloride: 116 mmol/L — ABNORMAL HIGH (ref 98–111)
Creatinine, Ser: 0.85 mg/dL (ref 0.44–1.00)
GFR, Estimated: >60 mL/min (ref >60)
Glucose, Bld: 129 mg/dL — ABNORMAL HIGH (ref 70–99)
Potassium: 3.3 mmol/L — ABNORMAL LOW (ref 3.5–5.1)
Sodium: 143 mmol/L (ref 135–145)
Total Bilirubin: 0.5 mg/dL (ref 0.3–1.2)
Total Protein: 5.5 g/dL — ABNORMAL LOW (ref 6.5–8.1)

## 2022-08-29 LAB — CBC WITH DIFFERENTIAL/PLATELET
Abs Immature Granulocytes: 0.09 10*3/uL — ABNORMAL HIGH (ref 0.00–0.07)
Basophils Absolute: 0.1 10*3/uL (ref 0.0–0.1)
Basophils Relative: 0 %
Eosinophils Absolute: 0.3 10*3/uL (ref 0.0–0.5)
Eosinophils Relative: 2 %
HCT: 30.8 % — ABNORMAL LOW (ref 36.0–46.0)
Hemoglobin: 9.7 g/dL — ABNORMAL LOW (ref 12.0–15.0)
Immature Granulocytes: 1 %
Lymphocytes Relative: 10 %
Lymphs Abs: 1.4 10*3/uL (ref 0.7–4.0)
MCH: 29.8 pg (ref 26.0–34.0)
MCHC: 31.5 g/dL (ref 30.0–36.0)
MCV: 94.8 fL (ref 80.0–100.0)
Monocytes Absolute: 1.1 10*3/uL — ABNORMAL HIGH (ref 0.1–1.0)
Monocytes Relative: 8 %
Neutro Abs: 10.4 10*3/uL — ABNORMAL HIGH (ref 1.7–7.7)
Neutrophils Relative %: 79 %
Platelets: 168 10*3/uL (ref 150–400)
RBC: 3.25 MIL/uL — ABNORMAL LOW (ref 3.87–5.11)
RDW: 15.4 % (ref 11.5–15.5)
WBC: 13.2 10*3/uL — ABNORMAL HIGH (ref 4.0–10.5)
nRBC: 0 % (ref 0.0–0.2)

## 2022-08-29 LAB — MAGNESIUM: Magnesium: 2.1 mg/dL (ref 1.7–2.4)

## 2022-08-29 MED ORDER — SODIUM CHLORIDE 0.9 % IV SOLN
2.0000 g | Freq: Three times a day (TID) | INTRAVENOUS | Status: DC
  Administered 2022-08-29 – 2022-09-07 (×28): 2 g via INTRAVENOUS
  Filled 2022-08-29 (×28): qty 12.5

## 2022-08-29 MED ORDER — POTASSIUM CHLORIDE IN NACL 20-0.9 MEQ/L-% IV SOLN
INTRAVENOUS | Status: DC
  Filled 2022-08-29 (×3): qty 1000

## 2022-08-29 NOTE — Plan of Care (Signed)
  Problem: Clinical Measurements: Goal: Will remain free from infection Outcome: Progressing   Problem: Activity: Goal: Risk for activity intolerance will decrease Outcome: Progressing   Problem: Nutrition: Goal: Adequate nutrition will be maintained Outcome: Progressing   Problem: Coping: Goal: Level of anxiety will decrease Outcome: Progressing   Problem: Pain Managment: Goal: General experience of comfort will improve Outcome: Progressing   

## 2022-08-29 NOTE — Progress Notes (Signed)
PROGRESS NOTE    Kathleen Valenzuela  JKK:[id number] DOB: 09/12/1950 DOA: 08/25/2022 PCP: Monico Blitz, MD   Brief Narrative:  71 y.o. female with medical history significant of anemia, anxiety, bipolar 1 disorder, COPD, GERD, heart murmur, hyperlipidemia, hypertension, and more presents ED with a chief complaint of altered mental status.  Unfortunately patient is not able to provide history due to her dementia.  Her son was here earlier and reported to staff that he had spoke to her and she was at her normal baseline.  Later her SNF called and reported that she was altered and coming to the ER.  He reports that she has had some episodes of emesis.  No further history could be obtained.  Son reports this is not patient's baseline.  In the ED they treated her for sepsis, identified the source is acute cholecystitis and used cefepime and Flagyl for antibiotics as patient has a penicillin allergy.  She received fluids.  With these treatments patient did start to improve.  Pt discovered after work up to have acute acalculous cholecystitis.  Dr. Arnoldo Morale evaluated patient and determined next best step is to have cholecystostomy tube placed.  IR requested patient transfer to [facility] to have tube placement,  Assessment & Plan:   Sepsis in the setting of acute cholecystitis: -On admission: Patient has fever of 104.2, tachycardic, tachypneic, lactic acid of 2.8 with leukocytosis.  Sepsis resolved. -Reviewed CT abdomen, pelvis, HIDA scan and MRCP  -General surgery consulted who recommended colostomy tube placement given her multiple comorbidities.  Patient transferred from [facility] to [facility] -S/p cholecystotomy tube placement by IR on 08/27/2022. -Continue cefepime and Flagyl.  Leukocytosis trending down.  Lactic acid: WNL -Continue as needed pain medications.   -Discussed with Dr. Arnoldo Morale via secure chat-patient can go home with the drain whenever medically stable, follow-up with Dr. Arnoldo Morale in 2 weeks (call office to schedule  appointment), IR to follow drain.  Okay to resume Eliquis.  Acute metabolic encephalopathy: -Likely in the setting of sepsis due to acute cholecystitis -Will continue to monitor -Continue supportive care -PT recommended SNF.  TOC consulted  Acute on chronic normocytic anemia: -H&H dropped from 11-9.  Will continue to monitor and transfuse as needed.  Thrombocytopenia: Improved  Dementia with behavioral disturbance (HCC) -Continue Namenda and Aricept   Atrial fibrillation, chronic (HCC) -Continue Eliquis, continue metoprolol -Monitored on telemetry   Diastolic dysfunction with heart failure (HCC) -Continue atorvastatin, Imdur, metoprolol - Does not clinically appear to be in exacerbation at this time; follow up preoperative TTE   Hypokalemia -Replenished.  Check magnesium level   GERD (gastroesophageal reflux disease) - Continue Protonix   High cholesterol -Continue statin   Bipolar disorder ([place]) - Continue Latuda, trazodone, Wellbutrin  Concern for aspiration: Consulted SLP  DVT prophylaxis: SCD, Eliquis Code Status: Full code Family Communication:  None present at bedside.  Plan of care discussed with patient in length and she verbalized understanding and agreed with it.  Called patient's son and discussed plan of care and he verbalized understanding.  Disposition Plan: SNF  Consultants:  General surgery IR  Procedures:  See above  Antimicrobials:  Cefepime Flagyl  Status is: Inpatient      Subjective: Patient seen and examined.  Sleepy but arousable.  Reports right upper quadrant pain.  Remained afebrile overnight.  No acute event overnight. Objective: Vitals:   08/28/22 2056 08/29/22 0537 08/29/22 0725 08/29/22 0959  BP: 133/84 135/86 (!) 116/49 126/64  Pulse: 87 86 93   Resp:  $'18 20 16   'q$ Temp: 97.6 F (36.4 C) 98.7 F (37.1 C) 97.7 F (36.5 C)   TempSrc: Oral Oral Oral   SpO2: 98% 96% 97%   Weight:      Height:        Intake/Output  Summary (Last 24 hours) at 08/29/2022 1032 Last data filed at 08/29/2022 9326 Gross per 24 hour  Intake 430 ml  Output 805 ml  Net -375 ml    Filed Weights   08/25/22 2004 08/26/22 1903  Weight: 81.6 kg 78.9 kg    Examination:  General exam: Elderly female, appears calm and comfortable, on nasal cannula respiratory system: Clear to auscultation. Respiratory effort normal. Cardiovascular system: S1 & S2 heard, RRR. No JVD, murmurs, rubs, gallops or clicks. No pedal edema. Gastrointestinal system: Abdomen is obese, nondistended, soft, right upper quadrant abdominal tenderness positive, no guarding, no rigidity. Central nervous system: Alert and oriented.  Following commands. Extremities: Symmetric 5 x 5 power. Skin: No rashes, lesions or ulcers    Data Reviewed: I have personally reviewed following labs and imaging studies  CBC: Recent Labs  Lab 08/25/22 2018 08/26/22 0155 08/27/22 0217 08/28/22 0238 08/29/22 0230  WBC 22.9* 20.8* 18.4* 13.6* 13.2*  NEUTROABS 20.3* 18.4* 16.3* 11.3* 10.4*  HGB 13.5 11.7* 11.7* 9.7* 9.7*  HCT 40.1 35.6* 35.3* 29.7* 30.8*  MCV 88.5 91.5 89.6 92.5 94.8  PLT 256 188 174 142* 712    Basic Metabolic Panel: Recent Labs  Lab 08/26/22 0155 08/26/22 0903 08/27/22 0217 08/28/22 0238 08/29/22 0230  NA 138 139 140 141 143  K 3.5 3.8 3.5 3.5 3.3*  CL 104 109 112* 115* 116*  CO2 '26 25 22 '$ 21* 19*  GLUCOSE 119* 113* 118* 99 129*  BUN '15 16 12 17 14  '$ CREATININE 1.00 1.04* 0.99 1.00 0.85  CALCIUM 8.1* 7.7* 8.0* 8.1* 8.2*  MG 1.4*  --  2.3  --  2.1    GFR: Estimated Creatinine Clearance: 66.6 mL/min (by C-G formula based on SCr of 0.85 mg/dL). Liver Function Tests: Recent Labs  Lab 08/25/22 2018 08/26/22 0155 08/27/22 0217 08/28/22 0238 08/29/22 0230  AST '26 24 23 20 17  '$ ALT '19 15 15 14 13  '$ ALKPHOS 98 71 61 54 56  BILITOT 0.5 0.4 0.7 0.9 0.5  PROT 7.7 6.0* 6.0* 5.5* 5.5*  ALBUMIN 3.8 2.9* 2.5* 2.1* 2.1*    Recent Labs  Lab  08/25/22 2200  LIPASE 32    No results for input(s): "AMMONIA" in the last 168 hours. Coagulation Profile: Recent Labs  Lab 08/25/22 2018  INR 1.1    Cardiac Enzymes: No results for input(s): "CKTOTAL", "CKMB", "CKMBINDEX", "TROPONINI" in the last 168 hours. BNP (last 3 results) No results for input(s): "PROBNP" in the last 8760 hours. HbA1C: No results for input(s): "HGBA1C" in the last 72 hours. CBG: No results for input(s): "GLUCAP" in the last 168 hours. Lipid Profile: No results for input(s): "CHOL", "HDL", "LDLCALC", "TRIG", "CHOLHDL", "LDLDIRECT" in the last 72 hours. Thyroid Function Tests: No results for input(s): "TSH", "T4TOTAL", "FREET4", "T3FREE", "THYROIDAB" in the last 72 hours. Anemia Panel: No results for input(s): "VITAMINB12", "FOLATE", "FERRITIN", "TIBC", "IRON", "RETICCTPCT" in the last 72 hours. Sepsis Labs: Recent Labs  Lab 08/25/22 2018 08/25/22 2200 08/26/22 0155 08/26/22 0807  LATICACIDVEN 2.8* 2.5* 2.3* 1.5     Recent Results (from the past 240 hour(s))  Blood Culture (routine x 2)     Status: None (Preliminary result)   Collection Time:  08/25/22  8:12 PM   Specimen: BLOOD  Result Value Ref Range Status   Specimen Description BLOOD BLOOD RIGHT [place]  Final   Special Requests   Final    BOTTLES DRAWN AEROBIC AND ANAEROBIC Blood Culture adequate volume   Culture   Final    NO GROWTH 4 DAYS Performed at [facility], [address] [place], [place] [address]    Report Status PENDING  Incomplete  Blood Culture (routine x 2)     Status: None (Preliminary result)   Collection Time: 08/25/22  8:18 PM   Specimen: BLOOD  Result Value Ref Range Status   Specimen Description BLOOD BLOOD LEFT ARM  Final   Special Requests   Final    BOTTLES DRAWN AEROBIC AND ANAEROBIC Blood Culture adequate volume   Culture   Final    NO GROWTH 4 DAYS Performed at [facility], [address] [place], [place] [address]    Report Status PENDING  Incomplete   Resp panel by RT-PCR (RSV, Flu A&B, Covid) Anterior Nasal Swab     Status: None   Collection Time: 08/25/22  8:33 PM   Specimen: Anterior Nasal Swab  Result Value Ref Range Status   SARS Coronavirus 2 by RT PCR NEGATIVE NEGATIVE Final    Comment: (NOTE) SARS-CoV-2 target nucleic acids are NOT DETECTED.  The SARS-CoV-2 RNA is generally detectable in upper respiratory specimens during the acute phase of infection. The lowest concentration of SARS-CoV-2 viral copies this assay can detect is 138 copies/mL. A negative result does not preclude SARS-Cov-2 infection and should not be used as the sole basis for treatment or other patient management decisions. A negative result may occur with  improper specimen collection/handling, submission of specimen other than nasopharyngeal swab, presence of viral mutation(s) within the areas targeted by this assay, and inadequate number of viral copies(<138 copies/mL). A negative result must be combined with clinical observations, patient history, and epidemiological information. The expected result is Negative.  Fact Sheet for Patients:  EntrepreneurPulse.com.au  Fact Sheet for Healthcare Providers:  IncredibleEmployment.be  This test is no t yet approved or cleared by the [place] FDA and  has been authorized for detection and/or diagnosis of SARS-CoV-2 by FDA under an Emergency Use Authorization (EUA). This EUA will remain  in effect (meaning this test can be used) for the duration of the COVID-19 declaration under Section 564(b)(1) of the Act, 21 U.S.C.section 360bbb-3(b)(1), unless the authorization is terminated  or revoked sooner.       Influenza A by PCR NEGATIVE NEGATIVE Final   Influenza B by PCR NEGATIVE NEGATIVE Final    Comment: (NOTE) The Xpert Xpress SARS-CoV-2/FLU/RSV plus assay is intended as an aid in the diagnosis of influenza from Nasopharyngeal swab specimens and should not be used  as a sole basis for treatment. Nasal washings and aspirates are unacceptable for Xpert Xpress SARS-CoV-2/FLU/RSV testing.  Fact Sheet for Patients: EntrepreneurPulse.com.au  Fact Sheet for Healthcare Providers: IncredibleEmployment.be  This test is not yet approved or cleared by the [place] FDA and has been authorized for detection and/or diagnosis of SARS-CoV-2 by FDA under an Emergency Use Authorization (EUA). This EUA will remain in effect (meaning this test can be used) for the duration of the COVID-19 declaration under Section 564(b)(1) of the Act, 21 U.S.C. section 360bbb-3(b)(1), unless the authorization is terminated or revoked.     Resp Syncytial Virus by PCR NEGATIVE NEGATIVE Final    Comment: (NOTE) Fact Sheet for Patients: EntrepreneurPulse.com.au  Fact Sheet for Healthcare Providers: IncredibleEmployment.be  This test is not yet approved or cleared by the [place] FDA and has been authorized for detection and/or diagnosis of SARS-CoV-2 by FDA under an Emergency Use Authorization (EUA). This EUA will remain in effect (meaning this test can be used) for the duration of the COVID-19 declaration under Section 564(b)(1) of the Act, 21 U.S.C. section 360bbb-3(b)(1), unless the authorization is terminated or revoked.  Performed at [facility], [address] [place], [place] [address]   Urine Culture     Status: Abnormal   Collection Time: 08/25/22  9:24 PM   Specimen: Urine, Clean Catch  Result Value Ref Range Status   Specimen Description   Final    URINE, CLEAN CATCH Performed at [facility], [address] [place], [place] [address]    Special Requests   Final    NONE Performed at [facility], [address], [place], [place] [address]    Culture (A)  Final    <10,000 COLONIES/mL INSIGNIFICANT GROWTH Performed at [facility], [place] [address], [place], [place]  [address]    Report Status 08/27/2022 FINAL  Final      Radiology Studies: IR Perc Cholecystostomy  Result Date: 08/27/2022 INDICATION: Acute cholecystitis EXAM: Placement of percutaneous cholecystostomy tube using ultrasound and fluoroscopic guidance MEDICATIONS: Documented in the EMR ANESTHESIA/SEDATION: Moderate (conscious) sedation was employed during this procedure. A total of Versed 1.5 mg and Fentanyl 75 mcg was administered intravenously by the radiology nurse. Total intra-service moderate Sedation Time: 15 minutes. The patient's level of consciousness and vital signs were monitored continuously by radiology nursing throughout the procedure under my direct supervision. FLUOROSCOPY: Radiation Exposure Index (as provided by the fluoroscopic device): 0.6 minutes (18 mGy) COMPLICATIONS: None immediate. PROCEDURE: Informed written consent was obtained from the patient after a thorough discussion of the procedural risks, benefits and alternatives. All questions were addressed. Maximal Sterile Barrier Technique was utilized including caps, mask, sterile gowns, sterile gloves, sterile drape, Reicks hygiene and skin antiseptic. A timeout was performed prior to the initiation of the procedure. The patient was placed supine on the exam table. The right upper quadrant was prepped and draped in the standard sterile fashion. Ultrasound of the right upper quadrant was performed for planning purposes. This again demonstrated a distended gallbladder with wall edema and sludge, consistent with acute cholecystitis. An intercostal transhepatic approach was planned. Skin entry site was marked, and local analgesia was obtained with 1% lidocaine. Under ultrasound guidance, percutaneous access was obtained into the gallbladder via an intercostal transhepatic approach using a 21 gauge Chiba needle. Access was confirmed with visualization of needle tip within the gallbladder lumen, and free return of bile. An 018 Nitrex wire was  then advanced through the access needle and coiled within the gallbladder lumen. A transition dilator was advanced over this wire, through which an antegrade cholecystogram was performed. Antegrade cholecystogram demonstrated appropriate location in the gallbladder lumen and sludge within a dilated gallbladder. Over an Amplatz wire, the percutaneous tract was serially dilated followed by placement of a 10 French locking multipurpose drainage catheter into the gallbladder lumen. Locking loop was formed. Additional biliary sludge was drained. Gentle Charette injection of contrast material confirmed location of the gallbladder lumen. The drainage catheter was secured to the skin using silk suture and a dressing. It was placed to bag drainage. The patient tolerated the procedure well without immediate complication. IMPRESSION: Successful placement of a 10 French percutaneous cholecystostomy tube. A large amount  of purulent bile was aspirated, and sent for microbiology. Cholecystostomy tube placed to bulb suction to assist with decompression of infected gallbladder in the acute phase. The patient may be switched to a bag drain in several days. Electronically Signed   By: Albin Felling M.D.   On: 08/27/2022 14:49    Scheduled Meds:  apixaban  5 mg Oral BID   atorvastatin  40 mg Oral QHS   benztropine  0.5 mg Oral BID   buPROPion  150 mg Oral q morning   donepezil  10 mg Oral Q2000   fluticasone  1 spray Each Nare Daily   fluticasone furoate-vilanterol  1 puff Inhalation Daily   gabapentin  300 mg Oral TID   isosorbide mononitrate  15 mg Oral Daily   lurasidone  80 mg Oral QHS   memantine  10 mg Oral BID   metoprolol succinate  12.5 mg Oral Daily   pantoprazole  40 mg Oral BID AC   sodium chloride flush  5 mL Intracatheter Q8H   tiZANidine  2 mg Oral BID   traZODone  100 mg Oral QHS   venlafaxine XR  300 mg Oral Daily   Continuous Infusions:  0.9 % NaCl with KCl 20 mEq / L 75 mL/hr at 08/29/22 1011    ceFEPime (MAXIPIME) IV     metronidazole 500 mg (08/29/22 0027)     LOS: 4 days   Time spent: 35 minutes   Jasmin Winberry Loann Quill, MD [facility]  If 7PM-7AM, please contact night-coverage [email or web address] 08/29/2022, 10:32 AM

## 2022-08-29 NOTE — Progress Notes (Signed)
PHARMACY NOTE:  ANTIMICROBIAL RENAL DOSAGE ADJUSTMENT  Current antimicrobial regimen includes a mismatch between antimicrobial dosage and estimated renal function.  As per policy approved by the Pharmacy & Therapeutics and Medical Executive Committees, the antimicrobial dosage will be adjusted accordingly.  Current antimicrobial dosage:  Cefepime 2g IV Q12H  Indication: Acute cholecystitis   Renal Function:  Estimated Creatinine Clearance: 66.6 mL/min (by C-G formula based on SCr of 0.85 mg/dL). '[]'$      On intermittent HD, scheduled: '[]'$      On CRRT    Antimicrobial dosage has been changed to:  Cefepime 2g IV Q8H   Additional comments:   Thank you for allowing pharmacy to be a part of this patient's care.  Adria Dill, PharmD PGY-2 Infectious Diseases Resident  08/29/2022 7:51 AM

## 2022-08-29 NOTE — Progress Notes (Signed)
I'm concern about patient  having difficulty swallowing her meds, need SLP,,and son want MD to call him for updates on mother about next step.On call J Daniels,NP informed,will continue to monitor

## 2022-08-30 ENCOUNTER — Inpatient Hospital Stay (HOSPITAL_COMMUNITY): Payer: Medicare Other

## 2022-08-30 DIAGNOSIS — K810 Acute cholecystitis: Secondary | ICD-10-CM | POA: Diagnosis not present

## 2022-08-30 LAB — BLOOD GAS, VENOUS
Acid-base deficit: 4 mmol/L — ABNORMAL HIGH (ref 0.0–2.0)
Bicarbonate: 19.8 mmol/L — ABNORMAL LOW (ref 20.0–28.0)
Drawn by: 1338
O2 Saturation: 98.8 %
Patient temperature: 37
pCO2, Ven: 32 mmHg — ABNORMAL LOW (ref 44–60)
pH, Ven: 7.4 (ref 7.25–7.43)
pO2, Ven: 89 mmHg — ABNORMAL HIGH (ref 32–45)

## 2022-08-30 LAB — CULTURE, BLOOD (ROUTINE X 2)
Culture: NO GROWTH
Culture: NO GROWTH
Special Requests: ADEQUATE
Special Requests: ADEQUATE

## 2022-08-30 LAB — COMPREHENSIVE METABOLIC PANEL
ALT: 11 U/L (ref 0–44)
AST: 15 U/L (ref 15–41)
Albumin: 2.3 g/dL — ABNORMAL LOW (ref 3.5–5.0)
Alkaline Phosphatase: 65 U/L (ref 38–126)
Anion gap: 7 (ref 5–15)
BUN: 10 mg/dL (ref 8–23)
CO2: 19 mmol/L — ABNORMAL LOW (ref 22–32)
Calcium: 8.3 mg/dL — ABNORMAL LOW (ref 8.9–10.3)
Chloride: 116 mmol/L — ABNORMAL HIGH (ref 98–111)
Creatinine, Ser: 0.76 mg/dL (ref 0.44–1.00)
GFR, Estimated: >60 mL/min (ref >60)
Glucose, Bld: 95 mg/dL (ref 70–99)
Potassium: 3.6 mmol/L (ref 3.5–5.1)
Sodium: 142 mmol/L (ref 135–145)
Total Bilirubin: 0.8 mg/dL (ref 0.3–1.2)
Total Protein: 6.1 g/dL — ABNORMAL LOW (ref 6.5–8.1)

## 2022-08-30 LAB — CBC WITH DIFFERENTIAL/PLATELET
Abs Immature Granulocytes: 0.14 10*3/uL — ABNORMAL HIGH (ref 0.00–0.07)
Basophils Absolute: 0.1 10*3/uL (ref 0.0–0.1)
Basophils Relative: 0 %
Eosinophils Absolute: 0.2 10*3/uL (ref 0.0–0.5)
Eosinophils Relative: 1 %
HCT: 33.6 % — ABNORMAL LOW (ref 36.0–46.0)
Hemoglobin: 11.1 g/dL — ABNORMAL LOW (ref 12.0–15.0)
Immature Granulocytes: 1 %
Lymphocytes Relative: 9 %
Lymphs Abs: 1.4 10*3/uL (ref 0.7–4.0)
MCH: 30.2 pg (ref 26.0–34.0)
MCHC: 33 g/dL (ref 30.0–36.0)
MCV: 91.3 fL (ref 80.0–100.0)
Monocytes Absolute: 1.7 10*3/uL — ABNORMAL HIGH (ref 0.1–1.0)
Monocytes Relative: 10 %
Neutro Abs: 12.9 10*3/uL — ABNORMAL HIGH (ref 1.7–7.7)
Neutrophils Relative %: 79 %
Platelets: 214 10*3/uL (ref 150–400)
RBC: 3.68 MIL/uL — ABNORMAL LOW (ref 3.87–5.11)
RDW: 15.1 % (ref 11.5–15.5)
WBC: 16.5 10*3/uL — ABNORMAL HIGH (ref 4.0–10.5)
nRBC: 0.1 % (ref 0.0–0.2)

## 2022-08-30 LAB — D-DIMER, QUANTITATIVE: D-Dimer, Quant: 5.96 ug/mL-FEU — ABNORMAL HIGH (ref 0.00–0.50)

## 2022-08-30 LAB — BRAIN NATRIURETIC PEPTIDE: B Natriuretic Peptide: 790.7 pg/mL — ABNORMAL HIGH (ref 0.0–100.0)

## 2022-08-30 LAB — MAGNESIUM: Magnesium: 1.9 mg/dL (ref 1.7–2.4)

## 2022-08-30 LAB — GLUCOSE, CAPILLARY
Glucose-Capillary: 107 mg/dL — ABNORMAL HIGH (ref 70–99)
Glucose-Capillary: 128 mg/dL — ABNORMAL HIGH (ref 70–99)

## 2022-08-30 LAB — MRSA NEXT GEN BY PCR, NASAL: MRSA by PCR Next Gen: NOT DETECTED

## 2022-08-30 LAB — TROPONIN I (HIGH SENSITIVITY): Troponin I (High Sensitivity): 43 ng/L — ABNORMAL HIGH (ref <18)

## 2022-08-30 MED ORDER — CHLORHEXIDINE GLUCONATE CLOTH 2 % EX PADS
6.0000 | MEDICATED_PAD | Freq: Every day | CUTANEOUS | Status: DC
  Administered 2022-08-30 – 2022-09-16 (×16): 6 via TOPICAL

## 2022-08-30 MED ORDER — FUROSEMIDE 10 MG/ML IJ SOLN
60.0000 mg | Freq: Once | INTRAMUSCULAR | Status: AC
  Administered 2022-08-30: 60 mg via INTRAVENOUS
  Filled 2022-08-30: qty 6

## 2022-08-30 MED ORDER — LORAZEPAM 2 MG/ML IJ SOLN
0.5000 mg | Freq: Once | INTRAMUSCULAR | Status: AC | PRN
  Administered 2022-08-30: 0.5 mg via INTRAVENOUS
  Filled 2022-08-30: qty 1

## 2022-08-30 MED ORDER — DEXMEDETOMIDINE HCL IN NACL 400 MCG/100ML IV SOLN
0.4000 ug/kg/h | INTRAVENOUS | Status: DC
  Administered 2022-08-30: 0.7 ug/kg/h via INTRAVENOUS
  Administered 2022-08-30: 0.4 ug/kg/h via INTRAVENOUS
  Administered 2022-08-30: 1 ug/kg/h via INTRAVENOUS
  Administered 2022-08-31: 0.8 ug/kg/h via INTRAVENOUS
  Filled 2022-08-30 (×3): qty 100

## 2022-08-30 MED ORDER — DEXMEDETOMIDINE HCL IN NACL 400 MCG/100ML IV SOLN
INTRAVENOUS | Status: AC
  Filled 2022-08-30: qty 100

## 2022-08-30 NOTE — Progress Notes (Signed)
[facility] PCCM Evening Rounds  Diuresing after 1245 dose of lasix On NIMV Required precedex for agitation On exam much more relaxed, work of breathing improved Will give another dose of lasix Keep NPO NIMV overnight If respiratory status worsens then will need intubation  Updated her son [name] by phone  Roselie Awkward, MD [place] PCCM Pager: [phone] Cell: [phone] After 7:00 pm call Elink  [phone]

## 2022-08-30 NOTE — Progress Notes (Signed)
SLP Cancellation Note  Patient Details Name: Kathleen Valenzuela MRN: [id number] DOB: 07/03/1951   Cancelled evaluation: pt is poorly attentive, talking to people who are not present. RR reached mid 38s upon my entering room; currently on 35L HFNC at 90%.  She is not able to participate in a clinical swallow evaluation at this time.    Will follow for readiness.  Danniella Robben L. Tivis Ringer, MA CCC/SLP Clinical Specialist - Acute Care SLP Acute Rehabilitation Services Office number [phone]     Juan Quam Laurice 08/30/2022, 11:05 AM

## 2022-08-30 NOTE — Progress Notes (Signed)
Pt transferred from 6N to 5W-22 on BiPap. Pt A&Ox1. RR:28 on 50% Bipap. Satting 94%. Pt febrile: 99.6. HR sinus tach. BP stable. R-lung sounds more diminished than L-Lung. Pt has percutaneous drain in RUQ, no output.  Notified Rathore, MD of findings. MD informed RN that she is attending to emergent situation and if orders are not placed to notify dayteam. Will monitor for new orders and report findings to day team.   Bed in lowest position and bed alarm armed. Call bell within reach.

## 2022-08-30 NOTE — Significant Event (Signed)
Rapid Response Event Note   Reason for Call :  Respiratory distress She has become intolerant of Bipap, and is currently on Heated High Flow  Initial Focused Assessment:  Patient is lying in bed.  She is alert and mumbling, but not coherent or interactive with staff.  She is very restless. She looks air hungry.  Using accessory muscles to breath.  Knees/legs are mottled. BP 166/99  HR 125  RR 40-44  O2 sat 95% on heated high flow Axillary temp 101.2  Lung sounds with scatter crackles, deceased right base Heart tone rapid  Abdomen tender to the touch. Drain to [employer], serosang fluid [employer], linen changed. [place] now to suction   Interventions:  Repositioned Labs and PCXR recently done  Dr Si Raider at bedside to reassess patient CCM at bedside to assess patient  Transfer to ICU  Plan of Care:     Event Summary:   MD Notified: [facility] Call Time: [place] Time:  5732 End Time: 1216  Raliegh Ip, RN

## 2022-08-30 NOTE — Progress Notes (Signed)
   08/30/22 1010  Assess: MEWS Score  Temp 98.2 F (36.8 C)  Resp (!) 26  Level of Consciousness Alert  Assess: MEWS Score  MEWS Temp 0  MEWS Systolic 0  MEWS Pulse 2  MEWS RR 2  MEWS LOC 0  MEWS Score 4  MEWS Score Color Red  Assess: if the MEWS score is Yellow or Red  Were vital signs taken at a resting state? Yes  Focused Assessment No change from prior assessment  Treat  MEWS Interventions Escalated (See documentation below)  Take Vital Signs  Increase Vital Sign Frequency  Red: Q 1hr X 4 then Q 4hr X 4, if remains red, continue Q 4hrs  Escalate  MEWS: Escalate Red: discuss with charge nurse/RN and provider, consider discussing with RRT  Notify: Charge Nurse/RN  Name of Charge Nurse/RN Notified john  Date Charge Nurse/RN Notified 08/30/22  Time Charge Nurse/RN Notified 1021  Provider Notification  Provider Name/Title wouk  Date Provider Notified 08/30/22  Time Provider Notified 1021  Document  Patient Outcome Not stable and remains on department  Assess: SIRS CRITERIA  SIRS Temperature  0  SIRS Pulse 1  SIRS Respirations  1  SIRS WBC 1  SIRS Score Sum  3

## 2022-08-30 NOTE — Progress Notes (Signed)
PROGRESS NOTE    Kathleen Valenzuela  YWV:[id number] DOB: 1950/12/16 DOA: 08/25/2022 PCP: Monico Blitz, MD   Brief Narrative:  71 y.o. female with medical history significant of anemia, anxiety, bipolar 1 disorder, COPD, GERD, heart murmur, hyperlipidemia, hypertension, and more presents ED with a chief complaint of altered mental status.  Unfortunately patient is not able to provide history due to her dementia.  Her son was here earlier and reported to staff that he had spoke to her and she was at her normal baseline.  Later her SNF called and reported that she was altered and coming to the ER.  He reports that she has had some episodes of emesis.  No further history could be obtained.  Son reports this is not patient's baseline.  In the ED they treated her for sepsis, identified the source is acute cholecystitis and used cefepime and Flagyl for antibiotics as patient has a penicillin allergy.  She received fluids.  With these treatments patient did start to improve.  Pt discovered after work up to have acute acalculous cholecystitis.  Dr. Arnoldo Morale evaluated patient and determined next best step is to have cholecystostomy tube placed.  IR requested patient transfer to [facility] to have tube placement,  Assessment & Plan:   Sepsis in the setting of acute cholecystitis: -On admission: Patient has fever of 104.2, tachycardic, tachypneic, lactic acid of 2.8 with leukocytosis.  Sepsis resolved. -Reviewed CT abdomen, pelvis, HIDA scan and MRCP  -General surgery consulted who recommended cholecystostomy tube placement given her multiple comorbidities.  Patient transferred from [facility] to [facility] -S/p cholecystotomy tube placement by IR on 08/27/2022. -Continue cefepime and Flagyl (abx started 12/21).  Leukocytosis trending down.  Lactic acid: WNL -Continue as needed pain medications.   -Discussed with Dr. Arnoldo Morale via secure chat-patient can go home with the drain whenever medically stable, follow-up with Dr. Arnoldo Morale in 2 weeks  (call office to schedule appointment), IR to follow drain.  Okay to resume Eliquis.  Acute hypoxic respiratory failure Overnight hypoxia, tachypnea, placed on bipap. Currently breathing comfortably on bipap. Question fluid overload, although not sure I/os are accurate is up almost 7 liters from admission - will check dimer, bnp, vbg, trop, and cxr - will d/c fluids and given lasix 40 IV once  Acute metabolic encephalopathy: -Likely in the setting of sepsis due to acute cholecystitis, underlying dementia - requiring soft restraints while on bipap -Will continue to monitor -Continue supportive care -PT recommended SNF.  TOC consulted  Acute on chronic normocytic anemia: -H&H dropped from 11-9.  Will continue to monitor and transfuse as needed.  Thrombocytopenia: Improved  Dementia with behavioral disturbance (HCC) -Continue Namenda and Aricept   Atrial fibrillation, chronic (HCC) -Continue Eliquis, continue metoprolol -Monitored on telemetry   Diastolic dysfunction with heart failure (HCC) -Continue atorvastatin, Imdur, metoprolol - Does not clinically appear to be in exacerbation at this time; follow up preoperative TTE   Hypokalemia -Replenished.  Check magnesium level   GERD (gastroesophageal reflux disease) - Continue Protonix   High cholesterol -Continue statin   Bipolar disorder ([place]) - Continue Latuda, trazodone, Wellbutrin  Concern for aspiration: Consulted SLP  DVT prophylaxis: SCD, Eliquis Code Status: Full code Family Communication:  son chris updated telephonically 12/26    Disposition Plan: SNF  Consultants:  General surgery IR  Procedures:  See above  Antimicrobials:  Cefepime Flagyl  Status is: Inpatient      Subjective: No complaints. On bipap  Objective: Vitals:   08/30/22 0547 08/30/22 8546 08/30/22  0645 08/30/22 0800  BP: (!) 153/124 131/80  134/76  Pulse: (!) 142 (!) 111  (!) 102  Resp:  (!) 28  (!) 24  Temp: 98.7 F  (37.1 C) 99.6 F (37.6 C)  98.4 F (36.9 C)  TempSrc: Oral Axillary  Axillary  SpO2: 94% 93% 97% 95%  Weight:      Height:        Intake/Output Summary (Last 24 hours) at 08/30/2022 0859 Last data filed at 08/30/2022 0640 Gross per 24 hour  Intake 807.18 ml  Output 640 ml  Net 167.18 ml   Filed Weights   08/25/22 2004 08/26/22 1903  Weight: 81.6 kg 78.9 kg    Examination:  General exam: Elderly female, on bipap Respiratory system: rales at bases Cardiovascular system: S1 & S2 heard, RRR. No JVD, murmurs, rubs, gallops or clicks.   Gastrointestinal system: Abdomen is obese, nondistended, soft, right upper quadrant abdominal tenderness positive, no guarding, no rigidity. Central nervous system: Awake, confused Extremities: Symmetric 5 x 5 power. Trace LE edema Skin: No rashes, lesions or ulcers    Data Reviewed: I have personally reviewed following labs and imaging studies  CBC: Recent Labs  Lab 08/26/22 0155 08/27/22 0217 08/28/22 0238 08/29/22 0230 08/30/22 0350  WBC 20.8* 18.4* 13.6* 13.2* 16.5*  NEUTROABS 18.4* 16.3* 11.3* 10.4* 12.9*  HGB 11.7* 11.7* 9.7* 9.7* 11.1*  HCT 35.6* 35.3* 29.7* 30.8* 33.6*  MCV 91.5 89.6 92.5 94.8 91.3  PLT 188 174 142* 168 035   Basic Metabolic Panel: Recent Labs  Lab 08/26/22 0155 08/26/22 0903 08/27/22 0217 08/28/22 0238 08/29/22 0230 08/30/22 0350  NA 138 139 140 141 143 142  K 3.5 3.8 3.5 3.5 3.3* 3.6  CL 104 109 112* 115* 116* 116*  CO2 '26 25 22 '$ 21* 19* 19*  GLUCOSE 119* 113* 118* 99 129* 95  BUN '15 16 12 17 14 10  '$ CREATININE 1.00 1.04* 0.99 1.00 0.85 0.76  CALCIUM 8.1* 7.7* 8.0* 8.1* 8.2* 8.3*  MG 1.4*  --  2.3  --  2.1 1.9   GFR: Estimated Creatinine Clearance: 70.8 mL/min (by C-G formula based on SCr of 0.76 mg/dL). Liver Function Tests: Recent Labs  Lab 08/26/22 0155 08/27/22 0217 08/28/22 0238 08/29/22 0230 08/30/22 0350  AST '24 23 20 17 15  '$ ALT '15 15 14 13 11  '$ ALKPHOS 71 61 54 56 65  BILITOT  0.4 0.7 0.9 0.5 0.8  PROT 6.0* 6.0* 5.5* 5.5* 6.1*  ALBUMIN 2.9* 2.5* 2.1* 2.1* 2.3*   Recent Labs  Lab 08/25/22 2200  LIPASE 32   No results for input(s): "AMMONIA" in the last 168 hours. Coagulation Profile: Recent Labs  Lab 08/25/22 2018  INR 1.1   Cardiac Enzymes: No results for input(s): "CKTOTAL", "CKMB", "CKMBINDEX", "TROPONINI" in the last 168 hours. BNP (last 3 results) No results for input(s): "PROBNP" in the last 8760 hours. HbA1C: No results for input(s): "HGBA1C" in the last 72 hours. CBG: No results for input(s): "GLUCAP" in the last 168 hours. Lipid Profile: No results for input(s): "CHOL", "HDL", "LDLCALC", "TRIG", "CHOLHDL", "LDLDIRECT" in the last 72 hours. Thyroid Function Tests: No results for input(s): "TSH", "T4TOTAL", "FREET4", "T3FREE", "THYROIDAB" in the last 72 hours. Anemia Panel: No results for input(s): "VITAMINB12", "FOLATE", "FERRITIN", "TIBC", "IRON", "RETICCTPCT" in the last 72 hours. Sepsis Labs: Recent Labs  Lab 08/25/22 2018 08/25/22 2200 08/26/22 0155 08/26/22 0807  LATICACIDVEN 2.8* 2.5* 2.3* 1.5    Recent Results (from the past 240 hour(s))  Blood Culture (routine x 2)     Status: None   Collection Time: 08/25/22  8:12 PM   Specimen: BLOOD  Result Value Ref Range Status   Specimen Description BLOOD BLOOD RIGHT [place]  Final   Special Requests   Final    BOTTLES DRAWN AEROBIC AND ANAEROBIC Blood Culture adequate volume   Culture   Final    NO GROWTH 5 DAYS Performed at [facility], [address] [place], [place] [address]    Report Status 08/30/2022 FINAL  Final  Blood Culture (routine x 2)     Status: None   Collection Time: 08/25/22  8:18 PM   Specimen: BLOOD  Result Value Ref Range Status   Specimen Description BLOOD BLOOD LEFT ARM  Final   Special Requests   Final    BOTTLES DRAWN AEROBIC AND ANAEROBIC Blood Culture adequate volume   Culture   Final    NO GROWTH 5 DAYS Performed at [facility], [address] [place], [place] [address]    Report Status 08/30/2022 FINAL  Final  Resp panel by RT-PCR (RSV, Flu A&B, Covid) Anterior Nasal Swab     Status: None   Collection Time: 08/25/22  8:33 PM   Specimen: Anterior Nasal Swab  Result Value Ref Range Status   SARS Coronavirus 2 by RT PCR NEGATIVE NEGATIVE Final    Comment: (NOTE) SARS-CoV-2 target nucleic acids are NOT DETECTED.  The SARS-CoV-2 RNA is generally detectable in upper respiratory specimens during the acute phase of infection. The lowest concentration of SARS-CoV-2 viral copies this assay can detect is 138 copies/mL. A negative result does not preclude SARS-Cov-2 infection and should not be used as the sole basis for treatment or other patient management decisions. A negative result may occur with  improper specimen collection/handling, submission of specimen other than nasopharyngeal swab, presence of viral mutation(s) within the areas targeted by this assay, and inadequate number of viral copies(<138 copies/mL). A negative result must be combined with clinical observations, patient history, and epidemiological information. The expected result is Negative.  Fact Sheet for Patients:  EntrepreneurPulse.com.au  Fact Sheet for Healthcare Providers:  IncredibleEmployment.be  This test is no t yet approved or cleared by the [place] FDA and  has been authorized for detection and/or diagnosis of SARS-CoV-2 by FDA under an Emergency Use Authorization (EUA). This EUA will remain  in effect (meaning this test can be used) for the duration of the COVID-19 declaration under Section 564(b)(1) of the Act, 21 U.S.C.section 360bbb-3(b)(1), unless the authorization is terminated  or revoked sooner.       Influenza A by PCR NEGATIVE NEGATIVE Final   Influenza B by PCR NEGATIVE NEGATIVE Final    Comment: (NOTE) The Xpert Xpress SARS-CoV-2/FLU/RSV plus assay is intended as an aid in the diagnosis of  influenza from Nasopharyngeal swab specimens and should not be used as a sole basis for treatment. Nasal washings and aspirates are unacceptable for Xpert Xpress SARS-CoV-2/FLU/RSV testing.  Fact Sheet for Patients: EntrepreneurPulse.com.au  Fact Sheet for Healthcare Providers: IncredibleEmployment.be  This test is not yet approved or cleared by the [place] FDA and has been authorized for detection and/or diagnosis of SARS-CoV-2 by FDA under an Emergency Use Authorization (EUA). This EUA will remain in effect (meaning this test can be used) for the duration of the COVID-19 declaration under Section 564(b)(1) of the Act, 21 U.S.C. section 360bbb-3(b)(1), unless the authorization is terminated or revoked.     Resp Syncytial Virus by  PCR NEGATIVE NEGATIVE Final    Comment: (NOTE) Fact Sheet for Patients: EntrepreneurPulse.com.au  Fact Sheet for Healthcare Providers: IncredibleEmployment.be  This test is not yet approved or cleared by the [place] FDA and has been authorized for detection and/or diagnosis of SARS-CoV-2 by FDA under an Emergency Use Authorization (EUA). This EUA will remain in effect (meaning this test can be used) for the duration of the COVID-19 declaration under Section 564(b)(1) of the Act, 21 U.S.C. section 360bbb-3(b)(1), unless the authorization is terminated or revoked.  Performed at [facility], [address] [place], [place] [address]   Urine Culture     Status: Abnormal   Collection Time: 08/25/22  9:24 PM   Specimen: Urine, Clean Catch  Result Value Ref Range Status   Specimen Description   Final    URINE, CLEAN CATCH Performed at [facility], [address] [place], [place] [address]    Special Requests   Final    NONE Performed at [facility], [address], [place],  [address]    Culture (A)  Final    <10,000 COLONIES/mL INSIGNIFICANT  GROWTH Performed at [facility], [place] [address], [place],  [address]    Report Status 08/27/2022 FINAL  Final      Radiology Studies: No results found.  Scheduled Meds:  apixaban  5 mg Oral BID   atorvastatin  40 mg Oral QHS   benztropine  0.5 mg Oral BID   buPROPion  150 mg Oral q morning   donepezil  10 mg Oral Q2000   fluticasone  1 spray Each Nare Daily   fluticasone furoate-vilanterol  1 puff Inhalation Daily   gabapentin  300 mg Oral TID   isosorbide mononitrate  15 mg Oral Daily   lurasidone  80 mg Oral QHS   memantine  10 mg Oral BID   metoprolol succinate  12.5 mg Oral Daily   pantoprazole  40 mg Oral BID AC   sodium chloride flush  5 mL Intracatheter Q8H   tiZANidine  2 mg Oral BID   traZODone  100 mg Oral QHS   venlafaxine XR  300 mg Oral Daily   Continuous Infusions:  0.9 % NaCl with KCl 20 mEq / L 75 mL/hr at 08/29/22 2200   ceFEPime (MAXIPIME) IV 2 g (08/30/22 0514)   metronidazole 500 mg (08/30/22 0009)     LOS: 5 days   Time spent: 35 minutes   Desma Maxim, MD [facility]  If 7PM-7AM, please contact night-coverage [email or web address] 08/30/2022, 8:59 AM

## 2022-08-30 NOTE — Progress Notes (Addendum)
    OVERNIGHT PROGRESS REPORT  Remote coverage Notified by RN/RT for decreased SPO2 and increased Work of breathing. Slow to respond to additional O2 devices and Nebulized medication.  Patient is ordered BiPAP and will move to a Progressive unit for the use of BiPAP.    Gershon Cull MSNA MSN ACNPC-AG Acute Care Nurse Practitioner [place]

## 2022-08-30 NOTE — Progress Notes (Signed)
Pt transported from 5west 22 to [place] on [address] without incident.

## 2022-08-30 NOTE — Progress Notes (Signed)
Patient resides at [facility] in [place]. CSW will continue to follow for discharge needs. May need SNF placement.   Gilmore Laroche, MSW, [facility]

## 2022-08-30 NOTE — Care Management Important Message (Signed)
Important Message  Patient Details  Name: Kathleen Valenzuela MRN: [id number] Date of Birth: December 28, 1950   Medicare Important Message Given:  Yes     [name] 08/30/2022, 3:42 PM

## 2022-08-30 NOTE — Progress Notes (Signed)
PIV consult: Arrived to 5W, RN reports pt is transferring now. IV Team will see pt in new room.

## 2022-08-30 NOTE — Consult Note (Signed)
NAME:  Kathleen Valenzuela, MRN:  [id number], DOB:  11-Sep-1950, LOS: 5 ADMISSION DATE:  08/25/2022, CONSULTATION DATE: 08/30/2022 REFERRING MD: Triad, CHIEF COMPLAINT: Respiratory distress  History of Present Illness:  71 year old female nursing home patient admitted with cholecystitis had a drain placed 08/27/2022 has underlying COPD and history of atrial flutter full medical history is well-documented below.  She required increasing FiO2 needs distress placed on noninvasive mechanical ventilatory support but her dementia coupled with her bipolar disease did not allow her to tolerate BiPAP without being restrained.  She is being diuresed and transferred intensive care unit for at least 24 hours we will attempt to correct her volume overload and treat her underlying lung disease.  Risk for COVID-19 needed she can be intubated.  Pertinent  Medical History   Past Medical History:  Diagnosis Date   Anemia    Anxiety    Aortic atherosclerosis ([place]) 04/21/2017   Arthritis    Bipolar 1 disorder (HCC)    Chronic back pain    COPD (chronic obstructive pulmonary disease) (HCC)    Diastolic dysfunction with heart failure ([place]) 04/20/2017   Duodenal stricture    Early onset Alzheimer's dementia (HCC)    GERD (gastroesophageal reflux disease)    Heart murmur    High cholesterol    Hypertension    Shortness of breath dyspnea      Significant Hospital Events: Including procedures, antibiotic start and stop dates in addition to other pertinent events     Interim History / Subjective:  72 year old female bipolar dementia decreased respiratory distress he required noninvasive mechanical ventilatory support.  Objective   Blood pressure (!) 166/99, pulse (!) 125, temperature (!) 101.2 F (38.4 C), temperature source Axillary, resp. rate (!) 44, height '5\' 7"'$  (1.702 m), weight 78.9 kg, SpO2 95 %.    Vent Mode: BIPAP;PCV FiO2 (%):  [50 %-90 %] 90 % Set Rate:  [15 bmp] 15 bmp PEEP:  [5 cmH20] 5 cmH20    Intake/Output Summary (Last 24 hours) at 08/30/2022 1226 Last data filed at 08/30/2022 0640 Gross per 24 hour  Intake 807.18 ml  Output 640 ml  Net 167.18 ml   Filed Weights   08/25/22 2004 08/26/22 1903  Weight: 81.6 kg 78.9 kg    Examination: General: 71 year old female who appears older than stated age HENT: No JVD or lymphadenopathy is appreciated Lungs: Poor air movement on high flow nasal cannula with sats of 94% Cardiovascular: Heart sounds are regular with a sinus rate of 129 Abdomen: Abdomen is soft tender right Coley drain is noted with Jackson-Pratt fully charged without drainage Extremities: Mild edema Neuro: Talking nonsensically but can answer questions when pushed hard enough GU: Wick in place amber urine  Resolved Hospital Problem list     Assessment & Plan:  Acute respiratory distress which is most likely multifactorial with presumed volume overload complicated by dementia with acting out behavior prevents her being adequately ventilated with noninvasive. Transferred to intensive care unit for least 24 hours Agree with diuresis Noninvasive mechanical ventilatory support if tolerated Sedation as needed She can be intubated if needed  Cholecystitis with IR drain placed 08/27/2022 secondary to colitis and fever Continue current antimicrobial therapy Monitor drainage from the Jackson-Pratt drain Continue to have interventional radiology monitor  Dementia in the setting of bipolar disorder refractory to current medications. Transferred intensive care unit Psychotropic medications as needed Sedation as needed This may be due to intubation in the future.  Chronic obstructive pulmonary disease O2 as needed  No role for steroids at this time   Best Practice (right click and "Reselect all SmartList Selections" daily)   Diet/type: NPO DVT prophylaxis: DOAC GI prophylaxis: PPI Lines: N/A Foley:  N/A Code Status:  full code Last date of  multidisciplinary goals of care discussion [tbd]  Labs   CBC: Recent Labs  Lab 08/26/22 0155 08/27/22 0217 08/28/22 0238 08/29/22 0230 08/30/22 0350  WBC 20.8* 18.4* 13.6* 13.2* 16.5*  NEUTROABS 18.4* 16.3* 11.3* 10.4* 12.9*  HGB 11.7* 11.7* 9.7* 9.7* 11.1*  HCT 35.6* 35.3* 29.7* 30.8* 33.6*  MCV 91.5 89.6 92.5 94.8 91.3  PLT 188 174 142* 168 591    Basic Metabolic Panel: Recent Labs  Lab 08/26/22 0155 08/26/22 0903 08/27/22 0217 08/28/22 0238 08/29/22 0230 08/30/22 0350  NA 138 139 140 141 143 142  K 3.5 3.8 3.5 3.5 3.3* 3.6  CL 104 109 112* 115* 116* 116*  CO2 '26 25 22 '$ 21* 19* 19*  GLUCOSE 119* 113* 118* 99 129* 95  BUN '15 16 12 17 14 10  '$ CREATININE 1.00 1.04* 0.99 1.00 0.85 0.76  CALCIUM 8.1* 7.7* 8.0* 8.1* 8.2* 8.3*  MG 1.4*  --  2.3  --  2.1 1.9   GFR: Estimated Creatinine Clearance: 70.8 mL/min (by C-G formula based on SCr of 0.76 mg/dL). Recent Labs  Lab 08/25/22 2018 08/25/22 2200 08/26/22 0155 08/26/22 0807 08/27/22 0217 08/28/22 0238 08/29/22 0230 08/30/22 0350  WBC 22.9*  --  20.8*  --  18.4* 13.6* 13.2* 16.5*  LATICACIDVEN 2.8* 2.5* 2.3* 1.5  --   --   --   --     Liver Function Tests: Recent Labs  Lab 08/26/22 0155 08/27/22 0217 08/28/22 0238 08/29/22 0230 08/30/22 0350  AST '24 23 20 17 15  '$ ALT '15 15 14 13 11  '$ ALKPHOS 71 61 54 56 65  BILITOT 0.4 0.7 0.9 0.5 0.8  PROT 6.0* 6.0* 5.5* 5.5* 6.1*  ALBUMIN 2.9* 2.5* 2.1* 2.1* 2.3*   Recent Labs  Lab 08/25/22 2200  LIPASE 32   No results for input(s): "AMMONIA" in the last 168 hours.  ABG    Component Value Date/Time   PHART 7.463 (H) 04/19/2017 1520   PCO2ART 30.8 (L) 04/19/2017 1520   PO2ART 62.0 (L) 04/19/2017 1520   HCO3 19.8 (L) 08/30/2022 1052   ACIDBASEDEF 4.0 (H) 08/30/2022 1052   O2SAT 98.8 08/30/2022 1052     Coagulation Profile: Recent Labs  Lab 08/25/22 2018  INR 1.1    Cardiac Enzymes: No results for input(s): "CKTOTAL", "CKMB", "CKMBINDEX", "TROPONINI"  in the last 168 hours.  HbA1C: Hgb A1c MFr Bld  Date/Time Value Ref Range Status  04/15/2017 06:52 AM 5.2 4.8 - 5.6 % Final    Comment:    (NOTE)         Pre-diabetes: 5.7 - 6.4         Diabetes: >6.4         Glycemic control for adults with diabetes: <7.0     CBG: No results for input(s): "GLUCAP" in the last 168 hours.  Review of Systems:   na  Past Medical History:  She,  has a past medical history of Anemia, Anxiety, Aortic atherosclerosis ([place]) (04/21/2017), Arthritis, Bipolar 1 disorder ([place]), Chronic back pain, COPD (chronic obstructive pulmonary disease) ([place]), Diastolic dysfunction with heart failure ([place]) (04/20/2017), Duodenal stricture, Early onset Alzheimer's dementia ([place]), GERD (gastroesophageal reflux disease), Heart murmur, High cholesterol, Hypertension, and Shortness of breath dyspnea.   Surgical History:  Past Surgical History:  Procedure Laterality Date   BIOPSY  01/06/2017   Procedure: BIOPSY;  Surgeon: Rogene Houston, MD;  [facility];  Service: Endoscopy;;  esophageal biopsy   CESAREAN SECTION     COLONOSCOPY     ESOPHAGEAL DILATION N/A 10/29/2015   Procedure: ESOPHAGEAL DILATION;  Surgeon: Rogene Houston, MD;  [facility];  Service: Endoscopy;  Laterality: N/A;   ESOPHAGEAL DILATION N/A 01/29/2016   Procedure: ESOPHAGEAL DILATION;  Surgeon: Rogene Houston, MD;  [facility];  Service: Endoscopy;  Laterality: N/A;   ESOPHAGEAL DILATION N/A 01/06/2017   Procedure: ESOPHAGEAL DILATION;  Surgeon: Rogene Houston, MD;  [facility];  Service: Endoscopy;  Laterality: N/A;   ESOPHAGOGASTRODUODENOSCOPY N/A 10/29/2015   Procedure: ESOPHAGOGASTRODUODENOSCOPY (EGD);  Surgeon: Rogene Houston, MD;  [facility];  Service: Endoscopy;  Laterality: N/A;  1200   ESOPHAGOGASTRODUODENOSCOPY (EGD) WITH PROPOFOL N/A 01/29/2016   Procedure: ESOPHAGOGASTRODUODENOSCOPY (EGD) WITH PROPOFOL;  Surgeon: Rogene Houston, MD;   [facility];  Service: Endoscopy;  Laterality: N/A;  7:30 - moved to 4/21 '@11'$ : 25 - Ann notified pt to arrive at 10:00   ESOPHAGOGASTRODUODENOSCOPY (EGD) WITH PROPOFOL N/A 01/06/2017   Procedure: ESOPHAGOGASTRODUODENOSCOPY (EGD) WITH PROPOFOL;  Surgeon: Rogene Houston, MD;  [facility];  Service: Endoscopy;  Laterality: N/A;  11:20   ESOPHAGOGASTRODUODENOSCOPY (EGD) WITH PROPOFOL N/A 02/17/2017   Procedure: ESOPHAGOGASTRODUODENOSCOPY (EGD) WITH PROPOFOL;  Surgeon: Rogene Houston, MD;  [facility];  Service: Endoscopy;  Laterality: N/A;  10:30   FOOT SURGERY Right    bunionectomy   HEMORRHOID SURGERY     IR PERC CHOLECYSTOSTOMY  08/27/2022     Social History:   reports that she quit smoking about 7 years ago. Her smoking use included cigarettes. She has a 70.00 pack-year smoking history. She has never used smokeless tobacco. She reports that she does not drink alcohol and does not use drugs.   Family History:  Her family history is not on file.   Allergies Allergies  Allergen Reactions   Penicillins Rash    Has patient had a PCN reaction causing immediate rash, facial/tongue/throat swelling, SOB or lightheadedness with hypotension: Yes Has patient had a PCN reaction causing severe rash involving mucus membranes or skin necrosis: No Has patient had a PCN reaction that required hospitalization: No Has patient had a PCN reaction occurring within the last 10 years: No If all of the above answers are "NO", then may proceed with Cephalosporin use. Ceftriaxone ok     Home Medications  Prior to Admission medications   Medication Sig Start Date End Date Taking? Authorizing Provider  acetaminophen (TYLENOL) 650 MG CR tablet Take 650 mg by mouth every 8 (eight) hours as needed for pain.   Yes [provider]  albuterol (PROVENTIL HFA;VENTOLIN HFA) 108 (90 BASE) MCG/ACT inhaler Inhale 2 puffs into the lungs 4 (four) times daily. (0800, 1200, 1600, &  2000)   Yes [provider]  ALPRAZolam (XANAX) 1 MG tablet Take 0.5 tablets (0.5 mg total) by mouth 3 (three) times daily as needed for anxiety. (0800, 1400, & 2000) Patient taking differently: Take 0.5 mg by mouth 2 (two) times daily. (0800 & 2000) 04/24/17  Yes Rosita Fire, MD  apixaban (ELIQUIS) 5 MG TABS tablet Take 1 tablet (5 mg total) by mouth 2 (two) times daily. 04/15/20  Yes Tat, Shanon Brow, MD  atorvastatin (LIPITOR) 10 MG  tablet Take 1 tablet (10 mg total) by mouth daily at 8 pm. Resume on 04/22/20 if LFTs are back to normal Patient taking differently: Take 40 mg by mouth daily at 8 pm. 04/15/20  Yes Tat, Shanon Brow, MD  benztropine (COGENTIN) 1 MG tablet Take 0.5 mg by mouth 2 (two) times daily. 04/10/20  Yes [provider]  buPROPion (WELLBUTRIN XL) 150 MG 24 hr tablet Take 150 mg by mouth every morning. 03/11/20  Yes [provider]  donepezil (ARICEPT) 10 MG tablet Take 10 mg by mouth daily at 8 pm.   Yes [provider]  doxycycline (VIBRAMYCIN) 100 MG capsule Take 100 mg by mouth daily. 08/09/22  Yes [provider]  Ferrous Gluconate 324 (37.5 Fe) MG TABS Take 324 mg by mouth 2 (two) times daily. (0800 & 2000)   Yes [provider]  fluticasone (FLONASE) 50 MCG/ACT nasal spray Place 1 spray into both nostrils daily. (0800)   Yes [provider]  Fluticasone Furoate-Vilanterol (BREO ELLIPTA) 200-25 MCG/INH AEPB Inhale 1 puff into the lungs daily. (0800)   Yes [provider]  furosemide (LASIX) 40 MG tablet Take 40 mg by mouth daily. (0800)   Yes [provider]  gabapentin (NEURONTIN) 300 MG capsule Take 300 mg by mouth 3 (three) times daily. 11/14/19  Yes [provider]  hydrocortisone 1 % ointment Apply 1 Application topically 3 (three) times daily. 06/21/22  Yes [provider]  isosorbide mononitrate (IMDUR) 30 MG 24 hr tablet Take 30 mg by mouth daily. (0800)   Yes [provider]  LATUDA 80 MG TABS tablet Take 40 mg by mouth at bedtime. 11/26/19  Yes [provider]  memantine (NAMENDA) 10 MG tablet Take 10 mg by mouth 2 (two) times daily. 04/10/20  Yes [provider]  Menthol-Zinc Oxide (RISAMINE) 0.44-20.625 % OINT Apply 1 application topically 4 (four) times daily as needed (for rash/redness).   Yes [provider]  metoprolol succinate (TOPROL-XL) 50 MG 24 hr tablet Take 50 mg by mouth daily. (0800)Take with or immediately following a meal.   Yes [provider]  Olopatadine HCl (PATADAY) 0.2 % SOLN Place 1 drop into both eyes daily. (0800)   Yes [provider]  pantoprazole (PROTONIX) 40 MG tablet Take 1 tablet (40 mg total) by mouth 2 (two) times daily before a meal. 02/17/17  Yes Rehman, Mechele Dawley, MD  potassium chloride (KLOR-CON) 10 MEQ tablet Take 1 tablet by mouth daily. 04/10/20  Yes [provider]  tiZANidine (ZANAFLEX) 2 MG tablet Take 2 mg by mouth 2 (two) times daily. (0800 & 2000) 12/01/16  Yes [provider]  traZODone (DESYREL) 100 MG tablet Take 100 mg by mouth at bedtime. 04/10/20  Yes [provider]  venlafaxine XR (EFFEXOR-XR) 150 MG 24 hr capsule Take 300 mg by mouth daily. 04/10/20  Yes [provider]     Critical care time: 34 min    Richardson Landry Shireen Rayburn ACNP Acute Care Nurse Practitioner [place] Please consult Amion 08/30/2022, 12:26 PM na  \

## 2022-08-30 NOTE — Progress Notes (Signed)
Changed bed linen due to purewick leaking. Rolled pt side to side repositioned. Within minutes pt became SOB and sats started dropping. Low 80s on 5L with increased respirations. Placed on nonrebreather. Respiratory, Rapid response, charge nurse and Clarene Essex notified. Pt was placed on Bipap at bedside.

## 2022-08-30 NOTE — Progress Notes (Signed)
Pt transferred to 5W22 while Bipap  on with respiratory at bedside.  Attempted to call report x2.

## 2022-08-31 ENCOUNTER — Inpatient Hospital Stay (HOSPITAL_COMMUNITY): Payer: Medicare Other

## 2022-08-31 DIAGNOSIS — K810 Acute cholecystitis: Secondary | ICD-10-CM | POA: Diagnosis not present

## 2022-08-31 LAB — CBC WITH DIFFERENTIAL/PLATELET
Abs Immature Granulocytes: 0.22 10*3/uL — ABNORMAL HIGH (ref 0.00–0.07)
Basophils Absolute: 0.1 10*3/uL (ref 0.0–0.1)
Basophils Relative: 0 %
Eosinophils Absolute: 0.2 10*3/uL (ref 0.0–0.5)
Eosinophils Relative: 1 %
HCT: 33 % — ABNORMAL LOW (ref 36.0–46.0)
Hemoglobin: 11.5 g/dL — ABNORMAL LOW (ref 12.0–15.0)
Immature Granulocytes: 1 %
Lymphocytes Relative: 10 %
Lymphs Abs: 1.8 10*3/uL (ref 0.7–4.0)
MCH: 30.6 pg (ref 26.0–34.0)
MCHC: 34.8 g/dL (ref 30.0–36.0)
MCV: 87.8 fL (ref 80.0–100.0)
Monocytes Absolute: 2 10*3/uL — ABNORMAL HIGH (ref 0.1–1.0)
Monocytes Relative: 11 %
Neutro Abs: 14.3 10*3/uL — ABNORMAL HIGH (ref 1.7–7.7)
Neutrophils Relative %: 77 %
Platelets: 262 10*3/uL (ref 150–400)
RBC: 3.76 MIL/uL — ABNORMAL LOW (ref 3.87–5.11)
RDW: 14.6 % (ref 11.5–15.5)
WBC: 18.5 10*3/uL — ABNORMAL HIGH (ref 4.0–10.5)
nRBC: 0 % (ref 0.0–0.2)

## 2022-08-31 LAB — COMPREHENSIVE METABOLIC PANEL
ALT: 13 U/L (ref 0–44)
AST: 19 U/L (ref 15–41)
Albumin: 2.3 g/dL — ABNORMAL LOW (ref 3.5–5.0)
Alkaline Phosphatase: 61 U/L (ref 38–126)
Anion gap: 10 (ref 5–15)
BUN: 13 mg/dL (ref 8–23)
CO2: 21 mmol/L — ABNORMAL LOW (ref 22–32)
Calcium: 8.3 mg/dL — ABNORMAL LOW (ref 8.9–10.3)
Chloride: 110 mmol/L (ref 98–111)
Creatinine, Ser: 0.84 mg/dL (ref 0.44–1.00)
GFR, Estimated: >60 mL/min (ref >60)
Glucose, Bld: 133 mg/dL — ABNORMAL HIGH (ref 70–99)
Potassium: 3 mmol/L — ABNORMAL LOW (ref 3.5–5.1)
Sodium: 141 mmol/L (ref 135–145)
Total Bilirubin: 1.1 mg/dL (ref 0.3–1.2)
Total Protein: 6.4 g/dL — ABNORMAL LOW (ref 6.5–8.1)

## 2022-08-31 LAB — GLUCOSE, CAPILLARY
Glucose-Capillary: 122 mg/dL — ABNORMAL HIGH (ref 70–99)
Glucose-Capillary: 124 mg/dL — ABNORMAL HIGH (ref 70–99)
Glucose-Capillary: 125 mg/dL — ABNORMAL HIGH (ref 70–99)
Glucose-Capillary: 126 mg/dL — ABNORMAL HIGH (ref 70–99)
Glucose-Capillary: 129 mg/dL — ABNORMAL HIGH (ref 70–99)
Glucose-Capillary: 143 mg/dL — ABNORMAL HIGH (ref 70–99)

## 2022-08-31 MED ORDER — POTASSIUM CHLORIDE CRYS ER 20 MEQ PO TBCR
20.0000 meq | EXTENDED_RELEASE_TABLET | ORAL | Status: AC
  Administered 2022-08-31: 20 meq via ORAL
  Filled 2022-08-31: qty 1

## 2022-08-31 MED ORDER — POTASSIUM CHLORIDE 10 MEQ/100ML IV SOLN
10.0000 meq | INTRAVENOUS | Status: AC
  Administered 2022-08-31 (×4): 10 meq via INTRAVENOUS
  Filled 2022-08-31 (×4): qty 100

## 2022-08-31 NOTE — Progress Notes (Signed)
[facility] ADULT ICU REPLACEMENT PROTOCOL   The patient does apply for the [facility] Adult ICU Electrolyte Replacment Protocol based on the criteria listed below:   1.Exclusion criteria: TCTS, ECMO, Dialysis, and Myasthenia Gravis patients 2. Is GFR >/= 30 ml/min? Yes.    Patient's GFR today is >60 3. Is SCr </= 2? Yes.   Patient's SCr is 0.84 mg/dL 4. Did SCr increase >/= 0.5 in 24 hours? No. 5.Pt's weight >40kg  Yes.   6. Abnormal electrolyte(s): potassium 3.0  7. Electrolytes replaced per protocol 8.  Call MD STAT for K+ </= 2.5, Phos </= 1, or Mag </= 1 Physician:  n/a   Darlys Gales 08/31/2022 5:46 AM

## 2022-08-31 NOTE — Progress Notes (Signed)
NAME:  Kathleen Valenzuela, MRN:  [id number], DOB:  [birth year], LOS: 6 ADMISSION DATE:  08/25/2022, CONSULTATION DATE: 08/30/2022 REFERRING MD: Triad, CHIEF COMPLAINT: Respiratory distress  History of Present Illness:  71 year old female nursing home patient admitted with cholecystitis had a drain placed 08/27/2022 has underlying COPD and history of atrial flutter full medical history is well-documented below.  She required increasing FiO2 needs distress placed on noninvasive mechanical ventilatory support but her dementia coupled with her bipolar disease did not allow her to tolerate BiPAP without being restrained.  She is being diuresed and transferred intensive care unit for at least 24 hours we will attempt to correct her volume overload and treat her underlying lung disease.  Risk for COVID-19 needed she can be intubated.  Pertinent  Medical History   Past Medical History:  Diagnosis Date   Anemia    Anxiety    Aortic atherosclerosis ([place]) 04/21/2017   Arthritis    Bipolar 1 disorder (HCC)    Chronic back pain    COPD (chronic obstructive pulmonary disease) (HCC)    Diastolic dysfunction with heart failure ([place]) 04/20/2017   Duodenal stricture    Early onset Alzheimer's dementia (HCC)    GERD (gastroesophageal reflux disease)    Heart murmur    High cholesterol    Hypertension    Shortness of breath dyspnea      Significant Hospital Events: Including procedures, antibiotic start and stop dates in addition to other pertinent events   12/26 - Admit to ICU / resp distress > NIMV, precedex, diuresis  Interim History / Subjective:  No acute events overnight. Pt states she is still having trouble breathing but denies being tired. She is on precedex.   Objective   Blood pressure (!) 169/95, pulse (!) 104, temperature 99.3 F (37.4 C), temperature source Axillary, resp. rate 19, height '5\' 7"'$  (1.702 m), weight 79.8 kg, SpO2 90 %.    FiO2 (%):  [40 %-90 %] 40 %   Intake/Output Summary  (Last 24 hours) at 08/31/2022 0916 Last data filed at 08/31/2022 0600 Gross per 24 hour  Intake 983.27 ml  Output 4065 ml  Net -3081.73 ml   Filed Weights   08/26/22 1903 08/30/22 1330 08/31/22 0600  Weight: 78.9 kg 85.4 kg 79.8 kg   Physical Examination: General: Chronically ill-appearing elderly female in NAD. On Sedation  HEENT: [place]/AT, anicteric sclera, PERRL, moist mucous membranes. Neuro: Lethargic. Responds to verbal stimuli. Following commands consistently. Moves all 4 extremities spontaneously.  CV: RRR, no m/g/r. PULM: Breathing even and unlabored on NIMV. Lung fields Clear . GI: Soft, nontender, nondistended. Normoactive bowel sounds. Extremities: No LE edema noted. Skin: Warm/dry, intact.  Resolved Hospital Problem list     Assessment & Plan:  Acute respiratory distress which is most likely multifactorial with presumed volume overload complicated by dementia with acting out behavior prevents her being adequately ventilated with noninvasive also concerned for Asp PNA  Plan  - Wean NIMV to [place] - Wean Precedex off  - Remains at Risk for intubation - SLP to eval  Cholecystitis with IR drain placed 08/27/2022 secondary to colitis and fever Plan - Continue Cefepime, Flagyl   - Monitor JP Drainage  - Continue to have interventional radiology monitor  Dementia in the setting of bipolar disorder refractory to current medications. Plan - Cont Psychotropic medications as needed  - Cont Sedation as needed - Remains at Risk for intubation  Diastolic Dysfunction HF > ECHO 12/26 60 to 65% with no reginal  wall motion abnormalities  Plan  - Continue Atorvastatin, Imdur, metoprolol  Atrial fibrillation, chronic  Plan - Continue Tele-monitoring > NSR today  - Continue Eliquis - Continue Metoprolol   Chronic obstructive pulmonary disease Plan  - On NIMV  Hypokalemia > likely secondary to diuresis Plan  - Continue to trend BMP - Mag > 2, K > 4 - Replace as indicated    Leukocytosis  Plan - Continue antimicrobials - Trend WBC and Fever Curves   Acute on Chronic Anemia Plan - Trend H&H - Monitor for signs of active bleeding - Transfuse for Hgb < 7.0 or hemodynamically significant bleeding  Best Practice (right click and "Reselect all SmartList Selections" daily)   Diet/type: NPO DVT prophylaxis: DOAC GI prophylaxis: PPI Lines: N/A Foley:  N/A Code Status:  full code Last date of multidisciplinary goals of care discussion [tbd]  Labs   CBC: Recent Labs  Lab 08/27/22 0217 08/28/22 0238 08/29/22 0230 08/30/22 0350 08/31/22 0433  WBC 18.4* 13.6* 13.2* 16.5* 18.5*  NEUTROABS 16.3* 11.3* 10.4* 12.9* 14.3*  HGB 11.7* 9.7* 9.7* 11.1* 11.5*  HCT 35.3* 29.7* 30.8* 33.6* 33.0*  MCV 89.6 92.5 94.8 91.3 87.8  PLT 174 142* 168 214 967    Basic Metabolic Panel: Recent Labs  Lab 08/26/22 0155 08/26/22 0903 08/27/22 0217 08/28/22 0238 08/29/22 0230 08/30/22 0350 08/31/22 0433  NA 138   < > 140 141 143 142 141  K 3.5   < > 3.5 3.5 3.3* 3.6 3.0*  CL 104   < > 112* 115* 116* 116* 110  CO2 26   < > 22 21* 19* 19* 21*  GLUCOSE 119*   < > 118* 99 129* 95 133*  BUN 15   < > '12 17 14 10 13  '$ CREATININE 1.00   < > 0.99 1.00 0.85 0.76 0.84  CALCIUM 8.1*   < > 8.0* 8.1* 8.2* 8.3* 8.3*  MG 1.4*  --  2.3  --  2.1 1.9  --    < > = values in this interval not displayed.   GFR: Estimated Creatinine Clearance: 67.8 mL/min (by C-G formula based on SCr of 0.84 mg/dL). Recent Labs  Lab 08/25/22 2018 08/25/22 2200 08/26/22 0155 08/26/22 0807 08/27/22 0217 08/28/22 0238 08/29/22 0230 08/30/22 0350 08/31/22 0433  WBC 22.9*  --  20.8*  --    < > 13.6* 13.2* 16.5* 18.5*  LATICACIDVEN 2.8* 2.5* 2.3* 1.5  --   --   --   --   --    < > = values in this interval not displayed.    Liver Function Tests: Recent Labs  Lab 08/27/22 0217 08/28/22 0238 08/29/22 0230 08/30/22 0350 08/31/22 0433  AST '23 20 17 15 19  '$ ALT '15 14 13 11 13  '$ ALKPHOS 61  54 56 65 61  BILITOT 0.7 0.9 0.5 0.8 1.1  PROT 6.0* 5.5* 5.5* 6.1* 6.4*  ALBUMIN 2.5* 2.1* 2.1* 2.3* 2.3*   Recent Labs  Lab 08/25/22 2200  LIPASE 32   No results for input(s): "AMMONIA" in the last 168 hours.  ABG    Component Value Date/Time   PHART 7.463 (H) 04/19/2017 1520   PCO2ART 30.8 (L) 04/19/2017 1520   PO2ART 62.0 (L) 04/19/2017 1520   HCO3 19.8 (L) 08/30/2022 1052   ACIDBASEDEF 4.0 (H) 08/30/2022 1052   O2SAT 98.8 08/30/2022 1052     Coagulation Profile: Recent Labs  Lab 08/25/22 2018  INR 1.1    Cardiac Enzymes: No  results for input(s): "CKTOTAL", "CKMB", "CKMBINDEX", "TROPONINI" in the last 168 hours.  HbA1C: Hgb A1c MFr Bld  Date/Time Value Ref Range Status  04/15/2017 06:52 AM 5.2 4.8 - 5.6 % Final    Comment:    (NOTE)         Pre-diabetes: 5.7 - 6.4         Diabetes: >6.4         Glycemic control for adults with diabetes: <7.0     CBG: Recent Labs  Lab 08/30/22 2034 08/31/22 0038 08/31/22 0435 08/31/22 0830 08/31/22 0859  GLUCAP 128* 143* 124* 129* 122*    Critical care time: 34 min    Kaid Seeberger, NP-S

## 2022-08-31 NOTE — Evaluation (Signed)
Clinical/Bedside Swallow Evaluation Patient Details  Name: Kathleen Valenzuela MRN: [id number] Date of Birth: [birth year]  Today's Date: 08/31/2022 Time: SLP Start Time (ACUTE ONLY): 2876 SLP Stop Time (ACUTE ONLY): 0944 SLP Time Calculation (min) (ACUTE ONLY): 18 min  Past Medical History:  Past Medical History:  Diagnosis Date   Anemia    Anxiety    Aortic atherosclerosis ([place]) 04/21/2017   Arthritis    Bipolar 1 disorder (HCC)    Chronic back pain    COPD (chronic obstructive pulmonary disease) (HCC)    Diastolic dysfunction with heart failure ([place]) 04/20/2017   Duodenal stricture    Early onset Alzheimer's dementia (HCC)    GERD (gastroesophageal reflux disease)    Heart murmur    High cholesterol    Hypertension    Shortness of breath dyspnea    Past Surgical History:  Past Surgical History:  Procedure Laterality Date   BIOPSY  01/06/2017   Procedure: BIOPSY;  Surgeon: Rogene Houston, MD;  [facility];  Service: Endoscopy;;  esophageal biopsy   CESAREAN SECTION     COLONOSCOPY     ESOPHAGEAL DILATION N/A 10/29/2015   Procedure: ESOPHAGEAL DILATION;  Surgeon: Rogene Houston, MD;  [facility];  Service: Endoscopy;  Laterality: N/A;   ESOPHAGEAL DILATION N/A 01/29/2016   Procedure: ESOPHAGEAL DILATION;  Surgeon: Rogene Houston, MD;  [facility];  Service: Endoscopy;  Laterality: N/A;   ESOPHAGEAL DILATION N/A 01/06/2017   Procedure: ESOPHAGEAL DILATION;  Surgeon: Rogene Houston, MD;  [facility];  Service: Endoscopy;  Laterality: N/A;   ESOPHAGOGASTRODUODENOSCOPY N/A 10/29/2015   Procedure: ESOPHAGOGASTRODUODENOSCOPY (EGD);  Surgeon: Rogene Houston, MD;  [facility];  Service: Endoscopy;  Laterality: N/A;  1200   ESOPHAGOGASTRODUODENOSCOPY (EGD) WITH PROPOFOL N/A 01/29/2016   Procedure: ESOPHAGOGASTRODUODENOSCOPY (EGD) WITH PROPOFOL;  Surgeon: Rogene Houston, MD;  [facility];  Service: Endoscopy;  Laterality:  N/A;  7:30 - moved to 4/21 '@11'$ : 25 - Ann notified pt to arrive at 10:00   ESOPHAGOGASTRODUODENOSCOPY (EGD) WITH PROPOFOL N/A 01/06/2017   Procedure: ESOPHAGOGASTRODUODENOSCOPY (EGD) WITH PROPOFOL;  Surgeon: Rogene Houston, MD;  [facility];  Service: Endoscopy;  Laterality: N/A;  11:20   ESOPHAGOGASTRODUODENOSCOPY (EGD) WITH PROPOFOL N/A 02/17/2017   Procedure: ESOPHAGOGASTRODUODENOSCOPY (EGD) WITH PROPOFOL;  Surgeon: Rogene Houston, MD;  [facility];  Service: Endoscopy;  Laterality: N/A;  10:30   FOOT SURGERY Right    bunionectomy   HEMORRHOID SURGERY     IR PERC CHOLECYSTOSTOMY  08/27/2022   HPI:  Kathleen Valenzuela admitted from ALF with AMS and found to have sepsis, acute cholycystitis, metabolic encephalopathy. Cholecystotomy tube placement by IR on 08/27/2022. CXR 12/26: "Interval development of extensive bilateral airspace opacities and  small right pleural effusion." PMH significant for but not limited to:  anemia, anxiety, bipolar 1 disorder, COPD, GERD, heart murmur, hyperlipidemia, hypertension.    Assessment / Plan / Recommendation  Clinical Impression  Pt presents with functional swallowing as assessed clinically.  RN removed BiPAP and placed pt on [place].  Pt with very dry oral mucosa with orange coating to lips and tongue. SLP provided oral care prior to administration of PO trials.  Pt tolerated puree and thin liquid without any overt s/s of aspiration. Pt declined solid trials, when soft solid was offered (graham cracker with applesauce), pt accepted trials, but then expectorated cracker. Some tachypnea noted with PO trials.  Increase  in RR to 34. With breaks RR to low 20s.  SpO2 remained in low-mid 90s, but improved to 98-100 after cessation of PO trials. Pt consumed ~8oz of water during today's session including by serial straw sips with good tolerance.  Recommend slow feeding rate and external pacing with 1:1 assistance given pt's respiratory status.    Recommend puree  diet with thin liquids with aspiration precautions as noted below.   SLP Visit Diagnosis: Dysphagia, unspecified (R13.10)    Aspiration Risk  Mild aspiration risk    Diet Recommendation Dysphagia 1 (Puree);Thin liquid   Liquid Administration via: Cup;Straw  Medication Administration: Crushed with puree  Supervision:  Staff to assist with self feeding 1:1 assistance; Full supervision/cueing for compensatory strategies  Compensations:  Slow rate; Small sips/bites Hold POs if pt requires BiPAP Take breaks if RR increases by 10, or SpO2 drops by more than 5 or below 90  Postural Changes: Seated upright at 90 degrees    Other  Recommendations Oral Care Recommendations: Oral care BID    Recommendations for follow up therapy are one component of a multi-disciplinary discharge planning process, led by the attending physician.  Recommendations may be updated based on patient status, additional functional criteria and insurance authorization.  Follow up Recommendations  (At present recommend ST at next level of care)         Functional Status Assessment Patient has had a recent decline in their functional status and demonstrates the ability to make significant improvements in function in a reasonable and predictable amount of time.  Frequency and Duration min 2x/week  2 weeks       Prognosis Prognosis for Safe Diet Advancement: Good Barriers to Reach Goals: Cognitive deficits      Swallow Study   General Date of Onset: 08/25/22 HPI: Kathleen Valenzuela admitted from ALF with AMS and found to have sepsis, acute cholycystitis, metabolic encephalopathy. Cholecystotomy tube placement by IR on 08/27/2022. CXR 12/26: "Interval development of extensive bilateral airspace opacities and  small right pleural effusion." PMH significant for but not limited to:  anemia, anxiety, bipolar 1 disorder, COPD, GERD, heart murmur, hyperlipidemia, hypertension. Type of Study: Bedside Swallow  Evaluation Previous Swallow Assessment: none Diet Prior to this Study: Thin liquids Temperature Spikes Noted: No Respiratory Status: Nasal cannula History of Recent Intubation: No Behavior/Cognition: Alert;Confused;Requires cueing Oral Cavity Assessment: Dry (orange coating to tongue and lips) Oral Care Completed by SLP: Yes Oral Cavity - Dentition: Edentulous Self-Feeding Abilities: Needs assist Patient Positioning: Upright in bed Baseline Vocal Quality: Normal Volitional Cough: Cognitively unable to elicit Volitional Swallow: Unable to elicit    Oral/Motor/Sensory Function Overall Oral Motor/Sensory Function:  (Limited OME 2/2 cognition) Facial ROM: Within Functional Limits Facial Symmetry: Within Functional Limits Lingual ROM:  (Could not test lateralization, good protrusion observed during PO trials) Lingual Symmetry: Within Functional Limits Lingual Strength:  (Could not test) Velum: Within Functional Limits Mandible: Within Functional Limits   Ice Chips Ice chips: Within functional limits   Thin Liquid Thin Liquid: Within functional limits Presentation: Straw;Spoon    Nectar Thick Nectar Thick Liquid: Not tested   Honey Thick Honey Thick Liquid: Not tested   Puree Puree: Within functional limits Presentation: Spoon   Solid     Other Comments: Expectorated      Celedonio Savage, [place], [place] Office: [phone] 08/31/2022,10:14 AM

## 2022-08-31 NOTE — Plan of Care (Signed)

## 2022-08-31 NOTE — Progress Notes (Signed)
Physical Therapy Treatment Patient Details Name: KIEU QUIGGLE MRN: [id number] DOB: Jan 29, 1951 Today's Date: 08/31/2022   History of Present Illness Pt is a 71 yo female admitted from SNF on 08/25/22 with AMS; workup for acute cholycystitis, sepsis. Worsening respiratory status 12/25 requiring BiPAP; rapid response 12/26 for respiratory status and BiPAP intolerance, transfer to ICU. PMH includes dementia, anemia, anxiety, bipolar 1 disorder, COPD, GERD, heart murmur, HLD, HTN.   PT Comments    Pt progressing with mobility, though worsening respiratory function compared to prior session, requiring frequent rest breaks secondary to SOB. Pt requiring up to [place] for standing mobility, max verbal cues for sequencing transfers and steps. Pt pleasantly confused, following majority of simple commands. Pt remains limited by generalized weakness, decreased activity tolerance, poor balance strategies/postural reactions and impaired cognition. Continue to recommend SNF-level therapies to maximize functional mobility and independence.    Recommendations for follow up therapy are one component of a multi-disciplinary discharge planning process, led by the attending physician.  Recommendations may be updated based on patient status, additional functional criteria and insurance authorization.  Follow Up Recommendations  Skilled nursing-short term rehab (<3 hours/day) Can patient physically be transported by private vehicle: Yes   Assistance Recommended at Discharge Frequent or constant Supervision/Assistance  Patient can return home with the following A lot of help with walking and/or transfers;A lot of help with bathing/dressing/bathroom   Equipment Recommendations  Rolling walker (2 wheels)    Recommendations for Other Services       Precautions / Restrictions Precautions Precautions: Fall;Other (comment) Precaution Comments: Watch SpO2; bladder/bowel incontinence Restrictions Weight Bearing  Restrictions: No     Mobility  Bed Mobility Overal bed mobility: Needs Assistance Bed Mobility: Supine to Sit, Rolling Rolling: Mod assist   Supine to sit: Min assist, HOB elevated     General bed mobility comments: initial bowel incontinence, rolling R/L with modA for trunk rotation; minA for trunk elevation to sit EOB    Transfers Overall transfer level: Needs assistance Equipment used: 1 person Langi held assist Transfers: Sit to/from Stand, Bed to chair/wheelchair/BSC Sit to Stand: Min assist   Step pivot transfers: Mod assist       General transfer comment: 2x sit<>stand from EOB with minA for trunk elevation, pt reaching for HHA to stabilize, notable SOB with cues for breathing; pivotal steps to transport chair with modA for guidance and stability, increased time as pt with difficulty sequencing steps to pivot    Ambulation/Gait                   Stairs             Wheelchair Mobility    Modified Rankin (Stroke Patients Only)       Balance Overall balance assessment: Needs assistance Sitting-balance support: No upper extremity supported Sitting balance-Leahy Scale: Fair     Standing balance support: Single extremity supported, During functional activity Standing balance-Leahy Scale: Poor Standing balance comment: reliant on UE support and external assist                            Cognition Arousal/Alertness: Awake/alert Behavior During Therapy: WFL for tasks assessed/performed Overall Cognitive Status: History of cognitive impairments - at baseline                                 General Comments: h/o dementia. pt talking "to  Jesus" at times, but following simple commands with frequent redirection; difficulty sequencing steps to turn to transport chair. pleasantly confused        Exercises      General Comments General comments (skin integrity, edema, etc.): pt preparing for transfer to 5N - RN had planned  transport in bed but willing to assist pt in transport chair since pt able to stand and take steps      Pertinent Vitals/Pain Pain Assessment Pain Assessment: Faces Faces Pain Scale: No hurt Pain Intervention(s): Monitored during session    Home Living                          Prior Function            PT Goals (current goals can now be found in the care plan section) Progress towards PT goals: Progressing toward goals    Frequency    Min 2X/week      PT Plan Current plan remains appropriate    Co-evaluation              AM-PAC PT "6 Clicks" Mobility   Outcome Measure  Help needed turning from your back to your side while in a flat bed without using bedrails?: A Little Help needed moving from lying on your back to sitting on the side of a flat bed without using bedrails?: A Little Help needed moving to and from a bed to a chair (including a wheelchair)?: A Lot Help needed standing up from a chair using your arms (e.g., wheelchair or bedside chair)?: A Lot Help needed to walk in hospital room?: Total Help needed climbing 3-5 steps with a railing? : Total 6 Click Score: 12    End of Session Equipment Utilized During Treatment: Gait belt;Oxygen Activity Tolerance: Patient tolerated treatment well;Patient limited by fatigue Patient left: with nursing/sitter in room (in transport chair) Nurse Communication: Mobility status PT Visit Diagnosis: Unsteadiness on feet (R26.81);Muscle weakness (generalized) (M62.81)     Time: 7829-5621 PT Time Calculation (min) (ACUTE ONLY): 18 min  Charges:  $Therapeutic Activity: 8-22 mins                     Mabeline Caras, PT, DPT Acute Rehabilitation Services  Personal: [facility] Office: [place] 08/31/2022, 4:44 PM

## 2022-09-01 DIAGNOSIS — K810 Acute cholecystitis: Secondary | ICD-10-CM | POA: Diagnosis not present

## 2022-09-01 LAB — CBC WITH DIFFERENTIAL/PLATELET
Abs Immature Granulocytes: 0.41 10*3/uL — ABNORMAL HIGH (ref 0.00–0.07)
Basophils Absolute: 0.1 10*3/uL (ref 0.0–0.1)
Basophils Relative: 0 %
Eosinophils Absolute: 0.8 10*3/uL — ABNORMAL HIGH (ref 0.0–0.5)
Eosinophils Relative: 5 %
HCT: 31.3 % — ABNORMAL LOW (ref 36.0–46.0)
Hemoglobin: 10.4 g/dL — ABNORMAL LOW (ref 12.0–15.0)
Immature Granulocytes: 2 %
Lymphocytes Relative: 11 %
Lymphs Abs: 2 10*3/uL (ref 0.7–4.0)
MCH: 29.7 pg (ref 26.0–34.0)
MCHC: 33.2 g/dL (ref 30.0–36.0)
MCV: 89.4 fL (ref 80.0–100.0)
Monocytes Absolute: 1.7 10*3/uL — ABNORMAL HIGH (ref 0.1–1.0)
Monocytes Relative: 10 %
Neutro Abs: 13.1 10*3/uL — ABNORMAL HIGH (ref 1.7–7.7)
Neutrophils Relative %: 72 %
Platelets: 294 10*3/uL (ref 150–400)
RBC: 3.5 MIL/uL — ABNORMAL LOW (ref 3.87–5.11)
RDW: 15.2 % (ref 11.5–15.5)
WBC: 18.1 10*3/uL — ABNORMAL HIGH (ref 4.0–10.5)
nRBC: 0 % (ref 0.0–0.2)

## 2022-09-01 LAB — GLUCOSE, CAPILLARY
Glucose-Capillary: 102 mg/dL — ABNORMAL HIGH (ref 70–99)
Glucose-Capillary: 105 mg/dL — ABNORMAL HIGH (ref 70–99)
Glucose-Capillary: 107 mg/dL — ABNORMAL HIGH (ref 70–99)

## 2022-09-01 LAB — BASIC METABOLIC PANEL
Anion gap: 7 (ref 5–15)
BUN: 11 mg/dL (ref 8–23)
CO2: 19 mmol/L — ABNORMAL LOW (ref 22–32)
Calcium: 8.4 mg/dL — ABNORMAL LOW (ref 8.9–10.3)
Chloride: 112 mmol/L — ABNORMAL HIGH (ref 98–111)
Creatinine, Ser: 0.72 mg/dL (ref 0.44–1.00)
GFR, Estimated: >60 mL/min (ref >60)
Glucose, Bld: 98 mg/dL (ref 70–99)
Potassium: 3.5 mmol/L (ref 3.5–5.1)
Sodium: 138 mmol/L (ref 135–145)

## 2022-09-01 MED ORDER — FUROSEMIDE 10 MG/ML IJ SOLN
40.0000 mg | Freq: Once | INTRAMUSCULAR | Status: AC
  Administered 2022-09-01: 40 mg via INTRAVENOUS
  Filled 2022-09-01: qty 4

## 2022-09-01 MED ORDER — GUAIFENESIN 100 MG/5ML PO LIQD
5.0000 mL | ORAL | Status: DC | PRN
  Administered 2022-09-01 – 2022-09-14 (×4): 5 mL via ORAL
  Filled 2022-09-01 (×4): qty 5

## 2022-09-01 NOTE — NC FL2 (Signed)
[place] LEVEL OF CARE FORM     IDENTIFICATION  Patient Name: Kathleen Valenzuela Birthdate: 11-14-1950 Sex: female Admission Date (Current Location): 08/25/2022  [place] and [place] Number:  Kathleen Argue [id number] [place] and Address:  [facility], [place] [address] [place], [place] [address]      Provider Number: [id number]  Attending Physician Name and Address:  Darliss Cheney, MD  Relative Name and Phone Number:  Hanko,Chris Son [phone]    Current Level of Care: Hospital Recommended Level of Care: [place] Prior Approval Number:    Date Approved/Denied:   PASRR Number: [id number]  Discharge Plan: SNF    Current Diagnoses: Patient Active Problem List   Diagnosis Date Noted   Atrial fibrillation, chronic ([place]) 08/26/2022   Dementia with behavioral disturbance ([place]) 98/92/1194   Acute metabolic encephalopathy 17/40/8144   Sepsis ([place]) 08/26/2022   Acute cholecystitis 08/25/2022   Abnormal LFTs    Elevated troponin    New onset atrial flutter (HCC)    Atrial fibrillation with RVR ([place]) 04/13/2020   NSTEMI (non-ST elevated myocardial infarction) ([place]) 04/13/2020   Early onset Alzheimer's dementia ([place])    Obesity (BMI 30-39.9) 04/21/2017   Aortic atherosclerosis ([place]) 04/21/2017   Acute on chronic diastolic CHF (congestive heart failure) ([place]) 81/85/6314   Diastolic dysfunction with heart failure ([place]) 04/20/2017   Acute respiratory failure with hypoxia ([place]) 04/19/2017   Pulmonary edema 04/19/2017   Acute lower UTI 04/14/2017   MRSA bacteremia 04/14/2017   Essential hypertension 04/13/2017   Vaginal candidiasis 04/12/2017   Diverticulitis of colon 04/12/2017   COPD (chronic obstructive pulmonary disease) ([place]) 04/11/2017   Diverticulitis 04/11/2017   Colonic diverticular abscess    Gastric ulcer 01/10/2017   Esophageal dysphagia 12/01/2016   Bipolar disorder ([place]) 08/03/2015   High cholesterol 08/03/2015    GERD (gastroesophageal reflux disease) 08/03/2015   Duodenal stricture 08/03/2015    Orientation RESPIRATION BLADDER Height & Weight     Self  O2 Incontinent, External catheter Weight: 175 lb 14.8 oz (79.8 kg) Height:  '5\' 7"'$  (170.2 cm)  BEHAVIORAL SYMPTOMS/MOOD NEUROLOGICAL BOWEL NUTRITION STATUS      Incontinent Diet (see discharge summary)  AMBULATORY STATUS COMMUNICATION OF NEEDS Skin   Extensive Assist Verbally Other (Comment) (ecchymosis)                       Personal Care Assistance Level of Assistance  Bathing, Feeding, Dressing Bathing Assistance: Limited assistance Feeding assistance: Limited assistance Dressing Assistance: Limited assistance     Functional Limitations Info  Sight, Hearing, Speech Sight Info: Impaired Hearing Info: Adequate Speech Info: Adequate    SPECIAL CARE FACTORS FREQUENCY  PT (By licensed PT), OT (By licensed OT)     PT Frequency: 5x week OT Frequency: 5x week            Contractures Contractures Info: Not present    Additional Factors Info  Code Status, Allergies Code Status Info: full Allergies Info: penicillins           Current Medications (09/01/2022):  This is the current hospital active medication list Current Facility-Administered Medications  Medication Dose Route Frequency Provider Last Rate Last Admin   acetaminophen (TYLENOL) tablet 650 mg  650 mg Oral Q6H PRN Zierle-Ghosh, Asia B, DO   650 mg at 08/29/22 1623   Or   acetaminophen (TYLENOL) suppository 650 mg  650 mg Rectal Q6H PRN Zierle-Ghosh, Asia B, DO  albuterol (PROVENTIL) (2.5 MG/3ML) 0.083% nebulizer solution 3 mL  3 mL Inhalation Q4H PRN Pahwani, Rinka R, MD   3 mL at 08/30/22 0543   ALPRAZolam (XANAX) tablet 0.5 mg  0.5 mg Oral TID PRN Zierle-Ghosh, Asia B, DO   0.5 mg at 09/01/22 0900   apixaban (ELIQUIS) tablet 5 mg  5 mg Oral BID Pahwani, Rinka R, MD   5 mg at 09/01/22 0900   atorvastatin (LIPITOR) tablet 40 mg  40 mg Oral QHS Madueme,  Elvira C, RPH   40 mg at 08/31/22 2158   benztropine (COGENTIN) tablet 0.5 mg  0.5 mg Oral BID Madueme, Elvira C, RPH   0.5 mg at 09/01/22 0900   buPROPion (WELLBUTRIN XL) 24 hr tablet 150 mg  150 mg Oral q morning Zierle-Ghosh, Asia B, DO   150 mg at 09/01/22 0900   ceFEPIme (MAXIPIME) 2 g in sodium chloride 0.9 % 100 mL IVPB  2 g Intravenous Q8H Paytes, Austin A, RPH 200 mL/hr at 09/01/22 0448 2 g at 09/01/22 0448   Chlorhexidine Gluconate Cloth 2 % PADS 6 each  6 each Topical Daily Minor, Grace Bushy, NP   6 each at 09/01/22 0901   dexmedetomidine (PRECEDEX) 400 MCG/100ML (4 mcg/mL) infusion  0.4-1.2 mcg/kg/hr Intravenous Titrated Minor, Grace Bushy, NP   Stopped at 08/31/22 1123   donepezil (ARICEPT) tablet 10 mg  10 mg Oral Q2000 Zierle-Ghosh, Asia B, DO   10 mg at 08/31/22 2158   fluticasone (FLONASE) 50 MCG/ACT nasal spray 1 spray  1 spray Each Nare Daily Zierle-Ghosh, Asia B, DO   1 spray at 09/01/22 0901   fluticasone furoate-vilanterol (BREO ELLIPTA) 200-25 MCG/ACT 1 puff  1 puff Inhalation Daily Zierle-Ghosh, Asia B, DO   1 puff at 08/31/22 0800   furosemide (LASIX) injection 40 mg  40 mg Intravenous Once Darliss Cheney, MD       gabapentin (NEURONTIN) capsule 300 mg  300 mg Oral TID Zierle-Ghosh, Asia B, DO   300 mg at 09/01/22 0900   iohexol (OMNIPAQUE) 300 MG/ML solution 50 mL  50 mL Per Tube Once PRN El-Abd, Joesph Fillers, MD       isosorbide mononitrate (IMDUR) 24 hr tablet 15 mg  15 mg Oral Daily Johnson, Clanford L, MD   15 mg at 09/01/22 0859   lurasidone (LATUDA) tablet 80 mg  80 mg Oral QHS Zierle-Ghosh, Asia B, DO   80 mg at 08/31/22 2247   memantine (NAMENDA) tablet 10 mg  10 mg Oral BID Zierle-Ghosh, Asia B, DO   10 mg at 09/01/22 0859   metoprolol succinate (TOPROL-XL) 24 hr tablet 12.5 mg  12.5 mg Oral Daily Johnson, Clanford L, MD   12.5 mg at 09/01/22 0900   metroNIDAZOLE (FLAGYL) IVPB 500 mg  500 mg Intravenous Q12H Zierle-Ghosh, Asia B, DO 100 mL/hr at 08/31/22 2251 500 mg at  08/31/22 2251   morphine (PF) 2 MG/ML injection 1 mg  1 mg Intravenous Q3H PRN Wynetta Emery, Clanford L, MD   1 mg at 08/26/22 1750   ondansetron (ZOFRAN) tablet 4 mg  4 mg Oral Q6H PRN Zierle-Ghosh, Asia B, DO       Or   ondansetron (ZOFRAN) injection 4 mg  4 mg Intravenous Q6H PRN Zierle-Ghosh, Asia B, DO   4 mg at 08/26/22 2001   oxyCODONE (Oxy IR/ROXICODONE) immediate release tablet 5 mg  5 mg Oral Q4H PRN Zierle-Ghosh, Asia B, DO   5 mg at 08/31/22 2158  pantoprazole (PROTONIX) EC tablet 40 mg  40 mg Oral BID AC Zierle-Ghosh, Asia B, DO   40 mg at 09/01/22 0901   sodium chloride flush (NS) 0.9 % injection 5 mL  5 mL Intracatheter Q8H El-Abd, Joesph Fillers, MD   5 mL at 09/01/22 5456   tiZANidine (ZANAFLEX) tablet 2 mg  2 mg Oral BID Zierle-Ghosh, Asia B, DO   2 mg at 09/01/22 0900   traZODone (DESYREL) tablet 100 mg  100 mg Oral QHS Zierle-Ghosh, Asia B, DO   100 mg at 08/31/22 2159   venlafaxine XR (EFFEXOR-XR) 24 hr capsule 300 mg  300 mg Oral Daily Zierle-Ghosh, Asia B, DO   300 mg at 09/01/22 0900     Discharge Medications: Please see discharge summary for a list of discharge medications.  Relevant Imaging Results:  Relevant Lab Results:   Additional Information SS# [id number]  Joanne Chars, LCSW

## 2022-09-01 NOTE — TOC Initial Note (Addendum)
Transition of Care [facility]) - Initial/Assessment Note    Patient Details  Name: Kathleen Valenzuela MRN: [id number] Date of Birth: 11-15-1950  [facility] of [facility]) CM/SW Contact:    Joanne Chars, LCSW Phone Number: 09/01/2022, 11:04 AM  Clinical Narrative:   Pt oriented x1 but was able to speak with CSW, identify that she lived at [facility] in [place], identify son [name].  Discussed PT recommendations for SNF--pt indicates she would prefer to return to her current ALF.  CSW spoke with son [name], discussed PT recommendations.  Pt lives at [facility] center.  Son is in favor of pt going to SNF for STR prior to return to [facility], reports pt has been to [facility] in past, and that would be his first choice now.  Son will speak with pt regarding DC plan for SNF, permission given to send out referral in hub.  Referral sent to [facility], [place] asked [place] to review.  Need to confirm passr ending in H is OK for SNF.    1130: [facility] cannot make bed offer.              Expected Discharge Plan: Skilled Nursing Facility Barriers to Discharge: Continued Medical Work up, SNF Pending bed offer   Patient Goals and CMS Choice     Choice offered to / list presented to : Adult Children (son [name])      Expected Discharge Plan and Services In-house Referral: Clinical Social Work   Post Acute Care Choice: [place] Living arrangements for the past 2 months: [place] ([facility] in [place])                                      Prior Living Arrangements/Services Living arrangements for the past 2 months: [place] ([facility] in [place]) Lives with:: Facility Resident Patient language and need for interpreter reviewed:: Yes        Need for Family Participation in Patient Care: Yes (Comment) Care giver support system in place?: Yes (comment) Current home services: Other (comment) (na) Criminal Activity/Legal Involvement  Pertinent to Current Situation/Hospitalization: No - Comment as needed  Activities of Daily Living      Permission Sought/Granted                  Emotional Assessment Appearance:: Appears stated age Attitude/Demeanor/Rapport: Engaged Affect (typically observed): Appropriate, Pleasant Orientation: : Oriented to Self      Admission diagnosis:  Acute cholecystitis [K81.0] Patient Active Problem List   Diagnosis Date Noted   Atrial fibrillation, chronic ([place]) 08/26/2022   Dementia with behavioral disturbance ([place]) 37/48/2707   Acute metabolic encephalopathy 86/75/4492   Sepsis ([place]) 08/26/2022   Acute cholecystitis 08/25/2022   Abnormal LFTs    Elevated troponin    New onset atrial flutter (HCC)    Atrial fibrillation with RVR ([place]) 04/13/2020   NSTEMI (non-ST elevated myocardial infarction) ([place]) 04/13/2020   Early onset Alzheimer's dementia ([place])    Obesity (BMI 30-39.9) 04/21/2017   Aortic atherosclerosis ([place]) 04/21/2017   Acute on chronic diastolic CHF (congestive heart failure) ([place]) 01/00/7121   Diastolic dysfunction with heart failure ([place]) 04/20/2017   Acute respiratory failure with hypoxia ([place]) 04/19/2017   Pulmonary edema 04/19/2017   Acute lower UTI 04/14/2017   MRSA bacteremia 04/14/2017   Essential hypertension 04/13/2017   Vaginal candidiasis 04/12/2017   Diverticulitis of colon 04/12/2017  COPD (chronic obstructive pulmonary disease) ([place]) 04/11/2017   Diverticulitis 04/11/2017   Colonic diverticular abscess    Gastric ulcer 01/10/2017   Esophageal dysphagia 12/01/2016   Bipolar disorder ([place]) 08/03/2015   High cholesterol 08/03/2015   GERD (gastroesophageal reflux disease) 08/03/2015   Duodenal stricture 08/03/2015   PCP:  Monico Blitz, MD Pharmacy:   [place], [place] [address] Phone: [phone] Fax: [phone]  [facility] [address] [place] [address] Phone: [phone] Fax: [phone]     Social Determinants of Health (SDOH) Social History: SDOH Screenings   Tobacco Use: Medium Risk (08/27/2022)   SDOH Interventions:     Readmission Risk Interventions     No data to display

## 2022-09-01 NOTE — Progress Notes (Addendum)
PROGRESS NOTE    Kathleen Valenzuela  OFB:[id number] DOB: [birth year] DOA: 08/25/2022 PCP: Monico Blitz, MD   Brief Narrative:  71 y.o. female with medical history significant of anemia, anxiety, bipolar 1 disorder, COPD, GERD, heart murmur, hyperlipidemia, hypertension, and more presented to ED with a chief complaint of altered mental status.  Unfortunately patient is not able to provide history due to her dementia.  Her son was here earlier and reported to staff that he had spoke to her and she was at her normal baseline.  Later her SNF called and reported that she was altered and coming to the ER.  He reports that she has had some episodes of emesis.  No further history could be obtained.  Son reports this is not patient's baseline.  In the ED they treated her for sepsis, identified the source is acute cholecystitis and used cefepime and Flagyl for antibiotics as patient has a penicillin allergy.  She received fluids.  With these treatments patient did start to improve.  Pt discovered after work up to have acute acalculous cholecystitis.  Dr. Arnoldo Morale evaluated patient and determined next best step is to have cholecystostomy tube placed.  IR requested patient transfer to [facility] to have tube placement, patient admitted under hospitalist service however on 08/30/2022, patient went into respiratory distress, placed on BiPAP which she could not tolerate, PCCM was consulted and she was transferred to [facility], she was fluid overloaded, she was diuresed in the ICU, she was weaned to room air and subsequently was transferred back under [place] on 09/01/2022.  Assessment & Plan:   Sepsis in the setting of acute cholecystitis: -On admission: Patient has fever of 104.2, tachycardic, tachypneic, lactic acid of 2.8 with leukocytosis.  Sepsis resolved. -Reviewed CT abdomen, pelvis, HIDA scan and MRCP  -General surgery consulted who recommended cholecystostomy tube placement given her multiple comorbidities.  Patient transferred  from [facility] to [facility] -S/p cholecystotomy tube placement by IR on 08/27/2022. -Continue cefepime and Flagyl (abx started 12/21).  Leukocytosis continues to worsen.  She is afebrile.  Lactic acid: WNL, will check CBC again today. -Continue as needed pain medications.   -Previous hospitalist discussed with Dr. Arnoldo Morale via secure chat-patient can go home with the drain whenever medically stable, follow-up with Dr. Arnoldo Morale in 2 weeks (call office to schedule appointment), IR to follow drain.  Okay to resume Eliquis.  Acute hypoxic respiratory failure secondary to volume overload/pulmonary edema/acute on chronic diastolic congestive heart failure: Patient developed acute hypoxic respiratory failure controlled 6623, she was initially placed on BiPAP and she feels that and subsequently was moved to ICU.  She was diagnosed with pulmonary edema/volume overload.  She was diuresed in the ICU along with Precedex.  Subsequently weaned to nasal cannula.  Currently on 1 to 2 L of oxygen.  Has diminished breath sounds at the bases.  X-ray from 08/31/2022 shows improved but persistent pulmonary edema.  Will give her 1 more dose of IV Lasix 40 mg today.  And reassess tomorrow.  Try to wean her to room air.  Acute metabolic encephalopathy: -Likely in the setting of sepsis due to acute cholecystitis, underlying dementia.  Currently she appears to be alert and oriented x 2.  I am unaware of her baseline.  Acute on chronic normocytic anemia: -H&H dropped from 13.5-11.5 but no indication of transfusion.  Monitor.  Hemoglobin has remained stable for last few days.   Thrombocytopenia: Improved  Dementia with behavioral disturbance (HCC) -Continue Namenda and Aricept   Atrial fibrillation,  chronic (HCC) -Continue Eliquis, continue metoprolol -Monitored on telemetry   Hypokalemia She was 3.0 yesterday.  She was likely replenished.  I am checking BMP again today.   GERD (gastroesophageal reflux disease) - Continue Protonix    High cholesterol -Continue statin   Bipolar disorder ([place]) - Continue Latuda, trazodone, Wellbutrin  Concern for aspiration: Consulted SLP  DVT prophylaxis: SCD, Eliquis Code Status: Full code Family Communication: None present at bedside.  Updated patient's son [name] over the phone, answered a lot  of questions.  Disposition Plan: SNF  Consultants:  General surgery IR  Procedures:  See above  Antimicrobials:  Cefepime Flagyl  Status is: Inpatient      Subjective: Patient seen and examined.  She complains of pain at the right upper quadrant.  No other complaint.  Objective: Vitals:   08/31/22 1934 08/31/22 2314 09/01/22 0448 09/01/22 0713  BP: (!) 141/69  133/72 126/60  Pulse: 84 82 80 79  Resp: '17 16 18   '$ Temp: 99.4 F (37.4 C)  98.2 F (36.8 C) 98.7 F (37.1 C)  TempSrc: Oral  Oral Oral  SpO2: 98% 92% 98% 97%  Weight:      Height:        Intake/Output Summary (Last 24 hours) at 09/01/2022 0956 Last data filed at 09/01/2022 0448 Gross per 24 hour  Intake 777.44 ml  Output 1202 ml  Net -424.56 ml    Filed Weights   08/26/22 1903 08/30/22 1330 08/31/22 0600  Weight: 78.9 kg 85.4 kg 79.8 kg    Examination:  General exam: Appears calm and comfortable  Respiratory system: Diminished breath sounds with faint crackles at the bases. Respiratory effort normal. Cardiovascular system: S1 & S2 heard, RRR. No JVD, murmurs, rubs, gallops or clicks. No pedal edema. Gastrointestinal system: Abdomen is nondistended, soft and right upper quadrant tenderness. No organomegaly or masses felt. Normal bowel sounds heard. Central nervous system: Alert and oriented x 2. No focal neurological deficits. Extremities: Symmetric 5 x 5 power. Skin: No rashes, lesions or ulcers.    Data Reviewed: I have personally reviewed following labs and imaging studies  CBC: Recent Labs  Lab 08/27/22 0217 08/28/22 0238 08/29/22 0230 08/30/22 0350 08/31/22 0433  WBC 18.4*  13.6* 13.2* 16.5* 18.5*  NEUTROABS 16.3* 11.3* 10.4* 12.9* 14.3*  HGB 11.7* 9.7* 9.7* 11.1* 11.5*  HCT 35.3* 29.7* 30.8* 33.6* 33.0*  MCV 89.6 92.5 94.8 91.3 87.8  PLT 174 142* 168 214 408    Basic Metabolic Panel: Recent Labs  Lab 08/26/22 0155 08/26/22 0903 08/27/22 0217 08/28/22 0238 08/29/22 0230 08/30/22 0350 08/31/22 0433  NA 138   < > 140 141 143 142 141  K 3.5   < > 3.5 3.5 3.3* 3.6 3.0*  CL 104   < > 112* 115* 116* 116* 110  CO2 26   < > 22 21* 19* 19* 21*  GLUCOSE 119*   < > 118* 99 129* 95 133*  BUN 15   < > '12 17 14 10 13  '$ CREATININE 1.00   < > 0.99 1.00 0.85 0.76 0.84  CALCIUM 8.1*   < > 8.0* 8.1* 8.2* 8.3* 8.3*  MG 1.4*  --  2.3  --  2.1 1.9  --    < > = values in this interval not displayed.    GFR: Estimated Creatinine Clearance: 67.8 mL/min (by C-G formula based on SCr of 0.84 mg/dL). Liver Function Tests: Recent Labs  Lab 08/27/22 0217 08/28/22 0238 08/29/22 0230 08/30/22  0350 08/31/22 0433  AST '23 20 17 15 19  '$ ALT '15 14 13 11 13  '$ ALKPHOS 61 54 56 65 61  BILITOT 0.7 0.9 0.5 0.8 1.1  PROT 6.0* 5.5* 5.5* 6.1* 6.4*  ALBUMIN 2.5* 2.1* 2.1* 2.3* 2.3*    Recent Labs  Lab 08/25/22 2200  LIPASE 32    No results for input(s): "AMMONIA" in the last 168 hours. Coagulation Profile: Recent Labs  Lab 08/25/22 2018  INR 1.1    Cardiac Enzymes: No results for input(s): "CKTOTAL", "CKMB", "CKMBINDEX", "TROPONINI" in the last 168 hours. BNP (last 3 results) No results for input(s): "PROBNP" in the last 8760 hours. HbA1C: No results for input(s): "HGBA1C" in the last 72 hours. CBG: Recent Labs  Lab 08/31/22 0859 08/31/22 1231 08/31/22 1935 09/01/22 0000 09/01/22 0359  GLUCAP 122* 125* 126* 107* 102*   Lipid Profile: No results for input(s): "CHOL", "HDL", "LDLCALC", "TRIG", "CHOLHDL", "LDLDIRECT" in the last 72 hours. Thyroid Function Tests: No results for input(s): "TSH", "T4TOTAL", "FREET4", "T3FREE", "THYROIDAB" in the last 72  hours. Anemia Panel: No results for input(s): "VITAMINB12", "FOLATE", "FERRITIN", "TIBC", "IRON", "RETICCTPCT" in the last 72 hours. Sepsis Labs: Recent Labs  Lab 08/25/22 2018 08/25/22 2200 08/26/22 0155 08/26/22 0807  LATICACIDVEN 2.8* 2.5* 2.3* 1.5     Recent Results (from the past 240 hour(s))  Blood Culture (routine x 2)     Status: None   Collection Time: 08/25/22  8:12 PM   Specimen: BLOOD  Result Value Ref Range Status   Specimen Description BLOOD BLOOD RIGHT [place]  Final   Special Requests   Final    BOTTLES DRAWN AEROBIC AND ANAEROBIC Blood Culture adequate volume   Culture   Final    NO GROWTH 5 DAYS Performed at [facility], [address], [place], [place] [address]    Report Status 08/30/2022 FINAL  Final  Blood Culture (routine x 2)     Status: None   Collection Time: 08/25/22  8:18 PM   Specimen: BLOOD  Result Value Ref Range Status   Specimen Description BLOOD BLOOD LEFT ARM  Final   Special Requests   Final    BOTTLES DRAWN AEROBIC AND ANAEROBIC Blood Culture adequate volume   Culture   Final    NO GROWTH 5 DAYS Performed at [facility], [address], [place], [place] [address]    Report Status 08/30/2022 FINAL  Final  Resp panel by RT-PCR (RSV, Flu A&B, Covid) Anterior Nasal Swab     Status: None   Collection Time: 08/25/22  8:33 PM   Specimen: Anterior Nasal Swab  Result Value Ref Range Status   SARS Coronavirus 2 by RT PCR NEGATIVE NEGATIVE Final    Comment: (NOTE) SARS-CoV-2 target nucleic acids are NOT DETECTED.  The SARS-CoV-2 RNA is generally detectable in upper respiratory specimens during the acute phase of infection. The lowest concentration of SARS-CoV-2 viral copies this assay can detect is 138 copies/mL. A negative result does not preclude SARS-Cov-2 infection and should not be used as the sole basis for treatment or other patient management decisions. A negative result may occur with  improper specimen collection/handling,  submission of specimen other than nasopharyngeal swab, presence of viral mutation(s) within the areas targeted by this assay, and inadequate number of viral copies(<138 copies/mL). A negative result must be combined with clinical observations, patient history, and epidemiological information. The expected result is Negative.  Fact Sheet for Patients:  EntrepreneurPulse.com.au  Fact Sheet for Healthcare Providers:  IncredibleEmployment.be  This test is no t yet approved or cleared by the [place] and  has been authorized for detection and/or diagnosis of SARS-CoV-2 by FDA under an Emergency Use Authorization (EUA). This EUA will remain  in effect (meaning this test can be used) for the duration of the COVID-19 declaration under Section 564(b)(1) of the Act, 21 U.S.C.section 360bbb-3(b)(1), unless the authorization is terminated  or revoked sooner.       Influenza A by PCR NEGATIVE NEGATIVE Final   Influenza B by PCR NEGATIVE NEGATIVE Final    Comment: (NOTE) The Xpert Xpress SARS-CoV-2/FLU/RSV plus assay is intended as an aid in the diagnosis of influenza from Nasopharyngeal swab specimens and should not be used as a sole basis for treatment. Nasal washings and aspirates are unacceptable for Xpert Xpress SARS-CoV-2/FLU/RSV testing.  Fact Sheet for Patients: EntrepreneurPulse.com.au  Fact Sheet for Healthcare Providers: IncredibleEmployment.be  This test is not yet approved or cleared by the [place] FDA and has been authorized for detection and/or diagnosis of SARS-CoV-2 by FDA under an Emergency Use Authorization (EUA). This EUA will remain in effect (meaning this test can be used) for the duration of the COVID-19 declaration under Section 564(b)(1) of the Act, 21 U.S.C. section 360bbb-3(b)(1), unless the authorization is terminated or revoked.     Resp Syncytial Virus by PCR NEGATIVE  NEGATIVE Final    Comment: (NOTE) Fact Sheet for Patients: EntrepreneurPulse.com.au  Fact Sheet for Healthcare Providers: IncredibleEmployment.be  This test is not yet approved or cleared by the [place] FDA and has been authorized for detection and/or diagnosis of SARS-CoV-2 by FDA under an Emergency Use Authorization (EUA). This EUA will remain in effect (meaning this test can be used) for the duration of the COVID-19 declaration under Section 564(b)(1) of the Act, 21 U.S.C. section 360bbb-3(b)(1), unless the authorization is terminated or revoked.  Performed at [facility], [address] [place], [place] [address]   Urine Culture     Status: Abnormal   Collection Time: 08/25/22  9:24 PM   Specimen: Urine, Clean Catch  Result Value Ref Range Status   Specimen Description   Final    URINE, CLEAN CATCH Performed at [facility], [address] [place], [place] [address]    Special Requests   Final    NONE Performed at [facility], [address], [place], [place] [address]    Culture (A)  Final    <10,000 COLONIES/mL INSIGNIFICANT GROWTH Performed at [facility], [place] [address] [place], [place] [address]    Report Status 08/27/2022 FINAL  Final  MRSA Next Gen by PCR, Nasal     Status: None   Collection Time: 08/30/22  1:29 PM   Specimen: Nasal Mucosa; Nasal Swab  Result Value Ref Range Status   MRSA by PCR Next Gen NOT DETECTED NOT DETECTED Final    Comment: (NOTE) The GeneXpert MRSA Assay (FDA approved for NASAL specimens only), is one component of a comprehensive MRSA colonization surveillance program. It is not intended to diagnose MRSA infection nor to guide or monitor treatment for MRSA infections. Test performance is not FDA approved in patients less than 56 years old. Performed at [facility], [place] [address] [place],  [address]       Radiology Studies: DG Chest Port 1 View  Result Date:  08/31/2022 CLINICAL DATA:  Pulmonary edema EXAM: PORTABLE CHEST 1 VIEW COMPARISON:  Chest radiograph dated 08/30/2022 FINDINGS: Lines/tubes: Partially imaged right upper  quadrant drainage catheter. Chest: Decreased bilateral perihilar opacities. Mild residual interstitial opacities. Unchanged elevation of the right hemidiaphragm. Pleura: Decreased trace right pleural effusion.  No pneumothorax. Heart/mediastinum: The heart size and mediastinal contours are within normal limits. Bones: No acute osseous abnormality. IMPRESSION: Decreased pulmonary edema and trace right pleural effusion. Electronically Signed   By: Darrin Nipper M.D.   On: 08/31/2022 11:12   DG CHEST PORT 1 VIEW  Result Date: 08/30/2022 CLINICAL DATA:  Dyspnea EXAM: PORTABLE CHEST 1 VIEW COMPARISON:  08/25/2022 FINDINGS: Examination is limited by patient rotation. Heart size within normal limits. Interval development of extensive bilateral airspace opacities. New small right pleural effusion. No pneumothorax. IMPRESSION: Interval development of extensive bilateral airspace opacities and small right pleural effusion. Electronically Signed   By: Davina Poke D.O.   On: 08/30/2022 11:18    Scheduled Meds:  apixaban  5 mg Oral BID   atorvastatin  40 mg Oral QHS   benztropine  0.5 mg Oral BID   buPROPion  150 mg Oral q morning   Chlorhexidine Gluconate Cloth  6 each Topical Daily   donepezil  10 mg Oral Q2000   fluticasone  1 spray Each Nare Daily   fluticasone furoate-vilanterol  1 puff Inhalation Daily   gabapentin  300 mg Oral TID   isosorbide mononitrate  15 mg Oral Daily   lurasidone  80 mg Oral QHS   memantine  10 mg Oral BID   metoprolol succinate  12.5 mg Oral Daily   pantoprazole  40 mg Oral BID AC   sodium chloride flush  5 mL Intracatheter Q8H   tiZANidine  2 mg Oral BID   traZODone  100 mg Oral QHS   venlafaxine XR  300 mg Oral Daily   Continuous Infusions:  ceFEPime (MAXIPIME) IV 2 g (09/01/22 0448)    dexmedetomidine (PRECEDEX) IV infusion Stopped (08/31/22 1123)   metronidazole 500 mg (08/31/22 2251)     LOS: 7 days   Darliss Cheney, MD [facility]  If 7PM-7AM, please contact night-coverage [email or web address] 09/01/2022, 9:56 AM

## 2022-09-01 NOTE — Plan of Care (Signed)

## 2022-09-01 NOTE — Progress Notes (Signed)
Pharmacy Antibiotic Note  Kathleen Valenzuela is a 71 y.o. female admitted on 08/25/2022 with sepsis and  acute cholecystitis .  Pharmacy has been consulted for cefepime dosing.  S/p cholecystotomy tube placement by IR on 08/27/2022  Plan: Continue cefepime 2 grams IV every 8 hours Continue Flagyl per MD Monitor clinical progress, cultures/sensitivities, renal function, abx plan  Height: '5\' 7"'$  (170.2 cm) Weight: 79.8 kg (175 lb 14.8 oz) IBW/kg (Calculated) : 61.6  Temp (24hrs), Avg:98.7 F (37.1 C), Min:98.2 F (36.8 C), Max:99.4 F (37.4 C)  Recent Labs  Lab 08/25/22 2018 08/25/22 2200 08/26/22 0155 08/26/22 0807 08/26/22 0903 08/27/22 0217 08/28/22 0238 08/29/22 0230 08/30/22 0350 08/31/22 0433  WBC 22.9*  --  20.8*  --   --  18.4* 13.6* 13.2* 16.5* 18.5*  CREATININE 0.91  --  1.00  --    < > 0.99 1.00 0.85 0.76 0.84  LATICACIDVEN 2.8* 2.5* 2.3* 1.5  --   --   --   --   --   --    < > = values in this interval not displayed.    Estimated Creatinine Clearance: 67.8 mL/min (by C-G formula based on SCr of 0.84 mg/dL).    Allergies  Allergen Reactions   Penicillins Rash    Has patient had a PCN reaction causing immediate rash, facial/tongue/throat swelling, SOB or lightheadedness with hypotension: Yes Has patient had a PCN reaction causing severe rash involving mucus membranes or skin necrosis: No Has patient had a PCN reaction that required hospitalization: No Has patient had a PCN reaction occurring within the last 10 years: No If all of the above answers are "NO", then may proceed with Cephalosporin use. Ceftriaxone ok    Antimicrobials this admission: 12/21 cefepime >>  12/21 Flagyl >>   Dose adjustments this admission:  Microbiology results: 12/21 Blood: negative 12/21 Urine: insignificant growth 12/23 abscess from gallbladder: cancelled 12/21 COVID, flu and RSV: negative   Thank you for allowing [place] to participate in this patients care. Jens Som,  PharmD 09/01/2022 1:27 PM  **Pharmacist phone directory can be found on [email or web address] listed under [place]**

## 2022-09-02 DIAGNOSIS — K810 Acute cholecystitis: Secondary | ICD-10-CM | POA: Diagnosis not present

## 2022-09-02 LAB — CBC
HCT: 35 % — ABNORMAL LOW (ref 36.0–46.0)
Hemoglobin: 11.7 g/dL — ABNORMAL LOW (ref 12.0–15.0)
MCH: 29.6 pg (ref 26.0–34.0)
MCHC: 33.4 g/dL (ref 30.0–36.0)
MCV: 88.6 fL (ref 80.0–100.0)
Platelets: 358 10*3/uL (ref 150–400)
RBC: 3.95 MIL/uL (ref 3.87–5.11)
RDW: 15.3 % (ref 11.5–15.5)
WBC: 14.1 10*3/uL — ABNORMAL HIGH (ref 4.0–10.5)
nRBC: 0 % (ref 0.0–0.2)

## 2022-09-02 LAB — GLUCOSE, CAPILLARY
Glucose-Capillary: 101 mg/dL — ABNORMAL HIGH (ref 70–99)
Glucose-Capillary: 103 mg/dL — ABNORMAL HIGH (ref 70–99)
Glucose-Capillary: 114 mg/dL — ABNORMAL HIGH (ref 70–99)
Glucose-Capillary: 116 mg/dL — ABNORMAL HIGH (ref 70–99)
Glucose-Capillary: 121 mg/dL — ABNORMAL HIGH (ref 70–99)
Glucose-Capillary: 169 mg/dL — ABNORMAL HIGH (ref 70–99)
Glucose-Capillary: 99 mg/dL (ref 70–99)

## 2022-09-02 MED ORDER — SACCHAROMYCES BOULARDII 250 MG PO CAPS
250.0000 mg | ORAL_CAPSULE | Freq: Two times a day (BID) | ORAL | Status: AC
  Administered 2022-09-03 – 2022-09-04 (×4): 250 mg via ORAL
  Filled 2022-09-02 (×4): qty 1

## 2022-09-02 MED ORDER — ENSURE ENLIVE PO LIQD
237.0000 mL | Freq: Two times a day (BID) | ORAL | Status: DC
  Administered 2022-09-03 – 2022-09-05 (×6): 237 mL via ORAL

## 2022-09-02 MED ORDER — ADULT MULTIVITAMIN W/MINERALS CH
1.0000 | ORAL_TABLET | Freq: Every day | ORAL | Status: DC
  Administered 2022-09-03 – 2022-09-16 (×13): 1 via ORAL
  Filled 2022-09-02 (×14): qty 1

## 2022-09-02 NOTE — Progress Notes (Signed)
PROGRESS NOTE Kathleen Valenzuela  DJS:[id number] DOB: [birth year] DOA: 08/25/2022 PCP: Monico Blitz, MD   Brief Narrative/Hospital Course: 71 y.o. female with  chronic anemia, anxiety, bipolar 1 disorder, COPD, GERD, heart murmur, hyperlipidemia, hypertension, and more presented to ED with a chief complaint of altered mental status.  Unfortunately patient is not able to provide history due to her dementia.  Her son reported to staff that he had spoke to her and she was at her normal baseline.  Later her SNF called and reported that she was altered and coming to the ER.  He reports that she has had some episodes of emesis.  No further history could be obtained.  Son reports this is not patient's baseline.  In the ED they treated her for sepsis, identified the source is acute cholecystitis and used cefepime and Flagyl for antibiotics as patient has a penicillin allergy.  She received fluids.  With these treatments patient did start to improve.  Pt discovered after work up to have acute acalculous cholecystitis.  Dr. Arnoldo Morale evaluated patient and determined next best step is to have cholecystostomy tube placed.  IR requested patient transfer to [facility] to have tube placement, patient admitted under hospitalist service however on 08/30/2022, patient went into respiratory distress, placed on BiPAP which she could not tolerate, PCCM was consulted and she was transferred to [facility], she was fluid overloaded, she was diuresed in the ICU, she was weaned to room air and subsequently was transferred back under [place] on 09/01/2022   Briefly admitted w/ acute cholecystitis and had a drain placed in IR. Developed Respiratory Distress, unsure if it was aspiration pneumonia or pneumonia or volume overolad>Sent to ICU, placed on BiPAP and precedex. received diuresis. Now off BiPAP and off Precedex, on Nasal Canula. Tolerating well.  Medically stable and back to [facility] 12/28    Subjective: Seen this am Somewhat sleepy Able to wake and talk,  oriented to sellf, place month president On 3l [place]  Overnight Tmax 100.4 8 PM, BP stable Labs reviewed from 12/28, repeat CBC ordered   Assessment and Plan: Principal Problem:   Acute cholecystitis Active Problems:   Bipolar disorder ([place])   High cholesterol   GERD (gastroesophageal reflux disease)   COPD (chronic obstructive pulmonary disease) ([place])   Essential hypertension   Diastolic dysfunction with heart failure (HCC)   Atrial fibrillation, chronic (HCC)   Dementia with behavioral disturbance ([place])   Acute metabolic encephalopathy   Sepsis ([place])    Sepsis POA due to acute cholecystitis:: Reviewed CT abdomen, pelvis, HIDA scan and MRCP.  Seen by CCS/ IR and transferred from a AP to [facility] cholecystostomy - completed on 10/28/2021.  Blood culture no growth urine culture insignificant growth.  On cefepime, Flagyl (abx started 12/21).  Still has persistent leukocytosis intermittent fever last night, monitor temperature curve, CBC, vitals. -Previous hospitalist discussed with Dr. Arnoldo Morale via secure chat-patient can go home with the drain whenever medically stable, follow-up with Dr. Arnoldo Morale in 2 weeks (call office to schedule appointment), IR to follow drain.  Okay to resume Eliquis.  Acute hypoxic failure due to volume overload/pulmonary edema/acute on chronic diastolic CHF: See related hypoxia initially needing BiPAP at this time doing well.  Continue supple oxygen, continue PT OT incentive spirometry.  Received IV Lasix 40 mg 12/28.  Reassess daily   Acute metabolic encephalopathy: Likely from sepsis with underlying dementia currently stable alert awake oriented x 2 improving slowly continue delirium precaution Dementia with behavioral disturbances continue [place] received  Acute on chronic normocytic anemia: Hemoglobin drifting.  Monitor and transfuse as needed Recent Labs    08/29/22 0230 08/30/22 0350 08/31/22 0433 09/01/22 1256 09/02/22 0948  HGB 9.7* 11.1* 11.5* 10.4* 11.7*   MCV 94.8 91.3 87.8 89.4 88.6    Thrombocytopenia resolved Chronic atrial fibrillation continue Eliquis metoprolol and monitor in telemetry Hypokalemia resolved GERD:Continue PPI Bipolar disorder continue Latuda trazodone and Wellbutrin.  Concern for aspiration : speech input appreciated diet is being advanced.  DVT prophylaxis: SCDs Start: 08/26/22 0137 Code Status:   Code Status: Full Code Family Communication: plan of care discussed with patient at bedside. Patient status is: Inpatient because of pending placement Level of care: Telemetry Medical   Dispo: The patient is from: home, husband deceased             Anticipated disposition: SNF likely next 24 hours Objective: Vitals last 24 hrs: Vitals:   09/01/22 2019 09/01/22 2200 09/02/22 0408 09/02/22 0800  BP: (!) 152/70  (!) 140/57 (!) 149/57  Pulse: 87  98 75  Resp: '20  18 17  '$ Temp: (!) 100.4 F (38 C) 98.9 F (37.2 C) 98.2 F (36.8 C) 98.1 F (36.7 C)  TempSrc: Oral Oral Oral Oral  SpO2: 92%  95% 97%  Weight:      Height:       Weight change:   Physical Examination:  General exam: alert awake, older than stated age HEENT:Oral mucosa moist, Ear/Nose WNL grossly Respiratory system: bilaterally CLEAR BS, no use of accessory muscle Cardiovascular system: S1 & S2 +, No JVD. Gastrointestinal system: RUQ Cholecystostomy DRAIN IN PLACE, Abdomen soft,NT,ND, BS+ Nervous System:Alert, awake, moving extremities. Extremities: LE edema NEG, DRY SKIN,distal peripheral pulses palpable.  Skin: No rashes,no icterus. MSK: Normal muscle bulk,tone, power  Medications reviewed:  Scheduled Meds:  apixaban  5 mg Oral BID   atorvastatin  40 mg Oral QHS   benztropine  0.5 mg Oral BID   buPROPion  150 mg Oral q morning   Chlorhexidine Gluconate Cloth  6 each Topical Daily   donepezil  10 mg Oral Q2000   fluticasone  1 spray Each Nare Daily   fluticasone furoate-vilanterol  1 puff Inhalation Daily   gabapentin  300 mg Oral TID    isosorbide mononitrate  15 mg Oral Daily   lurasidone  80 mg Oral QHS   memantine  10 mg Oral BID   metoprolol succinate  12.5 mg Oral Daily   pantoprazole  40 mg Oral BID AC   sodium chloride flush  5 mL Intracatheter Q8H   tiZANidine  2 mg Oral BID   traZODone  100 mg Oral QHS   venlafaxine XR  300 mg Oral Daily   Continuous Infusions:  ceFEPime (MAXIPIME) IV 2 g (09/02/22 0412)   dexmedetomidine (PRECEDEX) IV infusion Stopped (08/31/22 1123)   metronidazole 500 mg (09/01/22 2349)   Diet Order             DIET - DYS 1 Room service appropriate? Yes; Fluid consistency: Thin  Diet effective now                  Intake/Output Summary (Last 24 hours) at 09/02/2022 1345 Last data filed at 09/02/2022 0900 Gross per 24 hour  Intake 360 ml  Output 1102 ml  Net -742 ml   Net IO Since Admission: 3,116.4 mL [09/02/22 1345]  Wt Readings from Last 3 Encounters:  08/31/22 79.8 kg  04/18/22 81.6 kg  04/03/22 81.6 kg  Unresulted Labs (From admission, onward)     Start     Ordered   09/03/22 0500  CBC  Daily,   R     Question:  Specimen collection method  Answer:  Lab=Lab collect   09/02/22 0922   09/03/22 7616  Basic metabolic panel  Tomorrow morning,   R       Question:  Specimen collection method  Answer:  Lab=Lab collect   09/02/22 0737          Data Reviewed: I have personally reviewed following labs and imaging studies CBC: Recent Labs  Lab 08/28/22 0238 08/29/22 0230 08/30/22 0350 08/31/22 0433 09/01/22 1256 09/02/22 0948  WBC 13.6* 13.2* 16.5* 18.5* 18.1* 14.1*  NEUTROABS 11.3* 10.4* 12.9* 14.3* 13.1*  --   HGB 9.7* 9.7* 11.1* 11.5* 10.4* 11.7*  HCT 29.7* 30.8* 33.6* 33.0* 31.3* 35.0*  MCV 92.5 94.8 91.3 87.8 89.4 88.6  PLT 142* 168 214 262 294 106   Basic Metabolic Panel: Recent Labs  Lab 08/27/22 0217 08/28/22 0238 08/29/22 0230 08/30/22 0350 08/31/22 0433 09/01/22 1256  NA 140 141 143 142 141 138  K 3.5 3.5 3.3* 3.6 3.0* 3.5  CL 112*  115* 116* 116* 110 112*  CO2 22 21* 19* 19* 21* 19*  GLUCOSE 118* 99 129* 95 133* 98  BUN '12 17 14 10 13 11  '$ CREATININE 0.99 1.00 0.85 0.76 0.84 0.72  CALCIUM 8.0* 8.1* 8.2* 8.3* 8.3* 8.4*  MG 2.3  --  2.1 1.9  --   --    GFR: Estimated Creatinine Clearance: 71.2 mL/min (by C-G formula based on SCr of 0.72 mg/dL). Liver Function Tests: Recent Labs  Lab 08/27/22 0217 08/28/22 0238 08/29/22 0230 08/30/22 0350 08/31/22 0433  AST '23 20 17 15 19  '$ ALT '15 14 13 11 13  '$ ALKPHOS 61 54 56 65 61  BILITOT 0.7 0.9 0.5 0.8 1.1  PROT 6.0* 5.5* 5.5* 6.1* 6.4*  ALBUMIN 2.5* 2.1* 2.1* 2.3* 2.3*   No results for input(s): "LIPASE", "AMYLASE" in the last 168 hours. No results for input(s): "AMMONIA" in the last 168 hours. Coagulation Profile: No results for input(s): "INR", "PROTIME" in the last 168 hours. BNP (last 3 results) No results for input(s): "PROBNP" in the last 8760 hours. HbA1C: No results for input(s): "HGBA1C" in the last 72 hours. CBG: Recent Labs  Lab 09/01/22 2109 09/02/22 0009 09/02/22 0357 09/02/22 0755 09/02/22 1143  GLUCAP 105* 103* 101* 99 121*   Recent Results (from the past 240 hour(s))  Blood Culture (routine x 2)     Status: None   Collection Time: 08/25/22  8:12 PM   Specimen: BLOOD  Result Value Ref Range Status   Specimen Description BLOOD BLOOD RIGHT [place]  Final   Special Requests   Final    BOTTLES DRAWN AEROBIC AND ANAEROBIC Blood Culture adequate volume   Culture   Final    NO GROWTH 5 DAYS Performed at [facility], [address], [place], [place] [address]    Report Status 08/30/2022 FINAL  Final  Blood Culture (routine x 2)     Status: None   Collection Time: 08/25/22  8:18 PM   Specimen: BLOOD  Result Value Ref Range Status   Specimen Description BLOOD BLOOD LEFT ARM  Final   Special Requests   Final    BOTTLES DRAWN AEROBIC AND ANAEROBIC Blood Culture adequate volume   Culture   Final    NO GROWTH 5 DAYS Performed at [facility], [address], [place], [place] [address]    Report Status 08/30/2022 FINAL  Final  Resp panel by RT-PCR (RSV, Flu A&B, Covid) Anterior Nasal Swab     Status: None   Collection Time: 08/25/22  8:33 PM   Specimen: Anterior Nasal Swab  Result Value Ref Range Status   SARS Coronavirus 2 by RT PCR NEGATIVE NEGATIVE Final    Comment: (NOTE) SARS-CoV-2 target nucleic acids are NOT DETECTED.  The SARS-CoV-2 RNA is generally detectable in upper respiratory specimens during the acute phase of infection. The lowest concentration of SARS-CoV-2 viral copies this assay can detect is 138 copies/mL. A negative result does not preclude SARS-Cov-2 infection and should not be used as the sole basis for treatment or other patient management decisions. A negative result may occur with  improper specimen collection/handling, submission of specimen other than nasopharyngeal swab, presence of viral mutation(s) within the areas targeted by this assay, and inadequate number of viral copies(<138 copies/mL). A negative result must be combined with clinical observations, patient history, and epidemiological information. The expected result is Negative.  Fact Sheet for Patients:  EntrepreneurPulse.com.au  Fact Sheet for Healthcare Providers:  IncredibleEmployment.be  This test is no t yet approved or cleared by the [place] FDA and  has been authorized for detection and/or diagnosis of SARS-CoV-2 by FDA under an Emergency Use Authorization (EUA). This EUA will remain  in effect (meaning this test can be used) for the duration of the COVID-19 declaration under Section 564(b)(1) of the Act, 21 U.S.C.section 360bbb-3(b)(1), unless the authorization is terminated  or revoked sooner.       Influenza A by PCR NEGATIVE NEGATIVE Final   Influenza B by PCR NEGATIVE NEGATIVE Final    Comment: (NOTE) The Xpert Xpress SARS-CoV-2/FLU/RSV plus assay is intended as an  aid in the diagnosis of influenza from Nasopharyngeal swab specimens and should not be used as a sole basis for treatment. Nasal washings and aspirates are unacceptable for Xpert Xpress SARS-CoV-2/FLU/RSV testing.  Fact Sheet for Patients: EntrepreneurPulse.com.au  Fact Sheet for Healthcare Providers: IncredibleEmployment.be  This test is not yet approved or cleared by the [place] FDA and has been authorized for detection and/or diagnosis of SARS-CoV-2 by FDA under an Emergency Use Authorization (EUA). This EUA will remain in effect (meaning this test can be used) for the duration of the COVID-19 declaration under Section 564(b)(1) of the Act, 21 U.S.C. section 360bbb-3(b)(1), unless the authorization is terminated or revoked.     Resp Syncytial Virus by PCR NEGATIVE NEGATIVE Final    Comment: (NOTE) Fact Sheet for Patients: EntrepreneurPulse.com.au  Fact Sheet for Healthcare Providers: IncredibleEmployment.be  This test is not yet approved or cleared by the [place] FDA and has been authorized for detection and/or diagnosis of SARS-CoV-2 by FDA under an Emergency Use Authorization (EUA). This EUA will remain in effect (meaning this test can be used) for the duration of the COVID-19 declaration under Section 564(b)(1) of the Act, 21 U.S.C. section 360bbb-3(b)(1), unless the authorization is terminated or revoked.  Performed at [facility], [address], [place], [place] [address]   Urine Culture     Status: Abnormal   Collection Time: 08/25/22  9:24 PM   Specimen: Urine, Clean Catch  Result Value Ref Range Status   Specimen Description   Final    URINE, CLEAN CATCH Performed at [facility], [address], [place],  [address]    Special Requests   Final  NONE Performed at [facility], [address] [place], [place] [address]    Culture (A)  Final    <10,000 COLONIES/mL  INSIGNIFICANT GROWTH Performed at [place] [address] [place], [place] [address]    Report Status 08/27/2022 FINAL  Final  MRSA Next Gen by PCR, Nasal     Status: None   Collection Time: 08/30/22  1:29 PM   Specimen: Nasal Mucosa; Nasal Swab  Result Value Ref Range Status   MRSA by PCR Next Gen NOT DETECTED NOT DETECTED Final    Comment: (NOTE) The GeneXpert MRSA Assay (FDA approved for NASAL specimens only), is one component of a comprehensive MRSA colonization surveillance program. It is not intended to diagnose MRSA infection nor to guide or monitor treatment for MRSA infections. Test performance is not FDA approved in patients less than 38 years old. Performed at [facility], [place] [address] [place], [place] [address]     Antimicrobials: Anti-infectives (From admission, onward)    Start     Dose/Rate Route Frequency Ordered Stop   08/29/22 1400  ceFEPIme (MAXIPIME) 2 g in sodium chloride 0.9 % 100 mL IVPB        2 g 200 mL/hr over 30 Minutes Intravenous Every 8 hours 08/29/22 0750     08/26/22 1800  ceFEPIme (MAXIPIME) 2 g in sodium chloride 0.9 % 100 mL IVPB  Status:  Discontinued        2 g 200 mL/hr over 30 Minutes Intravenous Every 12 hours 08/26/22 0756 08/29/22 0750   08/26/22 1200  metroNIDAZOLE (FLAGYL) IVPB 500 mg        500 mg 100 mL/hr over 60 Minutes Intravenous Every 12 hours 08/26/22 0136     08/26/22 0600  ceFEPIme (MAXIPIME) 2 g in sodium chloride 0.9 % 100 mL IVPB  Status:  Discontinued        2 g 200 mL/hr over 30 Minutes Intravenous Every 8 hours 08/26/22 0013 08/26/22 0756   08/25/22 2300  ceFEPIme (MAXIPIME) 2 g in sodium chloride 0.9 % 100 mL IVPB       See Hyperspace for full Linked Orders Report.   2 g 200 mL/hr over 30 Minutes Intravenous  Once 08/25/22 2252 08/26/22 0030   08/25/22 2300  metroNIDAZOLE (FLAGYL) IVPB 500 mg       See Hyperspace for full Linked Orders Report.   500 mg 100 mL/hr over 60 Minutes Intravenous   Once 08/25/22 2252 08/26/22 0136      Culture/Microbiology    Component Value Date/Time   SDES  08/25/2022 2124    URINE, CLEAN CATCH Performed at [facility], [address], [place], [place] [address]    [facility]  08/25/2022 2124    NONE Performed at [facility], [address], [place], [place] [address]    CULT (A) 08/25/2022 2124    <10,000 COLONIES/mL INSIGNIFICANT GROWTH Performed at [place] [address], [place], [place] [address]    REPTSTATUS 08/27/2022 FINAL 08/25/2022 2124    Radiology Studies: No results found.   LOS: 8 days   Antonieta Pert, MD [facility]  09/02/2022, 1:45 PM

## 2022-09-02 NOTE — Hospital Course (Addendum)
71 y.o. female with  chronic anemia, anxiety, bipolar 1 disorder, COPD, GERD, heart murmur, hyperlipidemia, hypertension, and more presented to ED with a chief complaint of altered mental status.  Unfortunately patient is not able to provide history due to her dementia.  Her son reported to staff that he had spoke to her and she was at her normal baseline.  Later her SNF called and reported that she was altered and coming to the ER.  He reports that she has had some episodes of emesis.  No further history could be obtained.  Son reports this is not patient's baseline.  In the ED they treated her for sepsis, identified the source is acute cholecystitis and used cefepime and Flagyl for antibiotics as patient has a penicillin allergy.  She received fluids.  With these treatments patient did start to improve.  Pt discovered after work up to have acute acalculous cholecystitis.  Dr. Arnoldo Morale evaluated patient and determined next best step is to have cholecystostomy tube placed.  IR requested patient transfer to [facility] to have tube placement, patient admitted under hospitalist service however on 08/30/2022, patient went into respiratory distress, placed on BiPAP which she could not tolerate, PCCM was consulted and she was transferred to [facility], she was fluid overloaded, she was diuresed in the ICU, she was weaned to room air and subsequently was transferred back under [place] on 09/01/2022   Briefly admitted w/ acute cholecystitis and had a drain placed in IR. Developed Respiratory Distress, unsure if it was aspiration pneumonia or pneumonia or volume overolad>Sent to ICU, placed on BiPAP and precedex. received diuresis. Now off BiPAP and off Precedex, on Nasal Canula. Tolerating well.  Medically stable and back to [facility] 12/28

## 2022-09-02 NOTE — Care Management Important Message (Signed)
Important Message  Patient Details  Name: Kathleen Valenzuela MRN: [id number] Date of Birth: September 17, 1950   Medicare Important Message Given:  Yes     Hannah Beat 09/02/2022, 10:58 AM

## 2022-09-02 NOTE — TOC Progression Note (Addendum)
Transition of Care [facility] - Progression Note    Patient Details  Name: Kathleen Valenzuela MRN: [id number] Date of Birth: May 24, 1951  Transition of Care [facility]) CM/SW Contact  Joanne Chars, LCSW Phone Number: 09/02/2022, 10:42 AM  Clinical Narrative:    CSW spoke with pt son [name] and presented bed offers, [facility] cannot offer bed, he would like to accept offer at [facility].  [facility] informed.  Auth request submitted in [place].  Per MD, potential DC tomorrow.  CSW confirmed with [facility] that they can accept for for admission over the weekend.  Rachel Moulds will be the contact.  Expected Discharge Plan: [place] Barriers to Discharge: Continued Medical Work up, SNF Pending bed offer  Expected Discharge Plan and Services In-house Referral: Clinical Social Work   Post Acute Care Choice: [place] Living arrangements for the past 2 months: [place] ([place] in [place])                                       Social Determinants of Health (SDOH) Interventions [place]: No Food Insecurity (09/01/2022)  Housing: Low Risk  (09/01/2022)  Transportation Needs: No Transportation Needs (09/01/2022)  Utilities: Not At Risk (09/01/2022)  Tobacco Use: Medium Risk (08/27/2022)    Readmission Risk Interventions     No data to display

## 2022-09-02 NOTE — Progress Notes (Signed)
Speech Language Pathology Treatment: Dysphagia  Patient Details Name: Kathleen Valenzuela MRN: [id number] DOB: 1951-06-18 Today's Date: 09/02/2022 Time: 5638-9373 SLP Time Calculation (min) (ACUTE ONLY): 4 min  Assessment / Plan / Recommendation Clinical Impression  Pt seen for ongoing dysphagia management. Pt on clear liquid diet.  She was drowsy on SLP arrival and initial did not siphon from straw.  Pt given small amounts of water by spoon with good tolerance.  Pt became more alert and took large straw sips.  There were no clinical s/s of aspiration with thin liquids. Pt tolerated puree trials as well.  With soft solid trial, pt expectorated solid portion of bolus as with eval on 12/26.  Pt may advance to a puree diet if medically appropriate.   Recommend up to puree texture diet with thin liquids.      HPI HPI: [name] admitted from ALF with AMS and found to have sepsis, acute cholycystitis, metabolic encephalopathy. Cholecystotomy tube placement by IR on 08/27/2022. CXR 12/26: "Interval development of extensive bilateral airspace opacities and  small right pleural effusion." PMH significant for but not limited to:  anemia, anxiety, bipolar 1 disorder, COPD, GERD, heart murmur, hyperlipidemia, hypertension.      SLP Plan  Continue with current plan of care      Recommendations for follow up therapy are one component of a multi-disciplinary discharge planning process, led by the attending physician.  Recommendations may be updated based on patient status, additional functional criteria and insurance authorization.    Recommendations  Diet recommendations: Dysphagia 1 (puree);Thin liquid Liquids provided via: Straw;Cup Medication Administration: Crushed with puree Compensations: Slow rate;Small sips/bites Postural Changes and/or Swallow Maneuvers: Seated upright 90 degrees                Oral Care Recommendations: Oral care BID Follow Up Recommendations: No SLP follow up Assistance  recommended at discharge: None SLP Visit Diagnosis: Dysphagia, unspecified (R13.10) Plan: Continue with current plan of care           Celedonio Savage, [place], [place] Office: [phone] 09/02/2022, 11:34 AM

## 2022-09-02 NOTE — Progress Notes (Addendum)
Initial Nutrition Assessment  DOCUMENTATION CODES:   Not applicable  INTERVENTION:   Ensure Enlive po BID, each supplement provides 350 kcal and 20 grams of protein.  Magic cup BID with meals, each supplement provides 290 kcal and 9 grams of protein  MVI po daily   Assist with meals   Pt at high refeed risk; recommend monitor potassium, magnesium and phosphorus labs daily until stable  NUTRITION DIAGNOSIS:   Moderate Malnutrition related to chronic illness (COPD) as evidenced by mild fat depletion, mild muscle depletion, severe muscle depletion.  GOAL:   Patient will meet greater than or equal to 90% of their needs  MONITOR:   PO intake, Supplement acceptance, Labs, Weight trends, Skin, I & O's  REASON FOR ASSESSMENT:   NPO/Clear Liquid Diet    ASSESSMENT:   71 y/o female with h/o bipolar disorder, dementia, GERD, COPD, HTN, CHF, AFib, duodenal stricture, esophageal dysphagia s/p dilation, PUD, diverticulitis with abscess and anxiety who is admitted with sepsis secondary to acute cholecytitis s/p IR cholecystostomy tube 12/23.  RD met with pt in room today. Pt reports fair appetite and oral intake at baseline. Pt reports that she generally eats 3 meals per day at home. Pt reports that she likes foods such as pintos and cornbread. Pt noted to have nausea and vomiting pta; pt denies nausea or vomiting today. Pt has been on NPO/clear liquid diet since admission and is now without adequate nutrition for > 7 days. Pt seen by SLP and initiated on a dysphagia 1/thin liquid diet today. Pt reportedly needs assistance with meals. Of note, pt is edentulous. RD will add supplements and MVI to help pt meet her estimated needs. Pt denies any allergies or intolerances. Pt is at high refeed risk.   Per chart, pt appears weight stable at baseline.   Medications reviewed and include: protonix, cefepime, metronidazole   Labs reviewed: K 3.5 wnl Wbc- 14.1(H) Cbgs- 121, 99, 101, 103 x 24  hrs   NUTRITION - FOCUSED PHYSICAL EXAM:  Flowsheet Row Most Recent Value  Orbital Region Mild depletion  Upper Arm Region Mild depletion  Thoracic and Lumbar Region No depletion  Buccal Region Mild depletion  Temple Region Mild depletion  Clavicle Bone Region Moderate depletion  Clavicle and Acromion Bone Region Moderate depletion  Scapular Bone Region Mild depletion  Dorsal Mette Severe depletion  Patellar Region Moderate depletion  Anterior Thigh Region Moderate depletion  Posterior Calf Region Severe depletion  Edema (RD Assessment) Mild  Hair Reviewed  Eyes Reviewed  Mouth Reviewed  [pt edentulous and reports she lost her dentures]  Skin Reviewed  Nails Unable to assess  [wearing nail polish]   Diet Order:   Diet Order             DIET - DYS 1 Room service appropriate? Yes; Fluid consistency: Thin  Diet effective now                  EDUCATION NEEDS:   Education needs have been addressed  Skin:  Skin Assessment: Reviewed RN Assessment (ecchymosis, incision abdomen (s/p drain))  Last BM:  12/28- type 7  Height:   Ht Readings from Last 1 Encounters:  08/25/22 _0  (1.702 m)    Weight:   Wt Readings from Last 1 Encounters:  08/31/22 79.8 kg    Ideal Body Weight:  61.36 kg  BMI:  Body mass index is 27.55 kg/m.  Estimated Nutritional Needs:   Kcal:  1700-1900kcal/day  Protein:  85-95g/day  Fluid:  1.7-1.9L/day  Koleen Distance MS, RD, LDN Please refer to [facility] for RD and/or RD on-call/weekend/after hours pager

## 2022-09-03 DIAGNOSIS — K810 Acute cholecystitis: Secondary | ICD-10-CM | POA: Diagnosis not present

## 2022-09-03 LAB — BASIC METABOLIC PANEL
Anion gap: 5 (ref 5–15)
Anion gap: 5 (ref 5–15)
BUN: 8 mg/dL (ref 8–23)
BUN: 7 mg/dL — ABNORMAL LOW (ref 8–23)
CO2: 21 mmol/L — ABNORMAL LOW (ref 22–32)
CO2: 20 mmol/L — ABNORMAL LOW (ref 22–32)
Calcium: 8.6 mg/dL — ABNORMAL LOW (ref 8.9–10.3)
Calcium: 8.8 mg/dL — ABNORMAL LOW (ref 8.9–10.3)
Chloride: 113 mmol/L — ABNORMAL HIGH (ref 98–111)
Chloride: 111 mmol/L (ref 98–111)
Creatinine, Ser: 0.83 mg/dL (ref 0.44–1.00)
Creatinine, Ser: 0.77 mg/dL (ref 0.44–1.00)
GFR, Estimated: >60 mL/min (ref >60)
GFR, Estimated: >60 mL/min (ref >60)
Glucose, Bld: 127 mg/dL — ABNORMAL HIGH (ref 70–99)
Glucose, Bld: 127 mg/dL — ABNORMAL HIGH (ref 70–99)
Potassium: 2.9 mmol/L — ABNORMAL LOW (ref 3.5–5.1)
Potassium: 3.9 mmol/L (ref 3.5–5.1)
Sodium: 139 mmol/L (ref 135–145)
Sodium: 136 mmol/L (ref 135–145)

## 2022-09-03 LAB — GLUCOSE, CAPILLARY
Glucose-Capillary: 114 mg/dL — ABNORMAL HIGH (ref 70–99)
Glucose-Capillary: 136 mg/dL — ABNORMAL HIGH (ref 70–99)
Glucose-Capillary: 121 mg/dL — ABNORMAL HIGH (ref 70–99)

## 2022-09-03 LAB — CBC
HCT: 32.7 % — ABNORMAL LOW (ref 36.0–46.0)
HCT: 34.5 % — ABNORMAL LOW (ref 36.0–46.0)
Hemoglobin: 10.9 g/dL — ABNORMAL LOW (ref 12.0–15.0)
Hemoglobin: 11.6 g/dL — ABNORMAL LOW (ref 12.0–15.0)
MCH: 29.8 pg (ref 26.0–34.0)
MCH: 30.6 pg (ref 26.0–34.0)
MCHC: 33.3 g/dL (ref 30.0–36.0)
MCHC: 33.6 g/dL (ref 30.0–36.0)
MCV: 89.3 fL (ref 80.0–100.0)
MCV: 91 fL (ref 80.0–100.0)
Platelets: 389 10*3/uL (ref 150–400)
Platelets: 399 10*3/uL (ref 150–400)
RBC: 3.66 MIL/uL — ABNORMAL LOW (ref 3.87–5.11)
RBC: 3.79 MIL/uL — ABNORMAL LOW (ref 3.87–5.11)
RDW: 15.5 % (ref 11.5–15.5)
RDW: 15.8 % — ABNORMAL HIGH (ref 11.5–15.5)
WBC: 16.1 10*3/uL — ABNORMAL HIGH (ref 4.0–10.5)
WBC: 16.1 10*3/uL — ABNORMAL HIGH (ref 4.0–10.5)
nRBC: 0 % (ref 0.0–0.2)
nRBC: 0 % (ref 0.0–0.2)

## 2022-09-03 LAB — POTASSIUM: Potassium: 4.1 mmol/L (ref 3.5–5.1)

## 2022-09-03 LAB — MAGNESIUM: Magnesium: 1.8 mg/dL (ref 1.7–2.4)

## 2022-09-03 LAB — PHOSPHORUS: Phosphorus: 3.1 mg/dL (ref 2.5–4.6)

## 2022-09-03 MED ORDER — POTASSIUM CHLORIDE CRYS ER 20 MEQ PO TBCR
40.0000 meq | EXTENDED_RELEASE_TABLET | ORAL | Status: AC
  Administered 2022-09-03 (×2): 40 meq via ORAL
  Filled 2022-09-03 (×2): qty 2

## 2022-09-03 MED ORDER — POTASSIUM CHLORIDE 10 MEQ/100ML IV SOLN
10.0000 meq | INTRAVENOUS | Status: AC
  Administered 2022-09-03 (×2): 10 meq via INTRAVENOUS
  Filled 2022-09-03 (×2): qty 100

## 2022-09-03 NOTE — Progress Notes (Signed)
PROGRESS NOTE Kathleen Valenzuela  VZD:[id number] DOB: [birth year] DOA: 08/25/2022 PCP: Monico Blitz, MD   Brief Narrative/Hospital Course: 71 y.o. female with  chronic anemia, anxiety, bipolar 1 disorder, COPD, GERD, heart murmur, hyperlipidemia, hypertension, and more presented to ED with a chief complaint of altered mental status.  Unfortunately patient is not able to provide history due to her dementia.  Her son reported to staff that he had spoke to her and she was at her normal baseline.  Later her SNF called and reported that she was altered and coming to the ER.  He reports that she has had some episodes of emesis.  No further history could be obtained.  Son reports this is not patient's baseline.  In the ED they treated her for sepsis, identified the source is acute cholecystitis and used cefepime and Flagyl for antibiotics as patient has a penicillin allergy.  She received fluids.  With these treatments patient did start to improve.  Pt discovered after work up to have acute acalculous cholecystitis.  Dr. Arnoldo Morale evaluated patient and determined next best step is to have cholecystostomy tube placed.  IR requested patient transfer to [facility] to have tube placement, patient admitted under hospitalist service however on 08/30/2022, patient went into respiratory distress, placed on BiPAP which she could not tolerate, PCCM was consulted and she was transferred to [facility], she was fluid overloaded, she was diuresed in the ICU, she was weaned to room air and subsequently was transferred back under [place] on 09/01/2022   Briefly admitted w/ acute cholecystitis and had a drain placed in IR. Developed Respiratory Distress, unsure if it was aspiration pneumonia or pneumonia or volume overolad>Sent to ICU, placed on BiPAP and precedex. received diuresis. Now off BiPAP and off Precedex, on Nasal Canula. Tolerating well.  Medically stable and back to [facility] 12/28    Subjective: Seen this am Alert awake oriented to self,  place Some abdomen pain but no complaints otherwise On 3l [place]  No fever overnight, had temp spike on 12/28, leukocytosis persistent   Assessment and Plan: Principal Problem:   Acute cholecystitis Active Problems:   Bipolar disorder ([place])   High cholesterol   GERD (gastroesophageal reflux disease)   COPD (chronic obstructive pulmonary disease) ([place])   Essential hypertension   Diastolic dysfunction with heart failure (HCC)   Atrial fibrillation, chronic (HCC)   Dementia with behavioral disturbance (HCC)   Acute metabolic encephalopathy   Sepsis ([place])    Sepsis POA due to acute cholecystitis:: Reviewed CT abdomen, pelvis, HIDA scan and MRCP.  Seen by [facility]/ IR and transferred from a AP to [facility] cholecystostomy - completed on 10/28/2021.  Blood culture no growth urine culture insignificant growth.  On cefepime, Flagyl (abx started 12/21).  Still has persistent leukocytosis intermittent fever - but afebrile now.  Cont current iv antibiotics, check cbc in am  Previous hospitalist discussed with Dr. Arnoldo Morale via secure chat-patient can go home with the drain whenever medically stable, follow-up with Dr. Arnoldo Morale in 2 weeks (call office to schedule appointment), IR to follow drain and Eliquis was resumed after discussion.  Acute hypoxic failure due to volume overload/pulmonary edema/acute on chronic diastolic CHF: Still on 3l [place] and gets hypoxic when off oxygen.Initially needing BiPAP at this time doing well.  Continue supple oxygen, continue PT OT incentive spirometry.  Received IV Lasix 40 mg 12/28.  Reassess daily   Acute metabolic encephalopathy: Likely from sepsis with underlying dementia currently stable alert awake oriented x 2. Cont on delirium  precaution  Dementia with behavioral disturbances continue Namenda received  Acute on chronic normocytic anemia: Hemoglobin stable as below, trend Recent Labs    08/30/22 0350 08/31/22 0433 09/01/22 1256 09/02/22 0948 09/03/22 0301  HGB 11.1*  11.5* 10.4* 11.7* 10.9*  MCV 91.3 87.8 89.4 88.6 89.3   Thrombocytopenia resolved Chronic atrial fibrillation continue Eliquis and metoprolol. Cont to monitor in telemetry Hypokalemia repleted po and iv recheck this afternoon GERD:Continue PPI Bipolar disorder continue Latuda trazodone and Wellbutrin. Concern for aspiration : speech input appreciated diet is being advanced as below  DVT prophylaxis: SCDs Start: 08/26/22 0137 Code Status:   Code Status: Full Code Family Communication: plan of care discussed with patient at bedside. Son visited yesterday  Patient status is: Inpatient because of pending placement Level of care: Telemetry Medical   Dispo: The patient is from: home            Anticipated disposition: SNF likely next 24 hours Objective: Vitals last 24 hrs: Vitals:   09/02/22 2055 09/03/22 0419 09/03/22 0500 09/03/22 0726  BP: 137/64 130/60  131/60  Pulse: 77 75  72  Resp: '17 17  17  '$ Temp: 99.5 F (37.5 C) 98.9 F (37.2 C)  98.8 F (37.1 C)  TempSrc: Oral Axillary    SpO2: 97% 95%  96%  Weight:   78.6 kg   Height:       Weight change:   Physical Examination: General exam: AAox2, weak,older appearing HEENT:Oral mucosa moist, Ear/Nose WNL grossly, dentition normal. Respiratory system: bilaterally clear BS, no use of accessory muscle Cardiovascular system: S1 & S2 +, regular rate. Gastrointestinal system: Abdomen soft, cholecystostomy drain on right abdomen NT,ND,BS+ Nervous System:Alert, awake, moving extremities and grossly nonfocal Extremities: LE ankle edema beg, lower extremities warm Skin: No rashes,no icterus. MSK: Normal muscle bulk,tone, power   Medications reviewed:  Scheduled Meds:  apixaban  5 mg Oral BID   atorvastatin  40 mg Oral QHS   benztropine  0.5 mg Oral BID   buPROPion  150 mg Oral q morning   Chlorhexidine Gluconate Cloth  6 each Topical Daily   donepezil  10 mg Oral Q2000   feeding supplement  237 mL Oral BID BM   fluticasone  1  spray Each Nare Daily   fluticasone furoate-vilanterol  1 puff Inhalation Daily   gabapentin  300 mg Oral TID   isosorbide mononitrate  15 mg Oral Daily   lurasidone  80 mg Oral QHS   memantine  10 mg Oral BID   metoprolol succinate  12.5 mg Oral Daily   multivitamin with minerals  1 tablet Oral Daily   pantoprazole  40 mg Oral BID AC   potassium chloride  40 mEq Oral Q3H   saccharomyces boulardii  250 mg Oral BID   sodium chloride flush  5 mL Intracatheter Q8H   tiZANidine  2 mg Oral BID   traZODone  100 mg Oral QHS   venlafaxine XR  300 mg Oral Daily   Continuous Infusions:  ceFEPime (MAXIPIME) IV 2 g (09/03/22 0530)   metronidazole 500 mg (09/03/22 0110)   potassium chloride     Diet Order             DIET - DYS 1 Room service appropriate? Yes; Fluid consistency: Thin  Diet effective now                  Intake/Output Summary (Last 24 hours) at 09/03/2022 1042 Last data filed at 09/03/2022 0900 Gross  per 24 hour  Intake 870 ml  Output 57 ml  Net 813 ml    Net IO Since Admission: 3,929.4 mL [09/03/22 1042]  Wt Readings from Last 3 Encounters:  09/03/22 78.6 kg  04/18/22 81.6 kg  04/03/22 81.6 kg     Unresulted Labs (From admission, onward)     Start     Ordered   09/03/22 1300  Potassium  Once-Timed,   TIMED       Question:  Specimen collection method  Answer:  Lab=Lab collect   09/03/22 0900   09/03/22 0500  CBC  Daily,   R     Question:  Specimen collection method  Answer:  Lab=Lab collect   09/02/22 0922   09/03/22 0500  Phosphorus  Daily,   R     Question:  Specimen collection method  Answer:  Lab=Lab collect   09/02/22 1612   09/03/22 0500  Magnesium  Daily,   R     Question:  Specimen collection method  Answer:  Lab=Lab collect   09/02/22 1612          Data Reviewed: I have personally reviewed following labs and imaging studies CBC: Recent Labs  Lab 08/28/22 0238 08/29/22 0230 08/30/22 0350 08/31/22 0433 09/01/22 1256 09/02/22 0948  09/03/22 0301  WBC 13.6* 13.2* 16.5* 18.5* 18.1* 14.1* 16.1*  NEUTROABS 11.3* 10.4* 12.9* 14.3* 13.1*  --   --   HGB 9.7* 9.7* 11.1* 11.5* 10.4* 11.7* 10.9*  HCT 29.7* 30.8* 33.6* 33.0* 31.3* 35.0* 32.7*  MCV 92.5 94.8 91.3 87.8 89.4 88.6 89.3  PLT 142* 168 214 262 294 358 830    Basic Metabolic Panel: Recent Labs  Lab 08/29/22 0230 08/30/22 0350 08/31/22 0433 09/01/22 1256 09/03/22 0301  NA 143 142 141 138 139  K 3.3* 3.6 3.0* 3.5 2.9*  CL 116* 116* 110 112* 113*  CO2 19* 19* 21* 19* 21*  GLUCOSE 129* 95 133* 98 127*  BUN '14 10 13 11 8  '$ CREATININE 0.85 0.76 0.84 0.72 0.83  CALCIUM 8.2* 8.3* 8.3* 8.4* 8.6*  MG 2.1 1.9  --   --  1.8  PHOS  --   --   --   --  3.1    GFR: Estimated Creatinine Clearance: 68.1 mL/min (by C-G formula based on SCr of 0.83 mg/dL). Liver Function Tests: Recent Labs  Lab 08/28/22 0238 08/29/22 0230 08/30/22 0350 08/31/22 0433  AST '20 17 15 19  '$ ALT '14 13 11 13  '$ ALKPHOS 54 56 65 61  BILITOT 0.9 0.5 0.8 1.1  PROT 5.5* 5.5* 6.1* 6.4*  ALBUMIN 2.1* 2.1* 2.3* 2.3*     Recent Results (from the past 240 hour(s))  Blood Culture (routine x 2)     Status: None   Collection Time: 08/25/22  8:12 PM   Specimen: BLOOD  Result Value Ref Range Status   Specimen Description BLOOD BLOOD RIGHT [place]  Final   Special Requests   Final    BOTTLES DRAWN AEROBIC AND ANAEROBIC Blood Culture adequate volume   Culture   Final    NO GROWTH 5 DAYS Performed at [facility], [address] [place], [place] [address]    Report Status 08/30/2022 FINAL  Final  Blood Culture (routine x 2)     Status: None   Collection Time: 08/25/22  8:18 PM   Specimen: BLOOD  Result Value Ref Range Status   Specimen Description BLOOD BLOOD LEFT ARM  Final   Special Requests  Final    BOTTLES DRAWN AEROBIC AND ANAEROBIC Blood Culture adequate volume   Culture   Final    NO GROWTH 5 DAYS Performed at [facility], [address] [place], [place] [address]    Report Status  08/30/2022 FINAL  Final  Resp panel by RT-PCR (RSV, Flu A&B, Covid) Anterior Nasal Swab     Status: None   Collection Time: 08/25/22  8:33 PM   Specimen: Anterior Nasal Swab  Result Value Ref Range Status   SARS Coronavirus 2 by RT PCR NEGATIVE NEGATIVE Final    Comment: (NOTE) SARS-CoV-2 target nucleic acids are NOT DETECTED.  The SARS-CoV-2 RNA is generally detectable in upper respiratory specimens during the acute phase of infection. The lowest concentration of SARS-CoV-2 viral copies this assay can detect is 138 copies/mL. A negative result does not preclude SARS-Cov-2 infection and should not be used as the sole basis for treatment or other patient management decisions. A negative result may occur with  improper specimen collection/handling, submission of specimen other than nasopharyngeal swab, presence of viral mutation(s) within the areas targeted by this assay, and inadequate number of viral copies(<138 copies/mL). A negative result must be combined with clinical observations, patient history, and epidemiological information. The expected result is Negative.  Fact Sheet for Patients:  EntrepreneurPulse.com.au  Fact Sheet for Healthcare Providers:  IncredibleEmployment.be  This test is no t yet approved or cleared by the [place] FDA and  has been authorized for detection and/or diagnosis of SARS-CoV-2 by FDA under an Emergency Use Authorization (EUA). This EUA will remain  in effect (meaning this test can be used) for the duration of the COVID-19 declaration under Section 564(b)(1) of the Act, 21 U.S.C.section 360bbb-3(b)(1), unless the authorization is terminated  or revoked sooner.       Influenza A by PCR NEGATIVE NEGATIVE Final   Influenza B by PCR NEGATIVE NEGATIVE Final    Comment: (NOTE) The Xpert Xpress SARS-CoV-2/FLU/RSV plus assay is intended as an aid in the diagnosis of influenza from Nasopharyngeal swab specimens  and should not be used as a sole basis for treatment. Nasal washings and aspirates are unacceptable for Xpert Xpress SARS-CoV-2/FLU/RSV testing.  Fact Sheet for Patients: EntrepreneurPulse.com.au  Fact Sheet for Healthcare Providers: IncredibleEmployment.be  This test is not yet approved or cleared by the [place] FDA and has been authorized for detection and/or diagnosis of SARS-CoV-2 by FDA under an Emergency Use Authorization (EUA). This EUA will remain in effect (meaning this test can be used) for the duration of the COVID-19 declaration under Section 564(b)(1) of the Act, 21 U.S.C. section 360bbb-3(b)(1), unless the authorization is terminated or revoked.     Resp Syncytial Virus by PCR NEGATIVE NEGATIVE Final    Comment: (NOTE) Fact Sheet for Patients: EntrepreneurPulse.com.au  Fact Sheet for Healthcare Providers: IncredibleEmployment.be  This test is not yet approved or cleared by the [place] FDA and has been authorized for detection and/or diagnosis of SARS-CoV-2 by FDA under an Emergency Use Authorization (EUA). This EUA will remain in effect (meaning this test can be used) for the duration of the COVID-19 declaration under Section 564(b)(1) of the Act, 21 U.S.C. section 360bbb-3(b)(1), unless the authorization is terminated or revoked.  Performed at [facility], [address], [place], [place] [address]   Urine Culture     Status: Abnormal   Collection Time: 08/25/22  9:24 PM   Specimen: Urine, Clean Catch  Result Value Ref Range Status   Specimen Description  Final    URINE, CLEAN CATCH Performed at [facility], [address], [place], [place] [address]    Special Requests   Final    NONE Performed at [facility], [address] [place], [place] [address]    Culture (A)  Final    <10,000 COLONIES/mL INSIGNIFICANT GROWTH Performed at [place] [address] [place], [place] [address]    Report Status 08/27/2022 FINAL  Final  MRSA Next Gen by PCR, Nasal     Status: None   Collection Time: 08/30/22  1:29 PM   Specimen: Nasal Mucosa; Nasal Swab  Result Value Ref Range Status   MRSA by PCR Next Gen NOT DETECTED NOT DETECTED Final    Comment: (NOTE) The GeneXpert MRSA Assay (FDA approved for NASAL specimens only), is one component of a comprehensive MRSA colonization surveillance program. It is not intended to diagnose MRSA infection nor to guide or monitor treatment for MRSA infections. Test performance is not FDA approved in patients less than 41 years old. Performed at [facility], [place] [address] [place], [place] [address]     Antimicrobials: Anti-infectives (From admission, onward)    Start     Dose/Rate Route Frequency Ordered Stop   08/29/22 1400  ceFEPIme (MAXIPIME) 2 g in sodium chloride 0.9 % 100 mL IVPB        2 g 200 mL/hr over 30 Minutes Intravenous Every 8 hours 08/29/22 0750     08/26/22 1800  ceFEPIme (MAXIPIME) 2 g in sodium chloride 0.9 % 100 mL IVPB  Status:  Discontinued        2 g 200 mL/hr over 30 Minutes Intravenous Every 12 hours 08/26/22 0756 08/29/22 0750   08/26/22 1200  metroNIDAZOLE (FLAGYL) IVPB 500 mg        500 mg 100 mL/hr over 60 Minutes Intravenous Every 12 hours 08/26/22 0136     08/26/22 0600  ceFEPIme (MAXIPIME) 2 g in sodium chloride 0.9 % 100 mL IVPB  Status:  Discontinued        2 g 200 mL/hr over 30 Minutes Intravenous Every 8 hours 08/26/22 0013 08/26/22 0756   08/25/22 2300  ceFEPIme (MAXIPIME) 2 g in sodium chloride 0.9 % 100 mL IVPB       See Hyperspace for full Linked Orders Report.   2 g 200 mL/hr over 30 Minutes Intravenous  Once 08/25/22 2252 08/26/22 0030   08/25/22 2300  metroNIDAZOLE (FLAGYL) IVPB 500 mg       See Hyperspace for full Linked Orders Report.   500 mg 100 mL/hr over 60 Minutes Intravenous  Once 08/25/22 2252 08/26/22 0136      Culture/Microbiology     Component Value Date/Time   SDES  08/25/2022 2124    URINE, CLEAN CATCH Performed at [facility], [address], [place],  [address]    [facility]  08/25/2022 2124    NONE Performed at [facility], [address], [place],  [address]    CULT (A) 08/25/2022 2124    <10,000 COLONIES/mL INSIGNIFICANT GROWTH Performed at [place] [address], [place],  [address]    REPTSTATUS 08/27/2022 FINAL 08/25/2022 2124    Radiology Studies: No results found.   LOS: 9 days   Antonieta Pert, MD [facility]  09/03/2022, 10:42 AM

## 2022-09-03 NOTE — Plan of Care (Signed)
°  Problem: Coping: °Goal: Level of anxiety will decrease °Outcome: Progressing °  °

## 2022-09-04 ENCOUNTER — Inpatient Hospital Stay (HOSPITAL_COMMUNITY): Payer: Medicare Other

## 2022-09-04 ENCOUNTER — Other Ambulatory Visit: Payer: Self-pay | Admitting: Radiology

## 2022-09-04 DIAGNOSIS — K810 Acute cholecystitis: Secondary | ICD-10-CM

## 2022-09-04 LAB — BASIC METABOLIC PANEL
Anion gap: 10 (ref 5–15)
BUN: 7 mg/dL — ABNORMAL LOW (ref 8–23)
CO2: 20 mmol/L — ABNORMAL LOW (ref 22–32)
Calcium: 9 mg/dL (ref 8.9–10.3)
Chloride: 108 mmol/L (ref 98–111)
Creatinine, Ser: 0.77 mg/dL (ref 0.44–1.00)
GFR, Estimated: >60 mL/min (ref >60)
Glucose, Bld: 126 mg/dL — ABNORMAL HIGH (ref 70–99)
Potassium: 3.7 mmol/L (ref 3.5–5.1)
Sodium: 138 mmol/L (ref 135–145)

## 2022-09-04 LAB — CBC
HCT: 34.5 % — ABNORMAL LOW (ref 36.0–46.0)
Hemoglobin: 11.1 g/dL — ABNORMAL LOW (ref 12.0–15.0)
MCH: 29.4 pg (ref 26.0–34.0)
MCHC: 32.2 g/dL (ref 30.0–36.0)
MCV: 91.3 fL (ref 80.0–100.0)
Platelets: 416 10*3/uL — ABNORMAL HIGH (ref 150–400)
RBC: 3.78 MIL/uL — ABNORMAL LOW (ref 3.87–5.11)
RDW: 15.7 % — ABNORMAL HIGH (ref 11.5–15.5)
WBC: 19.8 10*3/uL — ABNORMAL HIGH (ref 4.0–10.5)
nRBC: 0 % (ref 0.0–0.2)

## 2022-09-04 LAB — HEPATIC FUNCTION PANEL
ALT: 13 U/L (ref 0–44)
AST: 25 U/L (ref 15–41)
Albumin: 2.3 g/dL — ABNORMAL LOW (ref 3.5–5.0)
Alkaline Phosphatase: 60 U/L (ref 38–126)
Bilirubin, Direct: <0.1 mg/dL (ref 0.0–0.2)
Total Bilirubin: 0.1 mg/dL — ABNORMAL LOW (ref 0.3–1.2)
Total Protein: 6.4 g/dL — ABNORMAL LOW (ref 6.5–8.1)

## 2022-09-04 LAB — GLUCOSE, CAPILLARY
Glucose-Capillary: 122 mg/dL — ABNORMAL HIGH (ref 70–99)
Glucose-Capillary: 120 mg/dL — ABNORMAL HIGH (ref 70–99)
Glucose-Capillary: 113 mg/dL — ABNORMAL HIGH (ref 70–99)
Glucose-Capillary: 115 mg/dL — ABNORMAL HIGH (ref 70–99)
Glucose-Capillary: 106 mg/dL — ABNORMAL HIGH (ref 70–99)

## 2022-09-04 LAB — LIPASE, BLOOD: Lipase: 44 U/L (ref 11–51)

## 2022-09-04 MED ORDER — SODIUM CHLORIDE 0.9 % IV SOLN: INTRAVENOUS | Status: DC

## 2022-09-04 MED ORDER — IOHEXOL 350 MG/ML SOLN
75.0000 mL | Freq: Once | INTRAVENOUS | Status: AC | PRN
  Administered 2022-09-04: 75 mL via INTRAVENOUS

## 2022-09-04 MED ORDER — IOHEXOL 9 MG/ML PO SOLN
500.0000 mL | ORAL | Status: AC
  Administered 2022-09-04 (×2): 500 mL via ORAL

## 2022-09-04 NOTE — Progress Notes (Signed)
PROGRESS NOTE Kathleen Valenzuela  QQI:[id number] DOB: 10/25/1950 DOA: 08/25/2022 PCP: Monico Blitz, MD   Brief Narrative/Hospital Course: 71 y.o. female with  chronic anemia, anxiety, bipolar 1 disorder, COPD, GERD, heart murmur, hyperlipidemia, hypertension, and more presented to ED with a chief complaint of altered mental status.  Unfortunately patient is not able to provide history due to her dementia.  Her son reported to staff that he had spoke to her and she was at her normal baseline.  Later her SNF called and reported that she was altered and coming to the ER.  He reports that she has had some episodes of emesis.  No further history could be obtained.  Son reports this is not patient's baseline.  In the ED they treated her for sepsis, identified the source is acute cholecystitis and used cefepime and Flagyl for antibiotics as patient has a penicillin allergy.  She received fluids.  With these treatments patient did start to improve.  Pt discovered after work up to have acute acalculous cholecystitis.  Dr. Arnoldo Morale evaluated patient and determined next best step is to have cholecystostomy tube placed.  IR requested patient transfer to [facility] to have tube placement, patient admitted under hospitalist service however on 08/30/2022, patient went into respiratory distress, placed on BiPAP which she could not tolerate, PCCM was consulted and she was transferred to [facility], she was fluid overloaded, she was diuresed in the ICU, she was weaned to room air and subsequently was transferred back under [place] on 09/01/2022   Briefly admitted w/ acute cholecystitis and had a drain placed in IR. Developed Respiratory Distress, unsure if it was aspiration pneumonia or pneumonia or volume overolad>Sent to ICU, placed on BiPAP and precedex. received diuresis. Now off BiPAP and off Precedex, on Nasal Canula. Tolerating well.  Medically stable and back to [facility] 12/28    Subjective: Seen and examined Has no complaints Some abd  discomfort on rt side Aaox2 Not in distress Overnight wbc trending up but afebrile   Assessment and Plan: Principal Problem:   Acute cholecystitis Active Problems:   Bipolar disorder (HCC)   High cholesterol   GERD (gastroesophageal reflux disease)   COPD (chronic obstructive pulmonary disease) (HCC)   Essential hypertension   Diastolic dysfunction with heart failure (HCC)   Atrial fibrillation, chronic (HCC)   Dementia with behavioral disturbance (HCC)   Acute metabolic encephalopathy   Sepsis ([place])    Sepsis POA due to acute cholecystitis:: Had extensive workup with CT abdomen/pelvis, HIDA scan and MRCP.  Seen by [facility]/ IR and transferred from a [facility] to [facility] cholecystostomy - done on 08/27/2022.  Blood culture no growth urine culture insignificant growth.  On cefepime, Flagyl (abx started 12/21).  Still has persistent leukocytosis but no fever.Cont current iv antibiotics.  WBC trending up checking lipase, LFTs and ultrasound right upper quadrant, trend CBC. Previous hospitalist discussed with Dr. Arnoldo Morale via secure chat-patient can go home with the drain whenever medically stable, follow-up with Dr. Arnoldo Morale in 2 weeks (call office to schedule appointment), IR to follow drain and Eliquis was resumed after discussion. Recent Labs  Lab 09/01/22 1256 09/02/22 0948 09/03/22 0301 09/03/22 1809 09/04/22 0240  WBC 18.1* 14.1* 16.1* 16.1* 19.8*     Acute hypoxic failure due to volume overload/pulmonary edema/acute on chronic diastolic CHF: Still on 3l [place] and gets hypoxic when off oxygen.Initially needing BiPAP at this time doing well.  Continue supple oxygen, continue PT OT incentive spirometry.  Received IV Lasix 40 mg 12/28.  Reassess  daily   Acute metabolic encephalopathy: Likely from sepsis with underlying dementia currently stable alert awake oriented x 2. Cont on delirium precaution Dementia with behavioral disturbances Bipolar disorder: Mood stable this is alert awake oriented x 2.  Continue Namenda/Aricept, Cogentin, Wellbutrin trazodone Effexor.  Continue delirium precaution supportive care fall precaution  Acute on chronic normocytic anemia: Hemoglobin stable > trend Recent Labs    09/01/22 1256 09/02/22 0948 09/03/22 0301 09/03/22 1809 09/04/22 0240  HGB 10.4* 11.7* 10.9* 11.6* 11.1*  MCV 89.4 88.6 89.3 91.0 91.3   Thrombocytopenia resolved Chronic atrial fibrillation continue Eliquis and metoprolol. Cont to monitor in telemetry Hypokalemia resolved GERD:Continue PPI Concern for aspiration : speech input appreciated diet changed to dysphagia 1   DVT prophylaxis: SCDs Start: 08/26/22 0137 Code Status:   Code Status: Full Code Family Communication: plan of care discussed with patient at bedside. Son visited previously [facility] but no answer  Patient status is: Inpatient because of pending placement Level of care: Telemetry Medical   Dispo: The patient is from: home            Anticipated disposition: SNF once wbc better Objective: Vitals last 24 hrs: Vitals:   09/03/22 1501 09/03/22 1937 09/04/22 0400 09/04/22 0752  BP: 135/62 (!) 136/56 139/64 133/70  Pulse: 78 85 83 78  Resp: '16 20 18 18  '$ Temp:  98.9 F (37.2 C) 98.5 F (36.9 C) 98.9 F (37.2 C)  TempSrc:  Oral Oral Oral  SpO2: 95% 96% 97% 100%  Weight:      Height:       Weight change:   Physical Examination: General exam: Aaox2 pleasant, conversant, weak,older appearing HEENT:Oral mucosa moist, Ear/Nose WNL grossly, dentition normal. Respiratory system: bilaterally diminished BS, no use of accessory muscle Cardiovascular system: S1 & S2 +, regular rate. Gastrointestinal system: Abdomen soft, mildly tender rt side,ND,BS+, rt abd w/ drain-smaal amout of serosanguinous output Nervous System:Alert, awake, moving extremities and grossly nonfocal Extremities: LE ankle edema neg, lower extremities warm Skin: No rashes,no icterus. MSK: Normal muscle bulk,tone, power   Medications  reviewed:  Scheduled Meds:  apixaban  5 mg Oral BID   atorvastatin  40 mg Oral QHS   benztropine  0.5 mg Oral BID   buPROPion  150 mg Oral q morning   Chlorhexidine Gluconate Cloth  6 each Topical Daily   donepezil  10 mg Oral Q2000   feeding supplement  237 mL Oral BID BM   fluticasone  1 spray Each Nare Daily   fluticasone furoate-vilanterol  1 puff Inhalation Daily   gabapentin  300 mg Oral TID   isosorbide mononitrate  15 mg Oral Daily   lurasidone  80 mg Oral QHS   memantine  10 mg Oral BID   metoprolol succinate  12.5 mg Oral Daily   multivitamin with minerals  1 tablet Oral Daily   pantoprazole  40 mg Oral BID AC   sodium chloride flush  5 mL Intracatheter Q8H   tiZANidine  2 mg Oral BID   traZODone  100 mg Oral QHS   venlafaxine XR  300 mg Oral Daily   Continuous Infusions:  ceFEPime (MAXIPIME) IV 2 g (09/04/22 0513)   metronidazole 500 mg (09/04/22 0017)   Diet Order             DIET - DYS 1 Room service appropriate? Yes; Fluid consistency: Thin  Diet effective now  Intake/Output Summary (Last 24 hours) at 09/04/2022 0959 Last data filed at 09/04/2022 1610 Gross per 24 hour  Intake 135 ml  Output 1225 ml  Net -1090 ml    Net IO Since Admission: 2,839.4 mL [09/04/22 0959]  Wt Readings from Last 3 Encounters:  09/03/22 78.6 kg  04/18/22 81.6 kg  04/03/22 81.6 kg     Unresulted Labs (From admission, onward)     Start     Ordered   09/04/22 0926  Hepatic function panel  Add-on,   AD       Question:  Specimen collection method  Answer:  Lab=Lab collect   09/04/22 0925   09/04/22 0926  Lipase, blood  Add-on,   AD       Question:  Specimen collection method  Answer:  Lab=Lab collect   09/04/22 0925   09/03/22 0500  CBC  Daily,   R     Question:  Specimen collection method  Answer:  Lab=Lab collect   09/02/22 9604          Data Reviewed: I have personally reviewed following labs and imaging studies CBC: Recent Labs  Lab  08/29/22 0230 08/30/22 0350 08/31/22 0433 09/01/22 1256 09/02/22 0948 09/03/22 0301 09/03/22 1809 09/04/22 0240  WBC 13.2* 16.5* 18.5* 18.1* 14.1* 16.1* 16.1* 19.8*  NEUTROABS 10.4* 12.9* 14.3* 13.1*  --   --   --   --   HGB 9.7* 11.1* 11.5* 10.4* 11.7* 10.9* 11.6* 11.1*  HCT 30.8* 33.6* 33.0* 31.3* 35.0* 32.7* 34.5* 34.5*  MCV 94.8 91.3 87.8 89.4 88.6 89.3 91.0 91.3  PLT 168 214 262 294 358 389 399 416*    Basic Metabolic Panel: Recent Labs  Lab 08/29/22 0230 08/30/22 0350 08/31/22 0433 09/01/22 1256 09/03/22 0301 09/03/22 1528 09/03/22 1809 09/04/22 0240  NA 143 142 141 138 139  --  136 138  K 3.3* 3.6 3.0* 3.5 2.9* 4.1 3.9 3.7  CL 116* 116* 110 112* 113*  --  111 108  CO2 19* 19* 21* 19* 21*  --  20* 20*  GLUCOSE 129* 95 133* 98 127*  --  127* 126*  BUN '14 10 13 11 8  '$ --  7* 7*  CREATININE 0.85 0.76 0.84 0.72 0.83  --  0.77 0.77  CALCIUM 8.2* 8.3* 8.3* 8.4* 8.6*  --  8.8* 9.0  MG 2.1 1.9  --   --  1.8  --   --   --   PHOS  --   --   --   --  3.1  --   --   --     GFR: Estimated Creatinine Clearance: 70.7 mL/min (by C-G formula based on SCr of 0.77 mg/dL). Liver Function Tests: Recent Labs  Lab 08/29/22 0230 08/30/22 0350 08/31/22 0433  AST '17 15 19  '$ ALT '13 11 13  '$ ALKPHOS 56 65 61  BILITOT 0.5 0.8 1.1  PROT 5.5* 6.1* 6.4*  ALBUMIN 2.1* 2.3* 2.3*   Recent Labs  Lab 09/03/22 1143 09/03/22 2041 09/04/22 0110 09/04/22 0519 09/04/22 0756  GLUCAP 136* 121* 122* 120* 113*    Recent Results (from the past 240 hour(s))  Blood Culture (routine x 2)     Status: None   Collection Time: 08/25/22  8:12 PM   Specimen: BLOOD  Result Value Ref Range Status   Specimen Description BLOOD BLOOD RIGHT [place]  Final   Special Requests   Final    BOTTLES DRAWN AEROBIC AND ANAEROBIC Blood Culture adequate volume  Culture   Final    NO GROWTH 5 DAYS Performed at [facility], [address], [place], [place] [address]    Report Status 08/30/2022 FINAL  Final  Blood  Culture (routine x 2)     Status: None   Collection Time: 08/25/22  8:18 PM   Specimen: BLOOD  Result Value Ref Range Status   Specimen Description BLOOD BLOOD LEFT ARM  Final   Special Requests   Final    BOTTLES DRAWN AEROBIC AND ANAEROBIC Blood Culture adequate volume   Culture   Final    NO GROWTH 5 DAYS Performed at [facility], [address], [place], [place] [address]    Report Status 08/30/2022 FINAL  Final  Resp panel by RT-PCR (RSV, Flu A&B, Covid) Anterior Nasal Swab     Status: None   Collection Time: 08/25/22  8:33 PM   Specimen: Anterior Nasal Swab  Result Value Ref Range Status   SARS Coronavirus 2 by RT PCR NEGATIVE NEGATIVE Final    Comment: (NOTE) SARS-CoV-2 target nucleic acids are NOT DETECTED.  The SARS-CoV-2 RNA is generally detectable in upper respiratory specimens during the acute phase of infection. The lowest concentration of SARS-CoV-2 viral copies this assay can detect is 138 copies/mL. A negative result does not preclude SARS-Cov-2 infection and should not be used as the sole basis for treatment or other patient management decisions. A negative result may occur with  improper specimen collection/handling, submission of specimen other than nasopharyngeal swab, presence of viral mutation(s) within the areas targeted by this assay, and inadequate number of viral copies(<138 copies/mL). A negative result must be combined with clinical observations, patient history, and epidemiological information. The expected result is Negative.  Fact Sheet for Patients:  EntrepreneurPulse.com.au  Fact Sheet for Healthcare Providers:  IncredibleEmployment.be  This test is no t yet approved or cleared by the [place] FDA and  has been authorized for detection and/or diagnosis of SARS-CoV-2 by FDA under an Emergency Use Authorization (EUA). This EUA will remain  in effect (meaning this test can be used) for the duration of  the COVID-19 declaration under Section 564(b)(1) of the Act, 21 U.S.C.section 360bbb-3(b)(1), unless the authorization is terminated  or revoked sooner.       Influenza A by PCR NEGATIVE NEGATIVE Final   Influenza B by PCR NEGATIVE NEGATIVE Final    Comment: (NOTE) The Xpert Xpress SARS-CoV-2/FLU/RSV plus assay is intended as an aid in the diagnosis of influenza from Nasopharyngeal swab specimens and should not be used as a sole basis for treatment. Nasal washings and aspirates are unacceptable for Xpert Xpress SARS-CoV-2/FLU/RSV testing.  Fact Sheet for Patients: EntrepreneurPulse.com.au  Fact Sheet for Healthcare Providers: IncredibleEmployment.be  This test is not yet approved or cleared by the [place] FDA and has been authorized for detection and/or diagnosis of SARS-CoV-2 by FDA under an Emergency Use Authorization (EUA). This EUA will remain in effect (meaning this test can be used) for the duration of the COVID-19 declaration under Section 564(b)(1) of the Act, 21 U.S.C. section 360bbb-3(b)(1), unless the authorization is terminated or revoked.     Resp Syncytial Virus by PCR NEGATIVE NEGATIVE Final    Comment: (NOTE) Fact Sheet for Patients: EntrepreneurPulse.com.au  Fact Sheet for Healthcare Providers: IncredibleEmployment.be  This test is not yet approved or cleared by the [place] FDA and has been authorized for detection and/or diagnosis of SARS-CoV-2 by FDA under an Emergency Use Authorization (EUA). This EUA will remain in effect (meaning  this test can be used) for the duration of the COVID-19 declaration under Section 564(b)(1) of the Act, 21 U.S.C. section 360bbb-3(b)(1), unless the authorization is terminated or revoked.  Performed at [facility], [address], [place], [place] [address]   Urine Culture     Status: Abnormal   Collection Time: 08/25/22  9:24 PM    Specimen: Urine, Clean Catch  Result Value Ref Range Status   Specimen Description   Final    URINE, CLEAN CATCH Performed at [facility], [address], [place], [place] [address]    Special Requests   Final    NONE Performed at [facility], [address], [place], [place] [address]    Culture (A)  Final    <10,000 COLONIES/mL INSIGNIFICANT GROWTH Performed at [facility], [place] [address] [place], [place] [address]    Report Status 08/27/2022 FINAL  Final  MRSA Next Gen by PCR, Nasal     Status: None   Collection Time: 08/30/22  1:29 PM   Specimen: Nasal Mucosa; Nasal Swab  Result Value Ref Range Status   MRSA by PCR Next Gen NOT DETECTED NOT DETECTED Final    Comment: (NOTE) The GeneXpert MRSA Assay (FDA approved for NASAL specimens only), is one component of a comprehensive MRSA colonization surveillance program. It is not intended to diagnose MRSA infection nor to guide or monitor treatment for MRSA infections. Test performance is not FDA approved in patients less than 74 years old. Performed at [facility], [place] [address], [place], [place] [address]     Antimicrobials: Anti-infectives (From admission, onward)    Start     Dose/Rate Route Frequency Ordered Stop   08/29/22 1400  ceFEPIme (MAXIPIME) 2 g in sodium chloride 0.9 % 100 mL IVPB        2 g 200 mL/hr over 30 Minutes Intravenous Every 8 hours 08/29/22 0750     08/26/22 1800  ceFEPIme (MAXIPIME) 2 g in sodium chloride 0.9 % 100 mL IVPB  Status:  Discontinued        2 g 200 mL/hr over 30 Minutes Intravenous Every 12 hours 08/26/22 0756 08/29/22 0750   08/26/22 1200  metroNIDAZOLE (FLAGYL) IVPB 500 mg        500 mg 100 mL/hr over 60 Minutes Intravenous Every 12 hours 08/26/22 0136     08/26/22 0600  ceFEPIme (MAXIPIME) 2 g in sodium chloride 0.9 % 100 mL IVPB  Status:  Discontinued        2 g 200 mL/hr over 30 Minutes Intravenous Every 8 hours 08/26/22 0013 08/26/22 0756   08/25/22 2300  ceFEPIme  (MAXIPIME) 2 g in sodium chloride 0.9 % 100 mL IVPB       See Hyperspace for full Linked Orders Report.   2 g 200 mL/hr over 30 Minutes Intravenous  Once 08/25/22 2252 08/26/22 0030   08/25/22 2300  metroNIDAZOLE (FLAGYL) IVPB 500 mg       See Hyperspace for full Linked Orders Report.   500 mg 100 mL/hr over 60 Minutes Intravenous  Once 08/25/22 2252 08/26/22 0136      Culture/Microbiology    Component Value Date/Time   SDES  08/25/2022 2124    URINE, CLEAN CATCH Performed at [facility], [address], [place], [place] [address]    [facility]  08/25/2022 2124    NONE Performed at [facility], [address] [place], [place] [address]    CULT (A) 08/25/2022 2124    <  10,000 COLONIES/mL INSIGNIFICANT GROWTH Performed at [facility], [place] [address] [place],  [address]    REPTSTATUS 08/27/2022 FINAL 08/25/2022 2124    Radiology Studies: No results found.   LOS: 10 days   Antonieta Pert, MD [facility]  09/04/2022, 9:59 AM

## 2022-09-04 NOTE — Plan of Care (Signed)

## 2022-09-04 NOTE — Consult Note (Signed)
Referring Provider: Dr. Antonieta Pert Primary Care Physician:  Monico Blitz, MD Primary Gastroenterologist:  Althia Forts, previously Dr. Laural Golden   Reason for Consultation:  RUQ/RLQ abdominal pan  HPI: Kathleen Valenzuela is a 71 y.o. female with a past medical history of anxiety, bipolar disorder, arthritis, hypertension, hypercholesterolemia, diastolic CHF, aortic stenosis, atrial fibrillation on Eliquis, COPD, dementia, GERD, gastric ulcers and duodenal stenosis.  She presented to [facility], ED 08/25/2022 due to having altered mental status, fever and vomiting.  Labs in the ED showed a WBC count of 22.9. Lactic acid level 2.8.  Repeat lactic acid level 2.5 after aggressive IV fluid resuscitation. CTAP findings concerning for acute cholecystitis with intra and extrahepatic biliary ductal dilatation and questionable wall thickening to the sigmoid colon. RUQ sonogram 08/26/2022 with a distended gallbladder with diffuse gallbladder wall thickening and moderate sludge with a positive sonographic Murphy sign and a dilated CBD duct measuring 10 mm. An abdominal MRI/MRCP 08/26/2022 showed a distended gallbladder with sludge and diffuse wall thickening with pericholecystic edema without gallstones, findings compatible with acute cholecystitis. Mild diffuse intrahepatic biliary ductal dilatation with a dilated CBD duct 13 mm with a possible stone in the lower third of the CBD, mild diffuse hepatic steatosis, tiny hiatal hernia and moderate diffuse colonic diverticulosis. HIDA scan 08/26/2022 showed nonvisualization of the gallbladder consistent with acute acalculous cholecystitis. Normal LFTs and lipase levels. General surgeon Dr. Arnoldo Morale was consulted who recommended admission to [facility] for further workup for possible cholecystitis with sepsis. Due to her multiple comorbidities, a percutaneous cholecystostomy tube to relieve her acute cholecystitis was place per IR 08/27/2022 with plans for laparoscopic  cholecystectomy when her clinical status stabilized.  She went into acute respiratory distress 08/30/2022 due to being fluid overloaded which resolved after she went on BiPAP with Precedex and was diuresed in the ICU then subsequently transferred back to the medical floor 09/01/2022.  Blood and urine cultures were negative.  She continues to have persistent leukocytosis without fever.  She was noted to have sided abdominal pain for the past few days and a GI consult was requested for further evaluation.  RUQ sonogram today showed CBD dilatation measuring 12 mm.  Labs today showed a WBC count 19.8 up from 16.1.  Hemoglobin 11.1.  Platelet 416.  BUN 7.  Creatinine 0.77.  Total bili 0.1. Alk phos 60.  AST 25.  ALT 13.  Lipase 44.  There was concern for possible aspiration pneumonia therefore a swallow study by speech pathologist was 09/02/2022 which showed no evidence of aspiration and a pured diet was recommended and later changed to dysphagia 1 diet.  She denies having dysphagia or heartburn.  No nausea or vomiting.  Her RN stated she easily swallows her medications and food but eats only a few spoonfuls at a time.  She passed 2 soft brown stools yesterday and 1 similar bowel movement late last night.  No rectal bleeding or black stools.  She has a history of GERD, previously followed by gastroenterologist Dr. Dorien Chihuahua in [place].  She underwent an EGD 520 02/2016 which identified a benign-appearing esophageal stenosis which was dilated and an acquired duodenal stenosis which was dilated, likely due to peptic disease.  EGD 01/06/2017 identified an esophageal stenosis which was dilated, a nonbleeding gastric ulcer and gastritis.  She was prescribed PPI twice daily.  A repeat EGD 02/17/2017 showed grade a reflux esophagitis, a 2 cm hiatal hernia, esophageal stenosis which was dilated and a gastric ulcer and acquired duodenal stenosis  at the angle of the duodenum.  Dr. Collene Mares documented her gastric ulcer did not heal  she was not compliant with taking PPI as prescribed.  It is unclear if she is ever undergone a screening colonoscopy.  She is on Eliquis for history of A-fib.  No family at the bedside at this time.  IMAGE STUDIES:  HIDA scan 08/26/2022: Normal hepatocellular function with patent CBD.  Nonvisualization of the gallbladder despite morphine consistent with acute cholecystitis, acalculous by ultrasound and MR.  Abdominal MRI/MRCP 08/26/2022: 1. Distended sludge filled gallbladder with mild diffuse wall thickening and pericholecystic edema. No gallstones in the gallbladder. Findings are compatible with acute acalculous cholecystitis in the correct clinical setting. 2. Mild diffuse central intrahepatic biliary ductal dilatation. Dilated common bile duct (13 mm diameter) with solitary 4 mm intermediate T2 signal intensity filling defect in the lower third of the CBD, compatible with either a stone or sludge. No enhancing biliary masses. 3. Mild diffuse hepatic steatosis. 4. Tiny hiatal hernia. 5. Moderate diffuse colonic diverticulosis. 6. Asymmetric mild fullness of the right renal collecting system without overt right hydronephrosis, not appreciably changed from 08/25/2022 CT. 7. Small Bosniak category 1 and category 2 renal cysts. No suspicious renal masses.  RUQ sonogram 08/26/2022: IMPRESSION: 1. Distended gallbladder with mild diffuse gallbladder wall thickening and moderate sludge. Sonographic Murphy sign present. No shadowing gallstones. No pericholecystic fluid. Findings are equivocal for acute acalculus cholecystitis. Consider hepatobiliary scintigraphy for further evaluation as clinically warranted. 2. Dilated common bile duct (10 mm diameter). Correlate with serum bilirubin levels. MRI abdomen with MRCP without and with IV contrast may be considered for further evaluation as clinically warranted. 3. Liver within normal limits.   Chest/abdominal/pelvic CT with contrast  08/25/2022:  CT CHEST FINDINGS   Cardiovascular: No significant vascular findings. Normal heart size. No pericardial effusion. There are atherosclerotic calcifications of the aorta.   Mediastinum/Nodes: No enlarged mediastinal, hilar, or axillary lymph nodes. Thyroid gland, trachea, and esophagus demonstrate no significant findings.   Lungs/Pleura: Patchy nodular airspace opacity seen in the right upper lobe centrally image 3/50 measuring 6 mm. There is a 5 mm nodule in the left lower lobe image 3/40. There is minimal atelectasis or scarring in the right lung base. The lungs are otherwise clear. There is no pleural effusion or pneumothorax.   Musculoskeletal: No chest wall mass or suspicious bone lesions identified.   CT ABDOMEN PELVIS FINDINGS   Hepatobiliary: The gallbladder is dilated and there is gallbladder wall thickening. There is intra and extrahepatic biliary ductal dilatation. The common bile duct measures 15 mm. There is mild inflammatory stranding surrounding the gallbladder. No focal liver lesions are seen.   Pancreas: Unremarkable. No pancreatic ductal dilatation or surrounding inflammatory changes.   Spleen: There is a 12 mm splenic cyst or hemangioma which is unchanged. The spleen is nonenlarged.   Adrenals/Urinary Tract: There are subcentimeter cortical hypodensities in both kidneys favored as cysts. There is no hydronephrosis or perinephric fluid. The adrenal glands and bladder are within normal limits.   Stomach/Bowel: There is no evidence for bowel obstruction, pneumatosis, free air or inflammatory stranding. Again seen is sigmoid colon diverticulosis. There is questionable sigmoid colon wall thickening versus normal under distension. Stomach, small bowel and appendix are within normal limits.   Vascular/Lymphatic: Aortic atherosclerosis. No enlarged abdominal or pelvic lymph nodes.   Reproductive: Uterus and bilateral adnexa are unremarkable.    Other: No abdominal wall hernia or abnormality. No abdominopelvic ascites.   Musculoskeletal: Degenerative changes affect  the spine.   IMPRESSION: 1. Findings compatible with acute cholecystitis. There is intra and extrahepatic biliary ductal dilatation. 2. Questionable sigmoid colon wall thickening versus normal under distension. Correlate clinically for colitis. Follow-up colonoscopy should be considered. 3. Sigmoid colon diverticulosis. 4. Patchy nodular airspace opacity in the right upper lobe measuring 6 mm and 5 mm nodule in the left lower lobe. Non-contrast chest CT at 3-6 months is recommended.   ECHO 08/27/2022: IMPRESSIONS Left ventricular ejection fraction, by estimation, is 60 to 65%. The left ventricle has normal function. The left ventricle has no regional wall motion abnormalities. Left ventricular diastolic parameters are indeterminate. 1. Right ventricular systolic function is normal. The right ventricular size is normal. Tricuspid regurgitation signal is inadequate for assessing PA pressure. 2. 3. Left atrial size was upper normal. The mitral valve is degenerative. Mild mitral valve regurgitation. Mild mitral stenosis. The mean mitral valve gradient is 4.0 mmHg. Moderate mitral annular calcification. 4. The aortic valve is tricuspid. There is moderate calcification of the aortic valve. Aortic valve regurgitation is mild. Mild to moderate aortic valve stenosis. Aortic valve area, by VTI measures 1.24 cm. Aortic valve mean gradient measures 16.0 mmHg. Dimentionless index 0.49. 5. The inferior vena cava is normal in size with <50% respiratory variability, suggesting right atrial pressure of 8 mmHg.  PAST GI PROCEDURES:  EGD 02/17/2017: Normal proximal esophagus and mid esophagus. - Benign-appearing distal esophageal stenosis. Dilated. - LA Grade A reflux esophagitis. - 2 cm hiatal hernia. - Non-bleeding gastric ulcer with no stigmata of bleeding. -  Portal hypertensive gastropathy. - Normal duodenal bulb. 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 - Normal second portion of the duodenum. - No specimens collected. Comment per Dr Laural Golden: patient has developed erosive esophagitis since last EGD and gastric ulcer has not healed because she is not on PPI.  EGD 01/06/2017: - Normal mid esophagus and distal esophagus. - Benign-appearing esophageal stenosis. Dilated. - Z-line regular, 36 cm from the incisors. - 4 cm hiatal hernia. Biopsied. - Non-bleeding gastric ulcer. - Non-bleeding gastric ulcer. - Gastritis. - Normal duodenal bulb and second portion of the duodenum. -Repeat EGD in 4 weeks  EGD 01/29/2016: - Normal upper third of esophagus and middle third of esophagus. - Benign-appearing esophageal stenosis. Dilated to 16.5 mm with a balloon dilator - Z-line regular, 35 cm from the incisors. - Portal hypertensive gastropathy. - Normal duodenal bulb. - Acquired duodenal stenosis dilated to 13.5 mm with a balloon dilator. Scope still could not be passed distally on account of large stomach and location of stricture. - No specimens collected.  Past Medical History:  Diagnosis Date   Anemia    Anxiety    Aortic atherosclerosis ([place]) 04/21/2017   Arthritis    Bipolar 1 disorder (HCC)    Chronic back pain    COPD (chronic obstructive pulmonary disease) (HCC)    Diastolic dysfunction with heart failure ([place]) 04/20/2017   Duodenal stricture    Early onset Alzheimer's dementia (HCC)    GERD (gastroesophageal reflux disease)    Heart murmur    High cholesterol    Hypertension    Shortness of breath dyspnea     Past Surgical History:  Procedure Laterality Date   BIOPSY  01/06/2017   Procedure: BIOPSY;  Surgeon: Rogene Houston, MD;  [facility]: [facility];  Service: Endoscopy;;  esophageal biopsy   CESAREAN SECTION     COLONOSCOPY     ESOPHAGEAL DILATION N/A 10/29/2015   Procedure: ESOPHAGEAL DILATION;  Surgeon:  Rogene Houston, MD;  Location: [facility];  Service: Endoscopy;  Laterality: N/A;   ESOPHAGEAL DILATION N/A 01/29/2016   Procedure: ESOPHAGEAL DILATION;  Surgeon: Rogene Houston, MD;  [facility]: [facility];  Service: Endoscopy;  Laterality: N/A;   ESOPHAGEAL DILATION N/A 01/06/2017   Procedure: ESOPHAGEAL DILATION;  Surgeon: Rogene Houston, MD;  [facility];  Service: Endoscopy;  Laterality: N/A;   ESOPHAGOGASTRODUODENOSCOPY N/A 10/29/2015   Procedure: ESOPHAGOGASTRODUODENOSCOPY (EGD);  Surgeon: Rogene Houston, MD;  [facility];  Service: Endoscopy;  Laterality: N/A;  1200   ESOPHAGOGASTRODUODENOSCOPY (EGD) WITH PROPOFOL N/A 01/29/2016   Procedure: ESOPHAGOGASTRODUODENOSCOPY (EGD) WITH PROPOFOL;  Surgeon: Rogene Houston, MD;  [facility]: [facility];  Service: Endoscopy;  Laterality: N/A;  7:30 - moved to 4/21 _0 : 25 - Ann notified pt to arrive at 10:00   ESOPHAGOGASTRODUODENOSCOPY (EGD) WITH PROPOFOL N/A 01/06/2017   Procedure: ESOPHAGOGASTRODUODENOSCOPY (EGD) WITH PROPOFOL;  Surgeon: Rogene Houston, MD;  [facility];  Service: Endoscopy;  Laterality: N/A;  11:20   ESOPHAGOGASTRODUODENOSCOPY (EGD) WITH PROPOFOL N/A 02/17/2017   Procedure: ESOPHAGOGASTRODUODENOSCOPY (EGD) WITH PROPOFOL;  Surgeon: Rogene Houston, MD;  [facility];  Service: Endoscopy;  Laterality: N/A;  10:30   FOOT SURGERY Right    bunionectomy   HEMORRHOID SURGERY     IR PERC CHOLECYSTOSTOMY  08/27/2022    Prior to Admission medications   Medication Sig Start Date End Date Taking? Authorizing Provider  acetaminophen (TYLENOL) 650 MG CR tablet Take 650 mg by mouth every 8 (eight) hours as needed for pain.   Yes [provider]  albuterol (PROVENTIL HFA;VENTOLIN HFA) 108 (90 BASE) MCG/ACT inhaler Inhale 2 puffs into the lungs 4 (four) times daily. (0800, 1200, 1600, & 2000)   Yes [provider]  ALPRAZolam (XANAX) 1 MG tablet Take 0.5 tablets (0.5 mg  total) by mouth 3 (three) times daily as needed for anxiety. (0800, 1400, & 2000) Patient taking differently: Take 0.5 mg by mouth 2 (two) times daily. (0800 & 2000) 04/24/17  Yes Rosita Fire, MD  apixaban (ELIQUIS) 5 MG TABS tablet Take 1 tablet (5 mg total) by mouth 2 (two) times daily. 04/15/20  Yes Tat, Shanon Brow, MD  atorvastatin (LIPITOR) 10 MG tablet Take 1 tablet (10 mg total) by mouth daily at 8 pm. Resume on 04/22/20 if LFTs are back to normal Patient taking differently: Take 40 mg by mouth daily at 8 pm. 04/15/20  Yes Tat, Shanon Brow, MD  benztropine (COGENTIN) 1 MG tablet Take 0.5 mg by mouth 2 (two) times daily. 04/10/20  Yes [provider]  buPROPion (WELLBUTRIN XL) 150 MG 24 hr tablet Take 150 mg by mouth every morning. 03/11/20  Yes [provider]  donepezil (ARICEPT) 10 MG tablet Take 10 mg by mouth daily at 8 pm.   Yes [provider]  doxycycline (VIBRAMYCIN) 100 MG capsule Take 100 mg by mouth daily. 08/09/22  Yes [provider]  Ferrous Gluconate 324 (37.5 Fe) MG TABS Take 324 mg by mouth 2 (two) times daily. (0800 & 2000)   Yes [provider]  fluticasone (FLONASE) 50 MCG/ACT nasal spray Place 1 spray into both nostrils daily. (0800)   Yes [provider]  Fluticasone Furoate-Vilanterol (BREO ELLIPTA) 200-25 MCG/INH AEPB Inhale 1 puff into the lungs daily. (0800)   Yes [provider]  furosemide (LASIX) 40 MG tablet Take 40 mg by mouth daily. (0800)  Yes [provider]  gabapentin (NEURONTIN) 300 MG capsule Take 300 mg by mouth 3 (three) times daily. 11/14/19  Yes [provider]  hydrocortisone 1 % ointment Apply 1 Application topically 3 (three) times daily. 06/21/22  Yes [provider]  isosorbide mononitrate (IMDUR) 30 MG 24 hr tablet Take 30 mg by mouth daily. (0800)   Yes [provider]  LATUDA 80 MG TABS tablet Take 40 mg by mouth at bedtime. 11/26/19  Yes [provider]  memantine (NAMENDA) 10 MG tablet Take 10 mg by mouth 2 (two) times daily. 04/10/20  Yes [provider]  Menthol-Zinc Oxide (RISAMINE) 0.44-20.625 % OINT Apply 1 application topically 4 (four) times daily as needed (for rash/redness).   Yes [provider]  metoprolol succinate (TOPROL-XL) 50 MG 24 hr tablet Take 50 mg by mouth daily. (0800)Take with or immediately following a meal.   Yes [provider]  Olopatadine HCl (PATADAY) 0.2 % SOLN Place 1 drop into both eyes daily. (0800)   Yes [provider]  pantoprazole (PROTONIX) 40 MG tablet Take 1 tablet (40 mg total) by mouth 2 (two) times daily before a meal. 02/17/17  Yes Rehman, Mechele Dawley, MD  potassium chloride (KLOR-CON) 10 MEQ tablet Take 1 tablet by mouth daily. 04/10/20  Yes [provider]  tiZANidine (ZANAFLEX) 2 MG tablet Take 2 mg by mouth 2 (two) times daily. (0800 & 2000) 12/01/16  Yes [provider]  traZODone (DESYREL) 100 MG tablet Take 100 mg by mouth at bedtime. 04/10/20  Yes [provider]  venlafaxine XR (EFFEXOR-XR) 150 MG 24 hr capsule Take 300 mg by mouth daily. 04/10/20  Yes [provider]    Current Facility-Administered Medications  Medication Dose Route Frequency Provider Last Rate Last Admin   0.9 %  sodium chloride infusion   Intravenous Continuous Kc, Ramesh, MD       albuterol (PROVENTIL) (2.5 MG/3ML) 0.083% nebulizer solution 3 mL  3 mL Inhalation Q4H PRN Erick Colace, NP   3 mL at 08/30/22 0543   ALPRAZolam (XANAX) tablet 0.5 mg  0.5 mg Oral TID PRN Erick Colace, NP   0.5 mg at 09/03/22 2156   apixaban (ELIQUIS) tablet 5 mg  5 mg Oral BID Erick Colace, NP   5 mg at 09/04/22 0849   atorvastatin (LIPITOR) tablet 40 mg  40 mg Oral QHS Erick Colace, NP   40 mg at 09/03/22 2055   benztropine (COGENTIN) tablet 0.5 mg  0.5 mg Oral BID Erick Colace, NP   0.5 mg at 09/04/22 0849   buPROPion (WELLBUTRIN XL) 24 hr tablet  150 mg  150 mg Oral q morning Erick Colace, NP   150 mg at 09/04/22 0849   ceFEPIme (MAXIPIME) 2 g in sodium chloride 0.9 % 100 mL IVPB  2 g Intravenous Q8H Erick Colace, NP 200 mL/hr at 09/04/22 0513 2 g at 09/04/22 0513   Chlorhexidine Gluconate Cloth 2 % PADS 6 each  6 each Topical Daily Erick Colace, NP   6 each at 09/03/22 0955   donepezil (ARICEPT) tablet 10 mg  10 mg Oral Q2000 Erick Colace, NP   10 mg at 09/03/22 2054   feeding supplement (ENSURE ENLIVE / ENSURE PLUS) liquid 237 mL  237 mL Oral BID BM Kc, Ramesh, MD   237 mL at 09/04/22 0850   fluticasone (FLONASE) 50 MCG/ACT nasal spray 1 spray  1 spray Each  Nare Daily Erick Colace, NP   1 spray at 09/04/22 0849   fluticasone furoate-vilanterol (BREO ELLIPTA) 200-25 MCG/ACT 1 puff  1 puff Inhalation Daily Erick Colace, NP   1 puff at 09/04/22 0846   gabapentin (NEURONTIN) capsule 300 mg  300 mg Oral TID Erick Colace, NP   300 mg at 09/04/22 0849   guaiFENesin (ROBITUSSIN) 100 MG/5ML liquid 5 mL  5 mL Oral Q4H PRN Darliss Cheney, MD   5 mL at 09/01/22 2042   iohexol (OMNIPAQUE) 300 MG/ML solution 50 mL  50 mL Per Tube Once PRN Erick Colace, NP       isosorbide mononitrate (IMDUR) 24 hr tablet 15 mg  15 mg Oral Daily Erick Colace, NP   15 mg at 09/04/22 0849   lurasidone (LATUDA) tablet 80 mg  80 mg Oral QHS Erick Colace, NP   80 mg at 09/03/22 2054   memantine (NAMENDA) tablet 10 mg  10 mg Oral BID Erick Colace, NP   10 mg at 09/04/22 0849   metoprolol succinate (TOPROL-XL) 24 hr tablet 12.5 mg  12.5 mg Oral Daily Salvadore Dom E, NP   12.5 mg at 09/04/22 0849   metroNIDAZOLE (FLAGYL) IVPB 500 mg  500 mg Intravenous Q12H Erick Colace, NP 100 mL/hr at 09/04/22 0017 500 mg at 09/04/22 0017   multivitamin with minerals tablet 1 tablet  1 tablet Oral Daily Kc, Maren Beach, MD   1 tablet at 09/03/22 0955   ondansetron (ZOFRAN) injection 4 mg  4 mg Intravenous Q6H PRN Erick Colace, NP   4 mg at  08/26/22 2001   oxyCODONE (Oxy IR/ROXICODONE) immediate release tablet 5 mg  5 mg Oral Q4H PRN Erick Colace, NP   5 mg at 09/03/22 2156   pantoprazole (PROTONIX) EC tablet 40 mg  40 mg Oral BID AC Erick Colace, NP   40 mg at 09/04/22 0849   sodium chloride flush (NS) 0.9 % injection 5 mL  5 mL Intracatheter Q8H Erick Colace, NP   5 mL at 09/04/22 0513   tiZANidine (ZANAFLEX) tablet 2 mg  2 mg Oral BID Erick Colace, NP   2 mg at 09/04/22 0849   traZODone (DESYREL) tablet 100 mg  100 mg Oral QHS Erick Colace, NP   100 mg at 09/03/22 2054   venlafaxine XR (EFFEXOR-XR) 24 hr capsule 300 mg  300 mg Oral Daily Erick Colace, NP   300 mg at 09/04/22 6712    Allergies as of 08/25/2022 - Review Complete 08/25/2022  Allergen Reaction Noted   Penicillins Rash 12/23/2015    History reviewed. No pertinent family history.  Social History   Socioeconomic History   Marital status: Divorced    Spouse name: Not on file   Number of children: Not on file   Years of education: Not on file   Highest education level: Not on file  Occupational History   Not on file  Tobacco Use   Smoking status: Former    Packs/day: 2.00    Years: 35.00    Total pack years: 70.00    Types: Cigarettes    Quit date: 07/25/2015    Years since quitting: 7.1   Smokeless tobacco: Never   Tobacco comments:    quit 2 weeks ago (November 2016)  Vaping Use   Vaping Use: Never used  Substance and Sexual Activity   Alcohol use: No    Alcohol/week: 0.0  standard drinks of alcohol   Drug use: No   Sexual activity: Not Currently    Birth control/protection: None  Other Topics Concern   Not on file  Social History Narrative   Not on file   Social Determinants of Health   Financial Resource Strain: Not on file  Food Insecurity: No Food Insecurity (09/01/2022)   Hunger Vital Sign    Worried About Running Out of Food in the Last Year: Never true    Ran Out of Food in the Last Year: Never true   Transportation Needs: No Transportation Needs (09/01/2022)   PRAPARE - Hydrologist (Medical): No    Lack of Transportation (Non-Medical): No  Physical Activity: Not on file  Stress: Not on file  Social Connections: Not on file  Intimate Partner Violence: Not At Risk (09/01/2022)   Humiliation, Afraid, Rape, and Kick questionnaire    Fear of Current or Ex-Partner: No    Emotionally Abused: No    Physically Abused: No    Sexually Abused: No   Review of Systems: Gen: Denies fever, sweats or chills. No weight loss.  CV: Denies chest pain, palpitations or edema. Resp: Denies cough, shortness of breath of hemoptysis.  GI: See HPI. GU : Denies urinary burning, blood in urine, increased urinary frequency or incontinence. MS: Denies joint pain, muscles aches or weakness. Derm: Denies rash, itchiness, skin lesions or unhealing ulcers. Psych: + Anxiety and bipolar disorder. Heme: Denies easy bruising, bleeding. Neuro:  + Memory loss. Denies headaches, dizziness or paresthesias. Endo:  Denies any problems with DM, thyroid or adrenal function.  Physical Exam: Vital signs in last 24 hours: Temp:  [98.5 F (36.9 C)-98.9 F (37.2 C)] 98.9 F (37.2 C) (12/31 0752) Pulse Rate:  [78-85] 78 (12/31 0752) Resp:  [16-20] 18 (12/31 0752) BP: (133-139)/(56-70) 133/70 (12/31 0752) SpO2:  [95 %-100 %] 100 % (12/31 0752) Last BM Date : 09/04/22 General: 71 year old female in no acute distress. Head:  Normocephalic and atraumatic. Eyes:  No scleral icterus. Conjunctiva pink. Ears:  Normal auditory acuity. Nose:  No deformity, discharge or lesions. Mouth:  Dentition intact. No ulcers or lesions.  Neck:  Supple. No lymphadenopathy or thyromegaly.  Lungs: Breath sounds clear throughout. No wheezes, rhonchi or crackles.  Heart: Regular rate and rhythm, systolic murmur. Abdomen: Soft, moderate tenderness to the RUQ which extends down to the RLQ without rebound or guarding.   RUQ PERC drain dressing intact with a small amount of serosanguineous drainage in the JP drain.  Positive bowel sounds to all 4 quadrants. Rectal: Deferred. Musculoskeletal:  Symmetrical without gross deformities.  Pulses:  Normal pulses noted. Extremities: No edema. Neurologic:  Alert and  oriented x 3. No focal deficits.  Skin:  Intact without significant lesions or rashes. Psych:  Alert and cooperative. Normal mood and affect.  Intake/Output from previous day: 12/30 0701 - 12/31 0700 In: 135 [P.O.:120; I.V.:10] Out: 1225 [Drains:25; CBSWH:6759] Intake/Output this shift: Total I/O In: 120 [P.O.:120] Out: -   Lab Results: Recent Labs    09/03/22 0301 09/03/22 1809 09/04/22 0240  WBC 16.1* 16.1* 19.8*  HGB 10.9* 11.6* 11.1*  HCT 32.7* 34.5* 34.5*  PLT 389 399 416*   BMET Recent Labs    09/03/22 0301 09/03/22 1528 09/03/22 1809 09/04/22 0240  NA 139  --  136 138  K 2.9* 4.1 3.9 3.7  CL 113*  --  111 108  CO2 21*  --  20* 20*  GLUCOSE  127*  --  127* 126*  BUN 8  --  7* 7*  CREATININE 0.83  --  0.77 0.77  CALCIUM 8.6*  --  8.8* 9.0   LFT Recent Labs    09/04/22 0926  PROT 6.4*  ALBUMIN 2.3*  AST 25  ALT 13  ALKPHOS 60  BILITOT 0.1*  BILIDIR <0.1  IBILI NOT CALCULATED   PT/INR No results for input(s): "LABPROT", "INR" in the last 72 hours. Hepatitis Panel No results for input(s): "HEPBSAG", "HCVAB", "HEPAIGM", "HEPBIGM" in the last 72 hours.    Studies/Results: US ABDOMEN LIMITED RUQ (LIVER/GB)  Result Date: 09/04/2022 CLINICAL DATA:  Leukocytosis. Status post percutaneous cholecystostomy for cholecystitis. EXAM: ULTRASOUND ABDOMEN LIMITED COMPARISON:  08/26/2022. FINDINGS: No focal hepatic parenchymal abnormalities. No biliary ductal dilatation identified. The CBD was 12 mm. The gallbladder was not visualized. IMPRESSION: CBD dilatation. Gallbladder not visualized. No hepatic parenchymal abnormalities. Electronically Signed   By: Sammie Bench  M.D.   On: 09/04/2022 11:56    IMPRESSION/PLAN:  71) 71 year old female admitted to the hospital 08/25/2022 with altered mental status, leukocytosis secondary to acute cholecystitis. Normal LFTs and lipase levels. CTAP findings concerning for acute cholecystitis with intra and extrahepatic biliary ductal dilatation. RUQ sonogram 08/26/2022 showed a distended gallbladder with diffuse gallbladder wall thickening and moderate sludge with a positive sonographic Murphy sign and a dilated CBD duct measuring 10 mm. Abdominal MRI/MRCP 08/26/2022 showed a distended gallbladder with sludge and diffuse wall thickening with pericholecystic edema without gallstones, mild diffuse intrahepatic biliary ductal dilatation with a dilated CBD duct 13 mm with a possible stone in the lower third of the CBD, mild diffuse hepatic steatosis, tiny hiatal hernia and moderate diffuse colonic diverticulosis. HIDA scan 08/26/2022 showed nonvisualization of the gallbladder consistent with acute acalculous cholecystitis. S/P percutaneous cholecystostomy tube 08/27/2022. Evaluated by general surgery, eventual cholecystectomy when acute cholecystitis stabilizes.  On IV antibiotics.  She has persistent leukocytosis with RUQ/RLQ pain. RUQ sonogram today showed CBD dilatation measuring 12 mm.  LFTs and lipase levels remain normal.  Perc chole tube assessed and flush easily today per IR. -Diet as tolerated -CTAP with oral and IV contrast to assess PCT placement to rule out any intra-abdominal/pelvic inflammatory/infectious process to explain her symptoms and persistent leukocytosis -Possible EGD/EUS next week -Pain management per the hospitalist  2) History of GERD, esophageal stenosis, history of ulcers and acquired duodenal stenosis -Continue Pantoprazole 40 mg p.o. twice daily  3) Acute metabolic encephalopathy, likely due to sepsis with underlying dementia.  Patient is alert and oriented x 3 at this time.  4) Acute on chronic anemia.   Hemoglobin 11.1.  MCV 91.3.  No overt GI bleeding.   -Check CBC, iron panel and B12 in a.m.  5) Atrial fibrillation on Eliquis  6) history of chronic diastolic CHF  7) History of COPD   Noralyn Pick  09/04/2022, 2:33PM

## 2022-09-04 NOTE — Progress Notes (Signed)
Pharmacy Antibiotic Note  Kathleen Valenzuela is a 71 y.o. female admitted on 08/25/2022 with sepsis and  acute cholecystitis .  Pharmacy has been consulted for cefepime dosing. Persistent leukocytosis.  S/p cholecystotomy tube placement by IR on 08/27/2022  Plan: Continue cefepime 2 grams IV every 8 hours Continue Flagyl per MD Monitor clinical progress, cultures/sensitivities, renal function, abx plan  Height: '5\' 7"'$  (170.2 cm) Weight: 78.6 kg (173 lb 4.5 oz) IBW/kg (Calculated) : 61.6  Temp (24hrs), Avg:98.8 F (37.1 C), Min:98.5 F (36.9 C), Max:98.9 F (37.2 C)  Recent Labs  Lab 08/31/22 0433 09/01/22 1256 09/02/22 0948 09/03/22 0301 09/03/22 1809 09/04/22 0240  WBC 18.5* 18.1* 14.1* 16.1* 16.1* 19.8*  CREATININE 0.84 0.72  --  0.83 0.77 0.77     Estimated Creatinine Clearance: 70.7 mL/min (by C-G formula based on SCr of 0.77 mg/dL).    Allergies  Allergen Reactions   Penicillins Rash    Has patient had a PCN reaction causing immediate rash, facial/tongue/throat swelling, SOB or lightheadedness with hypotension: Yes Has patient had a PCN reaction causing severe rash involving mucus membranes or skin necrosis: No Has patient had a PCN reaction that required hospitalization: No Has patient had a PCN reaction occurring within the last 10 years: No If all of the above answers are "NO", then may proceed with Cephalosporin use. Ceftriaxone ok    Antimicrobials this admission: 12/21 cefepime >>  12/21 Flagyl >>   Dose adjustments this admission:  Microbiology results: 12/21 Blood: negative 12/21 Urine: insignificant growth 12/23 abscess from gallbladder: cancelled 12/21 COVID, flu and RSV: negative 12/26 MRSA PCR negative   Thank you for allowing [place] to participate in this patients care. Jens Som, PharmD 09/04/2022 12:46 PM  **Pharmacist phone directory can be found on [email or web address] listed under [place]**

## 2022-09-04 NOTE — Progress Notes (Signed)
Referring Physician(s): * No referring provider recorded for this case *  Supervising Physician: Daryll Brod  Patient Status:  [facility] - In-pt  Chief Complaint:  Acute cholecystitis s/p cholecystomy tube placed on 12.23.23 by Dr. Denna Haggard  Subjective:  Patient laying in beds. States that she is thirsty.  Allergies: Penicillins  Medications: Prior to Admission medications   Medication Sig Start Date End Date Taking? Authorizing Provider  acetaminophen (TYLENOL) 650 MG CR tablet Take 650 mg by mouth every 8 (eight) hours as needed for pain.   Yes [provider]  albuterol (PROVENTIL HFA;VENTOLIN HFA) 108 (90 BASE) MCG/ACT inhaler Inhale 2 puffs into the lungs 4 (four) times daily. (0800, 1200, 1600, & 2000)   Yes [provider]  ALPRAZolam (XANAX) 1 MG tablet Take 0.5 tablets (0.5 mg total) by mouth 3 (three) times daily as needed for anxiety. (0800, 1400, & 2000) Patient taking differently: Take 0.5 mg by mouth 2 (two) times daily. (0800 & 2000) 04/24/17  Yes Rosita Fire, MD  apixaban (ELIQUIS) 5 MG TABS tablet Take 1 tablet (5 mg total) by mouth 2 (two) times daily. 04/15/20  Yes Tat, Shanon Brow, MD  atorvastatin (LIPITOR) 10 MG tablet Take 1 tablet (10 mg total) by mouth daily at 8 pm. Resume on 04/22/20 if LFTs are back to normal Patient taking differently: Take 40 mg by mouth daily at 8 pm. 04/15/20  Yes Tat, Shanon Brow, MD  benztropine (COGENTIN) 1 MG tablet Take 0.5 mg by mouth 2 (two) times daily. 04/10/20  Yes [provider]  buPROPion (WELLBUTRIN XL) 150 MG 24 hr tablet Take 150 mg by mouth every morning. 03/11/20  Yes [provider]  donepezil (ARICEPT) 10 MG tablet Take 10 mg by mouth daily at 8 pm.   Yes [provider]  doxycycline (VIBRAMYCIN) 100 MG capsule Take 100 mg by mouth daily. 08/09/22  Yes [provider]  Ferrous Gluconate 324 (37.5 Fe) MG TABS Take 324 mg by mouth 2 (two) times daily. (0800 & 2000)   Yes  [provider]  fluticasone (FLONASE) 50 MCG/ACT nasal spray Place 1 spray into both nostrils daily. (0800)   Yes [provider]  Fluticasone Furoate-Vilanterol (BREO ELLIPTA) 200-25 MCG/INH AEPB Inhale 1 puff into the lungs daily. (0800)   Yes [provider]  furosemide (LASIX) 40 MG tablet Take 40 mg by mouth daily. (0800)   Yes [provider]  gabapentin (NEURONTIN) 300 MG capsule Take 300 mg by mouth 3 (three) times daily. 11/14/19  Yes [provider]  hydrocortisone 1 % ointment Apply 1 Application topically 3 (three) times daily. 06/21/22  Yes [provider]  isosorbide mononitrate (IMDUR) 30 MG 24 hr tablet Take 30 mg by mouth daily. (0800)   Yes [provider]  LATUDA 80 MG TABS tablet Take 40 mg by mouth at bedtime. 11/26/19  Yes [provider]  memantine (NAMENDA) 10 MG tablet Take 10 mg by mouth 2 (two) times daily. 04/10/20  Yes [provider]  Menthol-Zinc Oxide (RISAMINE) 0.44-20.625 % OINT Apply 1 application topically 4 (four) times daily as needed (for rash/redness).   Yes [provider]  metoprolol succinate (TOPROL-XL) 50 MG 24 hr tablet Take 50 mg by mouth daily. (0800)Take with or immediately following a meal.   Yes [provider]  Olopatadine HCl (PATADAY) 0.2 % SOLN Place 1 drop into both eyes daily. (0800)   Yes [provider]  pantoprazole (PROTONIX) 40 MG tablet  Take 1 tablet (40 mg total) by mouth 2 (two) times daily before a meal. 02/17/17  Yes Rehman, Mechele Dawley, MD  potassium chloride (KLOR-CON) 10 MEQ tablet Take 1 tablet by mouth daily. 04/10/20  Yes [provider]  tiZANidine (ZANAFLEX) 2 MG tablet Take 2 mg by mouth 2 (two) times daily. (0800 & 2000) 12/01/16  Yes [provider]  traZODone (DESYREL) 100 MG tablet Take 100 mg by mouth at bedtime. 04/10/20  Yes [provider]  venlafaxine XR (EFFEXOR-XR) 150 MG 24 hr capsule Take 300  mg by mouth daily. 04/10/20  Yes [provider]     Vital Signs: BP 133/70   Pulse 78   Temp 98.9 F (37.2 C) (Oral)   Resp 18   Ht '5\' 7"'$  (1.702 m)   Wt 173 lb 4.5 oz (78.6 kg)   SpO2 100%   BMI 27.14 kg/m   Physical Exam Vitals and nursing note reviewed.  Constitutional:      Appearance: She is well-developed.  HENT:     Head: Normocephalic and atraumatic.  Eyes:     Conjunctiva/sclera: Conjunctivae normal.  Pulmonary:     Effort: Pulmonary effort is normal.  Abdominal:     Comments: Positive RUQ (chole)  drain to suction. Site is unremarkable with no erythema, edema, tenderness, bleeding or drainage noted at exit site. Suture and stat lock in place. Dressing is clean dry and intact. < 2 ml of  serosanguinous colored fluid noted in  bulb suction device. Drain is able to be flushed  and aspirated easily. No leakage or pain with flushing.  Insertion site clean and dry.     Musculoskeletal:     Cervical back: Normal range of motion.  Neurological:     Mental Status: She is alert.     Imaging: US ABDOMEN LIMITED RUQ (LIVER/GB)  Result Date: 09/04/2022 CLINICAL DATA:  Leukocytosis. Status post percutaneous cholecystostomy for cholecystitis. EXAM: ULTRASOUND ABDOMEN LIMITED COMPARISON:  08/26/2022. FINDINGS: No focal hepatic parenchymal abnormalities. No biliary ductal dilatation identified. The CBD was 12 mm. The gallbladder was not visualized. IMPRESSION: CBD dilatation. Gallbladder not visualized. No hepatic parenchymal abnormalities. Electronically Signed   By: Sammie Bench M.D.   On: 09/04/2022 11:56    Labs:  CBC: Recent Labs    09/02/22 0948 09/03/22 0301 09/03/22 1809 09/04/22 0240  WBC 14.1* 16.1* 16.1* 19.8*  HGB 11.7* 10.9* 11.6* 11.1*  HCT 35.0* 32.7* 34.5* 34.5*  PLT 358 389 399 416*    COAGS: Recent Labs    08/25/22 2018  INR 1.1    BMP: Recent Labs    09/01/22 1256 09/03/22 0301 09/03/22 1528 09/03/22 1809 09/04/22 0240   NA 138 139  --  136 138  K 3.5 2.9* 4.1 3.9 3.7  CL 112* 113*  --  111 108  CO2 19* 21*  --  20* 20*  GLUCOSE 98 127*  --  127* 126*  BUN 11 8  --  7* 7*  CALCIUM 8.4* 8.6*  --  8.8* 9.0  CREATININE 0.72 0.83  --  0.77 0.77  GFRNONAA >60 >60  --  >60 >60    LIVER FUNCTION TESTS: Recent Labs    08/29/22 0230 08/30/22 0350 08/31/22 0433 09/04/22 0926  BILITOT 0.5 0.8 1.1 0.1*  AST '17 15 19 25  '$ ALT '13 11 13 13  '$ ALKPHOS 56 65 61 60  PROT 5.5* 6.1* 6.4* 6.4*  ALBUMIN 2.1* 2.3* 2.3* 2.3*    Assessment and  Plan:  71 y.o. female inpatient. History of COPD, HLD, HTN, dementia. Admitted to [facility] for [place] on 12.22.23. Found to have acute cholecystitis. IR placed a chole tube on 12.23.23.  Drain Location: RUQ - chole tube Size: Fr size: 10 Fr Date of placement: 12.23.23  Currently to: Drain collection device: gravity 24 hour output:  Output by Drain (mL) 09/02/22 0701 - 09/02/22 1900 09/02/22 1901 - 09/03/22 0700 09/03/22 0701 - 09/03/22 1900 09/03/22 1901 - 09/04/22 0700 09/04/22 0701 - 09/04/22 1224  Closed System Drain 1 Lateral RUQ Bulb (JP) 10.2 Fr.  55  25    WBC 19.8. No cultures from abscess drawn.  Interval imaging/drain manipulation:  [place] abd. Gallbladder not visualized.  Current examination: Flushes/aspirates easily.  Insertion site unremarkable. Suture and stat lock in place. Dressed appropriately.   Plan: Continue TID flushes with 5 cc NS. Record output Q shift. Dressing changes QD or PRN if soiled.  Call IR APP or on call IR MD if difficulty flushing or sudden change in drain output.  Repeat imaging/possible drain injection once output < 10 mL/QD (excluding flush material). Consideration for drain removal if output is < 10 mL/QD (excluding flush material), pending discussion with the providing surgical service.  Discharge planning: Please contact IR APP or on call IR MD prior to patient d/c to ensure appropriate follow up plans are in place. Typically patient  will follow up with IR  in 6-8 weeks. OP orders placed. Scheduler will contact patient with date/time of appointment. Patient will need to flush drain QD with 5 cc NS, record output QD, dressing changes every 2-3 days or earlier if soiled.   IR will continue to follow - please call with questions or concerns.   Electronically Signed: Jacqualine Mau, NP 09/04/2022, 12:22 PM   I spent a total of 15 Minutes at the patient's bedside AND on the patient's hospital floor or unit, greater than 50% of which was counseling/coordinating care for cholecystomy tube placement

## 2022-09-05 DIAGNOSIS — K810 Acute cholecystitis: Secondary | ICD-10-CM | POA: Diagnosis not present

## 2022-09-05 LAB — COMPREHENSIVE METABOLIC PANEL
ALT: 17 U/L (ref 0–44)
AST: 37 U/L (ref 15–41)
Albumin: 2.4 g/dL — ABNORMAL LOW (ref 3.5–5.0)
Alkaline Phosphatase: 64 U/L (ref 38–126)
Anion gap: 11 (ref 5–15)
BUN: 9 mg/dL (ref 8–23)
CO2: 20 mmol/L — ABNORMAL LOW (ref 22–32)
Calcium: 9.2 mg/dL (ref 8.9–10.3)
Chloride: 105 mmol/L (ref 98–111)
Creatinine, Ser: 0.78 mg/dL (ref 0.44–1.00)
GFR, Estimated: >60 mL/min (ref >60)
Glucose, Bld: 113 mg/dL — ABNORMAL HIGH (ref 70–99)
Potassium: 3.8 mmol/L (ref 3.5–5.1)
Sodium: 136 mmol/L (ref 135–145)
Total Bilirubin: 0.4 mg/dL (ref 0.3–1.2)
Total Protein: 6.4 g/dL — ABNORMAL LOW (ref 6.5–8.1)

## 2022-09-05 LAB — CBC
HCT: 35.1 % — ABNORMAL LOW (ref 36.0–46.0)
Hemoglobin: 11.5 g/dL — ABNORMAL LOW (ref 12.0–15.0)
MCH: 29.6 pg (ref 26.0–34.0)
MCHC: 32.8 g/dL (ref 30.0–36.0)
MCV: 90.2 fL (ref 80.0–100.0)
Platelets: 420 10*3/uL — ABNORMAL HIGH (ref 150–400)
RBC: 3.89 MIL/uL (ref 3.87–5.11)
RDW: 15.4 % (ref 11.5–15.5)
WBC: 20.9 10*3/uL — ABNORMAL HIGH (ref 4.0–10.5)
nRBC: 0 % (ref 0.0–0.2)

## 2022-09-05 MED ORDER — SODIUM CHLORIDE 0.9 % IV SOLN: INTRAVENOUS | Status: AC

## 2022-09-05 NOTE — TOC Progression Note (Addendum)
Transition of Care [facility]) - Progression Note    Patient Details  Name: Kathleen Valenzuela MRN: [id number] Date of Birth: Apr 30, 1951  [facility] CM/SW Contact  Joanne Chars, LCSW Phone Number: 09/05/2022, 10:17 AM  Clinical Narrative:   Josem Kaufmann remains pending in [place] and now has note that the request is outside of the pt's eligibility dates.  [employer] closed for the holiday.    CSW spoke with son [name], he is not aware of any change in insurance coverage for pt.   Expected Discharge Plan: [place] Barriers to Discharge: Continued Medical Work up, SNF Pending bed offer  Expected Discharge Plan and Services In-house Referral: Clinical Social Work   Post Acute Care Choice: [place] Living arrangements for the past 2 months: [place] ([place] in [place])                                       Social Determinants of Health (SDOH) Interventions [place]: No Food Insecurity (09/01/2022)  Housing: Low Risk  (09/01/2022)  Transportation Needs: No Transportation Needs (09/01/2022)  Utilities: Not At Risk (09/01/2022)  Tobacco Use: Medium Risk (08/27/2022)    Readmission Risk Interventions     No data to display

## 2022-09-05 NOTE — Progress Notes (Signed)
Gastroenterology Inpatient Follow Up    Subjective: Patient is still having pain in the right side. She is eating.  Objective: Vital signs in last 24 hours: Temp:  [98 F (36.7 C)-98.5 F (36.9 C)] 98.5 F (36.9 C) (01/01 1017) Pulse Rate:  [79-80] 79 (01/01 1017) Resp:  [18-20] 18 (01/01 1017) BP: (118-143)/(62-74) 143/72 (01/01 1017) SpO2:  [98 %-99 %] 99 % (01/01 1017) Last BM Date : 09/04/22  Intake/Output from previous day: 12/31 0701 - 01/01 0700 In: 965.3 [P.O.:625; I.V.:40.3; IV Piggyback:300] Out: 1935 [Urine:1850; Drains:85] Intake/Output this shift: No intake/output data recorded.  General appearance: alert and cooperative Resp: no increased WOB Cardio: regular rate GI: Tender in the right side of the abdomen. RUQ PCT drain with serosanguinous drainage. Extremities: no BLE edema  Lab Results: Recent Labs    09/03/22 1809 09/04/22 0240 09/05/22 0247  WBC 16.1* 19.8* 20.9*  HGB 11.6* 11.1* 11.5*  HCT 34.5* 34.5* 35.1*  PLT 399 416* 420*   BMET Recent Labs    09/03/22 1809 09/04/22 0240 09/05/22 0247  NA 136 138 136  K 3.9 3.7 3.8  CL 111 108 105  CO2 20* 20* 20*  GLUCOSE 127* 126* 113*  BUN 7* 7* 9  CREATININE 0.77 0.77 0.78  CALCIUM 8.8* 9.0 9.2   LFT Recent Labs    09/04/22 0926 09/05/22 0247  PROT 6.4* 6.4*  ALBUMIN 2.3* 2.4*  AST 25 37  ALT 13 17  ALKPHOS 60 64  BILITOT 0.1* 0.4  BILIDIR <0.1  --   IBILI NOT CALCULATED  --    PT/INR No results for input(s): "LABPROT", "INR" in the last 72 hours. Hepatitis Panel No results for input(s): "HEPBSAG", "HCVAB", "HEPAIGM", "HEPBIGM" in the last 72 hours. C-Diff No results for input(s): "CDIFFTOX" in the last 72 hours.  Studies/Results: CT ABDOMEN PELVIS W CONTRAST  Result Date: 09/04/2022 CLINICAL DATA:  RLQ abdominal pain RUQ pain, assess placement of perc chole drain, persistent leukocytosis EXAM: CT ABDOMEN AND PELVIS WITH CONTRAST TECHNIQUE: Multidetector CT imaging of  the abdomen and pelvis was performed using the standard protocol following bolus administration of intravenous contrast. RADIATION DOSE REDUCTION: This exam was performed according to the departmental dose-optimization program which includes automated exposure control, adjustment of the mA and/or kV according to patient size and/or use of iterative reconstruction technique. CONTRAST:  98m OMNIPAQUE IOHEXOL 350 MG/ML SOLN COMPARISON:  CT chest abdomen pelvis 08/25/2022 FINDINGS: Lower chest: Bilateral trace pleural effusions. Associated passive atelectasis of the bilateral lower lobes. Patchy peribronchovascular airspace opacities within the lingula. Coronary artery calcification and mitral annular calcification. Hepatobiliary: No focal liver abnormality. Cholecystostomy tube terminates within a decompressed gallbladder lumen. No CT evidence of gallstones, gallbladder wall thickening, or pericholecystic fluid. No biliary dilatation. Pancreas: No focal lesion. Normal pancreatic contour. No surrounding inflammatory changes. No main pancreatic ductal dilatation. Spleen: Similar-appearing 1.7 cm hypodense lesion within the spleen clearance the spleen is normal size. Adrenals/Urinary Tract: No adrenal nodule bilaterally. Bilateral kidneys enhance symmetrically. Subcentimeter hypodensities too small to characterize-no further follow-up indicated. No hydronephrosis. No hydroureter. The urinary bladder is unremarkable. On delayed imaging, there is no urothelial wall thickening and there are no filling defects in the opacified portions of the bilateral collecting systems or ureters. Stomach/Bowel: PO contrast reaches the large bowel. Stomach is within normal limits. No evidence of bowel wall thickening or dilatation. Diffuse sigmoid and otherwise scattered colonic diverticulosis. Appendix appears normal. Vascular/Lymphatic: No abdominal aorta or iliac aneurysm. Severe atherosclerotic plaque of  the aorta and its branches. No  abdominal, pelvic, or inguinal lymphadenopathy. Reproductive: Uterus and bilateral adnexa are unremarkable. Other: No intraperitoneal free fluid. No intraperitoneal free gas. Interval development of a 4.1 x 1.8 cm right anterolateral organized fluid collection with mild peripheral enhancement. Likely second organized fluid collection developing within the right adnexal region and measuring 1.9 x 1.6 cm. Possible developing fluid collection along the rectouterine cholestatic. Musculoskeletal: No abdominal wall hernia or abnormality. No suspicious lytic or blastic osseous lesions. No acute displaced fracture. IMPRESSION: 1. Patchy peribronchovascular airspace opacities within the lingula. Findings suggest infection/inflammation. 2. Bilateral trace pleural effusions. 3. Interval development of a 4.1 x 1.8 cm right anterolateral abscess formation. Likely second abscess forming measuring 1.9 x 1.6 cm in the region of the right adnexa. 4. Cholecystostomy tube in good position with decompressed gallbladder lumen. 5. Colonic diverticulosis with no acute diverticulitis. 6. Aortic Atherosclerosis (ICD10-I70.0) with coronary artery and mitral annular calcification. Electronically Signed   By: Iven Finn M.D.   On: 09/04/2022 23:56   US ABDOMEN LIMITED RUQ (LIVER/GB)  Result Date: 09/04/2022 CLINICAL DATA:  Leukocytosis. Status post percutaneous cholecystostomy for cholecystitis. EXAM: ULTRASOUND ABDOMEN LIMITED COMPARISON:  08/26/2022. FINDINGS: No focal hepatic parenchymal abnormalities. No biliary ductal dilatation identified. The CBD was 12 mm. The gallbladder was not visualized. IMPRESSION: CBD dilatation. Gallbladder not visualized. No hepatic parenchymal abnormalities. Electronically Signed   By: Sammie Bench M.D.   On: 09/04/2022 11:56    Medications: I have reviewed the patient's current medications. Scheduled:  apixaban  5 mg Oral BID   atorvastatin  40 mg Oral QHS   benztropine  0.5 mg Oral BID    buPROPion  150 mg Oral q morning   Chlorhexidine Gluconate Cloth  6 each Topical Daily   donepezil  10 mg Oral Q2000   feeding supplement  237 mL Oral BID BM   fluticasone  1 spray Each Nare Daily   fluticasone furoate-vilanterol  1 puff Inhalation Daily   gabapentin  300 mg Oral TID   isosorbide mononitrate  15 mg Oral Daily   lurasidone  80 mg Oral QHS   memantine  10 mg Oral BID   metoprolol succinate  12.5 mg Oral Daily   multivitamin with minerals  1 tablet Oral Daily   pantoprazole  40 mg Oral BID AC   sodium chloride flush  5 mL Intracatheter Q8H   tiZANidine  2 mg Oral BID   traZODone  100 mg Oral QHS   venlafaxine XR  300 mg Oral Daily   Continuous:  sodium chloride 50 mL/hr at 09/05/22 1206   ceFEPime (MAXIPIME) IV 2 g (09/05/22 0502)   metronidazole 500 mg (09/05/22 1207)   LZJ:QBHALPFXT, ALPRAZolam, guaiFENesin, iohexol, [DISCONTINUED] ondansetron **OR** ondansetron (ZOFRAN) IV, oxyCODONE  Assessment/Plan: 72 year old female with history of HFpE, A-fib on Eliquis, COPD, dementia, GERD, gastric ulcers, and duodenal stenosis presented with AMS, found to have acute cholecystitis s/p PCT on 08/27/22. We are consulted for persistent RUQ ab pain, elevated WBC, and dilated CBD. RUQ U/S showed some CBD dilation, and MRCP showed a CBD filling defect. However, LFTs have been normal during this entire hospitalization and HIDA scan showed a patent CBD. We did discuss this with our biliary colleague who did not think that ERCP is indicated at this time. Due to right sided ab pain, CT A/P was done yesterday that showed a 4.1 x 1.8 cm right anterolateral abscess and second abscess forming 1.9 x 1.5 cm in  the region of the right adnexa. PCT tube appears to be in a good position on CT scan. CT also noted patchy air space opacities that are concerning for infection versus inflammation. Surgery was consulted regarding these intra-abdominal abscesses and do not feel that surgical drainage is  indicated at this time.   - Continue antibiotics - Recommend IR consideration of drain placement into the newly discovered intra-abdominal abscesses and whether these abscesses connect with the area of PCT placement - Consider EUS in the future for dilated CBD. Would need Eliquis held for 2 days beforehand.   LOS: 11 days   Sharyn Creamer 09/05/2022, 2:17 PM

## 2022-09-05 NOTE — Progress Notes (Signed)
PROGRESS NOTE Kathleen Valenzuela  FFM:[id number] DOB: 12/23/1950 DOA: 08/25/2022 PCP: Monico Blitz, MD   Brief Narrative/Hospital Course: 72 y.o. female with  chronic anemia, anxiety, bipolar 1 disorder, COPD, GERD, heart murmur, hyperlipidemia, hypertension, and more presented to ED with a chief complaint of altered mental status.  Unfortunately patient is not able to provide history due to her dementia.  Her son reported to staff that he had spoke to her and she was at her normal baseline.  Later her SNF called and reported that she was altered and coming to the ER.  He reports that she has had some episodes of emesis.  No further history could be obtained.  Son reports this is not patient's baseline.  In the ED they treated her for sepsis, identified the source is acute cholecystitis and used cefepime and Flagyl for antibiotics as patient has a penicillin allergy.  She received fluids.  With these treatments patient did start to improve.  Pt discovered after work up to have acute acalculous cholecystitis.  Dr. Arnoldo Morale evaluated patient and determined next best step is to have cholecystostomy tube placed.  IR requested patient transfer to [facility] to have tube placement, patient admitted under hospitalist service however on 08/30/2022, patient went into respiratory distress, placed on BiPAP which she could not tolerate, PCCM was consulted and she was transferred to [facility], she was fluid overloaded, she was diuresed in the ICU, she was weaned to room air and subsequently was transferred back under [facility] on 09/01/2022   Briefly admitted w/ acute cholecystitis and had a drain placed in IR. Developed Respiratory Distress, unsure if it was aspiration pneumonia or pneumonia or volume overolad>Sent to ICU, placed on BiPAP and precedex. received diuresis. Now off BiPAP and off Precedex, on Nasal Canula. Tolerating well.  Medically stable and back to [facility] 12/28  Continues to have persistent leukocytosis underwent gallbladder  ultrasound showing CBD dilatation GI and surgeon involved again> had CT abdomen 12/31  Subjective: Seen and examined this morning alert awake pleasant not in distress, WBC count remains up further uptrending to 20.9.  Afebrile overnight.   Some pain on the site of the biliary drain   Assessment and Plan: Principal Problem:   Acute cholecystitis Active Problems:   Bipolar disorder (HCC)   High cholesterol   GERD (gastroesophageal reflux disease)   COPD (chronic obstructive pulmonary disease) (HCC)   Essential hypertension   Diastolic dysfunction with heart failure (HCC)   Atrial fibrillation, chronic (HCC)   Dementia with behavioral disturbance (HCC)   Acute metabolic encephalopathy   Sepsis ([place])    Sepsis POA due to acute cholecystitis Persistent leukocytosis: Had extensive workup with CT abdomen/pelvis, HIDA scan and MRCP.  Seen by [facility]/ IR and transferred from a [facility] to [facility] cholecystostomy - done on 08/27/2022.  Blood culture no growth urine culture insignificant growth.  On cefepime, Flagyl (abx started 12/21).  Having persistent leukocytosis but no fever> normal lipase, LFTs.  Discussed with surgery and GI team, S/P ultrasound as well as CT abdomen 12/31 " Interval development of a 4.1 x 1.8 cm right anterolateral abscess formation. Likely second abscess forming measuring 1.9 x 1.6 cm in the region of the right adnexa.Cholecystostomy tube in good position with decompressed gallbladder lumen."  Had discussed with general surgery team Brooke/attending> reviewed the CT scan failed to small to be drained, less likely to have leukocytosis, no intraperitoneal free fluid or free gas.  Await further GI input.  She is on broad-spectrum antibiotics cefepime and  Flagyl, trend CBC, monitor temperature curve Previous hospitalist discussed with Dr. Arnoldo Morale via secure chat-patient can go home with the drain whenever medically stable, follow-up with Dr. Arnoldo Morale in 2 weeks (call office to schedule  appointment), IR to follow drain and Eliquis was resumed after discussion. Recent Labs  Lab 09/02/22 0948 09/03/22 0301 09/03/22 1809 09/04/22 0240 09/05/22 0247  WBC 14.1* 16.1* 16.1* 19.8* 20.9*    Acute hypoxic failure due to volume overload/pulmonary edema/acute on chronic diastolic CHF: Still on 3l [place] and gets hypoxic when off oxygen.Initially needing BiPAP at this time doing well.  Continue supple oxygen, continue PT OT incentive spirometry.  Received IV Lasix 40 mg 12/28.  Reassess daily   Acute metabolic encephalopathy: Likely from sepsis with underlying dementia.  He appears alert awake oriented x 2, pleasant conversant.Cont on delirium precaution Dementia with behavioral disturbances Bipolar disorder: Mood stable this is alert awake oriented x 2.Continue Namenda/Aricept, Cogentin, Wellbutrin trazodone Effexor.  Continue delirium precaution supportive care fall precaution  Acute on chronic normocytic anemia: Hemoglobin stable > trend as below. Recent Labs    09/02/22 0948 09/03/22 0301 09/03/22 1809 09/04/22 0240 09/05/22 0247  HGB 11.7* 10.9* 11.6* 11.1* 11.5*  MCV 88.6 89.3 91.0 91.3 90.2   Thrombocytopenia resolved. Chronic atrial fibrillation continue Eliquis and metoprolol. Cont to monitor in telemetry Hypokalemia resolved GERD:Continue PPI Concern for aspiration : speech input appreciated diet changed to dysphagia 1   DVT prophylaxis: SCDs Start: 08/26/22 0137 Code Status:   Code Status: Full Code Family Communication: plan of care discussed with patient at bedside. Son visited previously [place] but no answer 12/31  Patient status is: Inpatient because of pending placement Level of care: Telemetry Medical   Dispo: The patient is from: home            Anticipated disposition: SNF once wbc better Objective: Vitals last 24 hrs: Vitals:   09/04/22 0752 09/04/22 1934 09/05/22 0431 09/05/22 1017  BP: 133/70 137/62 118/74 (!) 143/72  Pulse: 78 80 80 79   Resp: '18 19 20 18  '$ Temp: 98.9 F (37.2 C) 98.1 F (36.7 C) 98 F (36.7 C) 98.5 F (36.9 C)  TempSrc: Oral Oral Oral Oral  SpO2: 100% 98% 99% 99%  Weight:      Height:       Weight change:   Physical Examination: General exam: AAox2, weak,older appearing HEENT:Oral mucosa moist, Ear/Nose WNL grossly, dentition normal. Respiratory system: bilaterally clear BS, no use of accessory muscle Cardiovascular system: S1 & S2 +, regular rate. Gastrointestinal system: Abdomen soft, mildly site of the cholecystostomy tube soft abdomen overall BS+ Nervous System:Alert, awake, moving extremities and grossly nonfocal Extremities: LE ankle edema neg, lower extremities warm Skin: No rashes,no icterus. MSK: Normal muscle bulk,tone, power   Medications reviewed:  Scheduled Meds:  apixaban  5 mg Oral BID   atorvastatin  40 mg Oral QHS   benztropine  0.5 mg Oral BID   buPROPion  150 mg Oral q morning   Chlorhexidine Gluconate Cloth  6 each Topical Daily   donepezil  10 mg Oral Q2000   feeding supplement  237 mL Oral BID BM   fluticasone  1 spray Each Nare Daily   fluticasone furoate-vilanterol  1 puff Inhalation Daily   gabapentin  300 mg Oral TID   isosorbide mononitrate  15 mg Oral Daily   lurasidone  80 mg Oral QHS   memantine  10 mg Oral BID   metoprolol succinate  12.5 mg Oral  Daily   multivitamin with minerals  1 tablet Oral Daily   pantoprazole  40 mg Oral BID AC   sodium chloride flush  5 mL Intracatheter Q8H   tiZANidine  2 mg Oral BID   traZODone  100 mg Oral QHS   venlafaxine XR  300 mg Oral Daily   Continuous Infusions:  sodium chloride 50 mL/hr at 09/04/22 1355   ceFEPime (MAXIPIME) IV 2 g (09/05/22 0502)   metronidazole 500 mg (09/04/22 2321)   Diet Order             DIET - DYS 1 Room service appropriate? Yes; Fluid consistency: Thin  Diet effective now                  Intake/Output Summary (Last 24 hours) at 09/05/2022 1056 Last data filed at 09/05/2022  0600 Gross per 24 hour  Intake 845.3 ml  Output 1935 ml  Net -1089.7 ml    Net IO Since Admission: 918.7 mL [09/05/22 1056]  Wt Readings from Last 3 Encounters:  09/03/22 78.6 kg  04/18/22 81.6 kg  04/03/22 81.6 kg     Unresulted Labs (From admission, onward)     Start     Ordered   09/05/22 0500  Comprehensive metabolic panel  Daily,   R     Question:  Specimen collection method  Answer:  Lab=Lab collect   09/04/22 1031   09/03/22 0500  CBC  Daily,   R     Question:  Specimen collection method  Answer:  Lab=Lab collect   09/02/22 0922          Data Reviewed: I have personally reviewed following labs and imaging studies CBC: Recent Labs  Lab 08/30/22 0350 08/31/22 0433 09/01/22 1256 09/02/22 0948 09/03/22 0301 09/03/22 1809 09/04/22 0240 09/05/22 0247  WBC 16.5* 18.5* 18.1* 14.1* 16.1* 16.1* 19.8* 20.9*  NEUTROABS 12.9* 14.3* 13.1*  --   --   --   --   --   HGB 11.1* 11.5* 10.4* 11.7* 10.9* 11.6* 11.1* 11.5*  HCT 33.6* 33.0* 31.3* 35.0* 32.7* 34.5* 34.5* 35.1*  MCV 91.3 87.8 89.4 88.6 89.3 91.0 91.3 90.2  PLT 214 262 294 358 389 399 416* 420*    Basic Metabolic Panel: Recent Labs  Lab 08/30/22 0350 08/31/22 0433 09/01/22 1256 09/03/22 0301 09/03/22 1528 09/03/22 1809 09/04/22 0240 09/05/22 0247  NA 142   < > 138 139  --  136 138 136  K 3.6   < > 3.5 2.9* 4.1 3.9 3.7 3.8  CL 116*   < > 112* 113*  --  111 108 105  CO2 19*   < > 19* 21*  --  20* 20* 20*  GLUCOSE 95   < > 98 127*  --  127* 126* 113*  BUN 10   < > 11 8  --  7* 7* 9  CREATININE 0.76   < > 0.72 0.83  --  0.77 0.77 0.78  CALCIUM 8.3*   < > 8.4* 8.6*  --  8.8* 9.0 9.2  MG 1.9  --   --  1.8  --   --   --   --   PHOS  --   --   --  3.1  --   --   --   --    < > = values in this interval not displayed.    GFR: Estimated Creatinine Clearance: 70.7 mL/min (by C-G formula based on SCr of 0.78  mg/dL). Liver Function Tests: Recent Labs  Lab 08/30/22 0350 08/31/22 0433 09/04/22 0926  09/05/22 0247  AST '15 19 25 '$ 37  ALT '11 13 13 17  '$ ALKPHOS 65 61 60 64  BILITOT 0.8 1.1 0.1* 0.4  PROT 6.1* 6.4* 6.4* 6.4*  ALBUMIN 2.3* 2.3* 2.3* 2.4*    Recent Labs  Lab 09/04/22 0110 09/04/22 0519 09/04/22 0756 09/04/22 1234 09/04/22 1726  GLUCAP 122* 120* 113* 115* 106*    Recent Results (from the past 240 hour(s))  MRSA Next Gen by PCR, Nasal     Status: None   Collection Time: 08/30/22  1:29 PM   Specimen: Nasal Mucosa; Nasal Swab  Result Value Ref Range Status   MRSA by PCR Next Gen NOT DETECTED NOT DETECTED Final    Comment: (NOTE) The GeneXpert MRSA Assay (FDA approved for NASAL specimens only), is one component of a comprehensive MRSA colonization surveillance program. It is not intended to diagnose MRSA infection nor to guide or monitor treatment for MRSA infections. Test performance is not FDA approved in patients less than 53 years old. Performed at [facility], [place] [address] [place], [place] [address]     Antimicrobials: Anti-infectives (From admission, onward)    Start     Dose/Rate Route Frequency Ordered Stop   08/29/22 1400  ceFEPIme (MAXIPIME) 2 g in sodium chloride 0.9 % 100 mL IVPB        2 g 200 mL/hr over 30 Minutes Intravenous Every 8 hours 08/29/22 0750     08/26/22 1800  ceFEPIme (MAXIPIME) 2 g in sodium chloride 0.9 % 100 mL IVPB  Status:  Discontinued        2 g 200 mL/hr over 30 Minutes Intravenous Every 12 hours 08/26/22 0756 08/29/22 0750   08/26/22 1200  metroNIDAZOLE (FLAGYL) IVPB 500 mg        500 mg 100 mL/hr over 60 Minutes Intravenous Every 12 hours 08/26/22 0136     08/26/22 0600  ceFEPIme (MAXIPIME) 2 g in sodium chloride 0.9 % 100 mL IVPB  Status:  Discontinued        2 g 200 mL/hr over 30 Minutes Intravenous Every 8 hours 08/26/22 0013 08/26/22 0756   08/25/22 2300  ceFEPIme (MAXIPIME) 2 g in sodium chloride 0.9 % 100 mL IVPB       See Hyperspace for full Linked Orders Report.   2 g 200 mL/hr over 30 Minutes  Intravenous  Once 08/25/22 2252 08/26/22 0030   08/25/22 2300  metroNIDAZOLE (FLAGYL) IVPB 500 mg       See Hyperspace for full Linked Orders Report.   500 mg 100 mL/hr over 60 Minutes Intravenous  Once 08/25/22 2252 08/26/22 0136      Culture/Microbiology    Component Value Date/Time   SDES  08/25/2022 2124    URINE, CLEAN CATCH Performed at [facility], [address], [place], [place] [address]    [facility]  08/25/2022 2124    NONE Performed at [facility], [address] [place], [place] [address]    CULT (A) 08/25/2022 2124    <10,000 COLONIES/mL INSIGNIFICANT GROWTH Performed at [facility] [address] [place], [place] [address]    REPTSTATUS 08/27/2022 FINAL 08/25/2022 2124    Radiology Studies: CT ABDOMEN PELVIS W CONTRAST  Result Date: 09/04/2022 CLINICAL DATA:  RLQ abdominal pain RUQ pain, assess placement of perc chole drain, persistent leukocytosis EXAM: CT ABDOMEN AND PELVIS WITH CONTRAST TECHNIQUE: Multidetector CT imaging of  the abdomen and pelvis was performed using the standard protocol following bolus administration of intravenous contrast. RADIATION DOSE REDUCTION: This exam was performed according to the departmental dose-optimization program which includes automated exposure control, adjustment of the mA and/or kV according to patient size and/or use of iterative reconstruction technique. CONTRAST:  63m OMNIPAQUE IOHEXOL 350 MG/ML SOLN COMPARISON:  CT chest abdomen pelvis 08/25/2022 FINDINGS: Lower chest: Bilateral trace pleural effusions. Associated passive atelectasis of the bilateral lower lobes. Patchy peribronchovascular airspace opacities within the lingula. Coronary artery calcification and mitral annular calcification. Hepatobiliary: No focal liver abnormality. Cholecystostomy tube terminates within a decompressed gallbladder lumen. No CT evidence of gallstones, gallbladder wall thickening, or pericholecystic fluid. No biliary dilatation.  Pancreas: No focal lesion. Normal pancreatic contour. No surrounding inflammatory changes. No main pancreatic ductal dilatation. Spleen: Similar-appearing 1.7 cm hypodense lesion within the spleen clearance the spleen is normal size. Adrenals/Urinary Tract: No adrenal nodule bilaterally. Bilateral kidneys enhance symmetrically. Subcentimeter hypodensities too small to characterize-no further follow-up indicated. No hydronephrosis. No hydroureter. The urinary bladder is unremarkable. On delayed imaging, there is no urothelial wall thickening and there are no filling defects in the opacified portions of the bilateral collecting systems or ureters. Stomach/Bowel: PO contrast reaches the large bowel. Stomach is within normal limits. No evidence of bowel wall thickening or dilatation. Diffuse sigmoid and otherwise scattered colonic diverticulosis. Appendix appears normal. Vascular/Lymphatic: No abdominal aorta or iliac aneurysm. Severe atherosclerotic plaque of the aorta and its branches. No abdominal, pelvic, or inguinal lymphadenopathy. Reproductive: Uterus and bilateral adnexa are unremarkable. Other: No intraperitoneal free fluid. No intraperitoneal free gas. Interval development of a 4.1 x 1.8 cm right anterolateral organized fluid collection with mild peripheral enhancement. Likely second organized fluid collection developing within the right adnexal region and measuring 1.9 x 1.6 cm. Possible developing fluid collection along the rectouterine cholestatic. Musculoskeletal: No abdominal wall hernia or abnormality. No suspicious lytic or blastic osseous lesions. No acute displaced fracture. IMPRESSION: 1. Patchy peribronchovascular airspace opacities within the lingula. Findings suggest infection/inflammation. 2. Bilateral trace pleural effusions. 3. Interval development of a 4.1 x 1.8 cm right anterolateral abscess formation. Likely second abscess forming measuring 1.9 x 1.6 cm in the region of the right adnexa. 4.  Cholecystostomy tube in good position with decompressed gallbladder lumen. 5. Colonic diverticulosis with no acute diverticulitis. 6. Aortic Atherosclerosis (ICD10-I70.0) with coronary artery and mitral annular calcification. Electronically Signed   By: MIven FinnM.D.   On: 09/04/2022 23:56   [place]ABDOMEN LIMITED RUQ (LIVER/GB)  Result Date: 09/04/2022 CLINICAL DATA:  Leukocytosis. Status post percutaneous cholecystostomy for cholecystitis. EXAM: ULTRASOUND ABDOMEN LIMITED COMPARISON:  08/26/2022. FINDINGS: No focal hepatic parenchymal abnormalities. No biliary ductal dilatation identified. The CBD was 12 mm. The gallbladder was not visualized. IMPRESSION: CBD dilatation. Gallbladder not visualized. No hepatic parenchymal abnormalities. Electronically Signed   By: JSammie BenchM.D.   On: 09/04/2022 11:56     LOS: 11 days   RAntonieta Pert MD [facility]  09/05/2022, 10:56 AM

## 2022-09-05 NOTE — Progress Notes (Signed)
Mobility Specialist Progress Note   09/05/22 1148  Mobility  Activity Transferred to/from [facility]  Level of Assistance Minimal assist, patient does 75% or more  Assistive Device Front wheel walker  Distance Ambulated (ft) 4 ft  Activity Response Tolerated well  $Mobility charge 1 Mobility   Pre Mobility: 71 HR, 122/59 BP Post Mobility: 75 HR, 127/65 BP  Received pt in bed c/o RUQ pain(5/10) but agreeable to mobility. EOB w/ minA by pulling self up to a seated position using HHA. CGA to stand but pt immediately having BM, transferred to [facility] where pt was successful producing more. Pt able to stand for ~96mns for pericare then returned back to bed w/o fault. Call bell in reach and bed alarm on.   JHolland FallingMobility Specialist Please contact via SecureChat or  Rehab office at [phone]

## 2022-09-05 NOTE — Plan of Care (Signed)
  Problem: Education: Goal: Knowledge of General Education information will improve Description: Including pain rating scale, medication(s)/side effects and non-pharmacologic comfort measures Outcome: Not Progressing   Problem: Health Behavior/Discharge Planning: Goal: Ability to manage health-related needs will improve Outcome: Not Progressing   Problem: Clinical Measurements: Goal: Ability to maintain clinical measurements within normal limits will improve Outcome: Not Progressing Goal: Will remain free from infection Outcome: Not Progressing Goal: Diagnostic test results will improve Outcome: Not Progressing Goal: Respiratory complications will improve Outcome: Not Progressing Goal: Cardiovascular complication will be avoided Outcome: Not Progressing   Problem: Nutrition: Goal: Adequate nutrition will be maintained Outcome: Not Progressing   Problem: Coping: Goal: Level of anxiety will decrease Outcome: Not Progressing   Problem: Elimination: Goal: Will not experience complications related to bowel motility Outcome: Not Progressing Goal: Will not experience complications related to urinary retention Outcome: Not Progressing   

## 2022-09-05 NOTE — Progress Notes (Signed)
Mobility Specialist Progress Note   09/05/22 1045  Mobility  Activity Turned to left side;Turned to right side;Turned to back - supine  Level of Assistance +2 (takes two people)  Assistive Device Other (Comment) (HHA)  Activity Response Tolerated well  $Mobility charge 1 Mobility   RN requesting assistance for pericare after incontinent BM. Pt able to roll from side to side w/ minA and slight c/o pain from drain site but tolerable. Left cleaned w/ call bell in reach.   Holland Falling Mobility Specialist Please contact via SecureChat or  Rehab office at [phone]

## 2022-09-05 NOTE — Consult Note (Signed)
[facility] Surgery Consult Note  Kathleen Valenzuela [birth year]  [id number].    Requesting MD: Antonieta Pert Chief Complaint/Reason for Consult: cholecystitis   HPI:  Kathleen Valenzuela is a 72 y.o. female with multiple medical issues including COPD, bipolar, dementia, a fib on eliquis who was admitted to [facility] 08/26/22 from SNF due to worsening AMS, fever, nausea, and vomiting. She was found to have acute cholecystitis. Dr. Arnoldo Morale with general surgery saw her and recommended IR percutaneous cholecystostomy tube placement; this was performed on 08/27/22. She is currently on maxipime and flagyl. She did have an MRCP 12/22 that reports mild diffuse central intrahepatic biliary ductal dilatation with dilated common bile duct (13 mm diameter) with solitary 4 mm intermediate T2 signal intensity filling defect in the lower third of the CBD, compatible with either a stone or sludge, no enhancing biliary masses.  Patient went into acute respiratory distress 12/26 and was transferred to [facility] for management by CCM.  Patient has continued to complain of RUQ abdominal pain and she has had a persistent leukocytosis, therefore CT a/p was performed yesterday and shows cholecystostomy tube in good position with decompressed gallbladder lumen, interval development of a 4.1 x 1.8 cm right anterolateral abscess and likely second abscess forming measuring 1.9 x 1.6 cm in the region of the right adnexa; patchy peribronchovascular airspace opacities within the lingula suggesting infection/inflammation; bilateral trace pleural effusions. GI has seen who does recommend ERCP at this time, they may consider EUS in the next week for further evaluation. General surgery also asked to see given CT scan findings.  Patient states that her abdominal pain is unchanged. It is mostly located in the RUQ and comes and goes. Worse with palpation. Denies n/v. She is tolerating a diet and PO intake does not cause worsening abdominal  pain.  History reviewed. No pertinent family history.  Past Medical History:  Diagnosis Date   Anemia    Anxiety    Aortic atherosclerosis ([place]) 04/21/2017   Arthritis    Bipolar 1 disorder (HCC)    Chronic back pain    COPD (chronic obstructive pulmonary disease) (HCC)    Diastolic dysfunction with heart failure ([place]) 04/20/2017   Duodenal stricture    Early onset Alzheimer's dementia (HCC)    GERD (gastroesophageal reflux disease)    Heart murmur    High cholesterol    Hypertension    Shortness of breath dyspnea     Past Surgical History:  Procedure Laterality Date   BIOPSY  01/06/2017   Procedure: BIOPSY;  Surgeon: Rogene Houston, MD;  [facility];  Service: Endoscopy;;  esophageal biopsy   CESAREAN SECTION     COLONOSCOPY     ESOPHAGEAL DILATION N/A 10/29/2015   Procedure: ESOPHAGEAL DILATION;  Surgeon: Rogene Houston, MD;  [facility];  Service: Endoscopy;  Laterality: N/A;   ESOPHAGEAL DILATION N/A 01/29/2016   Procedure: ESOPHAGEAL DILATION;  Surgeon: Rogene Houston, MD;  [facility];  Service: Endoscopy;  Laterality: N/A;   ESOPHAGEAL DILATION N/A 01/06/2017   Procedure: ESOPHAGEAL DILATION;  Surgeon: Rogene Houston, MD;  [facility];  Service: Endoscopy;  Laterality: N/A;   ESOPHAGOGASTRODUODENOSCOPY N/A 10/29/2015   Procedure: ESOPHAGOGASTRODUODENOSCOPY (EGD);  Surgeon: Rogene Houston, MD;  [facility];  Service: Endoscopy;  Laterality: N/A;  1200   ESOPHAGOGASTRODUODENOSCOPY (EGD) WITH PROPOFOL N/A 01/29/2016   Procedure: ESOPHAGOGASTRODUODENOSCOPY (EGD) WITH PROPOFOL;  Surgeon: Rogene Houston, MD;  [facility]  [facility];  Service: Endoscopy;  Laterality: N/A;  7:30 - moved to 4/21 '@11'$ : 25 - Ann notified pt to arrive at 10:00   ESOPHAGOGASTRODUODENOSCOPY (EGD) WITH PROPOFOL N/A 01/06/2017   Procedure: ESOPHAGOGASTRODUODENOSCOPY (EGD) WITH PROPOFOL;  Surgeon: Rogene Houston, MD;  [facility];   Service: Endoscopy;  Laterality: N/A;  11:20   ESOPHAGOGASTRODUODENOSCOPY (EGD) WITH PROPOFOL N/A 02/17/2017   Procedure: ESOPHAGOGASTRODUODENOSCOPY (EGD) WITH PROPOFOL;  Surgeon: Rogene Houston, MD;  [facility];  Service: Endoscopy;  Laterality: N/A;  10:30   FOOT SURGERY Right    bunionectomy   HEMORRHOID SURGERY     IR PERC CHOLECYSTOSTOMY  08/27/2022    Social History:  reports that she quit smoking about 7 years ago. Her smoking use included cigarettes. She has a 70.00 pack-year smoking history. She has never used smokeless tobacco. She reports that she does not drink alcohol and does not use drugs.  Allergies:  Allergies  Allergen Reactions   Penicillins Rash    Has patient had a PCN reaction causing immediate rash, facial/tongue/throat swelling, SOB or lightheadedness with hypotension: Yes Has patient had a PCN reaction causing severe rash involving mucus membranes or skin necrosis: No Has patient had a PCN reaction that required hospitalization: No Has patient had a PCN reaction occurring within the last 10 years: No If all of the above answers are "NO", then may proceed with Cephalosporin use. Ceftriaxone ok    Medications Prior to Admission  Medication Sig Dispense Refill   acetaminophen (TYLENOL) 650 MG CR tablet Take 650 mg by mouth every 8 (eight) hours as needed for pain.     albuterol (PROVENTIL HFA;VENTOLIN HFA) 108 (90 BASE) MCG/ACT inhaler Inhale 2 puffs into the lungs 4 (four) times daily. (0800, 1200, 1600, & 2000)     ALPRAZolam (XANAX) 1 MG tablet Take 0.5 tablets (0.5 mg total) by mouth 3 (three) times daily as needed for anxiety. (0800, 1400, & 2000) (Patient taking differently: Take 0.5 mg by mouth 2 (two) times daily. (0800 & 2000)) 1 tablet 0   apixaban (ELIQUIS) 5 MG TABS tablet Take 1 tablet (5 mg total) by mouth 2 (two) times daily. 60 tablet 1   atorvastatin (LIPITOR) 10 MG tablet Take 1 tablet (10 mg total) by mouth daily at 8 pm. Resume on  04/22/20 if LFTs are back to normal (Patient taking differently: Take 40 mg by mouth daily at 8 pm.) 30 tablet 0   benztropine (COGENTIN) 1 MG tablet Take 0.5 mg by mouth 2 (two) times daily.     buPROPion (WELLBUTRIN XL) 150 MG 24 hr tablet Take 150 mg by mouth every morning.     donepezil (ARICEPT) 10 MG tablet Take 10 mg by mouth daily at 8 pm.     doxycycline (VIBRAMYCIN) 100 MG capsule Take 100 mg by mouth daily.     Ferrous Gluconate 324 (37.5 Fe) MG TABS Take 324 mg by mouth 2 (two) times daily. (0800 & 2000)     fluticasone (FLONASE) 50 MCG/ACT nasal spray Place 1 spray into both nostrils daily. (0800)     Fluticasone Furoate-Vilanterol (BREO ELLIPTA) 200-25 MCG/INH AEPB Inhale 1 puff into the lungs daily. (0800)     furosemide (LASIX) 40 MG tablet Take 40 mg by mouth daily. (0800)     gabapentin (NEURONTIN) 300 MG capsule Take 300 mg by mouth 3 (three) times daily.     hydrocortisone 1 % ointment Apply 1 Application topically 3 (three) times  daily.     isosorbide mononitrate (IMDUR) 30 MG 24 hr tablet Take 30 mg by mouth daily. (0800)     LATUDA 80 MG TABS tablet Take 40 mg by mouth at bedtime.     memantine (NAMENDA) 10 MG tablet Take 10 mg by mouth 2 (two) times daily.     Menthol-Zinc Oxide (RISAMINE) 0.44-20.625 % OINT Apply 1 application topically 4 (four) times daily as needed (for rash/redness).     metoprolol succinate (TOPROL-XL) 50 MG 24 hr tablet Take 50 mg by mouth daily. (0800)Take with or immediately following a meal.     Olopatadine HCl (PATADAY) 0.2 % SOLN Place 1 drop into both eyes daily. (0800)     pantoprazole (PROTONIX) 40 MG tablet Take 1 tablet (40 mg total) by mouth 2 (two) times daily before a meal. 60 tablet 2   potassium chloride (KLOR-CON) 10 MEQ tablet Take 1 tablet by mouth daily.     tiZANidine (ZANAFLEX) 2 MG tablet Take 2 mg by mouth 2 (two) times daily. (0800 & 2000)     traZODone (DESYREL) 100 MG tablet Take 100 mg by mouth at bedtime.     venlafaxine  XR (EFFEXOR-XR) 150 MG 24 hr capsule Take 300 mg by mouth daily.      Prior to Admission medications   Medication Sig Start Date End Date Taking? Authorizing Provider  acetaminophen (TYLENOL) 650 MG CR tablet Take 650 mg by mouth every 8 (eight) hours as needed for pain.   Yes [provider]  albuterol (PROVENTIL HFA;VENTOLIN HFA) 108 (90 BASE) MCG/ACT inhaler Inhale 2 puffs into the lungs 4 (four) times daily. (0800, 1200, 1600, & 2000)   Yes [provider]  ALPRAZolam (XANAX) 1 MG tablet Take 0.5 tablets (0.5 mg total) by mouth 3 (three) times daily as needed for anxiety. (0800, 1400, & 2000) Patient taking differently: Take 0.5 mg by mouth 2 (two) times daily. (0800 & 2000) 04/24/17  Yes Rosita Fire, MD  apixaban (ELIQUIS) 5 MG TABS tablet Take 1 tablet (5 mg total) by mouth 2 (two) times daily. 04/15/20  Yes Tat, Shanon Brow, MD  atorvastatin (LIPITOR) 10 MG tablet Take 1 tablet (10 mg total) by mouth daily at 8 pm. Resume on 04/22/20 if LFTs are back to normal Patient taking differently: Take 40 mg by mouth daily at 8 pm. 04/15/20  Yes Tat, Shanon Brow, MD  benztropine (COGENTIN) 1 MG tablet Take 0.5 mg by mouth 2 (two) times daily. 04/10/20  Yes [provider]  buPROPion (WELLBUTRIN XL) 150 MG 24 hr tablet Take 150 mg by mouth every morning. 03/11/20  Yes [provider]  donepezil (ARICEPT) 10 MG tablet Take 10 mg by mouth daily at 8 pm.   Yes [provider]  doxycycline (VIBRAMYCIN) 100 MG capsule Take 100 mg by mouth daily. 08/09/22  Yes [provider]  Ferrous Gluconate 324 (37.5 Fe) MG TABS Take 324 mg by mouth 2 (two) times daily. (0800 & 2000)   Yes [provider]  fluticasone (FLONASE) 50 MCG/ACT nasal spray Place 1 spray into both nostrils daily. (0800)   Yes [provider]  Fluticasone Furoate-Vilanterol (BREO ELLIPTA) 200-25 MCG/INH AEPB Inhale 1 puff into the lungs daily. (0800)   Yes [provider]   furosemide (LASIX) 40 MG tablet Take 40 mg by mouth daily. (0800)   Yes [provider]  gabapentin (NEURONTIN) 300 MG capsule Take 300 mg by mouth 3 (three) times daily. 11/14/19  Yes  [provider]  hydrocortisone 1 % ointment Apply 1 Application topically 3 (three) times daily. 06/21/22  Yes [provider]  isosorbide mononitrate (IMDUR) 30 MG 24 hr tablet Take 30 mg by mouth daily. (0800)   Yes [provider]  LATUDA 80 MG TABS tablet Take 40 mg by mouth at bedtime. 11/26/19  Yes [provider]  memantine (NAMENDA) 10 MG tablet Take 10 mg by mouth 2 (two) times daily. 04/10/20  Yes [provider]  Menthol-Zinc Oxide (RISAMINE) 0.44-20.625 % OINT Apply 1 application topically 4 (four) times daily as needed (for rash/redness).   Yes [provider]  metoprolol succinate (TOPROL-XL) 50 MG 24 hr tablet Take 50 mg by mouth daily. (0800)Take with or immediately following a meal.   Yes [provider]  Olopatadine HCl (PATADAY) 0.2 % SOLN Place 1 drop into both eyes daily. (0800)   Yes [provider]  pantoprazole (PROTONIX) 40 MG tablet Take 1 tablet (40 mg total) by mouth 2 (two) times daily before a meal. 02/17/17  Yes Rehman, Mechele Dawley, MD  potassium chloride (KLOR-CON) 10 MEQ tablet Take 1 tablet by mouth daily. 04/10/20  Yes [provider]  tiZANidine (ZANAFLEX) 2 MG tablet Take 2 mg by mouth 2 (two) times daily. (0800 & 2000) 12/01/16  Yes [provider]  traZODone (DESYREL) 100 MG tablet Take 100 mg by mouth at bedtime. 04/10/20  Yes [provider]  venlafaxine XR (EFFEXOR-XR) 150 MG 24 hr capsule Take 300 mg by mouth daily. 04/10/20  Yes [provider]    Blood pressure (!) 143/72, pulse 79, temperature 98.5 F (36.9 C), temperature source Oral, resp. rate 18, height '5\' 7"'$  (1.702 m), weight 78.6 kg, SpO2 99 %. Physical Exam: General: elderly female in NAD Heart: regular, rate,  and rhythm.  HR 70s Lungs: Respiratory effort nonlabored on [place] Abd: soft, ND, +BS, no masses, hernias, or organomegaly. TTP RUQ without rebound or guarding, RUQ perc chole tube present with serosanguinous fluid in bulb MS: calves soft and nontender without edema Skin: warm and dry with no masses, lesions, or rashes Neuro: MAEs, no gross motor or sensory deficits BUE/BLE  Results for orders placed or performed during the hospital encounter of 08/25/22 (from the past 48 hour(s))  Potassium     Status: None   Collection Time: 09/03/22  3:28 PM  Result Value Ref Range   Potassium 4.1 3.5 - 5.1 mmol/L    Comment: Performed at [facility], [place] [address] [place], [place] [address]  CBC     Status: Abnormal   Collection Time: 09/03/22  6:09 PM  Result Value Ref Range   WBC 16.1 (H) 4.0 - 10.5 K/uL   RBC 3.79 (L) 3.87 - 5.11 MIL/uL   Hemoglobin 11.6 (L) 12.0 - 15.0 g/dL   HCT 34.5 (L) 36.0 - 46.0 %   MCV 91.0 80.0 - 100.0 fL   MCH 30.6 26.0 - 34.0 pg   MCHC 33.6 30.0 - 36.0 g/dL   RDW 15.8 (H) 11.5 - 15.5 %   Platelets 399 150 - 400 K/uL   nRBC 0.0 0.0 - 0.2 %    Comment: Performed at [facility] [address], [place], [place] [address]  Basic metabolic panel     Status: Abnormal   Collection Time: 09/03/22  6:09 PM  Result Value Ref Range   Sodium 136 135 - 145 mmol/L   Potassium 3.9 3.5 - 5.1 mmol/L  Chloride 111 98 - 111 mmol/L   CO2 20 (L) 22 - 32 mmol/L   Glucose, Bld 127 (H) 70 - 99 mg/dL    Comment: Glucose reference range applies only to samples taken after fasting for at least 8 hours.   BUN 7 (L) 8 - 23 mg/dL   Creatinine, Ser 0.77 0.44 - 1.00 mg/dL   Calcium 8.8 (L) 8.9 - 10.3 mg/dL   GFR, Estimated >60 >60 mL/min    Comment: (NOTE) Calculated using the CKD-EPI Creatinine Equation (2021)    Anion gap 5 5 - 15    Comment: Performed at [facility] [address], [place], [place] [address]  Glucose, capillary     Status: Abnormal   Collection  Time: 09/03/22  8:41 PM  Result Value Ref Range   Glucose-Capillary 121 (H) 70 - 99 mg/dL    Comment: Glucose reference range applies only to samples taken after fasting for at least 8 hours.  Glucose, capillary     Status: Abnormal   Collection Time: 09/04/22  1:10 AM  Result Value Ref Range   Glucose-Capillary 122 (H) 70 - 99 mg/dL    Comment: Glucose reference range applies only to samples taken after fasting for at least 8 hours.  CBC     Status: Abnormal   Collection Time: 09/04/22  2:40 AM  Result Value Ref Range   WBC 19.8 (H) 4.0 - 10.5 K/uL   RBC 3.78 (L) 3.87 - 5.11 MIL/uL   Hemoglobin 11.1 (L) 12.0 - 15.0 g/dL   HCT 34.5 (L) 36.0 - 46.0 %   MCV 91.3 80.0 - 100.0 fL   MCH 29.4 26.0 - 34.0 pg   MCHC 32.2 30.0 - 36.0 g/dL   RDW 15.7 (H) 11.5 - 15.5 %   Platelets 416 (H) 150 - 400 K/uL   nRBC 0.0 0.0 - 0.2 %    Comment: Performed at [facility] [address], [place], [place] [address]  Basic metabolic panel     Status: Abnormal   Collection Time: 09/04/22  2:40 AM  Result Value Ref Range   Sodium 138 135 - 145 mmol/L   Potassium 3.7 3.5 - 5.1 mmol/L   Chloride 108 98 - 111 mmol/L   CO2 20 (L) 22 - 32 mmol/L   Glucose, Bld 126 (H) 70 - 99 mg/dL    Comment: Glucose reference range applies only to samples taken after fasting for at least 8 hours.   BUN 7 (L) 8 - 23 mg/dL   Creatinine, Ser 0.77 0.44 - 1.00 mg/dL   Calcium 9.0 8.9 - 10.3 mg/dL   GFR, Estimated >60 >60 mL/min    Comment: (NOTE) Calculated using the CKD-EPI Creatinine Equation (2021)    Anion gap 10 5 - 15    Comment: Performed at [facility] [address], [place], [place] [address]  Glucose, capillary     Status: Abnormal   Collection Time: 09/04/22  5:19 AM  Result Value Ref Range   Glucose-Capillary 120 (H) 70 - 99 mg/dL    Comment: Glucose reference range applies only to samples taken after fasting for at least 8 hours.  Glucose, capillary     Status: Abnormal   Collection Time:  09/04/22  7:56 AM  Result Value Ref Range   Glucose-Capillary 113 (H) 70 - 99 mg/dL    Comment: Glucose reference range applies only to samples taken after fasting for at least 8 hours.  Hepatic  function panel     Status: Abnormal   Collection Time: 09/04/22  9:26 AM  Result Value Ref Range   Total Protein 6.4 (L) 6.5 - 8.1 g/dL   Albumin 2.3 (L) 3.5 - 5.0 g/dL   AST 25 15 - 41 U/L   ALT 13 0 - 44 U/L   Alkaline Phosphatase 60 38 - 126 U/L   Total Bilirubin 0.1 (L) 0.3 - 1.2 mg/dL   Bilirubin, Direct <0.1 0.0 - 0.2 mg/dL   Indirect Bilirubin NOT CALCULATED 0.3 - 0.9 mg/dL    Comment: Performed at [facility] [address], [place], [place] [address]  Lipase, blood     Status: None   Collection Time: 09/04/22  9:26 AM  Result Value Ref Range   Lipase 44 11 - 51 U/L    Comment: Performed at [facility] [address] [place], [place] [address]  Glucose, capillary     Status: Abnormal   Collection Time: 09/04/22 12:34 PM  Result Value Ref Range   Glucose-Capillary 115 (H) 70 - 99 mg/dL    Comment: Glucose reference range applies only to samples taken after fasting for at least 8 hours.  Glucose, capillary     Status: Abnormal   Collection Time: 09/04/22  5:26 PM  Result Value Ref Range   Glucose-Capillary 106 (H) 70 - 99 mg/dL    Comment: Glucose reference range applies only to samples taken after fasting for at least 8 hours.  CBC     Status: Abnormal   Collection Time: 09/05/22  2:47 AM  Result Value Ref Range   WBC 20.9 (H) 4.0 - 10.5 K/uL   RBC 3.89 3.87 - 5.11 MIL/uL   Hemoglobin 11.5 (L) 12.0 - 15.0 g/dL   HCT 35.1 (L) 36.0 - 46.0 %   MCV 90.2 80.0 - 100.0 fL   MCH 29.6 26.0 - 34.0 pg   MCHC 32.8 30.0 - 36.0 g/dL   RDW 15.4 11.5 - 15.5 %   Platelets 420 (H) 150 - 400 K/uL   nRBC 0.0 0.0 - 0.2 %    Comment: Performed at [facility] [address] [place], [place] [address]  Comprehensive metabolic panel     Status: Abnormal   Collection Time:  09/05/22  2:47 AM  Result Value Ref Range   Sodium 136 135 - 145 mmol/L   Potassium 3.8 3.5 - 5.1 mmol/L   Chloride 105 98 - 111 mmol/L   CO2 20 (L) 22 - 32 mmol/L   Glucose, Bld 113 (H) 70 - 99 mg/dL    Comment: Glucose reference range applies only to samples taken after fasting for at least 8 hours.   BUN 9 8 - 23 mg/dL   Creatinine, Ser 0.78 0.44 - 1.00 mg/dL   Calcium 9.2 8.9 - 10.3 mg/dL   Total Protein 6.4 (L) 6.5 - 8.1 g/dL   Albumin 2.4 (L) 3.5 - 5.0 g/dL   AST 37 15 - 41 U/L   ALT 17 0 - 44 U/L   Alkaline Phosphatase 64 38 - 126 U/L   Total Bilirubin 0.4 0.3 - 1.2 mg/dL   GFR, Estimated >60 >60 mL/min    Comment: (NOTE) Calculated using the CKD-EPI Creatinine Equation (2021)    Anion gap 11 5 - 15    Comment: Performed at [facility], [place] [address] [place], [place] [address]   CT ABDOMEN PELVIS W CONTRAST  Result Date: 09/04/2022 CLINICAL  DATA:  RLQ abdominal pain RUQ pain, assess placement of perc chole drain, persistent leukocytosis EXAM: CT ABDOMEN AND PELVIS WITH CONTRAST TECHNIQUE: Multidetector CT imaging of the abdomen and pelvis was performed using the standard protocol following bolus administration of intravenous contrast. RADIATION DOSE REDUCTION: This exam was performed according to the departmental dose-optimization program which includes automated exposure control, adjustment of the mA and/or kV according to patient size and/or use of iterative reconstruction technique. CONTRAST:  2m OMNIPAQUE IOHEXOL 350 MG/ML SOLN COMPARISON:  CT chest abdomen pelvis 08/25/2022 FINDINGS: Lower chest: Bilateral trace pleural effusions. Associated passive atelectasis of the bilateral lower lobes. Patchy peribronchovascular airspace opacities within the lingula. Coronary artery calcification and mitral annular calcification. Hepatobiliary: No focal liver abnormality. Cholecystostomy tube terminates within a decompressed gallbladder lumen. No CT evidence of gallstones,  gallbladder wall thickening, or pericholecystic fluid. No biliary dilatation. Pancreas: No focal lesion. Normal pancreatic contour. No surrounding inflammatory changes. No main pancreatic ductal dilatation. Spleen: Similar-appearing 1.7 cm hypodense lesion within the spleen clearance the spleen is normal size. Adrenals/Urinary Tract: No adrenal nodule bilaterally. Bilateral kidneys enhance symmetrically. Subcentimeter hypodensities too small to characterize-no further follow-up indicated. No hydronephrosis. No hydroureter. The urinary bladder is unremarkable. On delayed imaging, there is no urothelial wall thickening and there are no filling defects in the opacified portions of the bilateral collecting systems or ureters. Stomach/Bowel: PO contrast reaches the large bowel. Stomach is within normal limits. No evidence of bowel wall thickening or dilatation. Diffuse sigmoid and otherwise scattered colonic diverticulosis. Appendix appears normal. Vascular/Lymphatic: No abdominal aorta or iliac aneurysm. Severe atherosclerotic plaque of the aorta and its branches. No abdominal, pelvic, or inguinal lymphadenopathy. Reproductive: Uterus and bilateral adnexa are unremarkable. Other: No intraperitoneal free fluid. No intraperitoneal free gas. Interval development of a 4.1 x 1.8 cm right anterolateral organized fluid collection with mild peripheral enhancement. Likely second organized fluid collection developing within the right adnexal region and measuring 1.9 x 1.6 cm. Possible developing fluid collection along the rectouterine cholestatic. Musculoskeletal: No abdominal wall hernia or abnormality. No suspicious lytic or blastic osseous lesions. No acute displaced fracture. IMPRESSION: 1. Patchy peribronchovascular airspace opacities within the lingula. Findings suggest infection/inflammation. 2. Bilateral trace pleural effusions. 3. Interval development of a 4.1 x 1.8 cm right anterolateral abscess formation. Likely second  abscess forming measuring 1.9 x 1.6 cm in the region of the right adnexa. 4. Cholecystostomy tube in good position with decompressed gallbladder lumen. 5. Colonic diverticulosis with no acute diverticulitis. 6. Aortic Atherosclerosis (ICD10-I70.0) with coronary artery and mitral annular calcification. Electronically Signed   By: MIven FinnM.D.   On: 09/04/2022 23:56   [place]ABDOM[place] LIMITED RUQ (LIVER/GB)  Result Date: 09/04/2022 CLINICAL DATA:  Leukocytosis. Status post percutaneous cholecystostomy for cholecystitis. EXAM: ULTRASOUND ABDOMEN LIMITED COMPARISON:  08/26/2022. FINDINGS: No focal hepatic parenchymal abnormalities. No biliary ductal dilatation identified. The CBD was 12 mm. The gallbladder was not visualized. IMPRESSION: CBD dilatation. Gallbladder not visualized. No hepatic parenchymal abnormalities. Electronically Signed   By: JSammie BenchM.D.   On: 09/04/2022 11:56      Assessment/Plan Acute cholecystitis S/p percutaneous cholecystostomy tube placement 08/27/22  ?Choledocholithiasis - MRCP 08/26/2022 showed a distended gallbladder with sludge and diffuse wall thickening with pericholecystic edema without gallstones, mild diffuse intrahepatic biliary ductal dilatation with a dilated CBD duct 13 mm with a possible stone in the lower third of the CBD. LFTs are WNL - GI is following and considering EUS this week for further evaluation once anticoagulation washes  out Persistent RUQ pain and leukocytosis - Asked to review CT a/p from yesterday given persistent RUQ abdominal pain and leukocytosis despite perc chole tube placement 12/23 for acute cholecystitis. CT scan shows perc chole tube to be in correct position; it also reports a small 4.1 x 1.8 cm right anterolateral fluid collection as well as possible developing fluid collection in the right adnexal region and measuring 1.9 x 1.6 cm.  Reviewed with MD. This fluid collection is small and we would not recommend perc drainage at  this time. It is not likely the source of her leukocytosis. She is on antibiotics for her cholecystitis which will likely help if this is a small abscess.  ID - currently maxipime and flagyl VTE - eliquis FEN - D1 diet Foley - none   I reviewed Consultant GI notes, hospitalist notes, last 24 h vitals and pain scores, last 48 h intake and output, last 24 h labs and trends, and last 24 h imaging results.   Wellington Hampshire, [facility] 09/05/2022, 12:33 PM Please see Amion for pager number during day hours 7:00am-4:30pm

## 2022-09-05 NOTE — Progress Notes (Signed)
IR procedure request Intra abdominal abscess drain placement.  72 y.o female inpatient. History of COPD, HLD, HTN, dementia. Admitted to [facility] for [place] on 12.22.23. Found to have acute cholecystitis. IR placed a cholecystomy tube on 12.23.23. Hospital stay complicated by ongoing leukocytosis. Team is requesting evaluation of CT abd  pelvis from 12.31.23 fore further evaluation of possible intra abdominal abscess.   Case/images reviewed by IR Attending Dr. Vernia Buff  area in question is not accessible for percutaneous access.Recommend Team medical manage. Should patient condition worsen recommend repeat CT. This was communicated directly to the Team via EPIC chat   Please call IR with questions/concerns.

## 2022-09-06 DIAGNOSIS — K810 Acute cholecystitis: Secondary | ICD-10-CM | POA: Diagnosis not present

## 2022-09-06 DIAGNOSIS — F03918 Unspecified dementia, unspecified severity, with other behavioral disturbance: Secondary | ICD-10-CM | POA: Diagnosis not present

## 2022-09-06 DIAGNOSIS — G9341 Metabolic encephalopathy: Secondary | ICD-10-CM | POA: Diagnosis not present

## 2022-09-06 LAB — COMPREHENSIVE METABOLIC PANEL
ALT: 21 U/L (ref 0–44)
AST: 33 U/L (ref 15–41)
Albumin: 2.4 g/dL — ABNORMAL LOW (ref 3.5–5.0)
Alkaline Phosphatase: 69 U/L (ref 38–126)
Anion gap: 10 (ref 5–15)
BUN: 10 mg/dL (ref 8–23)
CO2: 22 mmol/L (ref 22–32)
Calcium: 9.1 mg/dL (ref 8.9–10.3)
Chloride: 105 mmol/L (ref 98–111)
Creatinine, Ser: 0.85 mg/dL (ref 0.44–1.00)
GFR, Estimated: >60 mL/min (ref >60)
Glucose, Bld: 107 mg/dL — ABNORMAL HIGH (ref 70–99)
Potassium: 3.8 mmol/L (ref 3.5–5.1)
Sodium: 137 mmol/L (ref 135–145)
Total Bilirubin: 0.3 mg/dL (ref 0.3–1.2)
Total Protein: 6.6 g/dL (ref 6.5–8.1)

## 2022-09-06 LAB — GLUCOSE, CAPILLARY
Glucose-Capillary: 107 mg/dL — ABNORMAL HIGH (ref 70–99)
Glucose-Capillary: 113 mg/dL — ABNORMAL HIGH (ref 70–99)
Glucose-Capillary: 130 mg/dL — ABNORMAL HIGH (ref 70–99)

## 2022-09-06 LAB — CBC
HCT: 34.9 % — ABNORMAL LOW (ref 36.0–46.0)
Hemoglobin: 11.5 g/dL — ABNORMAL LOW (ref 12.0–15.0)
MCH: 29.8 pg (ref 26.0–34.0)
MCHC: 33 g/dL (ref 30.0–36.0)
MCV: 90.4 fL (ref 80.0–100.0)
Platelets: 431 10*3/uL — ABNORMAL HIGH (ref 150–400)
RBC: 3.86 MIL/uL — ABNORMAL LOW (ref 3.87–5.11)
RDW: 15.6 % — ABNORMAL HIGH (ref 11.5–15.5)
WBC: 21.2 10*3/uL — ABNORMAL HIGH (ref 4.0–10.5)
nRBC: 0 % (ref 0.0–0.2)

## 2022-09-06 MED ORDER — SODIUM CHLORIDE 0.9% FLUSH
10.0000 mL | Freq: Two times a day (BID) | INTRAVENOUS | Status: DC
  Administered 2022-09-06 – 2022-09-15 (×15): 10 mL

## 2022-09-06 MED ORDER — SODIUM CHLORIDE 0.9% FLUSH
10.0000 mL | INTRAVENOUS | Status: DC | PRN
  Administered 2022-09-15: 10 mL

## 2022-09-06 MED ORDER — ENSURE ENLIVE PO LIQD
237.0000 mL | Freq: Three times a day (TID) | ORAL | Status: DC
  Administered 2022-09-06 – 2022-09-16 (×25): 237 mL via ORAL

## 2022-09-06 NOTE — Progress Notes (Addendum)
Daily Rounding Note  09/06/2022, 3:27 PM  LOS: 12 days   SUBJECTIVE:   Chief complaint: choldocholithiasis?, non-obstructing CBD filling defects.  R abdominal pain     Still w R dide abdominal discomfort, not severe.  Dit limited to purees which pt does not like.  Eating very little.  Denies n/v.    OBJECTIVE:         Vital signs in last 24 hours:    Temp:  [98 F (36.7 C)-99.4 F (37.4 C)] 98 F (36.7 C) (01/02 0848) Pulse Rate:  [60-76] 74 (01/02 0848) Resp:  [15-18] 18 (01/02 0848) BP: (120-132)/(57-73) 120/57 (01/02 0848) SpO2:  [92 %-97 %] 97 % (01/02 0848) Last BM Date : 09/05/22 Filed Weights   08/30/22 1330 08/31/22 0600 09/03/22 0500  Weight: 85.4 kg 79.8 kg 78.6 kg   General: alert, pleasant, vomfortable   Heart: RRR Chest: clear bil.   Abdomen: soft, ND, active BS.  Slight discomfort RUQ where JP drain positioned.  Scant bloody drainage in tubing but bulb syringe is empty.    Extremities: No CCE.  Feet warm.   Neuro/Psych:  pleasant, follows commands.  No tremors.  Moves all 4s.  Oriented to self, place.  Overall slow responses.    Intake/Output from previous day: 01/01 0701 - 01/02 0700 In: 1510.4 [P.O.:240; I.V.:295; IV Piggyback:975.4] Out: 1235 [Urine:1200; Drains:35]  Intake/Output this shift: Total I/O In: 1280.6 [I.V.:1050.8; Other:5; IV Piggyback:224.8] Out: 15 [Drains:15]  Lab Results: Recent Labs    09/04/22 0240 09/05/22 0247 09/06/22 0422  WBC 19.8* 20.9* 21.2*  HGB 11.1* 11.5* 11.5*  HCT 34.5* 35.1* 34.9*  PLT 416* 420* 431*   BMET Recent Labs    09/04/22 0240 09/05/22 0247 09/06/22 0422  NA 138 136 137  K 3.7 3.8 3.8  CL 108 105 105  CO2 20* 20* 22  GLUCOSE 126* 113* 107*  BUN 7* 9 10  CREATININE 0.77 0.78 0.85  CALCIUM 9.0 9.2 9.1   LFT Recent Labs    09/04/22 0926 09/05/22 0247 09/06/22 0422  PROT 6.4* 6.4* 6.6  ALBUMIN 2.3* 2.4* 2.4*  AST 25 37 33   ALT '13 17 21  '$ ALKPHOS 60 64 69  BILITOT 0.1* 0.4 0.3  BILIDIR <0.1  --   --   IBILI NOT CALCULATED  --   --    PT/INR No results for input(s): "LABPROT", "INR" in the last 72 hours. Hepatitis Panel No results for input(s): "HEPBSAG", "HCVAB", "HEPAIGM", "HEPBIGM" in the last 72 hours.  Studies/Results: CT ABDOMEN PELVIS W CONTRAST  Result Date: 09/04/2022 CLINICAL DATA:  RLQ abdominal pain RUQ pain, assess placement of perc chole drain, persistent leukocytosis EXAM: CT ABDOMEN AND PELVIS WITH CONTRAST TECHNIQUE: Multidetector CT imaging of the abdomen and pelvis was performed using the standard protocol following bolus administration of intravenous contrast. RADIATION DOSE REDUCTION: This exam was performed according to the departmental dose-optimization program which includes automated exposure control, adjustment of the mA and/or kV according to patient size and/or use of iterative reconstruction technique. CONTRAST:  70m OMNIPAQUE IOHEXOL 350 MG/ML SOLN COMPARISON:  CT chest abdomen pelvis 08/25/2022 FINDINGS: Lower chest: Bilateral trace pleural effusions. Associated passive atelectasis of the bilateral lower lobes. Patchy peribronchovascular airspace opacities within the lingula. Coronary artery calcification and mitral annular calcification. Hepatobiliary: No focal liver abnormality. Cholecystostomy tube terminates within a decompressed gallbladder lumen. No CT evidence of gallstones, gallbladder wall thickening, or pericholecystic fluid. No biliary dilatation. Pancreas:  No focal lesion. Normal pancreatic contour. No surrounding inflammatory changes. No main pancreatic ductal dilatation. Spleen: Similar-appearing 1.7 cm hypodense lesion within the spleen clearance the spleen is normal size. Adrenals/Urinary Tract: No adrenal nodule bilaterally. Bilateral kidneys enhance symmetrically. Subcentimeter hypodensities too small to characterize-no further follow-up indicated. No hydronephrosis. No  hydroureter. The urinary bladder is unremarkable. On delayed imaging, there is no urothelial wall thickening and there are no filling defects in the opacified portions of the bilateral collecting systems or ureters. Stomach/Bowel: PO contrast reaches the large bowel. Stomach is within normal limits. No evidence of bowel wall thickening or dilatation. Diffuse sigmoid and otherwise scattered colonic diverticulosis. Appendix appears normal. Vascular/Lymphatic: No abdominal aorta or iliac aneurysm. Severe atherosclerotic plaque of the aorta and its branches. No abdominal, pelvic, or inguinal lymphadenopathy. Reproductive: Uterus and bilateral adnexa are unremarkable. Other: No intraperitoneal free fluid. No intraperitoneal free gas. Interval development of a 4.1 x 1.8 cm right anterolateral organized fluid collection with mild peripheral enhancement. Likely second organized fluid collection developing within the right adnexal region and measuring 1.9 x 1.6 cm. Possible developing fluid collection along the rectouterine cholestatic. Musculoskeletal: No abdominal wall hernia or abnormality. No suspicious lytic or blastic osseous lesions. No acute displaced fracture. IMPRESSION: 1. Patchy peribronchovascular airspace opacities within the lingula. Findings suggest infection/inflammation. 2. Bilateral trace pleural effusions. 3. Interval development of a 4.1 x 1.8 cm right anterolateral abscess formation. Likely second abscess forming measuring 1.9 x 1.6 cm in the region of the right adnexa. 4. Cholecystostomy tube in good position with decompressed gallbladder lumen. 5. Colonic diverticulosis with no acute diverticulitis. 6. Aortic Atherosclerosis (ICD10-I70.0) with coronary artery and mitral annular calcification. Electronically Signed   By: Iven Finn M.D.   On: 09/04/2022 23:56     Scheduled Meds:  apixaban  5 mg Oral BID   atorvastatin  40 mg Oral QHS   benztropine  0.5 mg Oral BID   buPROPion  150 mg Oral q  morning   Chlorhexidine Gluconate Cloth  6 each Topical Daily   donepezil  10 mg Oral Q2000   feeding supplement  237 mL Oral TID WC   fluticasone  1 spray Each Nare Daily   fluticasone furoate-vilanterol  1 puff Inhalation Daily   gabapentin  300 mg Oral TID   isosorbide mononitrate  15 mg Oral Daily   lurasidone  80 mg Oral QHS   memantine  10 mg Oral BID   metoprolol succinate  12.5 mg Oral Daily   multivitamin with minerals  1 tablet Oral Daily   pantoprazole  40 mg Oral BID AC   sodium chloride flush  10-40 mL Intracatheter Q12H   sodium chloride flush  5 mL Intracatheter Q8H   tiZANidine  2 mg Oral BID   traZODone  100 mg Oral QHS   venlafaxine XR  300 mg Oral Daily   Continuous Infusions:  ceFEPime (MAXIPIME) IV Stopped (09/06/22 1402)   metronidazole Stopped (09/06/22 1258)   PRN Meds:.albuterol, ALPRAZolam, guaiFENesin, iohexol, [DISCONTINUED] ondansetron **OR** ondansetron (ZOFRAN) IV, oxyCODONE, sodium chloride flush   ASSESMENT:    Filling defect CBD/?choledocholithiasis on [place] and MRCP.  HIDA scan revealed patent CBD.  LFTs have been normal.  Biliary specialist not recommending ERCP     Cholecystitis.  SP 08/27/2022 cholecystostomy tube placement.  Abscesses right anterior lateral position and right adnexa.  Surgery does not feel percutaneous drainage needed.  Continues on metronidazole, cefepime day 12.  Chronic apixaban, for a fib, not on hold.  PLAN   Continue abs as per surgery rec   Ran the case by our biliary specialist, Dr. Rush Landmark.  "Her bile duct was normal in recent CT. as such no further eus is needed at this time"     Kathleen Valenzuela  09/06/2022, 3:27 PM Phone [phone]

## 2022-09-06 NOTE — Plan of Care (Signed)

## 2022-09-06 NOTE — Progress Notes (Signed)
Pharmacy Antibiotic Note  Kathleen Valenzuela is a 72 y.o. female admitted on 08/25/2022 with sepsis and  acute cholecystitis .  Pharmacy has been consulted for cefepime dosing.  S/p cholecystotomy tube placement by IR on 08/27/2022, however continues to have persistent RUQ pain and leukocytosis. 12/31 CT A/P w/Likely second abscess. Per surgery, fluid collection is small and we would not recommend perc drainage at this time. Radiology reviewed and it is not amenable to drainage. Recommendation is to continue current antibiotics.  Plan: Continue cefepime 2 grams IV every 8 hours Continue Flagyl per MD Monitor clinical progress, cultures/sensitivities, renal function, abx plan  Height: '5\' 7"'$  (170.2 cm) Weight: 78.6 kg (173 lb 4.5 oz) IBW/kg (Calculated) : 61.6  Temp (24hrs), Avg:98.4 F (36.9 C), Min:98 F (36.7 C), Max:99.4 F (37.4 C)  Recent Labs  Lab 09/03/22 0301 09/03/22 1809 09/04/22 0240 09/05/22 0247 09/06/22 0422  WBC 16.1* 16.1* 19.8* 20.9* 21.2*  CREATININE 0.83 0.77 0.77 0.78 0.85     Estimated Creatinine Clearance: 66.5 mL/min (by C-G formula based on SCr of 0.85 mg/dL).    Allergies  Allergen Reactions   Penicillins Rash    Has patient had a PCN reaction causing immediate rash, facial/tongue/throat swelling, SOB or lightheadedness with hypotension: Yes Has patient had a PCN reaction causing severe rash involving mucus membranes or skin necrosis: No Has patient had a PCN reaction that required hospitalization: No Has patient had a PCN reaction occurring within the last 10 years: No If all of the above answers are "NO", then may proceed with Cephalosporin use. Ceftriaxone ok    Antimicrobials this admission: 12/21 cefepime >>  12/21 Flagyl >>   Dose adjustments this admission:  Microbiology results: 12/21 Blood: negative 12/21 Urine: insignificant growth 12/23 abscess from gallbladder: cancelled 12/21 COVID, flu and RSV: negative 12/26 MRSA PCR  negative   Thank you for allowing [place] to participate in this patients care. Jens Som, PharmD 09/06/2022 2:17 PM  **Pharmacist phone directory can be found on [email or web address] listed under [place]**

## 2022-09-06 NOTE — TOC Progression Note (Addendum)
Transition of Care [facility]) - Progression Note    Patient Details  Name: Kathleen Valenzuela MRN: [id number] Date of Birth: 26-Mar-1951  [facility] of [facility] CM/SW Contact  Joanne Chars, LCSW Phone Number: 09/06/2022, 10:14 AM  Clinical Narrative:   Per Everlene Balls, pt coverage will need to be rechecked to make sure it is still managed by Navi--they will place ticket and call back when they have determined this.   1340: TC navi.  Need to know pt prior function, looked in PT eval, prior function not recorded.  CSW called so [name] who reports pt was ambulatory with a walker and even walked without the walker at times, facility always tried to have her use the walker for safety. Navi notified in the portal.   1440: auth approved in [place]: [id number], [id number], 3 days: 1/2-1/4.  MD notified.    Expected Discharge Plan: Ages Barriers to Discharge: Continued Medical Work up, SNF Pending bed offer  Expected Discharge Plan and Services In-house Referral: Clinical Social Work   Post Acute Care Choice: [place] Living arrangements for the past 2 months: [place] ([place] in [place])                                       Social Determinants of Health (SDOH) Interventions [place]: No Food Insecurity (09/01/2022)  Housing: Low Risk  (09/01/2022)  Transportation Needs: No Transportation Needs (09/01/2022)  Utilities: Not At Risk (09/01/2022)  Tobacco Use: Medium Risk (08/27/2022)    Readmission Risk Interventions     No data to display

## 2022-09-06 NOTE — Progress Notes (Signed)
Progress Note    Kathleen Valenzuela   KPT:[id number]  DOB: 05-May-1951  DOA: 08/25/2022     12 PCP: Monico Blitz, MD  Initial CC: AMS  Hospital Course: [name] is a 72 yo female with PMH chronic anemia, anxiety, bipolar 1 disorder, COPD, GERD, heart murmur, HLD, HTN who presented with AMS.  She presented from her SNF to the hospital. She underwent workup with CT, ultrasound, and MRCP.  Findings were consistent with a distended sludge-filled gallbladder with diffuse wall thickening and pericholecystic edema.  She underwent evaluation with GI and general surgery.  She underwent percutaneous tube placement on 08/27/2022. Repeat imaging was performed on 09/04/2022 and showed interval development of a right anterolateral abscess formation and second abscess formation in the right adnexal region.  Interval History:  No events overnight.  Patient still somewhat confused when seen this morning.  There has been difficulty keeping IV access due to patient inadvertently pulling them out unintentionally.  Assessment and Plan:  Sepsis POA due to acute cholecystitis Had extensive workup with CT abdomen/pelvis, HIDA scan and MRCP.  Seen by CCS/ IR and transferred from a [facility] to [facility] - cholecystostomy - done on 08/27/2022.  Blood culture no growth urine culture insignificant growth.  On cefepime, Flagyl (abx started 12/21).  Having persistent leukocytosis but no fever> normal lipase, LFTs. - S/P ultrasound as well as CT abdomen 12/31 " Interval development of a 4.1 x 1.8 cm right anterolateral abscess formation. Likely second abscess forming measuring 1.9 x 1.6 cm in the region of the right adnexa. Cholecystostomy tube in good position with decompressed gallbladder lumen."  -Ongoing antibiotics recommended per surgery  Acute hypoxic respiratory failure Acute on chronic diastolic CHF - intermittent lasix -May have become volume overloaded with sepsis resuscitation -Continue oxygen and wean off as able    Acute metabolic encephalopathy -  Likely from sepsis with underlying dementia  Dementia with behavioral disturbances Bipolar disorder -Oriented to name and president only -Continue Namenda and Aricept - Continue Cogentin, Wellbutrin, trazodone, Effexor  Normocytic anemia -Baseline hemoglobin around 12 to 13 g/dL - Remaining stable  Thrombocytopenia resolved. Chronic atrial fibrillation continue Eliquis and metoprolol. Cont to monitor in telemetry Hypokalemia resolved GERD:Continue PPI Concern for aspiration : speech input appreciated diet changed to dysphagia 1   Old records reviewed in assessment of this patient  Antimicrobials: Cefepime 08/26/2022 >> current Flagyl 08/26/2022 >> current  DVT prophylaxis:  SCDs Start: 08/26/22 0137 apixaban (ELIQUIS) tablet 5 mg   Code Status:   Code Status: Full Code  Mobility Assessment (last 72 hours)     Mobility Assessment     Row Name 09/06/22 0730 09/06/22 0004 09/05/22 1017 09/04/22 1934 09/04/22 1130   Does patient have an order for bedrest or is patient medically unstable No - Continue assessment No - Continue assessment No - Continue assessment No - Continue assessment No - Continue assessment   What is the highest level of mobility based on the progressive mobility assessment? Level 3 (Stands with assist) - Balance while standing  and cannot march in place Level 3 (Stands with assist) - Balance while standing  and cannot march in place Level 2 (Chairfast) - Balance while sitting on edge of bed and cannot stand Level 3 (Stands with assist) - Balance while standing  and cannot march in place Level 3 (Stands with assist) - Balance while standing  and cannot march in place   Is the above level different from baseline mobility prior to current illness?  Yes - Recommend PT order Yes - Recommend PT order Yes - Recommend PT order Yes - Recommend PT order Yes - Recommend PT order    [place] Name 09/04/22 0907 09/03/22 2006         Does  patient have an order for bedrest or is patient medically unstable No - Continue assessment No - Continue assessment      What is the highest level of mobility based on the progressive mobility assessment? Level 3 (Stands with assist) - Balance while standing  and cannot march in place Level 3 (Stands with assist) - Balance while standing  and cannot march in place      Is the above level different from baseline mobility prior to current illness? Yes - Recommend PT order Yes - Recommend PT order               Barriers to discharge:  Disposition Plan:  SNF Status is: Inpt  Objective: Blood pressure 129/67, pulse 78, temperature 98.4 F (36.9 C), temperature source Oral, resp. rate 17, height '5\' 7"'$  (1.702 m), weight 78.6 kg, SpO2 95 %.  Examination:  Physical Exam Constitutional:      Comments: Pleasant and demented elderly woman resting in bed in no distress  HENT:     Head: Normocephalic and atraumatic.  Eyes:     Extraocular Movements: Extraocular movements intact.  Cardiovascular:     Rate and Rhythm: Normal rate and regular rhythm.  Pulmonary:     Effort: No respiratory distress.     Breath sounds: Normal breath sounds. No wheezing.  Abdominal:     General: Bowel sounds are normal. There is no distension.     Palpations: Abdomen is soft.     Tenderness: There is no abdominal tenderness.  Musculoskeletal:        General: Normal range of motion.     Cervical back: Normal range of motion and neck supple.     Comments: Left midline noted in arm  Skin:    General: Skin is warm and dry.  Neurological:     Comments: Oriented only to name and president.  Moves all 4 extremities and follows commands      Consultants:  GI General surgery  Procedures:  08/27/22: PCT to RUQ  Data Reviewed: Results for orders placed or performed during the hospital encounter of 08/25/22 (from the past 24 hour(s))  CBC     Status: Abnormal   Collection Time: 09/06/22  4:22 AM  Result  Value Ref Range   WBC 21.2 (H) 4.0 - 10.5 K/uL   RBC 3.86 (L) 3.87 - 5.11 MIL/uL   Hemoglobin 11.5 (L) 12.0 - 15.0 g/dL   HCT 34.9 (L) 36.0 - 46.0 %   MCV 90.4 80.0 - 100.0 fL   MCH 29.8 26.0 - 34.0 pg   MCHC 33.0 30.0 - 36.0 g/dL   RDW 15.6 (H) 11.5 - 15.5 %   Platelets 431 (H) 150 - 400 K/uL   nRBC 0.0 0.0 - 0.2 %  Comprehensive metabolic panel     Status: Abnormal   Collection Time: 09/06/22  4:22 AM  Result Value Ref Range   Sodium 137 135 - 145 mmol/L   Potassium 3.8 3.5 - 5.1 mmol/L   Chloride 105 98 - 111 mmol/L   CO2 22 22 - 32 mmol/L   Glucose, Bld 107 (H) 70 - 99 mg/dL   BUN 10 8 - 23 mg/dL   Creatinine, Ser 0.85 0.44 - 1.00 mg/dL  Calcium 9.1 8.9 - 10.3 mg/dL   Total Protein 6.6 6.5 - 8.1 g/dL   Albumin 2.4 (L) 3.5 - 5.0 g/dL   AST 33 15 - 41 U/L   ALT 21 0 - 44 U/L   Alkaline Phosphatase 69 38 - 126 U/L   Total Bilirubin 0.3 0.3 - 1.2 mg/dL   GFR, Estimated >60 >60 mL/min   Anion gap 10 5 - 15  Glucose, capillary     Status: Abnormal   Collection Time: 09/06/22  9:38 AM  Result Value Ref Range   Glucose-Capillary 107 (H) 70 - 99 mg/dL  Glucose, capillary     Status: Abnormal   Collection Time: 09/06/22 11:26 AM  Result Value Ref Range   Glucose-Capillary 113 (H) 70 - 99 mg/dL  Glucose, capillary     Status: Abnormal   Collection Time: 09/06/22  3:52 PM  Result Value Ref Range   Glucose-Capillary 130 (H) 70 - 99 mg/dL    I have Reviewed nursing notes, Vitals, and Lab results since pt's last encounter. Pertinent lab results : see above I have ordered labwork to follow up on.  I have reviewed the last note from staff over past 24 hours I have discussed pt's care plan and test results with nursing staff, CM/SW, and other staff as appropriate  Time spent: Greater than 50% of the 55 minute visit was spent in counseling/coordination of care for the patient as laid out in the A&P.   LOS: 12 days   Dwyane Dee, MD [facility] 09/06/2022, 5:15 PM

## 2022-09-06 NOTE — Progress Notes (Signed)
Mobility Specialist Progress Note   09/06/22 1020  Mobility  Activity Transferred to/from [facility];Transferred from bed to chair;Transferred from chair to bed  Level of Assistance +2 (takes two people) Personal assistant)  Information systems manager Ambulated (ft) 10 ft  Activity Response Tolerated fair  Mobility Referral Yes  $Mobility charge 1 Mobility   Pre Mobility: 74 HR, 144/55 BP, 100% SpO2 on RA During Mobility: 88 HR, 92% SpO2 on RA  Post Mobility: 82 HR, 98% SpO2 on RA  Received in bed w/ [place] off, lethargic and sleepy, pt was difficult to arouse the entire session. MinA throughout for mechanical guidance w/ RW but Max cues for sequencing, pt requiring repetition of cues to follow step by step task throughout. Transferred to [facility] w/ successful BM and void, +2A for pericare while standing for safety then placed in chair. IV team shortly arriving afterwards and requesting pt in bed. Pt following cues w/ less repetition but requiring cues for [place] and foot placement until supine in bed. Left w/ RN present in room and RN aware of lethargy.    [place] Mobility Specialist Please contact via SecureChat or  Rehab office at [phone]

## 2022-09-06 NOTE — Progress Notes (Signed)

## 2022-09-06 NOTE — Progress Notes (Signed)
[facility] Progress Note     Subjective: CC:  Oriented to person, Kathleen Valenzuela, and admission for gallbladder issues. Reports the year as 2023.   Denies abd pain or nausea.   Objective: Vital signs in last 24 hours: Temp:  [98 F (36.7 C)-99.4 F (37.4 C)] 98 F (36.7 C) (01/02 0440) Pulse Rate:  [60-79] 71 (01/02 0440) Resp:  [15-18] 15 (01/02 0440) BP: (120-143)/(59-73) 132/73 (01/02 0440) SpO2:  [92 %-99 %] 97 % (01/02 0440) Last BM Date : 09/05/22  Intake/Output from previous day: 01/01 0701 - 01/02 0700 In: 1510.4 [P.O.:240; I.V.:295; IV Piggyback:975.4] Out: 1235 [Urine:1200; Drains:35] Intake/Output this shift: Total I/O In: -  Out: 5 [Drains:5]  PE: Gen:  Alert, NAD, chronically ill appearing Card:  Regular rate and rhythm Pulm:  Normal effort on nasal cannula  Abd: Soft, nondistended, mild TTP epigastrium and RUQ without guarding, perc chole in place to bulb drain with SS fluid.  Skin: warm and dry, no rashes  Psych: A&Ox3   Lab Results:  Recent Labs    09/05/22 0247 09/06/22 0422  WBC 20.9* 21.2*  HGB 11.5* 11.5*  HCT 35.1* 34.9*  PLT 420* 431*   BMET Recent Labs    09/05/22 0247 09/06/22 0422  NA 136 137  K 3.8 3.8  CL 105 105  CO2 20* 22  GLUCOSE 113* 107*  BUN 9 10  CREATININE 0.78 0.85  CALCIUM 9.2 9.1   PT/INR No results for input(s): "LABPROT", "INR" in the last 72 hours. CMP     Component Value Date/Time   NA 137 09/06/2022 0422   K 3.8 09/06/2022 0422   CL 105 09/06/2022 0422   CO2 22 09/06/2022 0422   GLUCOSE 107 (H) 09/06/2022 0422   BUN 10 09/06/2022 0422   CREATININE 0.85 09/06/2022 0422   CALCIUM 9.1 09/06/2022 0422   PROT 6.6 09/06/2022 0422   ALBUMIN 2.4 (L) 09/06/2022 0422   AST 33 09/06/2022 0422   ALT 21 09/06/2022 0422   ALKPHOS 69 09/06/2022 0422   BILITOT 0.3 09/06/2022 0422   GFRNONAA >60 09/06/2022 0422   GFRAA >60 04/15/2020 0413   Lipase     Component Value Date/Time   LIPASE 44  09/04/2022 0926       Studies/Results: CT ABDOMEN PELVIS W CONTRAST  Result Date: 09/04/2022 CLINICAL DATA:  RLQ abdominal pain RUQ pain, assess placement of perc chole drain, persistent leukocytosis EXAM: CT ABDOMEN AND PELVIS WITH CONTRAST TECHNIQUE: Multidetector CT imaging of the abdomen and pelvis was performed using the standard protocol following bolus administration of intravenous contrast. RADIATION DOSE REDUCTION: This exam was performed according to the departmental dose-optimization program which includes automated exposure control, adjustment of the mA and/or kV according to patient size and/or use of iterative reconstruction technique. CONTRAST:  62m OMNIPAQUE IOHEXOL 350 MG/ML SOLN COMPARISON:  CT chest abdomen pelvis 08/25/2022 FINDINGS: Lower chest: Bilateral trace pleural effusions. Associated passive atelectasis of the bilateral lower lobes. Patchy peribronchovascular airspace opacities within the lingula. Coronary artery calcification and mitral annular calcification. Hepatobiliary: No focal liver abnormality. Cholecystostomy tube terminates within a decompressed gallbladder lumen. No CT evidence of gallstones, gallbladder wall thickening, or pericholecystic fluid. No biliary dilatation. Pancreas: No focal lesion. Normal pancreatic contour. No surrounding inflammatory changes. No main pancreatic ductal dilatation. Spleen: Similar-appearing 1.7 cm hypodense lesion within the spleen clearance the spleen is normal size. Adrenals/Urinary Tract: No adrenal nodule bilaterally. Bilateral kidneys enhance symmetrically. Subcentimeter hypodensities too small to characterize-no further follow-up indicated. No  hydronephrosis. No hydroureter. The urinary bladder is unremarkable. On delayed imaging, there is no urothelial wall thickening and there are no filling defects in the opacified portions of the bilateral collecting systems or ureters. Stomach/Bowel: PO contrast reaches the large bowel.  Stomach is within normal limits. No evidence of bowel wall thickening or dilatation. Diffuse sigmoid and otherwise scattered colonic diverticulosis. Appendix appears normal. Vascular/Lymphatic: No abdominal aorta or iliac aneurysm. Severe atherosclerotic plaque of the aorta and its branches. No abdominal, pelvic, or inguinal lymphadenopathy. Reproductive: Uterus and bilateral adnexa are unremarkable. Other: No intraperitoneal free fluid. No intraperitoneal free gas. Interval development of a 4.1 x 1.8 cm right anterolateral organized fluid collection with mild peripheral enhancement. Likely second organized fluid collection developing within the right adnexal region and measuring 1.9 x 1.6 cm. Possible developing fluid collection along the rectouterine cholestatic. Musculoskeletal: No abdominal wall hernia or abnormality. No suspicious lytic or blastic osseous lesions. No acute displaced fracture. IMPRESSION: 1. Patchy peribronchovascular airspace opacities within the lingula. Findings suggest infection/inflammation. 2. Bilateral trace pleural effusions. 3. Interval development of a 4.1 x 1.8 cm right anterolateral abscess formation. Likely second abscess forming measuring 1.9 x 1.6 cm in the region of the right adnexa. 4. Cholecystostomy tube in good position with decompressed gallbladder lumen. 5. Colonic diverticulosis with no acute diverticulitis. 6. Aortic Atherosclerosis (ICD10-I70.0) with coronary artery and mitral annular calcification. Electronically Signed   By: Iven Finn M.D.   On: 09/04/2022 23:56   US ABDOMEN LIMITED RUQ (LIVER/GB)  Result Date: 09/04/2022 CLINICAL DATA:  Leukocytosis. Status post percutaneous cholecystostomy for cholecystitis. EXAM: ULTRASOUND ABDOMEN LIMITED COMPARISON:  08/26/2022. FINDINGS: No focal hepatic parenchymal abnormalities. No biliary ductal dilatation identified. The CBD was 12 mm. The gallbladder was not visualized. IMPRESSION: CBD dilatation. Gallbladder not  visualized. No hepatic parenchymal abnormalities. Electronically Signed   By: Sammie Bench M.D.   On: 09/04/2022 11:56    Anti-infectives: Anti-infectives (From admission, onward)    Start     Dose/Rate Route Frequency Ordered Stop   08/29/22 1400  ceFEPIme (MAXIPIME) 2 g in sodium chloride 0.9 % 100 mL IVPB        2 g 200 mL/hr over 30 Minutes Intravenous Every 8 hours 08/29/22 0750     08/26/22 1800  ceFEPIme (MAXIPIME) 2 g in sodium chloride 0.9 % 100 mL IVPB  Status:  Discontinued        2 g 200 mL/hr over 30 Minutes Intravenous Every 12 hours 08/26/22 0756 08/29/22 0750   08/26/22 1200  metroNIDAZOLE (FLAGYL) IVPB 500 mg        500 mg 100 mL/hr over 60 Minutes Intravenous Every 12 hours 08/26/22 0136     08/26/22 0600  ceFEPIme (MAXIPIME) 2 g in sodium chloride 0.9 % 100 mL IVPB  Status:  Discontinued        2 g 200 mL/hr over 30 Minutes Intravenous Every 8 hours 08/26/22 0013 08/26/22 0756   08/25/22 2300  ceFEPIme (MAXIPIME) 2 g in sodium chloride 0.9 % 100 mL IVPB       See Hyperspace for full Linked Orders Report.   2 g 200 mL/hr over 30 Minutes Intravenous  Once 08/25/22 2252 08/26/22 0030   08/25/22 2300  metroNIDAZOLE (FLAGYL) IVPB 500 mg       See Hyperspace for full Linked Orders Report.   500 mg 100 mL/hr over 60 Minutes Intravenous  Once 08/25/22 2252 08/26/22 0136        Assessment/Plan  Acute cholecystitis S/p  percutaneous cholecystostomy tube placement 08/27/22  ?Choledocholithiasis - MRCP 08/26/2022 showed a distended gallbladder with sludge and diffuse wall thickening with pericholecystic edema without gallstones, mild diffuse intrahepatic biliary ductal dilatation with a dilated CBD duct 13 mm with a possible stone in the lower third of the CBD. LFTs are WNL - GI is following and considering EUS this week for further evaluation once anticoagulation washes out Persistent RUQ pain and leukocytosis - CT a/p from 12/31 w/ perc chole tube to be in correct  position; it also reports a small 4.1 x 1.8 cm right anterolateral fluid collection as well as possible developing fluid collection in the right adnexal region and measuring 1.9 x 1.6 cm. Reviewed with MD. This fluid collection is small and we would not recommend perc drainage at this time. Radiology reviewed and it is not amenable to drainage.  She is on antibiotics for her cholecystitis which will likely help if this is a small abscess.   ID - currently maxipime and flagyl VTE - eliquis FEN - D1 diet, ensure TID Foley - none    LOS: 12 days   I reviewed nursing notes, Consultant GI notes, last 24 h vitals and pain scores, last 48 h intake and output, last 24 h labs and trends, and last 24 h imaging results.  This care required moderate level of medical decision making.   Obie Dredge, PA-C [facility] Please see Amion for pager number during day hours 7:00am-4:30pm

## 2022-09-07 ENCOUNTER — Inpatient Hospital Stay (HOSPITAL_COMMUNITY): Payer: Medicare Other

## 2022-09-07 DIAGNOSIS — R6520 Severe sepsis without septic shock: Secondary | ICD-10-CM | POA: Diagnosis not present

## 2022-09-07 DIAGNOSIS — A419 Sepsis, unspecified organism: Secondary | ICD-10-CM | POA: Diagnosis not present

## 2022-09-07 DIAGNOSIS — R1011 Right upper quadrant pain: Secondary | ICD-10-CM

## 2022-09-07 DIAGNOSIS — G9341 Metabolic encephalopathy: Secondary | ICD-10-CM | POA: Diagnosis not present

## 2022-09-07 DIAGNOSIS — K810 Acute cholecystitis: Secondary | ICD-10-CM | POA: Diagnosis not present

## 2022-09-07 LAB — CBC WITH DIFFERENTIAL/PLATELET
Abs Immature Granulocytes: 0.46 10*3/uL — ABNORMAL HIGH (ref 0.00–0.07)
Basophils Absolute: 0.2 10*3/uL — ABNORMAL HIGH (ref 0.0–0.1)
Basophils Relative: 1 %
Eosinophils Absolute: 0.6 10*3/uL — ABNORMAL HIGH (ref 0.0–0.5)
Eosinophils Relative: 3 %
HCT: 36.6 % (ref 36.0–46.0)
Hemoglobin: 11.4 g/dL — ABNORMAL LOW (ref 12.0–15.0)
Immature Granulocytes: 2 %
Lymphocytes Relative: 15 %
Lymphs Abs: 2.9 10*3/uL (ref 0.7–4.0)
MCH: 29.2 pg (ref 26.0–34.0)
MCHC: 31.1 g/dL (ref 30.0–36.0)
MCV: 93.8 fL (ref 80.0–100.0)
Monocytes Absolute: 1.5 10*3/uL — ABNORMAL HIGH (ref 0.1–1.0)
Monocytes Relative: 8 %
Neutro Abs: 13.8 10*3/uL — ABNORMAL HIGH (ref 1.7–7.7)
Neutrophils Relative %: 71 %
Platelets: 455 10*3/uL — ABNORMAL HIGH (ref 150–400)
RBC: 3.9 MIL/uL (ref 3.87–5.11)
RDW: 15.7 % — ABNORMAL HIGH (ref 11.5–15.5)
WBC: 19.5 10*3/uL — ABNORMAL HIGH (ref 4.0–10.5)
nRBC: 0 % (ref 0.0–0.2)

## 2022-09-07 LAB — MAGNESIUM: Magnesium: 2 mg/dL (ref 1.7–2.4)

## 2022-09-07 LAB — COMPREHENSIVE METABOLIC PANEL
ALT: 18 U/L (ref 0–44)
AST: 27 U/L (ref 15–41)
Albumin: 2.5 g/dL — ABNORMAL LOW (ref 3.5–5.0)
Alkaline Phosphatase: 71 U/L (ref 38–126)
Anion gap: 9 (ref 5–15)
BUN: 11 mg/dL (ref 8–23)
CO2: 23 mmol/L (ref 22–32)
Calcium: 9.2 mg/dL (ref 8.9–10.3)
Chloride: 105 mmol/L (ref 98–111)
Creatinine, Ser: 0.78 mg/dL (ref 0.44–1.00)
GFR, Estimated: >60 mL/min (ref >60)
Glucose, Bld: 116 mg/dL — ABNORMAL HIGH (ref 70–99)
Potassium: 3.9 mmol/L (ref 3.5–5.1)
Sodium: 137 mmol/L (ref 135–145)
Total Bilirubin: 0.1 mg/dL — ABNORMAL LOW (ref 0.3–1.2)
Total Protein: 7 g/dL (ref 6.5–8.1)

## 2022-09-07 LAB — TROPONIN I (HIGH SENSITIVITY): Troponin I (High Sensitivity): 5 ng/L (ref <18)

## 2022-09-07 LAB — GLUCOSE, CAPILLARY
Glucose-Capillary: 104 mg/dL — ABNORMAL HIGH (ref 70–99)
Glucose-Capillary: 114 mg/dL — ABNORMAL HIGH (ref 70–99)
Glucose-Capillary: 131 mg/dL — ABNORMAL HIGH (ref 70–99)

## 2022-09-07 MED ORDER — IOHEXOL 300 MG/ML  SOLN
50.0000 mL | Freq: Once | INTRAMUSCULAR | Status: AC | PRN
  Administered 2022-09-07: 10 mL

## 2022-09-07 MED ORDER — METOPROLOL TARTRATE 5 MG/5ML IV SOLN: 5.0000 mg | INTRAVENOUS | Status: DC | PRN

## 2022-09-07 MED ORDER — METOPROLOL SUCCINATE ER 25 MG PO TB24
25.0000 mg | ORAL_TABLET | Freq: Every day | ORAL | Status: DC
  Administered 2022-09-08 – 2022-09-16 (×9): 25 mg via ORAL
  Filled 2022-09-07 (×9): qty 1

## 2022-09-07 MED ORDER — SODIUM CHLORIDE 0.9 % IV SOLN
2.0000 g | INTRAVENOUS | Status: DC
  Administered 2022-09-07: 2 g via INTRAVENOUS
  Filled 2022-09-07: qty 20

## 2022-09-07 MED ORDER — HYDROXYZINE HCL 25 MG PO TABS
25.0000 mg | ORAL_TABLET | Freq: Three times a day (TID) | ORAL | Status: DC | PRN
  Administered 2022-09-07 – 2022-09-16 (×6): 25 mg via ORAL
  Filled 2022-09-07 (×7): qty 1

## 2022-09-07 NOTE — Progress Notes (Signed)
Physical Therapy Treatment Patient Details Name: Kathleen Valenzuela MRN: [id number] DOB: 07-03-1951 Today's Date: 09/07/2022   History of Present Illness Pt is a 72 yo female admitted from SNF on 08/25/22 with AMS; workup for acute cholycystitis, sepsis. Worsening respiratory status 12/25 requiring BiPAP; rapid response 12/26 for respiratory status and BiPAP intolerance, transfer to ICU. PMH includes dementia, anemia, anxiety, bipolar 1 disorder, COPD, GERD, heart murmur, HLD, HTN.    PT Comments    Patient is anxious throughout session and needs encouragement to participate. Min A required for bed mobility with good sitting balance demonstrated. Patient declined standing and expressed her fear of falling. She will need SNF placement at discharge. PT will continue to follow to maximize independence and decrease caregiver burden.    Recommendations for follow up therapy are one component of a multi-disciplinary discharge planning process, led by the attending physician.  Recommendations may be updated based on patient status, additional functional criteria and insurance authorization.  Follow Up Recommendations  Skilled nursing-short term rehab (<3 hours/day) Can patient physically be transported by private vehicle: No   Assistance Recommended at Discharge Frequent or constant Supervision/Assistance  Patient can return home with the following A lot of help with walking and/or transfers;A lot of help with bathing/dressing/bathroom   Equipment Recommendations  Rolling walker (2 wheels)    Recommendations for Other Services       Precautions / Restrictions Precautions Precautions: Fall;Other (comment) Precaution Comments: RUQ drain Restrictions Weight Bearing Restrictions: No     Mobility  Bed Mobility Overal bed mobility: Needs Assistance Bed Mobility: Sit to Supine, Supine to Sit     Supine to sit: Min assist Sit to supine: Min assist   General bed mobility comments: assistance for  trunk support to sit upright. assistance for BLE support to return to bed. patient complains of fear of falling with sitting upright with good sitting balance demonstrated.    Transfers                   General transfer comment: not attempted due to anxiety, patient requesting to return to bed    Ambulation/Gait                   Stairs             Wheelchair Mobility    Modified Rankin (Stroke Patients Only)       Balance Overall balance assessment: Needs assistance Sitting-balance support: No upper extremity supported Sitting balance-Leahy Scale: Good Sitting balance - Comments: patient prefers HHA, however does not actually require assistance to maintain sitting balance                                    Cognition Arousal/Alertness: Awake/alert Behavior During Therapy: Restless, Anxious Overall Cognitive Status: History of cognitive impairments - at baseline                                 General Comments: patient anxious throughout and needs encouragement to participate        Exercises      General Comments        Pertinent Vitals/Pain Pain Assessment Pain Assessment: No/denies pain    Home Living  Prior Function            PT Goals (current goals can now be found in the care plan section) Acute Rehab PT Goals Patient Stated Goal: none stated PT Goal Formulation: Patient unable to participate in goal setting Time For Goal Achievement: 09/11/22 Potential to Achieve Goals: Fair Progress towards PT goals: Progressing toward goals    Frequency    Min 2X/week      PT Plan Current plan remains appropriate    Co-evaluation              AM-PAC PT "6 Clicks" Mobility   Outcome Measure  Help needed turning from your back to your side while in a flat bed without using bedrails?: A Little Help needed moving from lying on your back to sitting on the side  of a flat bed without using bedrails?: A Little Help needed moving to and from a bed to a chair (including a wheelchair)?: A Lot Help needed standing up from a chair using your arms (e.g., wheelchair or bedside chair)?: A Lot Help needed to walk in hospital room?: Total Help needed climbing 3-5 steps with a railing? : Total 6 Click Score: 12    End of Session   Activity Tolerance: Patient limited by fatigue Patient left: in bed;with call bell/phone within reach;with bed alarm set Nurse Communication: Mobility status PT Visit Diagnosis: Unsteadiness on feet (R26.81);Muscle weakness (generalized) (M62.81)     Time: 5537-4827 PT Time Calculation (min) (ACUTE ONLY): 15 min  Charges:  $Therapeutic Activity: 8-22 mins                    [name], PT, MPT    [name] 09/07/2022, 3:25 PM

## 2022-09-07 NOTE — Progress Notes (Signed)
Mobility Specialist Progress Note   09/07/22 1505  Mobility  Activity Moved into chair position in bed (Bed level exercise)  Level of Assistance Standby assist, set-up cues, supervision of patient - no hands on  Assistive Device None  Range of Motion/Exercises Active  Activity Response Tolerated fair   Patient received in supine and reluctant to participate. Deferred OOB activity and eventually agreed to participate in bed level exercises. Completed x3 low level bed exercises requiring tactile cues.Tolerated without complaint or incident. Was left in supine with all needs met, call bell in reach.   [place] Garth Diffley, BS EXP Mobility Specialist Please contact via SecureChat or Rehab office at [phone]

## 2022-09-07 NOTE — Progress Notes (Signed)
Progress Note    Kathleen Valenzuela   QPY:[id number]  DOB: Dec 28, 1950  DOA: 08/25/2022     13 PCP: Monico Blitz, MD  Initial CC: AMS  Hospital Course: [name] is a 72 yo female with PMH chronic anemia, anxiety, bipolar 1 disorder, COPD, GERD, heart murmur, HLD, HTN who presented with AMS.  She presented from her SNF to the hospital. She underwent workup with CT, ultrasound, and MRCP.  Findings were consistent with a distended sludge-filled gallbladder with diffuse wall thickening and pericholecystic edema.  She underwent evaluation with GI and general surgery.  She underwent percutaneous tube placement on 08/27/2022. Repeat imaging was performed on 09/04/2022 and showed interval development of a right anterolateral abscess formation and second abscess formation in the right adnexal region.  Interval History:  No events overnight. Remains somewhat confused but I suspect this can't be too far off her baseline with background of dementia  Assessment and Plan:  Sepsis due to acute cholecystitis - Had extensive workup with CT abdomen/pelvis, HIDA scan and MRCP.  Seen by [facility]/ IR and [facility] - cholecystostomy - performed on 08/27/2022 - S/P ultrasound as well as CT abdomen 12/31 " Interval development of a 4.1 x 1.8 cm right anterolateral abscess formation. Likely second abscess forming measuring 1.9 x 1.6 cm in the region of the right adnexa. Cholecystostomy tube in good position with decompressed gallbladder lumen."  - blood cultures remained negative; Ucx insignificant growth but treated likely by default - has been on cefepime and flagyl since 12/21 and continues to have persistent leukocytosis - given ongoing "confusion" and persistent leukocytosis, I would like to try alternative abx to avoid confounding of encephalopathy from cefepime and see if it helps WBC possibly too - d/c cefepime, start rocephin; goal is Zosyn but given her possible rash rxn to PCN will order pcn skin  testing first also and if negative, then will d/c allergy on profile and switch - continue flagyl for now  Leukocytosis - see above as well - differential would still include abdominal source but agree prudent to rule out respiratory as well  - starting with CXR and if equivocal will repeat CT to include chest and abdomen (to re-evaluate abscesses as well)   Acute metabolic encephalopathy -Suspected multifactorial from sepsis and underlying dementia.  Also has been on prolonged cefepime; see reasoning above for antibiotic transition  Dementia with behavioral disturbances Bipolar disorder -Oriented to name and president only -Continue Namenda and Aricept - Continue Cogentin, Wellbutrin, trazodone, Effexor  Acute hypoxic respiratory failure -resolved - on RA Acute on chronic diastolic CHF - resolved -Now euvolemic Normocytic anemia - Baseline hemoglobin around 12 to 13 g/dL Thrombocytopenia resolved. Chronic atrial fibrillation continue Eliquis and metoprolol Hypokalemia - replete as needed GERD:Continue PPI Dysphagia: speech input appreciated diet changed to dysphagia 1   Old records reviewed in assessment of this patient  Antimicrobials: Cefepime 08/26/2022 >> current Flagyl 08/26/2022 >> current  DVT prophylaxis:  SCDs Start: 08/26/22 0137 apixaban (ELIQUIS) tablet 5 mg   Code Status:   Code Status: Full Code  Mobility Assessment (last 72 hours)     Mobility Assessment     Row Name 09/07/22 1500 09/07/22 0830 09/06/22 1930 09/06/22 0730 09/06/22 0004   Does patient have an order for bedrest or is patient medically unstable -- No - Continue assessment No - Continue assessment No - Continue assessment No - Continue assessment   What is the highest level of mobility based on the  progressive mobility assessment? Level 2 (Chairfast) - Balance while sitting on edge of bed and cannot stand Level 3 (Stands with assist) - Balance while standing  and cannot march in place Level 3  (Stands with assist) - Balance while standing  and cannot march in place Level 3 (Stands with assist) - Balance while standing  and cannot march in place Level 3 (Stands with assist) - Balance while standing  and cannot march in place   Is the above level different from baseline mobility prior to current illness? -- Yes - Recommend PT order Yes - Recommend PT order Yes - Recommend PT order Yes - Recommend PT order    Row Name 09/05/22 1017 09/04/22 1934         Does patient have an order for bedrest or is patient medically unstable No - Continue assessment No - Continue assessment      What is the highest level of mobility based on the progressive mobility assessment? Level 2 (Chairfast) - Balance while sitting on edge of bed and cannot stand Level 3 (Stands with assist) - Balance while standing  and cannot march in place      Is the above level different from baseline mobility prior to current illness? Yes - Recommend PT order Yes - Recommend PT order               Barriers to discharge:  Disposition Plan:  SNF Status is: Inpt  Objective: Blood pressure (!) 133/90, pulse 88, temperature 98.4 F (36.9 C), temperature source Oral, resp. rate 16, height '5\' 7"'$  (1.702 m), weight 78.6 kg, SpO2 93 %.  Examination:  Physical Exam Constitutional:      Comments: Pleasant and demented elderly woman resting in bed in no distress  HENT:     Head: Normocephalic and atraumatic.  Eyes:     Extraocular Movements: Extraocular movements intact.  Cardiovascular:     Rate and Rhythm: Normal rate and regular rhythm.  Pulmonary:     Effort: No respiratory distress.     Breath sounds: Normal breath sounds. No wheezing.  Abdominal:     General: Bowel sounds are normal. There is no distension.     Palpations: Abdomen is soft.     Tenderness: There is no abdominal tenderness.  Musculoskeletal:        General: Normal range of motion.     Cervical back: Normal range of motion and neck supple.      Comments: Left midline noted in arm  Skin:    General: Skin is warm and dry.  Neurological:     Comments: Oriented only to name and president.  Moves all 4 extremities and follows commands      Consultants:  GI General surgery  Procedures:  08/27/22: PCT to RUQ  Data Reviewed: Results for orders placed or performed during the hospital encounter of 08/25/22 (from the past 24 hour(s))  Comprehensive metabolic panel     Status: Abnormal   Collection Time: 09/07/22  3:06 AM  Result Value Ref Range   Sodium 137 135 - 145 mmol/L   Potassium 3.9 3.5 - 5.1 mmol/L   Chloride 105 98 - 111 mmol/L   CO2 23 22 - 32 mmol/L   Glucose, Bld 116 (H) 70 - 99 mg/dL   BUN 11 8 - 23 mg/dL   Creatinine, Ser 0.78 0.44 - 1.00 mg/dL   Calcium 9.2 8.9 - 10.3 mg/dL   Total Protein 7.0 6.5 - 8.1 g/dL   Albumin  2.5 (L) 3.5 - 5.0 g/dL   AST 27 15 - 41 U/L   ALT 18 0 - 44 U/L   Alkaline Phosphatase 71 38 - 126 U/L   Total Bilirubin 0.1 (L) 0.3 - 1.2 mg/dL   GFR, Estimated >60 >60 mL/min   Anion gap 9 5 - 15  CBC with Differential/Platelet     Status: Abnormal   Collection Time: 09/07/22  3:06 AM  Result Value Ref Range   WBC 19.5 (H) 4.0 - 10.5 K/uL   RBC 3.90 3.87 - 5.11 MIL/uL   Hemoglobin 11.4 (L) 12.0 - 15.0 g/dL   HCT 36.6 36.0 - 46.0 %   MCV 93.8 80.0 - 100.0 fL   MCH 29.2 26.0 - 34.0 pg   MCHC 31.1 30.0 - 36.0 g/dL   RDW 15.7 (H) 11.5 - 15.5 %   Platelets 455 (H) 150 - 400 K/uL   nRBC 0.0 0.0 - 0.2 %   Neutrophils Relative % 71 %   Neutro Abs 13.8 (H) 1.7 - 7.7 K/uL   Lymphocytes Relative 15 %   Lymphs Abs 2.9 0.7 - 4.0 K/uL   Monocytes Relative 8 %   Monocytes Absolute 1.5 (H) 0.1 - 1.0 K/uL   Eosinophils Relative 3 %   Eosinophils Absolute 0.6 (H) 0.0 - 0.5 K/uL   Basophils Relative 1 %   Basophils Absolute 0.2 (H) 0.0 - 0.1 K/uL   Immature Granulocytes 2 %   Abs Immature Granulocytes 0.46 (H) 0.00 - 0.07 K/uL  Magnesium     Status: None   Collection Time: 09/07/22  3:06 AM   Result Value Ref Range   Magnesium 2.0 1.7 - 2.4 mg/dL  Glucose, capillary     Status: Abnormal   Collection Time: 09/07/22  8:13 AM  Result Value Ref Range   Glucose-Capillary 104 (H) 70 - 99 mg/dL  Glucose, capillary     Status: Abnormal   Collection Time: 09/07/22 12:04 PM  Result Value Ref Range   Glucose-Capillary 114 (H) 70 - 99 mg/dL    I have Reviewed nursing notes, Vitals, and Lab results since pt's last encounter. Pertinent lab results : see above I have ordered labwork to follow up on.  I have reviewed the last note from staff over past 24 hours I have discussed pt's care plan and test results with nursing staff, CM/SW, and other staff as appropriate  Time spent: Greater than 50% of the 55 minute visit was spent in counseling/coordination of care for the patient as laid out in the A&P.   LOS: 13 days   Dwyane Dee, MD [facility] 09/07/2022, 3:57 PM

## 2022-09-07 NOTE — Progress Notes (Signed)
Referring Physician(s): Clanford Wynetta Emery  Supervising Physician: Sandi Mariscal  Patient Status:  [facility] - In-pt  Chief Complaint:  Follow up Perc chole   Brief History:  Kathleen Valenzuela is a 72 y.o. female with PMH significant for anemia, anxiety, bipolar disorder, COPD, heart murmur, and hypertension.  She developed a fever at her care facility on 08/25/22. Her son brought her to [facility] ED for evaluation.   She was found to have acute cholecystitis on CT.   Follow-up MRCP found a sludge-filled gallbladder without evidence of a stone, though the common bile duct did have a 36m filling defect compatible with a stone or sludge.   [facility] which was done on 08/27/22.  She is seen in IR today for cholangiogram due to ongoing RUQ pain and leukocytosis.  Subjective:  Quite somnolent. She does awaken to voice. Does not appear to be in pain. No current complaints.  Allergies: Penicillins  Medications: Prior to Admission medications   Medication Sig Start Date End Date Taking? Authorizing Provider  acetaminophen (TYLENOL) 650 MG CR tablet Take 650 mg by mouth every 8 (eight) hours as needed for pain.   Yes [provider]  albuterol (PROVENTIL HFA;VENTOLIN HFA) 108 (90 BASE) MCG/ACT inhaler Inhale 2 puffs into the lungs 4 (four) times daily. (0800, 1200, 1600, & 2000)   Yes [provider]  ALPRAZolam (XANAX) 1 MG tablet Take 0.5 tablets (0.5 mg total) by mouth 3 (three) times daily as needed for anxiety. (0800, 1400, & 2000) Patient taking differently: Take 0.5 mg by mouth 2 (two) times daily. (0800 & 2000) 04/24/17  Yes BRosita Fire MD  apixaban (ELIQUIS) 5 MG TABS tablet Take 1 tablet (5 mg total) by mouth 2 (two) times daily. 04/15/20  Yes Tat, DShanon Brow MD  atorvastatin (LIPITOR) 10 MG tablet Take 1 tablet (10 mg total) by mouth daily at 8 pm. Resume on 04/22/20 if LFTs are back  to normal Patient taking differently: Take 40 mg by mouth daily at 8 pm. 04/15/20  Yes Tat, DShanon Brow MD  benztropine (COGENTIN) 1 MG tablet Take 0.5 mg by mouth 2 (two) times daily. 04/10/20  Yes [provider]  buPROPion (WELLBUTRIN XL) 150 MG 24 hr tablet Take 150 mg by mouth every morning. 03/11/20  Yes [provider]  donepezil (ARICEPT) 10 MG tablet Take 10 mg by mouth daily at 8 pm.   Yes [provider]  doxycycline (VIBRAMYCIN) 100 MG capsule Take 100 mg by mouth daily. 08/09/22  Yes [provider]  Ferrous Gluconate 324 (37.5 Fe) MG TABS Take 324 mg by mouth 2 (two) times daily. (0800 & 2000)   Yes [provider]  fluticasone (FLONASE) 50 MCG/ACT nasal spray Place 1 spray into both nostrils daily. (0800)   Yes [provider]  Fluticasone Furoate-Vilanterol (BREO ELLIPTA) 200-25 MCG/INH AEPB Inhale 1 puff into the lungs daily. (0800)   Yes [provider]  furosemide (LASIX) 40 MG tablet Take 40 mg by mouth daily. (0800)   Yes [provider]  gabapentin (NEURONTIN) 300 MG capsule Take 300 mg by mouth 3 (three) times daily. 11/14/19  Yes [provider]  hydrocortisone 1 % ointment Apply 1 Application topically 3 (three) times daily. 06/21/22  Yes [provider]  isosorbide mononitrate (IMDUR) 30 MG 24 hr tablet Take 30 mg by mouth daily. (0800)   Yes [provider]  LATUDA 80 MG  TABS tablet Take 40 mg by mouth at bedtime. 11/26/19  Yes [provider]  memantine (NAMENDA) 10 MG tablet Take 10 mg by mouth 2 (two) times daily. 04/10/20  Yes [provider]  Menthol-Zinc Oxide (RISAMINE) 0.44-20.625 % OINT Apply 1 application topically 4 (four) times daily as needed (for rash/redness).   Yes [provider]  metoprolol succinate (TOPROL-XL) 50 MG 24 hr tablet Take 50 mg by mouth daily. (0800)Take with or immediately following a meal.   Yes [provider]   Olopatadine HCl (PATADAY) 0.2 % SOLN Place 1 drop into both eyes daily. (0800)   Yes [provider]  pantoprazole (PROTONIX) 40 MG tablet Take 1 tablet (40 mg total) by mouth 2 (two) times daily before a meal. 02/17/17  Yes Rehman, Mechele Dawley, MD  potassium chloride (KLOR-CON) 10 MEQ tablet Take 1 tablet by mouth daily. 04/10/20  Yes [provider]  tiZANidine (ZANAFLEX) 2 MG tablet Take 2 mg by mouth 2 (two) times daily. (0800 & 2000) 12/01/16  Yes [provider]  traZODone (DESYREL) 100 MG tablet Take 100 mg by mouth at bedtime. 04/10/20  Yes [provider]  venlafaxine XR (EFFEXOR-XR) 150 MG 24 hr capsule Take 300 mg by mouth daily. 04/10/20  Yes [provider]     Vital Signs: BP (!) 144/78 (BP Location: Right Arm)   Pulse 70   Temp 98.1 F (36.7 C) (Oral)   Resp 17   Ht '5\' 7"'$  (1.702 m)   Wt 173 lb 4.5 oz (78.6 kg)   SpO2 93%   BMI 27.14 kg/m   Physical Exam Constitutional:      Comments: Sleepy  Cardiovascular:     Rate and Rhythm: Normal rate.  Pulmonary:     Effort: Pulmonary effort is normal. No respiratory distress.  Abdominal:     Palpations: Abdomen is soft.     Tenderness: There is no abdominal tenderness.    Drain Location: RUQ Size: Fr size: 10 Fr Date of placement: 08/27/22  Currently to: Drain collection device: suction bulb 24 hour output:  Output by Drain (mL) 09/05/22 0701 - 09/05/22 1900 09/05/22 1901 - 09/06/22 0700 09/06/22 0701 - 09/06/22 1900 09/06/22 1901 - 09/07/22 0700 09/07/22 0701 - 09/07/22 1104  Closed System Drain 1 Lateral RUQ Bulb (JP) 10.2 Fr. '15 20 15 10    '$ Current examination: Flushes/aspirates easily.  Insertion site unremarkable. Suture and stat lock in place. Dressed appropriately.    Labs:  CBC: Recent Labs    09/04/22 0240 09/05/22 0247 09/06/22 0422 09/07/22 0306  WBC 19.8* 20.9* 21.2* 19.5*  HGB 11.1* 11.5* 11.5* 11.4*  HCT 34.5* 35.1* 34.9* 36.6  PLT 416* 420* 431* 455*     COAGS: Recent Labs    08/25/22 2018  INR 1.1    BMP: Recent Labs    09/04/22 0240 09/05/22 0247 09/06/22 0422 09/07/22 0306  NA 138 136 137 137  K 3.7 3.8 3.8 3.9  CL 108 105 105 105  CO2 20* 20* 22 23  GLUCOSE 126* 113* 107* 116*  BUN 7* '9 10 11  '$ CALCIUM 9.0 9.2 9.1 9.2  CREATININE 0.77 0.78 0.85 0.78  GFRNONAA >60 >60 >60 >60    LIVER FUNCTION TESTS: Recent Labs    09/04/22 0926 09/05/22 0247 09/06/22 0422 09/07/22 0306  BILITOT 0.1* 0.4 0.3 0.1*  AST 25 37 33 27  ALT '13 17 21 18  '$ ALKPHOS 60 64 69 71  PROT 6.4* 6.4* 6.6  7.0  ALBUMIN 2.3* 2.4* 2.4* 2.5*    Assessment and Plan:  Acute cholecystitis = s/p perc chole by Dr. Ky Barban- Abd  Drain injection done today showed continued occlusion of the common bile duct. (See dictation in imaging by Dr. Sandi Mariscal).  Switched to gravity bag today.  Continue flushes as ordered with 5 cc NS to maintain patency of the drain.  Record output Q shift. Dressing changes QD or PRN if soiled.   Call IR APP or on call IR MD if difficulty flushing or sudden change in drain output.   Discharge planning: Please contact IR APP or on call IR MD prior to patient d/c to ensure appropriate follow up plans are in place.   Percutaneous cholecystostomy drain to remain in place at least 6 weeks.   Recommend fluoroscopy with injection of the drain in IR to evaluate for patency of the cystic duct.  If the duct is patent and general surgery feels patient is stable for cholecystectomy, the drain would be removed at time of surgery.  If the duct is patent and general surgery feels patient is NEVER a candidate for cholecystectomy, drain can be capped for a trial.  If symptoms recur, then place to gravity bag again.  If trial is successful, discuss possible removal of the drain.  If trial in unsuccessful, then patient will need routine exchanges of the  chole tube about every 8-10 weeks.  Please call the IR PA at [phone] when  patient is about to be discharged and we will arrange the follow up drain injection (ok to leave message).   IR will continue to follow - please call with questions or concerns.  Electronically Signed: Murrell Redden, PA-C 09/07/2022, 10:59 AM    I spent a total of 25 Minutes at the the patient's bedside AND on the patient's hospital floor or unit, greater than 50% of which was counseling/coordinating care for perc chole f/u.

## 2022-09-07 NOTE — Progress Notes (Signed)
Daily Rounding Note  09/07/2022, 1:53 PM  LOS: 13 days   SUBJECTIVE:   Chief complaint:    cholecystitis.  Sudpected Choledocholithiasis  Back from Cholangiogram.  JP drain bag replaced w strait drain bag.  Intermittent R abd pain.  Just got xanax to address anxiety.  No N/V.  Brown unformed stools Perc biliary drain output: 25 Ml yesterday, minimal/>10 mL today.    OBJECTIVE:         Vital signs in last 24 hours:    Temp:  [98.1 F (36.7 C)-98.9 F (37.2 C)] 98.4 F (36.9 C) (01/03 1137) Pulse Rate:  [70-88] 88 (01/03 1137) Resp:  [13-17] 16 (01/03 1137) BP: (123-144)/(67-90) 133/90 (01/03 1137) SpO2:  [93 %-96 %] 93 % (01/03 1137) Last BM Date : 09/05/22 Filed Weights   08/30/22 1330 08/31/22 0600 09/03/22 0500  Weight: 85.4 kg 79.8 kg 78.6 kg   General: anxious, looks chronically unwell but not toxic.     Heart: RRR Chest: no cough or dyspnea Abdomen: soft, ND.  Active BS.  Minor tenderness in R mid to lower abdomen,  scant bloody liquid in drain bag.   Extremities: no CCE Neuro/Psych:  Anxious and near tears at times.  Restless leg type Leg movement.  Alert, appropriate.    Intake/Output from previous day: 01/02 0701 - 01/03 0700 In: 2360.6 [P.O.:1080; I.V.:1050.8; IV Piggyback:224.8] Out: 775 [Urine:750; Drains:25]  Intake/Output this shift: Total I/O In: 120 [P.O.:120] Out: -   Lab Results: Recent Labs    09/05/22 0247 09/06/22 0422 09/07/22 0306  WBC 20.9* 21.2* 19.5*  HGB 11.5* 11.5* 11.4*  HCT 35.1* 34.9* 36.6  PLT 420* 431* 455*   BMET Recent Labs    09/05/22 0247 09/06/22 0422 09/07/22 0306  NA 136 137 137  K 3.8 3.8 3.9  CL 105 105 105  CO2 20* 22 23  GLUCOSE 113* 107* 116*  BUN _0 CREATININE 0.78 0.85 0.78  CALCIUM 9.2 9.1 9.2   LFT Recent Labs    09/05/22 0247 09/06/22 0422 09/07/22 0306  PROT 6.4* 6.6 7.0  ALBUMIN 2.4* 2.4* 2.5*  AST 37 33 27  ALT _1 ALKPHOS 64 69 71  BILITOT 0.4 0.3 0.1*   PT/INR No results for input(s): "LABPROT", "INR" in the last 72 hours. Hepatitis Panel No results for input(s): "HEPBSAG", "HCVAB", "HEPAIGM", "HEPBIGM" in the last 72 hours.  Studies/Results: DG CHOLANGIOGRAM  EXISTING TUBE  Result Date: 09/07/2022 INDICATION: History of acute cholecystitis, post ultrasound fluoroscopic guided cholecystostomy tube placement on 08/27/2022. Patient with persistent right upper quadrant pain and leukocytosis. As such, patient presents now for cholangiogram via existing cholecystostomy tube. EXAM: FLUOROSCOPIC GUIDED CHOLECYSTOSTOMY TUBE INJECTION COMPARISON:  Image guided cholecystostomy tube placement-08/27/2022 CT abdomen and pelvis-09/04/2022 Nuclear medicine HIDA scan-08/26/2022 MRCP-08/26/2022 Right upper quadrant abdominal ultrasound-08/26/2022 MEDICATIONS: None ANESTHESIA/SEDATION: None CONTRAST:  24m OMNIPAQUE IOHEXOL 300 MG/ML SOLN - administered into the gallbladder fossa. FLUOROSCOPY TIME:  6 seconds (2.8 mGy) COMPLICATIONS: None immediate. PROCEDURE: The patient was positioned supine on the fluoroscopy table. A preprocedural spot fluoroscopic image was obtained of the right upper abdominal quadrant existing cholecystostomy tube. Multiple spot fluoroscopic radiographic images were obtained of the right upper abdominal quadrant an existing cholecystostomy tube following injection of a small amount of contrast. Images were reviewed and discussed with the patient. The cholecystostomy tube was flushed with a small amount of saline and reconnected to a gravity bag. A dressing  was placed. The patient tolerated the procedure well without immediate postprocedural complication. FINDINGS: Preprocedural spot fluoroscopic image of the right upper abdominal quadrant demonstrates grossly unchanged positioning of the cholecystostomy tube with end coiled and locked overlying the expected location of the gallbladder fundus. Subsequent  contrast injection demonstrates appropriate functionality of the cholecystostomy tube with brisk opacification of the gallbladder. While there are no discrete filling defects within the gallbladder lumen, there is persistent complete occlusion of the mid aspect of the cystic duct without opacification of the CBD. IMPRESSION: 1. Appropriately positioned and functioning cholecystostomy tube. No exchange performed. 2. Persistent occlusion of the cystic duct without opacification of the CBD, presumably secondary to choledocholithiasis. PLAN: - Recommend maintaining cholecystostomy tube to gravity bag. - If interval cholecystectomy is not pursued, recommend repeat cholangiogram and cholecystostomy tube exchange in 6-8 weeks following initial placement. Electronically Signed   By: Sandi Mariscal M.D.   On: 09/07/2022 12:28    Scheduled Meds:  apixaban  5 mg Oral BID   atorvastatin  40 mg Oral QHS   benztropine  0.5 mg Oral BID   buPROPion  150 mg Oral q morning   Chlorhexidine Gluconate Cloth  6 each Topical Daily   donepezil  10 mg Oral Q2000   feeding supplement  237 mL Oral TID WC   fluticasone  1 spray Each Nare Daily   fluticasone furoate-vilanterol  1 puff Inhalation Daily   gabapentin  300 mg Oral TID   isosorbide mononitrate  15 mg Oral Daily   lurasidone  80 mg Oral QHS   memantine  10 mg Oral BID   metoprolol succinate  12.5 mg Oral Daily   multivitamin with minerals  1 tablet Oral Daily   pantoprazole  40 mg Oral BID AC   sodium chloride flush  10-40 mL Intracatheter Q12H   sodium chloride flush  5 mL Intracatheter Q8H   tiZANidine  2 mg Oral BID   traZODone  100 mg Oral QHS   venlafaxine XR  300 mg Oral Daily   Continuous Infusions:  ceFEPime (MAXIPIME) IV 2 g (09/07/22 0449)   metronidazole 500 mg (09/07/22 1214)   PRN Meds:.albuterol, ALPRAZolam, guaiFENesin, iohexol, [DISCONTINUED] ondansetron **OR** ondansetron (ZOFRAN) IV, oxyCODONE, sodium chloride flush   ASSESMENT:    Acute cholecystitis.  08/27/2022 percutaneous cholecystostomy tube placement.  Continues on cefepime, Flagyl day 13.    ?choledocholithiasis.  Filling defects on ultrasound and MRCP.  CBD dilated on ultrasound but not on CT.  Patent CBD on HIDA. Dr. Rush Landmark saw no indication for EUS as of 1/2 (not aware of today's cholangiogram).   Cholangiogram through cholecystostomy tube reveals appropriately positioned and functioning cholecystostomy tube with brisk entry of contrast into the gallbladder, persistent occlusion at cystic duct without opacification of CBD, presumably secondary to choledocholithiasis.  No elevation T. bili, alk phos or transaminases.  Right adnexal and right anterior lateral abscesses.  Surgery does not feel percutaneous drainage necessary    A fib.  Chronic apixaban not on hold   PLAN     Continue current therapies.      Azucena Freed  09/07/2022, 1:53 PM Phone [phone]

## 2022-09-07 NOTE — Progress Notes (Addendum)
[facility] Progress Note     Subjective: CC:  Groggy this morning. Denies pain.   Objective: Vital signs in last 24 hours: Temp:  [98.1 F (36.7 C)-98.9 F (37.2 C)] 98.1 F (36.7 C) (01/03 0454) Pulse Rate:  [70-79] 70 (01/03 0454) Resp:  [13-17] 17 (01/03 0454) BP: (123-144)/(67-78) 144/78 (01/03 0454) SpO2:  [93 %-96 %] 93 % (01/03 0454) Last BM Date : 09/05/22  Intake/Output from previous day: 01/02 0701 - 01/03 0700 In: 2360.6 [P.O.:1080; I.V.:1050.8; IV Piggyback:224.8] Out: 775 [Urine:750; Drains:25] Intake/Output this shift: No intake/output data recorded.  PE: Gen:  Alert, NAD, chronically ill appearing Card:  Regular rate and rhythm Pulm:  Normal effort on nasal cannula  Abd: Soft, nondistended, nontender, perc chole in place to bulb drain with clear, colorless fluid.  Skin: warm and dry, no rashes  Psych: A&Ox3   Lab Results:  Recent Labs    09/06/22 0422 09/07/22 0306  WBC 21.2* 19.5*  HGB 11.5* 11.4*  HCT 34.9* 36.6  PLT 431* 455*    BMET Recent Labs    09/06/22 0422 09/07/22 0306  NA 137 137  K 3.8 3.9  CL 105 105  CO2 22 23  GLUCOSE 107* 116*  BUN 10 11  CREATININE 0.85 0.78  CALCIUM 9.1 9.2    PT/INR No results for input(s): "LABPROT", "INR" in the last 72 hours. CMP     Component Value Date/Time   NA 137 09/07/2022 0306   K 3.9 09/07/2022 0306   CL 105 09/07/2022 0306   CO2 23 09/07/2022 0306   GLUCOSE 116 (H) 09/07/2022 0306   BUN 11 09/07/2022 0306   CREATININE 0.78 09/07/2022 0306   CALCIUM 9.2 09/07/2022 0306   PROT 7.0 09/07/2022 0306   ALBUMIN 2.5 (L) 09/07/2022 0306   AST 27 09/07/2022 0306   ALT 18 09/07/2022 0306   ALKPHOS 71 09/07/2022 0306   BILITOT 0.1 (L) 09/07/2022 0306   GFRNONAA >60 09/07/2022 0306   GFRAA >60 04/15/2020 0413   Lipase     Component Value Date/Time   LIPASE 44 09/04/2022 0926       Studies/Results: No results found.  Anti-infectives: Anti-infectives (From  admission, onward)    Start     Dose/Rate Route Frequency Ordered Stop   08/29/22 1400  ceFEPIme (MAXIPIME) 2 g in sodium chloride 0.9 % 100 mL IVPB        2 g 200 mL/hr over 30 Minutes Intravenous Every 8 hours 08/29/22 0750     08/26/22 1800  ceFEPIme (MAXIPIME) 2 g in sodium chloride 0.9 % 100 mL IVPB  Status:  Discontinued        2 g 200 mL/hr over 30 Minutes Intravenous Every 12 hours 08/26/22 0756 08/29/22 0750   08/26/22 1200  metroNIDAZOLE (FLAGYL) IVPB 500 mg        500 mg 100 mL/hr over 60 Minutes Intravenous Every 12 hours 08/26/22 0136     08/26/22 0600  ceFEPIme (MAXIPIME) 2 g in sodium chloride 0.9 % 100 mL IVPB  Status:  Discontinued        2 g 200 mL/hr over 30 Minutes Intravenous Every 8 hours 08/26/22 0013 08/26/22 0756   08/25/22 2300  ceFEPIme (MAXIPIME) 2 g in sodium chloride 0.9 % 100 mL IVPB       See Hyperspace for full Linked Orders Report.   2 g 200 mL/hr over 30 Minutes Intravenous  Once 08/25/22 2252 08/26/22 0030   08/25/22 2300  metroNIDAZOLE (  FLAGYL) IVPB 500 mg       See Hyperspace for full Linked Orders Report.   500 mg 100 mL/hr over 60 Minutes Intravenous  Once 08/25/22 2252 08/26/22 0136        Assessment/Plan  Acute cholecystitis S/p percutaneous cholecystostomy tube placement 08/27/22  ?Choledocholithiasis - MRCP 08/26/2022 showed a distended gallbladder with sludge and diffuse wall thickening with pericholecystic edema without gallstones, mild diffuse intrahepatic biliary ductal dilatation with a dilated CBD duct 13 mm with a possible stone in the lower third of the CBD. LFTs are WNL - GI is following and planning cholangiogram per existing tube, which may not be diagnostic for choledocholithiasis is cystic duct is occluded. Persistent RUQ pain and leukocytosis -Pain resolved currently  -CT a/p from 12/31 w/ perc chole tube to be in correct position; it also reports a small 4.1 x 1.8 cm right anterolateral fluid collection as well as possible  developing fluid collection in the right adnexal region and measuring 1.9 x 1.6 cm. Reviewed with MD. This fluid collection is small and we would not recommend perc drainage at this time. Radiology reviewed and it is not amenable to drainage.  She is on antibiotics for her cholecystitis which will likely help if this is a small abscess. -WBC may be from her lungs; if no improvement would repeat imaging early next week   ID - currently maxipime and flagyl VTE - eliquis FEN - D1 diet, ensure TID Foley - none    LOS: 13 days   I reviewed nursing notes, Consultant GI notes, last 24 h vitals and pain scores, last 48 h intake and output, last 24 h labs and trends, and last 24 h imaging results.  This care required moderate level of medical decision making.   Clovis Riley MD [facility] Please see Amion for pager number during day hours 7:00am-4:30pm

## 2022-09-08 ENCOUNTER — Inpatient Hospital Stay: Payer: Self-pay

## 2022-09-08 DIAGNOSIS — G9341 Metabolic encephalopathy: Secondary | ICD-10-CM | POA: Diagnosis not present

## 2022-09-08 DIAGNOSIS — K810 Acute cholecystitis: Secondary | ICD-10-CM | POA: Diagnosis not present

## 2022-09-08 DIAGNOSIS — F03918 Unspecified dementia, unspecified severity, with other behavioral disturbance: Secondary | ICD-10-CM | POA: Diagnosis not present

## 2022-09-08 LAB — COMPREHENSIVE METABOLIC PANEL
ALT: 18 U/L (ref 0–44)
AST: 22 U/L (ref 15–41)
Albumin: 2.6 g/dL — ABNORMAL LOW (ref 3.5–5.0)
Alkaline Phosphatase: 66 U/L (ref 38–126)
Anion gap: 9 (ref 5–15)
BUN: 18 mg/dL (ref 8–23)
CO2: 22 mmol/L (ref 22–32)
Calcium: 9.4 mg/dL (ref 8.9–10.3)
Chloride: 106 mmol/L (ref 98–111)
Creatinine, Ser: 0.86 mg/dL (ref 0.44–1.00)
GFR, Estimated: >60 mL/min (ref >60)
Glucose, Bld: 127 mg/dL — ABNORMAL HIGH (ref 70–99)
Potassium: 3.8 mmol/L (ref 3.5–5.1)
Sodium: 137 mmol/L (ref 135–145)
Total Bilirubin: 0.3 mg/dL (ref 0.3–1.2)
Total Protein: 7 g/dL (ref 6.5–8.1)

## 2022-09-08 LAB — CBC WITH DIFFERENTIAL/PLATELET
Abs Immature Granulocytes: 0.32 10*3/uL — ABNORMAL HIGH (ref 0.00–0.07)
Basophils Absolute: 0.2 10*3/uL — ABNORMAL HIGH (ref 0.0–0.1)
Basophils Relative: 1 %
Eosinophils Absolute: 0.6 10*3/uL — ABNORMAL HIGH (ref 0.0–0.5)
Eosinophils Relative: 3 %
HCT: 38.6 % (ref 36.0–46.0)
Hemoglobin: 12.1 g/dL (ref 12.0–15.0)
Immature Granulocytes: 2 %
Lymphocytes Relative: 18 %
Lymphs Abs: 3.5 10*3/uL (ref 0.7–4.0)
MCH: 29 pg (ref 26.0–34.0)
MCHC: 31.3 g/dL (ref 30.0–36.0)
MCV: 92.6 fL (ref 80.0–100.0)
Monocytes Absolute: 1.7 10*3/uL — ABNORMAL HIGH (ref 0.1–1.0)
Monocytes Relative: 9 %
Neutro Abs: 12.9 10*3/uL — ABNORMAL HIGH (ref 1.7–7.7)
Neutrophils Relative %: 67 %
Platelets: 472 10*3/uL — ABNORMAL HIGH (ref 150–400)
RBC: 4.17 MIL/uL (ref 3.87–5.11)
RDW: 15.2 % (ref 11.5–15.5)
WBC: 19.1 10*3/uL — ABNORMAL HIGH (ref 4.0–10.5)
nRBC: 0 % (ref 0.0–0.2)

## 2022-09-08 LAB — GLUCOSE, CAPILLARY: Glucose-Capillary: 116 mg/dL — ABNORMAL HIGH (ref 70–99)

## 2022-09-08 LAB — MAGNESIUM: Magnesium: 2.1 mg/dL (ref 1.7–2.4)

## 2022-09-08 MED ORDER — ORAL CARE MOUTH RINSE: 15.0000 mL | OROMUCOSAL | Status: DC | PRN

## 2022-09-08 NOTE — Progress Notes (Signed)
Referring Physician(s): Clanford Wynetta Emery  Supervising Physician: Sandi Mariscal  Patient Status:  [facility] - In-pt  Chief Complaint: Cholecystitis   Subjective: Sleeping at time of visit.  Awakens with assessment but falls back asleep.  Perc chole in place.   Allergies: Penicillins  Medications: Prior to Admission medications   Medication Sig Start Date End Date Taking? Authorizing Provider  acetaminophen (TYLENOL) 650 MG CR tablet Take 650 mg by mouth every 8 (eight) hours as needed for pain.   Yes [provider]  albuterol (PROVENTIL HFA;VENTOLIN HFA) 108 (90 BASE) MCG/ACT inhaler Inhale 2 puffs into the lungs 4 (four) times daily. (0800, 1200, 1600, & 2000)   Yes [provider]  ALPRAZolam (XANAX) 1 MG tablet Take 0.5 tablets (0.5 mg total) by mouth 3 (three) times daily as needed for anxiety. (0800, 1400, & 2000) Patient taking differently: Take 0.5 mg by mouth 2 (two) times daily. (0800 & 2000) 04/24/17  Yes Rosita Fire, MD  apixaban (ELIQUIS) 5 MG TABS tablet Take 1 tablet (5 mg total) by mouth 2 (two) times daily. 04/15/20  Yes Tat, Shanon Brow, MD  atorvastatin (LIPITOR) 10 MG tablet Take 1 tablet (10 mg total) by mouth daily at 8 pm. Resume on 04/22/20 if LFTs are back to normal Patient taking differently: Take 40 mg by mouth daily at 8 pm. 04/15/20  Yes Tat, Shanon Brow, MD  benztropine (COGENTIN) 1 MG tablet Take 0.5 mg by mouth 2 (two) times daily. 04/10/20  Yes [provider]  buPROPion (WELLBUTRIN XL) 150 MG 24 hr tablet Take 150 mg by mouth every morning. 03/11/20  Yes [provider]  donepezil (ARICEPT) 10 MG tablet Take 10 mg by mouth daily at 8 pm.   Yes [provider]  doxycycline (VIBRAMYCIN) 100 MG capsule Take 100 mg by mouth daily. 08/09/22  Yes [provider]  Ferrous Gluconate 324 (37.5 Fe) MG TABS Take 324 mg by mouth 2 (two) times daily. (0800 & 2000)   Yes [provider]  fluticasone (FLONASE) 50  MCG/ACT nasal spray Place 1 spray into both nostrils daily. (0800)   Yes [provider]  Fluticasone Furoate-Vilanterol (BREO ELLIPTA) 200-25 MCG/INH AEPB Inhale 1 puff into the lungs daily. (0800)   Yes [provider]  furosemide (LASIX) 40 MG tablet Take 40 mg by mouth daily. (0800)   Yes [provider]  gabapentin (NEURONTIN) 300 MG capsule Take 300 mg by mouth 3 (three) times daily. 11/14/19  Yes [provider]  hydrocortisone 1 % ointment Apply 1 Application topically 3 (three) times daily. 06/21/22  Yes [provider]  isosorbide mononitrate (IMDUR) 30 MG 24 hr tablet Take 30 mg by mouth daily. (0800)   Yes [provider]  LATUDA 80 MG TABS tablet Take 40 mg by mouth at bedtime. 11/26/19  Yes [provider]  memantine (NAMENDA) 10 MG tablet Take 10 mg by mouth 2 (two) times daily. 04/10/20  Yes [provider]  Menthol-Zinc Oxide (RISAMINE) 0.44-20.625 % OINT Apply 1 application topically 4 (four) times daily as needed (for rash/redness).   Yes [provider]  metoprolol succinate (TOPROL-XL) 50 MG 24 hr tablet Take 50 mg by mouth daily. (0800)Take with or immediately following a meal.   Yes [provider]  Olopatadine HCl (PATADAY) 0.2 % SOLN Place 1 drop into both eyes daily. (0800)   Yes [provider]  pantoprazole (PROTONIX) 40 MG tablet Take 1 tablet (40 mg total) by mouth  2 (two) times daily before a meal. 02/17/17  Yes Rehman, Mechele Dawley, MD  potassium chloride (KLOR-CON) 10 MEQ tablet Take 1 tablet by mouth daily. 04/10/20  Yes [provider]  tiZANidine (ZANAFLEX) 2 MG tablet Take 2 mg by mouth 2 (two) times daily. (0800 & 2000) 12/01/16  Yes [provider]  traZODone (DESYREL) 100 MG tablet Take 100 mg by mouth at bedtime. 04/10/20  Yes [provider]  venlafaxine XR (EFFEXOR-XR) 150 MG 24 hr capsule Take 300 mg by mouth daily. 04/10/20  Yes [provider]     Vital Signs: BP (!) 142/91 (BP Location: Right Arm)   Pulse 94   Temp 98.2 F (36.8 C) (Oral)   Resp 17   Ht '5\' 7"'$  (1.702 m)   Wt 173 lb 4.5 oz (78.6 kg)   SpO2 94%   BMI 27.14 kg/m   Physical Exam Constitutional:      Comments: Sleepy  Cardiovascular:     Rate and Rhythm: Normal rate.  Pulmonary:     Effort: Pulmonary effort is normal. No respiratory distress.  Abdominal:     Palpations: Abdomen is soft.     Tenderness: There is no abdominal tenderness.     Comments: Perc chole in place. Thin, clear, serosanginuous output.  FLushes easily.     Drain Location: RUQ Size: Fr size: 10 Fr Date of placement: 08/27/22  Currently to: Drain collection device: gravity 24 hour output:  Output by Drain (mL) 09/06/22 0701 - 09/06/22 1900 09/06/22 1901 - 09/07/22 0700 09/07/22 0701 - 09/07/22 1900 09/07/22 1901 - 09/08/22 0700 09/08/22 0701 - 09/08/22 1601  Closed System Drain 1 Lateral RUQ Bulb (JP) 10.2 Fr. 15 10      Current examination: Flushes/aspirates easily.  Insertion site unremarkable. Suture and stat lock in place. Dressed appropriately.    Labs:  CBC: Recent Labs    09/05/22 0247 09/06/22 0422 09/07/22 0306 09/08/22 0307  WBC 20.9* 21.2* 19.5* 19.1*  HGB 11.5* 11.5* 11.4* 12.1  HCT 35.1* 34.9* 36.6 38.6  PLT 420* 431* 455* 472*     COAGS: Recent Labs    08/25/22 2018  INR 1.1     BMP: Recent Labs    09/05/22 0247 09/06/22 0422 09/07/22 0306 09/08/22 0307  NA 136 137 137 137  K 3.8 3.8 3.9 3.8  CL 105 105 105 106  CO2 20* '22 23 22  '$ GLUCOSE 113* 107* 116* 127*  BUN '9 10 11 18  '$ CALCIUM 9.2 9.1 9.2 9.4  CREATININE 0.78 0.85 0.78 0.86  GFRNONAA >60 >60 >60 >60     LIVER FUNCTION TESTS: Recent Labs    09/05/22 0247 09/06/22 0422 09/07/22 0306 09/08/22 0307  BILITOT 0.4 0.3 0.1* 0.3  AST 37 33 27 22  ALT '17 21 18 18  '$ ALKPHOS 64 69 71 66  PROT 6.4* 6.6 7.0 7.0  ALBUMIN 2.4* 2.4* 2.5* 2.6*     Assessment  and Plan: Acute cholecystitis s/p perc chole by Dr. Ky Barban- Abd 12/23 Switched to gravity bag yesterday with small amount of drainage in bag noted today.  Thin, blood-tinged.   Continue flushes as ordered with 5 cc NS to maintain patency of the drain.  Record output Q shift. Dressing changes QD or PRN if soiled.   Call IR APP or on call IR MD if difficulty flushing or sudden change in drain output.   Discharge planning: Please contact IR APP or on call IR MD prior to patient d/c  to ensure appropriate follow up plans are in place.   Electronically Signed: Docia Barrier, PA 09/08/2022, 4:01 PM    I spent a total of 25 Minutes at the the patient's bedside AND on the patient's hospital floor or unit, greater than 50% of which was counseling/coordinating care for acute cholecystitis.

## 2022-09-08 NOTE — Progress Notes (Signed)
This nurse assessed bilateral upper extremities. This patient has had 7 PIVs has pulled multiple PIVs including a midline. At this time it is recommended to place a sutured CVC line. Or if possible change infusions to oral administration. Discussed with Engineer, agricultural. Fran Lowes, RN VAST

## 2022-09-08 NOTE — Progress Notes (Signed)
Progress Note  Primary GI: Unassigned, previously Dr. Laural Golden   Subjective  Chief Complaint: Cholecystitis, suspected choledocholithiasis with normal LFTs.  Serosanguineous fluid in drain bag. Patient complaining she is tired, intermittent right upper abdominal pain.  No nausea or vomiting but she has tray at her bedside not eating much. Brown loose stools yesterday.    Objective   Vital signs in last 24 hours: Temp:  [98.2 F (36.8 C)] 98.2 F (36.8 C) (01/04 0700) Pulse Rate:  [94] 94 (01/04 0700) Resp:  [17] 17 (01/04 0700) BP: (142)/(91) 142/91 (01/04 0700) SpO2:  [94 %] 94 % (01/04 0700) Weight:  [78.6 kg] 78.6 kg (01/04 0500) Last BM Date : 09/08/22 Last BM recorded by nurses in past 5 days Stool Type: Type 7 (Liquid consistency with no solid pieces) (09/07/2022  5:10 AM)  General:   female anxious, chronically ill-appearing Heart:  Regular rate and rhythm; Pulm: Clear anteriorly; no wheezing Abdomen:  Soft, Obese AB, Active bowel sounds. mild tenderness in the RUQ at drain site.  Scant serosanguineous liquid in drain. Extremities:  without  edema. Neurologic: Patient A&O x 1, no focal neurodeficits Psych: Anxious, tearful  Intake/Output from previous day: 01/03 0701 - 01/04 0700 In: 120 [P.O.:120] Out: -  Intake/Output this shift: No intake/output data recorded.  Studies/Results: DG CHEST PORT 1 VIEW  Result Date: 09/07/2022 CLINICAL DATA:  Leukocytosis. EXAM: PORTABLE CHEST 1 VIEW COMPARISON:  Chest x-ray 08/31/2022 FINDINGS: Underinflation. Calcified aorta. Normal cardiopericardial silhouette. Decreasing bilateral lung opacities. Some residual opacity left lung base. Mild right basilar atelectasis as well. Recommend continued follow-up. No pneumothorax or effusion. IMPRESSION: Improving bilateral lung opacities. Some residual at the bases. Recommend continued follow-up Electronically Signed   By: Jill Side M.D.   On: 09/07/2022 18:26   DG CHOLANGIOGRAM   EXISTING TUBE  Result Date: 09/07/2022 INDICATION: History of acute cholecystitis, post ultrasound fluoroscopic guided cholecystostomy tube placement on 08/27/2022. Patient with persistent right upper quadrant pain and leukocytosis. As such, patient presents now for cholangiogram via existing cholecystostomy tube. EXAM: FLUOROSCOPIC GUIDED CHOLECYSTOSTOMY TUBE INJECTION COMPARISON:  Image guided cholecystostomy tube placement-08/27/2022 CT abdomen and pelvis-09/04/2022 Nuclear medicine HIDA scan-08/26/2022 MRCP-08/26/2022 Right upper quadrant abdominal ultrasound-08/26/2022 MEDICATIONS: None ANESTHESIA/SEDATION: None CONTRAST:  35m OMNIPAQUE IOHEXOL 300 MG/ML SOLN - administered into the gallbladder fossa. FLUOROSCOPY TIME:  6 seconds (2.8 mGy) COMPLICATIONS: None immediate. PROCEDURE: The patient was positioned supine on the fluoroscopy table. A preprocedural spot fluoroscopic image was obtained of the right upper abdominal quadrant existing cholecystostomy tube. Multiple spot fluoroscopic radiographic images were obtained of the right upper abdominal quadrant an existing cholecystostomy tube following injection of a small amount of contrast. Images were reviewed and discussed with the patient. The cholecystostomy tube was flushed with a small amount of saline and reconnected to a gravity bag. A dressing was placed. The patient tolerated the procedure well without immediate postprocedural complication. FINDINGS: Preprocedural spot fluoroscopic image of the right upper abdominal quadrant demonstrates grossly unchanged positioning of the cholecystostomy tube with end coiled and locked overlying the expected location of the gallbladder fundus. Subsequent contrast injection demonstrates appropriate functionality of the cholecystostomy tube with brisk opacification of the gallbladder. While there are no discrete filling defects within the gallbladder lumen, there is persistent complete occlusion of the mid aspect of  the cystic duct without opacification of the CBD. IMPRESSION: 1. Appropriately positioned and functioning cholecystostomy tube. No exchange performed. 2. Persistent occlusion of the cystic duct without opacification of the CBD, presumably secondary  to choledocholithiasis. PLAN: - Recommend maintaining cholecystostomy tube to gravity bag. - If interval cholecystectomy is not pursued, recommend repeat cholangiogram and cholecystostomy tube exchange in 6-8 weeks following initial placement. Electronically Signed   By: Sandi Mariscal M.D.   On: 09/07/2022 12:28    Lab Results: Recent Labs    09/06/22 0422 09/07/22 0306 09/08/22 0307  WBC 21.2* 19.5* 19.1*  HGB 11.5* 11.4* 12.1  HCT 34.9* 36.6 38.6  PLT 431* 455* 472*   BMET Recent Labs    09/06/22 0422 09/07/22 0306 09/08/22 0307  NA 137 137 137  K 3.8 3.9 3.8  CL 105 105 106  CO2 '22 23 22  '$ GLUCOSE 107* 116* 127*  BUN '10 11 18  '$ CREATININE 0.85 0.78 0.86  CALCIUM 9.1 9.2 9.4   LFT Recent Labs    09/08/22 0307  PROT 7.0  ALBUMIN 2.6*  AST 22  ALT 18  ALKPHOS 66  BILITOT 0.3   PT/INR No results for input(s): "LABPROT", "INR" in the last 72 hours.   Scheduled Meds:  apixaban  5 mg Oral BID   atorvastatin  40 mg Oral QHS   benztropine  0.5 mg Oral BID   buPROPion  150 mg Oral q morning   Chlorhexidine Gluconate Cloth  6 each Topical Daily   donepezil  10 mg Oral Q2000   feeding supplement  237 mL Oral TID WC   fluticasone  1 spray Each Nare Daily   fluticasone furoate-vilanterol  1 puff Inhalation Daily   gabapentin  300 mg Oral TID   isosorbide mononitrate  15 mg Oral Daily   lurasidone  80 mg Oral QHS   memantine  10 mg Oral BID   metoprolol succinate  25 mg Oral Daily   multivitamin with minerals  1 tablet Oral Daily   pantoprazole  40 mg Oral BID AC   sodium chloride flush  10-40 mL Intracatheter Q12H   sodium chloride flush  5 mL Intracatheter Q8H   tiZANidine  2 mg Oral BID   traZODone  100 mg Oral QHS    venlafaxine XR  300 mg Oral Daily   Continuous Infusions:  cefTRIAXone (ROCEPHIN)  IV 2 g (09/07/22 2234)   metronidazole 500 mg (09/08/22 1246)      Patient profile:   72 year old female with history of HFpE, A-fib on Eliquis, COPD, dementia, GERD, gastric ulcers, and duodenal stenosis presented with AMS, found to have acute cholecystitis s/p PCT on 08/27/22.    Impression/Plan:   Acute cholecystitis Status post PCT 08/27/2022 with IR Continues on cefepime and Flagyl Filling defects on ultrasound and MRCP, cholangiogram revealed appropriately positioned tube functioning persistent occlusion of cystic duct without opacification of CBD. Patient has no elevated LFTs, continues to be normal. White blood cell count 19.1 Discussed case with Dr. Rush Landmark physician no evidence of any filling defect of CBD could be choledocholithiasis versus 6 duct stone because of cholecystitis. Discussed Eliquis washout with EUS ERCP in the hospital for confirmation versus in setting of her normal LFTs having the patient repeat MRCP in 4 to 6 weeks and then proceeding with ERCP if still CBD stone concern. There is no family at bedside, per Marya Amsler, the Education officer, museum, he has been discussing care with her son son Cheyanna Strick.  I called and left a message at [phone].  Will discuss options with the patient Son due to patient's dementia.  Right adnexal and right anterior lateral abscesses Sure does not feel percutaneous drainage necessary  at this time  A-fib Last dose of Eliquis this morning, currently not on hold.  Leukocytosis Chest x-ray continuing improving bilateral lower lung opacities Blood culture negative day 5  Principal Problem:   Acute cholecystitis Active Problems:   Bipolar disorder (HCC)   High cholesterol   GERD (gastroesophageal reflux disease)   COPD (chronic obstructive pulmonary disease) (HCC)   Essential hypertension   Diastolic dysfunction with heart failure (HCC)   Atrial  fibrillation, chronic (HCC)   Dementia with behavioral disturbance ([place])   Acute metabolic encephalopathy   Sepsis ([place])    LOS: 14 days   Vladimir Crofts  09/08/2022, 1:32 PM

## 2022-09-08 NOTE — Progress Notes (Signed)
Progress Note    Kathleen Valenzuela   GEZ:[id number]  DOB: Mar 20, 1951  DOA: 08/25/2022     14 PCP: Monico Blitz, MD  Initial CC: AMS  Hospital Course: Kathleen Valenzuela is a 72 yo female with PMH chronic anemia, anxiety, bipolar 1 disorder, COPD, GERD, heart murmur, HLD, HTN who presented with AMS.  She presented from her SNF to the hospital. She underwent workup with CT, ultrasound, and MRCP.  Findings were consistent with a distended sludge-filled gallbladder with diffuse wall thickening and pericholecystic edema.  She underwent evaluation with GI and general surgery.  She underwent percutaneous tube placement on 08/27/2022. Repeat imaging was performed on 09/04/2022 and showed interval development of a right anterolateral abscess formation and second abscess formation in the right adnexal region.  Interval History:  No events overnight. Still confused some this morning which I think is baseline dementia.  Unfortunately, pulled midline out this afternoon; we'll try a PICC since looks like still not improving clinically.   Assessment and Plan:  Sepsis due to acute cholecystitis - Had extensive workup with CT abdomen/pelvis, HIDA scan and MRCP.  Seen by [facility]/ IR and transferred from [facility] to [facility] - cholecystostomy - performed on 08/27/2022 - S/P ultrasound as well as CT abdomen 12/31 " Interval development of a 4.1 x 1.8 cm right anterolateral abscess formation. Likely second abscess forming measuring 1.9 x 1.6 cm in the region of the right adnexa. Cholecystostomy tube in good position with decompressed gallbladder lumen."  - blood cultures remained negative; Ucx insignificant growth but treated likely by default - has been on cefepime and flagyl since 12/21 and continues to have persistent leukocytosis - given ongoing "confusion" and persistent leukocytosis, I would like to try alternative abx to avoid confounding of encephalopathy from cefepime and see if it helps WBC possibly too - d/c cefepime, start  rocephin; goal is Zosyn but given her possible rash rxn to PCN will order pcn skin testing first also and if negative, then will d/c allergy on profile and switch - continue flagyl for now  Leukocytosis - see above as well - still feel like abdomen is source. CXR repeated 1/3 shows improvement from prior even - given persistent leukocytosis still, will obtain repeat MRCP at this time to re-evaluate abscesses or any other changes  Acute metabolic encephalopathy -Suspected multifactorial from sepsis and underlying dementia.  Also has been on prolonged cefepime; see reasoning above for antibiotic transition  Dementia with behavioral disturbances Bipolar disorder -Oriented to name and president only -Continue Namenda and Aricept - Continue Cogentin, Wellbutrin, trazodone, Effexor  Acute hypoxic respiratory failure -resolved - on RA Acute on chronic diastolic CHF - resolved -Now euvolemic Normocytic anemia - Baseline hemoglobin around 12 to 13 g/dL Thrombocytopenia resolved. Chronic atrial fibrillation continue Eliquis and metoprolol Hypokalemia - replete as needed GERD:Continue PPI Dysphagia: speech input appreciated diet changed to dysphagia 1   Old records reviewed in assessment of this patient  Antimicrobials: Cefepime 08/26/2022 >> 09/07/22 Flagyl 08/26/2022 >> current Rocephin 09/07/21 >> current   DVT prophylaxis:  SCDs Start: 08/26/22 0137 apixaban (ELIQUIS) tablet 5 mg   Code Status:   Code Status: Full Code  Mobility Assessment (last 72 hours)     Mobility Assessment     Row Name 09/08/22 0900 09/07/22 1955 09/07/22 1500 09/07/22 0830 09/06/22 1930   Does patient have an order for bedrest or is patient medically unstable No - Continue assessment No - Continue assessment -- No - Continue assessment No - Continue  assessment   What is the highest level of mobility based on the progressive mobility assessment? Level 3 (Stands with assist) - Balance while standing  and cannot  march in place Level 2 (Chairfast) - Balance while sitting on edge of bed and cannot stand Level 2 (Chairfast) - Balance while sitting on edge of bed and cannot stand Level 3 (Stands with assist) - Balance while standing  and cannot march in place Level 3 (Stands with assist) - Balance while standing  and cannot march in place   Is the above level different from baseline mobility prior to current illness? Yes - Recommend PT order Yes - Recommend PT order -- Yes - Recommend PT order Yes - Recommend PT order    [place] Name 09/06/22 0730 09/06/22 0004         Does patient have an order for bedrest or is patient medically unstable No - Continue assessment No - Continue assessment      What is the highest level of mobility based on the progressive mobility assessment? Level 3 (Stands with assist) - Balance while standing  and cannot march in place Level 3 (Stands with assist) - Balance while standing  and cannot march in place      Is the above level different from baseline mobility prior to current illness? Yes - Recommend PT order Yes - Recommend PT order               Barriers to discharge:  Disposition Plan:  SNF Status is: Inpt  Objective: Blood pressure (!) 142/91, pulse 94, temperature 98.2 F (36.8 C), temperature source Oral, resp. rate 17, height '5\' 7"'$  (1.702 m), weight 78.6 kg, SpO2 94 %.  Examination:  Physical Exam Constitutional:      Comments: Pleasant and demented elderly woman resting in bed in no distress  HENT:     Head: Normocephalic and atraumatic.  Eyes:     Extraocular Movements: Extraocular movements intact.  Cardiovascular:     Rate and Rhythm: Normal rate and regular rhythm.  Pulmonary:     Effort: No respiratory distress.     Breath sounds: Normal breath sounds. No wheezing.  Abdominal:     General: Bowel sounds are normal. There is no distension.     Palpations: Abdomen is soft.     Tenderness: There is no abdominal tenderness.  Musculoskeletal:         General: Normal range of motion.     Cervical back: Normal range of motion and neck supple.     Comments: Left midline noted in arm  Skin:    General: Skin is warm and dry.  Neurological:     Comments: Oriented only to name and president.  Moves all 4 extremities and follows commands      Consultants:  GI General surgery  Procedures:  08/27/22: PCT to RUQ  Data Reviewed: Results for orders placed or performed during the hospital encounter of 08/25/22 (from the past 24 hour(s))  Comprehensive metabolic panel     Status: Abnormal   Collection Time: 09/08/22  3:07 AM  Result Value Ref Range   Sodium 137 135 - 145 mmol/L   Potassium 3.8 3.5 - 5.1 mmol/L   Chloride 106 98 - 111 mmol/L   CO2 22 22 - 32 mmol/L   Glucose, Bld 127 (H) 70 - 99 mg/dL   BUN 18 8 - 23 mg/dL   Creatinine, Ser 0.86 0.44 - 1.00 mg/dL   Calcium 9.4 8.9 - 10.3  mg/dL   Total Protein 7.0 6.5 - 8.1 g/dL   Albumin 2.6 (L) 3.5 - 5.0 g/dL   AST 22 15 - 41 U/L   ALT 18 0 - 44 U/L   Alkaline Phosphatase 66 38 - 126 U/L   Total Bilirubin 0.3 0.3 - 1.2 mg/dL   GFR, Estimated >60 >60 mL/min   Anion gap 9 5 - 15  CBC with Differential/Platelet     Status: Abnormal   Collection Time: 09/08/22  3:07 AM  Result Value Ref Range   WBC 19.1 (H) 4.0 - 10.5 K/uL   RBC 4.17 3.87 - 5.11 MIL/uL   Hemoglobin 12.1 12.0 - 15.0 g/dL   HCT 38.6 36.0 - 46.0 %   MCV 92.6 80.0 - 100.0 fL   MCH 29.0 26.0 - 34.0 pg   MCHC 31.3 30.0 - 36.0 g/dL   RDW 15.2 11.5 - 15.5 %   Platelets 472 (H) 150 - 400 K/uL   nRBC 0.0 0.0 - 0.2 %   Neutrophils Relative % 67 %   Neutro Abs 12.9 (H) 1.7 - 7.7 K/uL   Lymphocytes Relative 18 %   Lymphs Abs 3.5 0.7 - 4.0 K/uL   Monocytes Relative 9 %   Monocytes Absolute 1.7 (H) 0.1 - 1.0 K/uL   Eosinophils Relative 3 %   Eosinophils Absolute 0.6 (H) 0.0 - 0.5 K/uL   Basophils Relative 1 %   Basophils Absolute 0.2 (H) 0.0 - 0.1 K/uL   Immature Granulocytes 2 %   Abs Immature Granulocytes 0.32 (H)  0.00 - 0.07 K/uL  Magnesium     Status: None   Collection Time: 09/08/22  3:07 AM  Result Value Ref Range   Magnesium 2.1 1.7 - 2.4 mg/dL  Glucose, capillary     Status: Abnormal   Collection Time: 09/08/22 11:26 AM  Result Value Ref Range   Glucose-Capillary 116 (H) 70 - 99 mg/dL    I have Reviewed nursing notes, Vitals, and Lab results since pt's last encounter. Pertinent lab results : see above I have ordered labwork to follow up on.  I have reviewed the last note from staff over past 24 hours I have discussed pt's care plan and test results with nursing staff, CM/SW, and other staff as appropriate  Time spent: Greater than 50% of the 55 minute visit was spent in counseling/coordination of care for the patient as laid out in the A&P.   LOS: 14 days   Dwyane Dee, MD [facility] 09/08/2022, 5:51 PM

## 2022-09-08 NOTE — Progress Notes (Signed)
[facility] Progress Note     Subjective: Awoke from sleep.  Does admit to some RUQ pain around her drain.  Unclear if she is eating.  Currently has a tray in front of her but hasn't eaten anything as she has been sleeping.  Objective: Vital signs in last 24 hours: Temp:  [98.2 F (36.8 C)-98.4 F (36.9 C)] 98.2 F (36.8 C) (01/04 0700) Pulse Rate:  [88-94] 94 (01/04 0700) Resp:  [16-17] 17 (01/04 0700) BP: (133-142)/(90-91) 142/91 (01/04 0700) SpO2:  [93 %-94 %] 94 % (01/04 0700) Weight:  [78.6 kg] 78.6 kg (01/04 0500) Last BM Date : 09/05/22  Intake/Output from previous day: 01/03 0701 - 01/04 0700 In: 120 [P.O.:120] Out: -  Intake/Output this shift: No intake/output data recorded.  PE: Gen:  sleepy, NAD, chronically ill appearing Card:  Regular rate and rhythm Pulm:  Normal effort on nasal cannula  Abd: Soft, nondistended, mildly tender as expected around perc chole in place to gravity bag.  Serosang output.  Rest of abdomen is nontender   Lab Results:  Recent Labs    09/07/22 0306 09/08/22 0307  WBC 19.5* 19.1*  HGB 11.4* 12.1  HCT 36.6 38.6  PLT 455* 472*   BMET Recent Labs    09/07/22 0306 09/08/22 0307  NA 137 137  K 3.9 3.8  CL 105 106  CO2 23 22  GLUCOSE 116* 127*  BUN 11 18  CREATININE 0.78 0.86  CALCIUM 9.2 9.4   PT/INR No results for input(s): "LABPROT", "INR" in the last 72 hours. CMP     Component Value Date/Time   NA 137 09/08/2022 0307   K 3.8 09/08/2022 0307   CL 106 09/08/2022 0307   CO2 22 09/08/2022 0307   GLUCOSE 127 (H) 09/08/2022 0307   BUN 18 09/08/2022 0307   CREATININE 0.86 09/08/2022 0307   CALCIUM 9.4 09/08/2022 0307   PROT 7.0 09/08/2022 0307   ALBUMIN 2.6 (L) 09/08/2022 0307   AST 22 09/08/2022 0307   ALT 18 09/08/2022 0307   ALKPHOS 66 09/08/2022 0307   BILITOT 0.3 09/08/2022 0307   GFRNONAA >60 09/08/2022 0307   GFRAA >60 04/15/2020 0413   Lipase     Component Value Date/Time   LIPASE 44  09/04/2022 0926       Studies/Results: DG CHEST PORT 1 VIEW  Result Date: 09/07/2022 CLINICAL DATA:  Leukocytosis. EXAM: PORTABLE CHEST 1 VIEW COMPARISON:  Chest x-ray 08/31/2022 FINDINGS: Underinflation. Calcified aorta. Normal cardiopericardial silhouette. Decreasing bilateral lung opacities. Some residual opacity left lung base. Mild right basilar atelectasis as well. Recommend continued follow-up. No pneumothorax or effusion. IMPRESSION: Improving bilateral lung opacities. Some residual at the bases. Recommend continued follow-up Electronically Signed   By: Jill Side M.D.   On: 09/07/2022 18:26   DG CHOLANGIOGRAM  EXISTING TUBE  Result Date: 09/07/2022 INDICATION: History of acute cholecystitis, post ultrasound fluoroscopic guided cholecystostomy tube placement on 08/27/2022. Patient with persistent right upper quadrant pain and leukocytosis. As such, patient presents now for cholangiogram via existing cholecystostomy tube. EXAM: FLUOROSCOPIC GUIDED CHOLECYSTOSTOMY TUBE INJECTION COMPARISON:  Image guided cholecystostomy tube placement-08/27/2022 CT abdomen and pelvis-09/04/2022 Nuclear medicine HIDA scan-08/26/2022 MRCP-08/26/2022 Right upper quadrant abdominal ultrasound-08/26/2022 MEDICATIONS: None ANESTHESIA/SEDATION: None CONTRAST:  40m OMNIPAQUE IOHEXOL 300 MG/ML SOLN - administered into the gallbladder fossa. FLUOROSCOPY TIME:  6 seconds (2.8 mGy) COMPLICATIONS: None immediate. PROCEDURE: The patient was positioned supine on the fluoroscopy table. A preprocedural spot fluoroscopic image was obtained of the right upper abdominal  quadrant existing cholecystostomy tube. Multiple spot fluoroscopic radiographic images were obtained of the right upper abdominal quadrant an existing cholecystostomy tube following injection of a small amount of contrast. Images were reviewed and discussed with the patient. The cholecystostomy tube was flushed with a small amount of saline and reconnected to a  gravity bag. A dressing was placed. The patient tolerated the procedure well without immediate postprocedural complication. FINDINGS: Preprocedural spot fluoroscopic image of the right upper abdominal quadrant demonstrates grossly unchanged positioning of the cholecystostomy tube with end coiled and locked overlying the expected location of the gallbladder fundus. Subsequent contrast injection demonstrates appropriate functionality of the cholecystostomy tube with brisk opacification of the gallbladder. While there are no discrete filling defects within the gallbladder lumen, there is persistent complete occlusion of the mid aspect of the cystic duct without opacification of the CBD. IMPRESSION: 1. Appropriately positioned and functioning cholecystostomy tube. No exchange performed. 2. Persistent occlusion of the cystic duct without opacification of the CBD, presumably secondary to choledocholithiasis. PLAN: - Recommend maintaining cholecystostomy tube to gravity bag. - If interval cholecystectomy is not pursued, recommend repeat cholangiogram and cholecystostomy tube exchange in 6-8 weeks following initial placement. Electronically Signed   By: Sandi Mariscal M.D.   On: 09/07/2022 12:28    Anti-infectives: Anti-infectives (From admission, onward)    Start     Dose/Rate Route Frequency Ordered Stop   09/07/22 2200  cefTRIAXone (ROCEPHIN) 2 g in sodium chloride 0.9 % 100 mL IVPB        2 g 200 mL/hr over 30 Minutes Intravenous Every 24 hours 09/07/22 1614     08/29/22 1400  ceFEPIme (MAXIPIME) 2 g in sodium chloride 0.9 % 100 mL IVPB  Status:  Discontinued        2 g 200 mL/hr over 30 Minutes Intravenous Every 8 hours 08/29/22 0750 09/07/22 1613   08/26/22 1800  ceFEPIme (MAXIPIME) 2 g in sodium chloride 0.9 % 100 mL IVPB  Status:  Discontinued        2 g 200 mL/hr over 30 Minutes Intravenous Every 12 hours 08/26/22 0756 08/29/22 0750   08/26/22 1200  metroNIDAZOLE (FLAGYL) IVPB 500 mg        500 mg 100  mL/hr over 60 Minutes Intravenous Every 12 hours 08/26/22 0136     08/26/22 0600  ceFEPIme (MAXIPIME) 2 g in sodium chloride 0.9 % 100 mL IVPB  Status:  Discontinued        2 g 200 mL/hr over 30 Minutes Intravenous Every 8 hours 08/26/22 0013 08/26/22 0756   08/25/22 2300  ceFEPIme (MAXIPIME) 2 g in sodium chloride 0.9 % 100 mL IVPB       See Hyperspace for full Linked Orders Report.   2 g 200 mL/hr over 30 Minutes Intravenous  Once 08/25/22 2252 08/26/22 0030   08/25/22 2300  metroNIDAZOLE (FLAGYL) IVPB 500 mg       See Hyperspace for full Linked Orders Report.   500 mg 100 mL/hr over 60 Minutes Intravenous  Once 08/25/22 2252 08/26/22 0136        Assessment/Plan  Acute cholecystitis S/p percutaneous cholecystostomy tube placement 08/27/22  ?Choledocholithiasis - MRCP 08/26/2022 showed a distended gallbladder with sludge and diffuse wall thickening with pericholecystic edema without gallstones, mild diffuse intrahepatic biliary ductal dilatation with a dilated CBD duct 13 mm with a possible stone in the lower third of the CBD. LFTs are WNL - GI is following and will defer further work up regarding this to  them. Persistent RUQ pain and leukocytosis -Pain resolved currently  -CT a/p from 12/31 w/ perc chole tube to be in correct position; it also reports a small 4.1 x 1.8 cm right anterolateral fluid collection as well as possible developing fluid collection in the right adnexal region and measuring 1.9 x 1.6 cm. Reviewed with MD. This fluid collection is small and we would not recommend perc drainage at this time. Radiology reviewed and it is not amenable to drainage.  She is on antibiotics for her cholecystitis which will likely help if this is a small abscess. -WBC may be from her lungs; if no improvement would repeat imaging early next week   ID - currently Rocephin and flagyl VTE - eliquis FEN - D1 diet, ensure TID Foley - none  Dispo - acute cholecystitis well controlled with  perc chole drain.  Defer further work up and management of possible CBD stone to GI.  Cont abx therapy for small fluid collection in pelvis.  Doubt WBC related to cholecystitis, given drain placement.  She will need to follow up with Dr. Arnoldo Morale as an outpatient for further recommendations of possible interval lap chole etc.  Management and recommendations for drain care per IR.  No current surgical needs.  We are available if needed.   LOS: 14 days   I reviewed nursing notes, Consultant GI notes, last 24 h vitals and pain scores, last 48 h intake and output, last 24 h labs and trends, and last 24 h imaging results.  This care required moderate level of medical decision making.   [facility] Please see Amion for pager number during day hours 7:00am-4:30pm

## 2022-09-08 NOTE — Progress Notes (Signed)
Patient removed Midline IV.  MD notified

## 2022-09-08 NOTE — Progress Notes (Addendum)
Dear Doctor: This patient has been identified as a candidate for PICC (sutured) for the following reason (s): IV therapy over 48 hours, poor veins/poor circulatory system (CHF, COPD, emphysema, diabetes, steroid use, IV drug abuse, etc.), and restarts due to phlebitis and infiltration in 24 hours If you agree, please write an order for the indicated device.   Thank you for supporting the early vascular access assessment program.

## 2022-09-08 NOTE — Progress Notes (Signed)
Mobility Specialist Progress Note   09/08/22 1453  Mobility  Activity Dangled on edge of bed  Level of Assistance Moderate assist, patient does 50-74%  Assistive Device Other (Comment) (HHA)  Range of Motion/Exercises All extremities  Activity Response Tolerated well  $Mobility charge 1 Mobility   Pre Mobility: 71 HR During Mobility: 77 HR Post Mobility: 81 HR  Received in bed having soiled self and requiring +2A for pericare. Once cleaned, able to get EOB w/ ModA for trunk assistance + heavy cues to follow instructions d/t cognitive delay. Once seated, able to perform x4 exercises w/o fault and decent mobility. Returned back supine w/o assistance, call bell in reach and bed alarm on.   [place] Mobility Specialist Please contact via SecureChat or  Rehab office at [phone]

## 2022-09-09 ENCOUNTER — Inpatient Hospital Stay (HOSPITAL_COMMUNITY): Payer: Medicare Other

## 2022-09-09 DIAGNOSIS — K810 Acute cholecystitis: Secondary | ICD-10-CM | POA: Diagnosis not present

## 2022-09-09 DIAGNOSIS — D72829 Elevated white blood cell count, unspecified: Secondary | ICD-10-CM | POA: Diagnosis not present

## 2022-09-09 DIAGNOSIS — F03918 Unspecified dementia, unspecified severity, with other behavioral disturbance: Secondary | ICD-10-CM | POA: Diagnosis not present

## 2022-09-09 DIAGNOSIS — Z7189 Other specified counseling: Secondary | ICD-10-CM | POA: Diagnosis not present

## 2022-09-09 LAB — CBC WITH DIFFERENTIAL/PLATELET
Abs Immature Granulocytes: 0.25 10*3/uL — ABNORMAL HIGH (ref 0.00–0.07)
Basophils Absolute: 0.1 10*3/uL (ref 0.0–0.1)
Basophils Relative: 1 %
Eosinophils Absolute: 0.5 10*3/uL (ref 0.0–0.5)
Eosinophils Relative: 3 %
HCT: 40.1 % (ref 36.0–46.0)
Hemoglobin: 13 g/dL (ref 12.0–15.0)
Immature Granulocytes: 2 %
Lymphocytes Relative: 18 %
Lymphs Abs: 2.9 10*3/uL (ref 0.7–4.0)
MCH: 29.3 pg (ref 26.0–34.0)
MCHC: 32.4 g/dL (ref 30.0–36.0)
MCV: 90.5 fL (ref 80.0–100.0)
Monocytes Absolute: 1.4 10*3/uL — ABNORMAL HIGH (ref 0.1–1.0)
Monocytes Relative: 9 %
Neutro Abs: 10.3 10*3/uL — ABNORMAL HIGH (ref 1.7–7.7)
Neutrophils Relative %: 67 %
Platelets: 424 10*3/uL — ABNORMAL HIGH (ref 150–400)
RBC: 4.43 MIL/uL (ref 3.87–5.11)
RDW: 15.2 % (ref 11.5–15.5)
WBC: 15.5 10*3/uL — ABNORMAL HIGH (ref 4.0–10.5)
nRBC: 0 % (ref 0.0–0.2)

## 2022-09-09 LAB — COMPREHENSIVE METABOLIC PANEL
ALT: 16 U/L (ref 0–44)
AST: 19 U/L (ref 15–41)
Albumin: 2.8 g/dL — ABNORMAL LOW (ref 3.5–5.0)
Alkaline Phosphatase: 65 U/L (ref 38–126)
Anion gap: 8 (ref 5–15)
BUN: 20 mg/dL (ref 8–23)
CO2: 23 mmol/L (ref 22–32)
Calcium: 9.6 mg/dL (ref 8.9–10.3)
Chloride: 106 mmol/L (ref 98–111)
Creatinine, Ser: 0.85 mg/dL (ref 0.44–1.00)
GFR, Estimated: >60 mL/min (ref >60)
Glucose, Bld: 113 mg/dL — ABNORMAL HIGH (ref 70–99)
Potassium: 3.9 mmol/L (ref 3.5–5.1)
Sodium: 137 mmol/L (ref 135–145)
Total Bilirubin: 0.5 mg/dL (ref 0.3–1.2)
Total Protein: 7.4 g/dL (ref 6.5–8.1)

## 2022-09-09 LAB — GLUCOSE, CAPILLARY
Glucose-Capillary: 166 mg/dL — ABNORMAL HIGH (ref 70–99)
Glucose-Capillary: 117 mg/dL — ABNORMAL HIGH (ref 70–99)
Glucose-Capillary: 129 mg/dL — ABNORMAL HIGH (ref 70–99)

## 2022-09-09 LAB — MAGNESIUM: Magnesium: 2.2 mg/dL (ref 1.7–2.4)

## 2022-09-09 MED ORDER — PIPERACILLIN-TAZOBACTAM 3.375 G IVPB
3.3750 g | Freq: Three times a day (TID) | INTRAVENOUS | Status: DC
  Administered 2022-09-09 – 2022-09-13 (×12): 3.375 g via INTRAVENOUS
  Filled 2022-09-09 (×13): qty 50

## 2022-09-09 MED ORDER — AMOXICILLIN 500 MG PO CAPS
500.0000 mg | ORAL_CAPSULE | Freq: Once | ORAL | Status: AC
  Administered 2022-09-09: 500 mg via ORAL
  Filled 2022-09-09: qty 1

## 2022-09-09 MED ORDER — PIPERACILLIN-TAZOBACTAM 3.375 G IVPB: 3.3750 g | Freq: Three times a day (TID) | INTRAVENOUS | Status: DC

## 2022-09-09 MED ORDER — EPINEPHRINE 0.3 MG/0.3ML IJ SOAJ
0.3000 mg | Freq: Once | INTRAMUSCULAR | Status: DC | PRN
  Filled 2022-09-09: qty 0.6

## 2022-09-09 MED ORDER — DIPHENHYDRAMINE HCL 50 MG/ML IJ SOLN: 25.0000 mg | Freq: Once | INTRAMUSCULAR | Status: DC | PRN

## 2022-09-09 MED ORDER — GADOBUTROL 1 MMOL/ML IV SOLN
7.0000 mL | Freq: Once | INTRAVENOUS | Status: AC | PRN
  Administered 2022-09-09: 7 mL via INTRAVENOUS

## 2022-09-09 MED ORDER — LOPERAMIDE HCL 2 MG PO CAPS
2.0000 mg | ORAL_CAPSULE | Freq: Two times a day (BID) | ORAL | Status: DC | PRN
  Administered 2022-09-09 – 2022-09-16 (×4): 2 mg via ORAL
  Filled 2022-09-09 (×4): qty 1

## 2022-09-09 NOTE — Progress Notes (Signed)
Physical Therapy Treatment Patient Details Name: Kathleen Valenzuela MRN: [id number] DOB: 1950-11-12 Today's Date: 09/09/2022   History of Present Illness Pt is a 72 yo female admitted from SNF [facility]/22 with AMS; workup for acute cholycystitis, sepsis. Worsening respiratory status 12/25 requiring BiPAP; rapid response 12/26 for respiratory status and BiPAP intolerance, transfer to ICU. PMH includes dementia, anemia, anxiety, bipolar 1 disorder, COPD, GERD, heart murmur, HLD, HTN.    PT Comments    Focus of session was OOB to chiar. Offered Stedy to maximize pt comfort and trust with therapy as it was noted that fear of falling has limited past sessions. Pt tolerated treatment well, had a successful BM on the [facility], and was positioned in the recliner at end of session. Pt complained of mild pain in her RUQ however able to ease with more reclined position in the chair. Will continue to follow and progress as able per POC.    Recommendations for follow up therapy are one component of a multi-disciplinary discharge planning process, led by the attending physician.  Recommendations may be updated based on patient status, additional functional criteria and insurance authorization.  Follow Up Recommendations  Skilled nursing-short term rehab (<3 hours/day) Can patient physically be transported by private vehicle: No   Assistance Recommended at Discharge Frequent or constant Supervision/Assistance  Patient can return home with the following A lot of help with walking and/or transfers;A lot of help with bathing/dressing/bathroom   Equipment Recommendations  Rolling walker (2 wheels)    Recommendations for Other Services       Precautions / Restrictions Precautions Precautions: Fall;Other (comment) Precaution Comments: RUQ drain Restrictions Weight Bearing Restrictions: No     Mobility  Bed Mobility Overal bed mobility: Needs Assistance Bed Mobility: Supine to Sit     Supine to sit: Min  assist     General bed mobility comments: Pt performing most of the transfer to EOB. Light assist with bed pad to get feet fully on the floor. Pt with good sitting balance.    Transfers Overall transfer level: Needs assistance Equipment used: 1 person [place] held assist Transfers: Sit to/from Stand, Bed to chair/wheelchair/BSC Sit to Stand: Min guard           General transfer comment: Noted pt with fear of falling in past sessions, limiting success in transferring OOB. Offered Stedy to maximize comfort and trust with transfer to chair. Pt performed several sit<>stands within [place], transferred to [facility] and then recliner. Transfer via Lift Equipment: Stedy  Ambulation/Gait                   Stairs             Wheelchair Mobility    Modified Rankin (Stroke Patients Only)       Balance Overall balance assessment: Needs assistance Sitting-balance support: No upper extremity supported Sitting balance-Leahy Scale: Good     Standing balance support: Single extremity supported, During functional activity Standing balance-Leahy Scale: Poor Standing balance comment: reliant on UE support and external assist                            Cognition Arousal/Alertness: Awake/alert Behavior During Therapy: Flat affect, Impulsive Overall Cognitive Status: History of cognitive impairments - at baseline  Exercises      General Comments        Pertinent Vitals/Pain Pain Assessment Pain Assessment: Faces Faces Pain Scale: Hurts a little bit Pain Location: RUQ Pain Descriptors / Indicators: Grimacing, Guarding    Home Living                          Prior Function            PT Goals (current goals can now be found in the care plan section) Acute Rehab PT Goals Patient Stated Goal: none stated PT Goal Formulation: Patient unable to participate in goal setting Time For Goal  Achievement: 09/11/22 Potential to Achieve Goals: Fair Progress towards PT goals: Progressing toward goals    Frequency    Min 2X/week      PT Plan Current plan remains appropriate    Co-evaluation              AM-PAC PT "6 Clicks" Mobility   Outcome Measure  Help needed turning from your back to your side while in a flat bed without using bedrails?: A Little Help needed moving from lying on your back to sitting on the side of a flat bed without using bedrails?: A Little Help needed moving to and from a bed to a chair (including a wheelchair)?: A Little Help needed standing up from a chair using your arms (e.g., wheelchair or bedside chair)?: A Little Help needed to walk in hospital room?: A Little Help needed climbing 3-5 steps with a railing? : A Lot 6 Click Score: 17    End of Session Equipment Utilized During Treatment: Gait belt;Oxygen Activity Tolerance: Patient limited by fatigue Patient left: in bed;with call bell/phone within reach;with bed alarm set Nurse Communication: Mobility status PT Visit Diagnosis: Unsteadiness on feet (R26.81);Muscle weakness (generalized) (M62.81)     Time: 7591-6384 PT Time Calculation (min) (ACUTE ONLY): 28 min  Charges:  $Gait Training: 23-37 mins                     Rolinda Roan, PT, DPT Acute Rehabilitation Services Secure Chat Preferred Office: [phone]    [name] 09/09/2022, 3:09 PM

## 2022-09-09 NOTE — Progress Notes (Signed)
Progress Note  Primary GI: Unassigned, previously Dr. Laural Golden   Subjective  Chief Complaint: Cholecystitis, suspected choledocholithiasis with normal LFTs.  Patient with continuing confusion.  No family at bedside.    Objective   Vital signs in last 24 hours: Temp:  [97.6 F (36.4 C)-98.7 F (37.1 C)] 98.4 F (36.9 C) (01/05 1427) Pulse Rate:  [77-97] 78 (01/05 1427) Resp:  [17-21] 17 (01/05 1427) BP: (112-137)/(53-99) 122/70 (01/05 1427) SpO2:  [94 %-97 %] 97 % (01/05 1427) Last BM Date : 09/08/22 Last BM recorded by nurses in past 5 days Stool Type: Type 7 (Liquid consistency with no solid pieces) (09/07/2022  5:10 AM)  General:   female anxious, chronically ill-appearing Heart:  Regular rate and rhythm; Pulm: Clear anteriorly; no wheezing Abdomen:  Soft, Obese AB, Active bowel sounds. mild tenderness in the RUQ at drain site.  Scant serosanguineous liquid in drain. Extremities:  without  edema. Neurologic: Patient A&O x 1, no focal neurodeficits Psych: Anxious, tearful  Intake/Output from previous day: 01/04 0701 - 01/05 0700 In: 500 [IV Piggyback:500] Out: -  Intake/Output this shift: Total I/O In: -  Out: 66 [Drains:70]  Studies/Results: [place] EKG SITE RITE  Result Date: 09/08/2022 If Site Rite image not attached, placement could not be confirmed due to current cardiac rhythm.  DG CHEST PORT 1 VIEW  Result Date: 09/07/2022 CLINICAL DATA:  Leukocytosis. EXAM: PORTABLE CHEST 1 VIEW COMPARISON:  Chest x-ray 08/31/2022 FINDINGS: Underinflation. Calcified aorta. Normal cardiopericardial silhouette. Decreasing bilateral lung opacities. Some residual opacity left lung base. Mild right basilar atelectasis as well. Recommend continued follow-up. No pneumothorax or effusion. IMPRESSION: Improving bilateral lung opacities. Some residual at the bases. Recommend continued follow-up Electronically Signed   By: Jill Side M.D.   On: 09/07/2022 18:26    Lab Results: Recent  Labs    09/07/22 0306 09/08/22 0307 09/09/22 0527  WBC 19.5* 19.1* 15.5*  HGB 11.4* 12.1 13.0  HCT 36.6 38.6 40.1  PLT 455* 472* 424*   BMET Recent Labs    09/07/22 0306 09/08/22 0307 09/09/22 0527  NA 137 137 137  K 3.9 3.8 3.9  CL 105 106 106  CO2 '23 22 23  '$ GLUCOSE 116* 127* 113*  BUN '11 18 20  '$ CREATININE 0.78 0.86 0.85  CALCIUM 9.2 9.4 9.6   LFT Recent Labs    09/09/22 0527  PROT 7.4  ALBUMIN 2.8*  AST 19  ALT 16  ALKPHOS 65  BILITOT 0.5   PT/INR No results for input(s): "LABPROT", "INR" in the last 72 hours.   Scheduled Meds:  apixaban  5 mg Oral BID   atorvastatin  40 mg Oral QHS   benztropine  0.5 mg Oral BID   buPROPion  150 mg Oral q morning   Chlorhexidine Gluconate Cloth  6 each Topical Daily   donepezil  10 mg Oral Q2000   feeding supplement  237 mL Oral TID WC   fluticasone  1 spray Each Nare Daily   fluticasone furoate-vilanterol  1 puff Inhalation Daily   gabapentin  300 mg Oral TID   isosorbide mononitrate  15 mg Oral Daily   lurasidone  80 mg Oral QHS   memantine  10 mg Oral BID   metoprolol succinate  25 mg Oral Daily   multivitamin with minerals  1 tablet Oral Daily   pantoprazole  40 mg Oral BID AC   sodium chloride flush  10-40 mL Intracatheter Q12H   sodium chloride flush  5  mL Intracatheter Q8H   tiZANidine  2 mg Oral BID   traZODone  100 mg Oral QHS   venlafaxine XR  300 mg Oral Daily   Continuous Infusions:  piperacillin-tazobactam (ZOSYN)  IV 3.375 g (09/09/22 1345)      Patient profile:   72 year old female with history of HFpE, A-fib on Eliquis, COPD, dementia, GERD, gastric ulcers, and duodenal stenosis presented with AMS, found to have acute cholecystitis s/p PCT on 08/27/22.    Impression/Plan:   Acute cholecystitis Status post PCT 08/27/2022 with IR Continues on cefepime and Flagyl Filling defects on ultrasound and MRCP, cholangiogram revealed appropriately positioned tube functioning persistent occlusion of  cystic duct without opacification of CBD. Patient has no elevated LFTs, continues to be normal. White blood cell count 19.1 Discussed case with Dr. Rush Landmark physician no evidence of any filling defect of CBD could be choledocholithiasis versus 6 duct stone because of cholecystitis. Discussed Eliquis washout with EUS ERCP in the hospital for confirmation versus in setting of her normal LFTs having the patient repeat MRCP in 4 to 6 weeks and then proceeding with ERCP if still CBD stone concern. There is no family at bedside, per Marya Amsler, the Education officer, museum, he has been discussing care with her son son Boneta Standre.   Had a prolonged phone conversation with the son Enedelia Martorelli, he will plan on coming to see the patient tomorrow around noon or afternoon, will try to set up a time to meet with the patient's son.  Would also suggest potentially having social worker involved, patient's son had several questions.  Able to talk with Lavella Lemons as well, if time today will call her.   Right adnexal and right anterior lateral abscesses Sure does not feel percutaneous drainage necessary at this time  A-fib Last dose of Eliquis this morning, currently not on hold.  Leukocytosis Chest x-ray continuing improving bilateral lower lung opacities Blood culture negative day 5  Principal Problem:   Acute cholecystitis Active Problems:   Bipolar disorder (HCC)   High cholesterol   GERD (gastroesophageal reflux disease)   COPD (chronic obstructive pulmonary disease) (HCC)   Essential hypertension   Diastolic dysfunction with heart failure (HCC)   Atrial fibrillation, chronic ([place])   Dementia with behavioral disturbance ([place])   Acute metabolic encephalopathy   Sepsis ([place])    LOS: 15 days   Vladimir Crofts  09/09/2022, 3:05 PM

## 2022-09-09 NOTE — Progress Notes (Signed)
Nutrition Follow-up  DOCUMENTATION CODES:   Not applicable  INTERVENTION:  Feeding assistance with all meals Ensure Enlive po TID, each supplement provides 350 kcal and 20 grams of protein. Magic cup TID with meals, each supplement provides 290 kcal and 9 grams of protein MVI with minerals daily Recommend Cortrak placement for supplemental nutrition as aligns with GOC; discussed with MD  NUTRITION DIAGNOSIS:   Moderate Malnutrition related to chronic illness (COPD) as evidenced by mild fat depletion, mild muscle depletion, severe muscle depletion.  ongoing  GOAL:   Patient will meet greater than or equal to 90% of their needs  Goal unmet, addressing via meals and supplements  MONITOR:   PO intake, Supplement acceptance, Labs, Weight trends, Skin, I & O's  REASON FOR ASSESSMENT:   NPO/Clear Liquid Diet    ASSESSMENT:   72 y/o female with h/o bipolar disorder, dementia, GERD, COPD, HTN, CHF, AFib, duodenal stricture, esophageal dysphagia s/p dilation, PUD, diverticulitis with abscess and anxiety who is admitted with sepsis secondary to acute cholecytitis s/p IR cholecystostomy tube 12/23.  Pt has been on dysphagia 1/thin liquid diet since 12/29. NPO today for MRCP to assess for abscesses or other changes causing leukocytosis.   Reviewed documented meal completions. Pt noted to consume an average of 14.3% x 7 recorded meals (12/30-1/2). On 1/3 there is 1 documented meal completions 50% for breakfast. Spoke with RN who reports that she has eaten minimally and has not received an Ensure today d/t NPO status.  Pt has continued with inadequate PO intake throughout admission as she was noted to be NPO/clear liquid for >7 day on 12/29 by prior RD documentation. Discussed alternate nutrition access for supplemental nutrition with MD, holding off at this time as pt has removed PIV x7 and suspect she is highly likely to self remove Cortrak.   Pt's weight has remained fairly stable  throughout admission. Admit weight: 81.6 kg Current weight: 78.6 kg   Medications: MVI, protonix, IV abx  Labs: CBG's 116, 166 x24 hours  Diet Order:   Diet Order             Diet NPO time specified  Diet effective now                   EDUCATION NEEDS:   Education needs have been addressed  Skin:  Skin Assessment: Reviewed RN Assessment (ecchymosis, incision abdomen (s/p drain))  Last BM:  1/4  Height:   Ht Readings from Last 1 Encounters:  08/25/22 '5\' 7"'$  (1.702 m)    Weight:   Wt Readings from Last 1 Encounters:  09/08/22 78.6 kg    Ideal Body Weight:  61.36 kg  BMI:  Body mass index is 27.14 kg/m.  Estimated Nutritional Needs:   Kcal:  1700-1900kcal/day  Protein:  85-95g/day  Fluid:  1.7-1.9L/day  Clayborne Dana, RDN, LDN Clinical Nutrition

## 2022-09-09 NOTE — Progress Notes (Signed)
Progress Note    Kathleen Valenzuela   GEZ:[id number]  DOB: 10-28-1950  DOA: 08/25/2022     15 PCP: Monico Blitz, MD  Initial CC: AMS  Hospital Course: Kathleen Valenzuela is a 72 yo female with PMH chronic anemia, anxiety, bipolar 1 disorder, COPD, GERD, heart murmur, HLD, HTN who presented with AMS.  She presented from her SNF to the hospital. She underwent workup with CT, ultrasound, and MRCP.  Findings were consistent with a distended sludge-filled gallbladder with diffuse wall thickening and pericholecystic edema.  She underwent evaluation with GI and general surgery.  She underwent percutaneous tube placement on 08/27/2022. Repeat imaging was performed on 09/04/2022 and showed interval development of a right anterolateral abscess formation and second abscess formation in the right adnexal region.  Interval History:  No events overnight.  Peripheral IV placed in right arm.  Patient remains at baseline.  Still has some pain and intake is still minimal. Called and had long discussion with her son, Kathleen Valenzuela and the family friend, Kathleen Valenzuela. Kathleen Valenzuela and Kathleen Valenzuela are main decision makers and Kathleen Valenzuela also was okay being contacted to become HCPOA.  I explained that she is approaching a possible plateau with possibly not improving and now nutrition is becoming a factor.  The hope is that she does not fact improve but if not, they would be open for further considerations of possibly transitioning to hospice rather than pursuing aggressive measures such as PEG placement.  They would want to try cortrak placement first but understand she might pull it out too.  We discussed that we would wait and see what repeat imaging shows and then have further goals of care discussions at that time.  Assessment and Plan:  Sepsis due to acute cholecystitis - Had extensive workup with CT abdomen/pelvis, HIDA scan and MRCP.  Seen by [facility]/ IR and [facility] - cholecystostomy - performed on 08/27/2022 - S/P ultrasound as well  as CT abdomen 12/31 " Interval development of a 4.1 x 1.8 cm right anterolateral abscess formation. Likely second abscess forming measuring 1.9 x 1.6 cm in the region of the right adnexa. Cholecystostomy tube in good position with decompressed gallbladder lumen."  - blood cultures remained negative; Ucx insignificant growth but treated likely by default - has been on cefepime and flagyl since 12/21 and continues to have persistent leukocytosis - given ongoing "confusion" and persistent leukocytosis, I would like to try alternative abx to avoid confounding of encephalopathy from cefepime and see if it helps WBC possibly too -Transition to Zosyn on 09/09/2022 (d/c rocephin/flagyl).  WBC did show some improvement prior to Zosyn transition -Given persistent leukocytosis, repeat MRCP at this time -factors now affecting prognosis are also nutrition; has not had adequate intake in several days which will start to play a role.  Also depending on MRCP results, family may also still wish for possibly being less aggressive if recovery appears unobtainable  Leukocytosis - see above as well - still feel like abdomen is source. CXR repeated 1/3 shows improvement from prior even - given persistent leukocytosis still, will obtain repeat MRCP at this time to re-evaluate abscesses or any other changes  GOC discussions - spoke with Kathleen Valenzuela (son) and Kathleen Valenzuela (friend) on 09/09/22; explained in detail that she is not a good surgical candidate and may be having ongoing complications from cholecystitis that antibiotics may not be able to treat through but time will tell.  Also concern for her nutritional status as she has not been eating much  for several days now and in setting of underlying dementia, she may further decline without adequate nutrition.  Options would be for trial of cortrak placement and starting tube feeds versus PICC placement and starting TPN vs transition to hospice/comfort. I did not recommend PEG placement in  setting of her dementia and no benefit to overall QOL in this clinical context. They are open to all options and if prognosis did worsen they understand that hospice/comfort would be reasonable as well - first step will be repeat imaging and see if any improvement or worsening, then deciding if pursuing cortrak is appropriate (weekend may complicate matter too) - Kathleen Valenzuela and Kathleen Valenzuela plan to discuss code status as well as making Arcelia Jew (chaplain consult placed) - further discussions to be had as course progresses  Acute metabolic encephalopathy -Suspected multifactorial from sepsis and underlying dementia.  Also has been on prolonged cefepime; see reasoning above for antibiotic transition  Dementia with behavioral disturbances Bipolar disorder -Oriented to name and president only -Continue Namenda and Aricept - Continue Cogentin, Wellbutrin, trazodone, Effexor  Acute hypoxic respiratory failure -resolved - on RA Acute on chronic diastolic CHF - resolved -Now euvolemic Normocytic anemia - Baseline hemoglobin around 12 to 13 g/dL Thrombocytopenia resolved. Chronic atrial fibrillation continue Eliquis and metoprolol Hypokalemia - replete as needed GERD:Continue PPI Dysphagia: speech input appreciated diet changed to dysphagia 1   Old records reviewed in assessment of this patient  Antimicrobials: Cefepime 08/26/2022 >> 09/07/22 Flagyl 08/26/2022 >> 09/09/22 Rocephin 09/07/21 >> 09/09/22 Zosyn 09/09/22 >> current   DVT prophylaxis:  SCDs Start: 08/26/22 0137 apixaban (ELIQUIS) tablet 5 mg   Code Status:   Code Status: Full Code  Mobility Assessment (last 72 hours)     Mobility Assessment     Row Name 09/09/22 1457 09/08/22 1941 09/08/22 0900 09/07/22 1955 09/07/22 1500   Does patient have an order for bedrest or is patient medically unstable -- No - Continue assessment No - Continue assessment No - Continue assessment --   What is the highest level of mobility based on the progressive  mobility assessment? Level 3 (Stands with assist) - Balance while standing  and cannot march in place Level 3 (Stands with assist) - Balance while standing  and cannot march in place Level 3 (Stands with assist) - Balance while standing  and cannot march in place Level 2 (Chairfast) - Balance while sitting on edge of bed and cannot stand Level 2 (Chairfast) - Balance while sitting on edge of bed and cannot stand   Is the above level different from baseline mobility prior to current illness? -- Yes - Recommend PT order Yes - Recommend PT order Yes - Recommend PT order --    Row Name 09/07/22 0830 09/06/22 1930         Does patient have an order for bedrest or is patient medically unstable No - Continue assessment No - Continue assessment      What is the highest level of mobility based on the progressive mobility assessment? Level 3 (Stands with assist) - Balance while standing  and cannot march in place Level 3 (Stands with assist) - Balance while standing  and cannot march in place      Is the above level different from baseline mobility prior to current illness? Yes - Recommend PT order Yes - Recommend PT order               Barriers to discharge:  Disposition Plan:  Pending clinical course  Status is: Inpt  Objective: Blood pressure 122/70, pulse 78, temperature 98.4 F (36.9 C), temperature source Oral, resp. rate 17, height '5\' 7"'$  (1.702 m), weight 78.6 kg, SpO2 97 %.  Examination:  Physical Exam Constitutional:      Comments: Pleasant and demented elderly woman resting in bed in no distress  HENT:     Head: Normocephalic and atraumatic.  Eyes:     Extraocular Movements: Extraocular movements intact.  Cardiovascular:     Rate and Rhythm: Normal rate and regular rhythm.  Pulmonary:     Effort: No respiratory distress.     Breath sounds: Normal breath sounds. No wheezing.  Abdominal:     General: Bowel sounds are normal. There is no distension.     Palpations: Abdomen is  soft.     Tenderness: There is no abdominal tenderness.     Comments: Perc drain in place in RUQ  Musculoskeletal:        General: Normal range of motion.     Cervical back: Normal range of motion and neck supple.  Skin:    General: Skin is warm and dry.  Neurological:     Comments: Oriented only to name and president.  Moves all 4 extremities and follows commands      Consultants:  GI General surgery  Procedures:  08/27/22: PCT to RUQ  Data Reviewed: Results for orders placed or performed during the hospital encounter of 08/25/22 (from the past 24 hour(s))  Comprehensive metabolic panel     Status: Abnormal   Collection Time: 09/09/22  5:27 AM  Result Value Ref Range   Sodium 137 135 - 145 mmol/L   Potassium 3.9 3.5 - 5.1 mmol/L   Chloride 106 98 - 111 mmol/L   CO2 23 22 - 32 mmol/L   Glucose, Bld 113 (H) 70 - 99 mg/dL   BUN 20 8 - 23 mg/dL   Creatinine, Ser 0.85 0.44 - 1.00 mg/dL   Calcium 9.6 8.9 - 10.3 mg/dL   Total Protein 7.4 6.5 - 8.1 g/dL   Albumin 2.8 (L) 3.5 - 5.0 g/dL   AST 19 15 - 41 U/L   ALT 16 0 - 44 U/L   Alkaline Phosphatase 65 38 - 126 U/L   Total Bilirubin 0.5 0.3 - 1.2 mg/dL   GFR, Estimated >60 >60 mL/min   Anion gap 8 5 - 15  CBC with Differential/Platelet     Status: Abnormal   Collection Time: 09/09/22  5:27 AM  Result Value Ref Range   WBC 15.5 (H) 4.0 - 10.5 K/uL   RBC 4.43 3.87 - 5.11 MIL/uL   Hemoglobin 13.0 12.0 - 15.0 g/dL   HCT 40.1 36.0 - 46.0 %   MCV 90.5 80.0 - 100.0 fL   MCH 29.3 26.0 - 34.0 pg   MCHC 32.4 30.0 - 36.0 g/dL   RDW 15.2 11.5 - 15.5 %   Platelets 424 (H) 150 - 400 K/uL   nRBC 0.0 0.0 - 0.2 %   Neutrophils Relative % 67 %   Neutro Abs 10.3 (H) 1.7 - 7.7 K/uL   Lymphocytes Relative 18 %   Lymphs Abs 2.9 0.7 - 4.0 K/uL   Monocytes Relative 9 %   Monocytes Absolute 1.4 (H) 0.1 - 1.0 K/uL   Eosinophils Relative 3 %   Eosinophils Absolute 0.5 0.0 - 0.5 K/uL   Basophils Relative 1 %   Basophils Absolute 0.1 0.0 -  0.1 K/uL   Immature Granulocytes 2 %  Abs Immature Granulocytes 0.25 (H) 0.00 - 0.07 K/uL  Magnesium     Status: None   Collection Time: 09/09/22  5:27 AM  Result Value Ref Range   Magnesium 2.2 1.7 - 2.4 mg/dL  Glucose, capillary     Status: Abnormal   Collection Time: 09/09/22 11:20 AM  Result Value Ref Range   Glucose-Capillary 166 (H) 70 - 99 mg/dL    I have Reviewed nursing notes, Vitals, and Lab results since pt's last encounter. Pertinent lab results : see above I have ordered labwork to follow up on.  I have reviewed the last note from staff over past 24 hours I have discussed pt's care plan and test results with nursing staff, CM/SW, and other staff as appropriate  Time spent: Greater than 50% of the 55 minute visit was spent in counseling/coordination of care for the patient as laid out in the A&P.   LOS: 15 days   Dwyane Dee, MD [facility] 09/09/2022, 3:08 PM

## 2022-09-09 NOTE — Progress Notes (Signed)
Mobility Specialist Progress Note   09/09/22 1435  Mobility  Activity Ambulated with assistance in room  Level of Assistance Contact guard assist, steadying assist  Assistive Device Front wheel walker  Distance Ambulated (ft) 18 ft  Activity Response Tolerated well  $Mobility charge 1 Mobility   Pre Mobility:71 HR, 106/61 BP, 99% SpO2 on RA  During Mobility: 87 HR Post Mobility: 74 HR, 122/70 BP, 100% SpO2  Received in chair having slight c/o pain in RUQ but agreeable. Able to stand w/ CGA for safety + repeated VC for sequencing d/t cognitive delay. Ambulation in room cut short d/t fatigue and pt buckling, able to get back to bed w/o fault. Call bell placed in reach.   Holland Falling Mobility Specialist Please contact via SecureChat or  Rehab office at [phone]

## 2022-09-09 NOTE — Progress Notes (Signed)
Penicillin Allergy Clarification: Oral Amoxicillin Challenge  History of allergy:  Low Risk - rash >10 years ago, no hospitalization or immediate intervention needed per allergy documentation (pt is a poor historian)  Type of intervention:  Amoxicillin Oral Challenge - given this AM and tolerated without issues per discussion with RN  Impact on therapy:  Penicillin allergy removed  Plan: - De-label PCN allergy - Transition to Zosyn 3.375g IV every 8 hours per discussion with MD - Will continue to monitor tolerance on IV therapy  Thank you for allowing pharmacy to be a part of this patient's care.  Alycia Rossetti, PharmD, BCPS Infectious Diseases Clinical Pharmacist 09/09/2022 11:27 AM   **Pharmacist phone directory can now be found on [email or web address] ([email or web address]).  Listed under [facility].

## 2022-09-09 NOTE — Progress Notes (Signed)
    09/09/22 1340  Spiritual Encounters  Type of Visit Initial  Care provided to: Patient  Referral source Nurse (RN/NT/LPN)  Reason for visit Advance directives  OnCall Visit No   Chaplain responded to a spiritual consult for advanced directive education. The patient Kathleen Valenzuela, was happy to visit but said she was not up to going over paperwork so she just talked about why she was in the hospital. I left the paperwork with her and will check back to see if she has questions.  Danice Goltz [facility]  [phone]

## 2022-09-10 DIAGNOSIS — K8050 Calculus of bile duct without cholangitis or cholecystitis without obstruction: Secondary | ICD-10-CM

## 2022-09-10 DIAGNOSIS — F03918 Unspecified dementia, unspecified severity, with other behavioral disturbance: Secondary | ICD-10-CM | POA: Diagnosis not present

## 2022-09-10 DIAGNOSIS — K810 Acute cholecystitis: Secondary | ICD-10-CM | POA: Diagnosis not present

## 2022-09-10 DIAGNOSIS — A419 Sepsis, unspecified organism: Secondary | ICD-10-CM | POA: Diagnosis not present

## 2022-09-10 DIAGNOSIS — Z7189 Other specified counseling: Secondary | ICD-10-CM | POA: Diagnosis not present

## 2022-09-10 LAB — BASIC METABOLIC PANEL
Anion gap: 8 (ref 5–15)
BUN: 18 mg/dL (ref 8–23)
CO2: 23 mmol/L (ref 22–32)
Calcium: 9.2 mg/dL (ref 8.9–10.3)
Chloride: 104 mmol/L (ref 98–111)
Creatinine, Ser: 0.89 mg/dL (ref 0.44–1.00)
GFR, Estimated: >60 mL/min (ref >60)
Glucose, Bld: 112 mg/dL — ABNORMAL HIGH (ref 70–99)
Potassium: 4.3 mmol/L (ref 3.5–5.1)
Sodium: 135 mmol/L (ref 135–145)

## 2022-09-10 LAB — CBC WITH DIFFERENTIAL/PLATELET
Abs Immature Granulocytes: 0.24 10*3/uL — ABNORMAL HIGH (ref 0.00–0.07)
Basophils Absolute: 0.2 10*3/uL — ABNORMAL HIGH (ref 0.0–0.1)
Basophils Relative: 1 %
Eosinophils Absolute: 0.5 10*3/uL (ref 0.0–0.5)
Eosinophils Relative: 3 %
HCT: 39.4 % (ref 36.0–46.0)
Hemoglobin: 13.2 g/dL (ref 12.0–15.0)
Immature Granulocytes: 2 %
Lymphocytes Relative: 25 %
Lymphs Abs: 3.4 10*3/uL (ref 0.7–4.0)
MCH: 30.1 pg (ref 26.0–34.0)
MCHC: 33.5 g/dL (ref 30.0–36.0)
MCV: 90 fL (ref 80.0–100.0)
Monocytes Absolute: 1.5 10*3/uL — ABNORMAL HIGH (ref 0.1–1.0)
Monocytes Relative: 10 %
Neutro Abs: 8.2 10*3/uL — ABNORMAL HIGH (ref 1.7–7.7)
Neutrophils Relative %: 59 %
Platelets: 383 10*3/uL (ref 150–400)
RBC: 4.38 MIL/uL (ref 3.87–5.11)
RDW: 15.3 % (ref 11.5–15.5)
WBC: 14 10*3/uL — ABNORMAL HIGH (ref 4.0–10.5)
nRBC: 0 % (ref 0.0–0.2)

## 2022-09-10 LAB — GLUCOSE, CAPILLARY
Glucose-Capillary: 130 mg/dL — ABNORMAL HIGH (ref 70–99)
Glucose-Capillary: 132 mg/dL — ABNORMAL HIGH (ref 70–99)
Glucose-Capillary: 132 mg/dL — ABNORMAL HIGH (ref 70–99)
Glucose-Capillary: 135 mg/dL — ABNORMAL HIGH (ref 70–99)

## 2022-09-10 LAB — HEPATIC FUNCTION PANEL
ALT: 17 U/L (ref 0–44)
AST: 24 U/L (ref 15–41)
Albumin: 2.9 g/dL — ABNORMAL LOW (ref 3.5–5.0)
Alkaline Phosphatase: 71 U/L (ref 38–126)
Bilirubin, Direct: <0.1 mg/dL (ref 0.0–0.2)
Total Bilirubin: 0.5 mg/dL (ref 0.3–1.2)
Total Protein: 7.6 g/dL (ref 6.5–8.1)

## 2022-09-10 LAB — MAGNESIUM: Magnesium: 2.1 mg/dL (ref 1.7–2.4)

## 2022-09-10 NOTE — Progress Notes (Signed)
Progress Note    Kathleen Valenzuela   WSF:[id number]  DOB: [birth year]  DOA: 08/25/2022     16 PCP: Monico Blitz, MD  Initial CC: AMS  Hospital Course: Kathleen Valenzuela is a 72 yo female with PMH chronic anemia, anxiety, bipolar 1 disorder, COPD, GERD, heart murmur, HLD, HTN who presented with AMS.  She presented from her SNF to the hospital. She underwent workup with CT, ultrasound, and MRCP.  Findings were consistent with a distended sludge-filled gallbladder with diffuse wall thickening and pericholecystic edema.  She underwent evaluation with GI and general surgery.  She underwent percutaneous tube placement on 08/27/2022. Repeat imaging was performed on 09/04/2022 and showed interval development of a right anterolateral abscess formation and second abscess formation in the right adnexal region.  Interval History:  No events overnight.  Sitting up in recliner bedside when seen this morning.  Still having ongoing confusion but is baseline.  Tentative plan for ERCP tomorrow. Son is aware.   Assessment and Plan:  Sepsis due to acute cholecystitis - Had extensive workup with CT abdomen/pelvis, HIDA scan and MRCP.  Seen by [facility]/ IR and transferred [facility] - cholecystostomy - performed on 08/27/2022 - S/P ultrasound as well as CT abdomen 12/31 " Interval development of a 4.1 x 1.8 cm right anterolateral abscess formation. Likely second abscess forming measuring 1.9 x 1.6 cm in the region of the right adnexa. Cholecystostomy tube in good position with decompressed gallbladder lumen."  - blood cultures remained negative; Ucx insignificant growth but treated likely by default - has been on cefepime and flagyl since 12/21 and continues to have persistent leukocytosis - given ongoing "confusion" and persistent leukocytosis, I would like to try alternative abx to avoid confounding of encephalopathy from cefepime and see if it helps WBC possibly too -Transition to Zosyn on 09/09/2022 (d/c rocephin/flagyl).   WBC did show some improvement prior to Zosyn transition - repeat MRCP on 09/09/22 shows CBD stone; GI planning for ERCP on 09/11/22  Leukocytosis - see above as well - still feel like abdomen is source. CXR repeated 1/3 shows improvement from prior even - starting to downtrend  [place] discussions - spoke with [name] (son) and [name] (friend) on 09/09/22; explained in detail that she is not a good surgical candidate and may be having ongoing complications from cholecystitis that antibiotics may not be able to treat through but time will tell.  Also concern for her nutritional status as she has not been eating much for several days now and in setting of underlying dementia, she may further decline without adequate nutrition.  Options would be for trial of cortrak placement and starting tube feeds versus PICC placement and starting TPN vs transition to hospice/comfort. I did not recommend PEG placement in setting of her dementia and no benefit to overall QOL in this clinical context. They are open to all options and if prognosis did worsen they understand that hospice/comfort would be reasonable as well - going to see if diet can be advanced via repeat SLP eval; if not, then might try cortrak next - plan discussed with [name] and [name]   Acute metabolic encephalopathy -Suspected multifactorial from sepsis and underlying dementia.  Also has been on prolonged cefepime; see reasoning above for antibiotic transition  Dementia with behavioral disturbances Bipolar disorder -Oriented to name and president only -Continue Namenda and Aricept - Continue Cogentin, Wellbutrin, trazodone, Effexor  Acute hypoxic respiratory failure -resolved - on RA Acute on chronic diastolic CHF - resolved -  Now euvolemic Normocytic anemia - Baseline hemoglobin around 12 to 13 g/dL Thrombocytopenia resolved. Chronic atrial fibrillation continue Eliquis and metoprolol Hypokalemia - replete as needed GERD:Continue PPI Dysphagia:  speech input appreciated diet changed to dysphagia 1   Old records reviewed in assessment of this patient  Antimicrobials: Cefepime 08/26/2022 >> 09/07/22 Flagyl 08/26/2022 >> 09/09/22 Rocephin 09/07/21 >> 09/09/22 Zosyn 09/09/22 >> current   DVT prophylaxis:  SCDs Start: 08/26/22 0137   Code Status:   Code Status: Full Code  Mobility Assessment (last 72 hours)     Mobility Assessment     Row Name 09/09/22 1931 09/09/22 1457 09/08/22 1941 09/08/22 0900 09/07/22 1955   Does patient have an order for bedrest or is patient medically unstable -- -- No - Continue assessment No - Continue assessment No - Continue assessment   What is the highest level of mobility based on the progressive mobility assessment? Level 3 (Stands with assist) - Balance while standing  and cannot march in place Level 3 (Stands with assist) - Balance while standing  and cannot march in place Level 3 (Stands with assist) - Balance while standing  and cannot march in place Level 3 (Stands with assist) - Balance while standing  and cannot march in place Level 2 (Chairfast) - Balance while sitting on edge of bed and cannot stand   Is the above level different from baseline mobility prior to current illness? -- -- Yes - Recommend PT order Yes - Recommend PT order Yes - Recommend PT order    [place] Name 09/07/22 1500           What is the highest level of mobility based on the progressive mobility assessment? Level 2 (Chairfast) - Balance while sitting on edge of bed and cannot stand                Barriers to discharge:  Disposition Plan:  Pending clinical course  Status is: Inpt  Objective: Blood pressure 96/62, pulse 81, temperature 97.8 F (36.6 C), temperature source Axillary, resp. rate 17, height '5\' 7"'$  (1.702 m), weight 78.6 kg, SpO2 96 %.  Examination:  Physical Exam Constitutional:      Comments: Pleasant and demented elderly woman resting in bed in no distress  HENT:     Head: Normocephalic and atraumatic.   Eyes:     Extraocular Movements: Extraocular movements intact.  Cardiovascular:     Rate and Rhythm: Normal rate and regular rhythm.  Pulmonary:     Effort: No respiratory distress.     Breath sounds: Normal breath sounds. No wheezing.  Abdominal:     General: Bowel sounds are normal. There is no distension.     Palpations: Abdomen is soft.     Tenderness: There is no abdominal tenderness.     Comments: Perc drain in place in RUQ  Musculoskeletal:        General: Normal range of motion.     Cervical back: Normal range of motion and neck supple.  Skin:    General: Skin is warm and dry.  Neurological:     Comments: Oriented only to name and president.  Moves all 4 extremities and follows commands      Consultants:  GI General surgery  Procedures:  08/27/22: PCT to RUQ  Data Reviewed: Results for orders placed or performed during the hospital encounter of 08/25/22 (from the past 24 hour(s))  Glucose, capillary     Status: Abnormal   Collection Time: 09/09/22  4:30 PM  Result Value Ref Range   Glucose-Capillary 117 (H) 70 - 99 mg/dL  Glucose, capillary     Status: Abnormal   Collection Time: 09/09/22  8:14 PM  Result Value Ref Range   Glucose-Capillary 129 (H) 70 - 99 mg/dL  Hepatic function panel     Status: Abnormal   Collection Time: 09/10/22  3:50 AM  Result Value Ref Range   Total Protein 7.6 6.5 - 8.1 g/dL   Albumin 2.9 (L) 3.5 - 5.0 g/dL   AST 24 15 - 41 U/L   ALT 17 0 - 44 U/L   Alkaline Phosphatase 71 38 - 126 U/L   Total Bilirubin 0.5 0.3 - 1.2 mg/dL   Bilirubin, Direct <0.1 0.0 - 0.2 mg/dL   Indirect Bilirubin NOT CALCULATED 0.3 - 0.9 mg/dL  CBC with Differential/Platelet     Status: Abnormal   Collection Time: 09/10/22  3:59 AM  Result Value Ref Range   WBC 14.0 (H) 4.0 - 10.5 K/uL   RBC 4.38 3.87 - 5.11 MIL/uL   Hemoglobin 13.2 12.0 - 15.0 g/dL   HCT 39.4 36.0 - 46.0 %   MCV 90.0 80.0 - 100.0 fL   MCH 30.1 26.0 - 34.0 pg   MCHC 33.5 30.0 - 36.0  g/dL   RDW 15.3 11.5 - 15.5 %   Platelets 383 150 - 400 K/uL   nRBC 0.0 0.0 - 0.2 %   Neutrophils Relative % 59 %   Neutro Abs 8.2 (H) 1.7 - 7.7 K/uL   Lymphocytes Relative 25 %   Lymphs Abs 3.4 0.7 - 4.0 K/uL   Monocytes Relative 10 %   Monocytes Absolute 1.5 (H) 0.1 - 1.0 K/uL   Eosinophils Relative 3 %   Eosinophils Absolute 0.5 0.0 - 0.5 K/uL   Basophils Relative 1 %   Basophils Absolute 0.2 (H) 0.0 - 0.1 K/uL   Immature Granulocytes 2 %   Abs Immature Granulocytes 0.24 (H) 0.00 - 0.07 K/uL  Magnesium     Status: None   Collection Time: 09/10/22  3:59 AM  Result Value Ref Range   Magnesium 2.1 1.7 - 2.4 mg/dL  Basic metabolic panel     Status: Abnormal   Collection Time: 09/10/22  3:59 AM  Result Value Ref Range   Sodium 135 135 - 145 mmol/L   Potassium 4.3 3.5 - 5.1 mmol/L   Chloride 104 98 - 111 mmol/L   CO2 23 22 - 32 mmol/L   Glucose, Bld 112 (H) 70 - 99 mg/dL   BUN 18 8 - 23 mg/dL   Creatinine, Ser 0.89 0.44 - 1.00 mg/dL   Calcium 9.2 8.9 - 10.3 mg/dL   GFR, Estimated >60 >60 mL/min   Anion gap 8 5 - 15  Glucose, capillary     Status: Abnormal   Collection Time: 09/10/22 10:17 AM  Result Value Ref Range   Glucose-Capillary 130 (H) 70 - 99 mg/dL  Glucose, capillary     Status: Abnormal   Collection Time: 09/10/22 12:05 PM  Result Value Ref Range   Glucose-Capillary 132 (H) 70 - 99 mg/dL    I have Reviewed nursing notes, Vitals, and Lab results since pt's last encounter. Pertinent lab results : see above I have ordered labwork to follow up on.  I have reviewed the last note from staff over past 24 hours I have discussed pt's care plan and test results with nursing staff, CM/SW, and other staff as appropriate  Time spent: Greater than 50%  of the 55 minute visit was spent in counseling/coordination of care for the patient as laid out in the A&P.   LOS: 16 days   Dwyane Dee, MD [facility] 09/10/2022, 2:36 PM

## 2022-09-10 NOTE — Evaluation (Signed)
Clinical/Bedside Swallow Evaluation Patient Details  Name: Kathleen Valenzuela MRN: [id number] Date of Birth: 09-14-1950  Today's Date: 09/10/2022 Time: SLP Start Time (ACUTE ONLY): 1450 SLP Stop Time (ACUTE ONLY): 1500 SLP Time Calculation (min) (ACUTE ONLY): 10 min  Past Medical History:  Past Medical History:  Diagnosis Date   Anemia    Anxiety    Aortic atherosclerosis ([place]) 04/21/2017   Arthritis    Bipolar 1 disorder (HCC)    Chronic back pain    COPD (chronic obstructive pulmonary disease) (HCC)    Diastolic dysfunction with heart failure ([place]) 04/20/2017   Duodenal stricture    Early onset Alzheimer's dementia (HCC)    GERD (gastroesophageal reflux disease)    Heart murmur    High cholesterol    Hypertension    Shortness of breath dyspnea    Past Surgical History:  Past Surgical History:  Procedure Laterality Date   BIOPSY  01/06/2017   Procedure: BIOPSY;  Surgeon: Rogene Houston, MD;  [facility];  Service: Endoscopy;;  esophageal biopsy   CESAREAN SECTION     COLONOSCOPY     ESOPHAGEAL DILATION N/A 10/29/2015   Procedure: ESOPHAGEAL DILATION;  Surgeon: Rogene Houston, MD;  [facility];  Service: Endoscopy;  Laterality: N/A;   ESOPHAGEAL DILATION N/A 01/29/2016   Procedure: ESOPHAGEAL DILATION;  Surgeon: Rogene Houston, MD;  [facility];  Service: Endoscopy;  Laterality: N/A;   ESOPHAGEAL DILATION N/A 01/06/2017   Procedure: ESOPHAGEAL DILATION;  Surgeon: Rogene Houston, MD;  [facility];  Service: Endoscopy;  Laterality: N/A;   ESOPHAGOGASTRODUODENOSCOPY N/A 10/29/2015   Procedure: ESOPHAGOGASTRODUODENOSCOPY (EGD);  Surgeon: Rogene Houston, MD;  [facility];  Service: Endoscopy;  Laterality: N/A;  1200   ESOPHAGOGASTRODUODENOSCOPY (EGD) WITH PROPOFOL N/A 01/29/2016   Procedure: ESOPHAGOGASTRODUODENOSCOPY (EGD) WITH PROPOFOL;  Surgeon: Rogene Houston, MD;  [facility];  Service: Endoscopy;  Laterality:  N/A;  7:30 - moved to 4/21 '@11'$ : 25 - Ann notified pt to arrive at 10:00   ESOPHAGOGASTRODUODENOSCOPY (EGD) WITH PROPOFOL N/A 01/06/2017   Procedure: ESOPHAGOGASTRODUODENOSCOPY (EGD) WITH PROPOFOL;  Surgeon: Rogene Houston, MD;  [facility];  Service: Endoscopy;  Laterality: N/A;  11:20   ESOPHAGOGASTRODUODENOSCOPY (EGD) WITH PROPOFOL N/A 02/17/2017   Procedure: ESOPHAGOGASTRODUODENOSCOPY (EGD) WITH PROPOFOL;  Surgeon: Rogene Houston, MD;  [facility];  Service: Endoscopy;  Laterality: N/A;  10:30   FOOT SURGERY Right    bunionectomy   HEMORRHOID SURGERY     IR PERC CHOLECYSTOSTOMY  08/27/2022   HPI:  72 yo F admitted from ALF with AMS and found to have sepsis, acute cholycystitis, metabolic encephalopathy. Cholecystotomy tube placement by IR on 08/27/2022. CXR 12/26: "Interval development of extensive bilateral airspace opacities and  small right pleural effusion." PMH significant for but not limited to:  anemia, anxiety, bipolar 1 disorder, COPD, GERD, heart murmur, hyperlipidemia, hypertension.    Assessment / Plan / Recommendation  Clinical Impression  Pt improved since prior eval. SHe was seen self feeding her puree tray and has apparently been asking for foods that arent pureed, like a peanut butter and jelly sandwich. She is very literal and if you give her a hypothetical question - like "If I gave you steamed vegetables could you chew that with your gums?" she would say " I dont like those." She did agree she is able to masticate a banana or soft meat. None of there were  available and pt would not eat crackers. Pt has a history of esophageal stenosis with dilation in 2018 so soft moist foods are still recommended. Recommend upgrade to mechanical soft as pt will choose appropriate foods. No SLP f/u needed. Will sign off.      Aspiration Risk       Diet Recommendation Dysphagia 3 (Mech soft);Thin liquid   Liquid Administration via: Cup;Straw Medication  Administration: Whole meds with liquid Supervision: Staff to assist with self feeding;Patient able to self feed    Other  Recommendations      Recommendations for follow up therapy are one component of a multi-disciplinary discharge planning process, led by the attending physician.  Recommendations may be updated based on patient status, additional functional criteria and insurance authorization.  Follow up Recommendations        Assistance Recommended at Discharge    Functional Status Assessment    Frequency and Duration            Prognosis        Swallow Study   General HPI: 72 yo F admitted from ALF with AMS and found to have sepsis, acute cholycystitis, metabolic encephalopathy. Cholecystotomy tube placement by IR on 08/27/2022. CXR 12/26: "Interval development of extensive bilateral airspace opacities and  small right pleural effusion." PMH significant for but not limited to:  anemia, anxiety, bipolar 1 disorder, COPD, GERD, heart murmur, hyperlipidemia, hypertension. Type of Study: Bedside Swallow Evaluation Previous Swallow Assessment: 08/31/22 - Dys1/thin Diet Prior to this Study: Dysphagia 1 (puree);Thin liquids Temperature Spikes Noted: No Respiratory Status: Nasal cannula History of Recent Intubation: No Behavior/Cognition: Alert;Confused;Requires cueing Oral Cavity Assessment: Within Functional Limits Oral Care Completed by SLP: No Oral Cavity - Dentition: Adequate natural dentition Vision: Functional for self-feeding Self-Feeding Abilities: Able to feed self Patient Positioning: Upright in bed Baseline Vocal Quality: Normal Volitional Cough: Strong Volitional Swallow: Able to elicit    Oral/Motor/Sensory Function Overall Oral Motor/Sensory Function: Within functional limits   Ice Chips     Thin Liquid Thin Liquid: Within functional limits    Nectar Thick Nectar Thick Liquid: Not tested   Honey Thick Honey Thick Liquid: Not tested   Puree Puree: Within  functional limits   Solid     Solid: Not tested      Guerline Happ, Katherene Ponto 09/10/2022,4:10 PM

## 2022-09-10 NOTE — H&P (View-Only) (Signed)
Progress Note  Primary GI: Unassigned, previously Dr. Laural Golden   Subjective  Chief Complaint: Cholecystitis, suspected choledocholithiasis with normal LFTs.  No family at bedside. Patient slightly tearful when I came in.  Stating she is having neck pain, still some right upper quadrant abdominal pain at [facility] tube. Patient denies nausea vomiting, fever or chills. Discussed possibly needing an ERCP, patient said states talk with her son [name] or friend [place].  But is willing to proceed with the procedure.    Objective   Vital signs in last 24 hours: Temp:  [97.8 F (36.6 C)-98.4 F (36.9 C)] 97.8 F (36.6 C) (01/06 1024) Pulse Rate:  [78-81] 81 (01/06 1024) Resp:  [17] 17 (01/05 1427) BP: (96-122)/(62-70) 96/62 (01/06 1024) SpO2:  [96 %-97 %] 96 % (01/06 1024) Last BM Date : 09/08/22 Last BM recorded by nurses in past 5 days Stool Type: Type 5 (Soft blobs with clear-cut edges) (09/10/2022 11:00 AM)  General:   female anxious, chronically ill-appearing Heart:  Regular rate and rhythm; Pulm: Clear anteriorly; no wheezing Abdomen:  Soft, Obese AB, Active bowel sounds. mild tenderness in the RUQ at drain site.  Scant serosanguineous liquid in drain. Extremities:  without  edema. Neurologic: Patient A&O x 1, no focal neurodeficits Psych: Anxious, tearful  Intake/Output from previous day: 01/05 0701 - 01/06 0700 In: 1000 [P.O.:1000] Out: 70 [Drains:70] Intake/Output this shift: Total I/O In: 954 [P.O.:717; Other:237] Out: -   Studies/Results: MR ABDOMEN MRCP W WO CONTAST  Result Date: 09/09/2022 CLINICAL DATA:  Cholecystitis.  Evaluate for choledocholithiasis. EXAM: MRI ABDOMEN WITHOUT AND WITH CONTRAST (INCLUDING MRCP) TECHNIQUE: Multiplanar multisequence MR imaging of the abdomen was performed both before and after the administration of intravenous contrast. Heavily T2-weighted images of the biliary and pancreatic ducts were obtained, and three-dimensional MRCP images were  rendered by post processing. CONTRAST:  67m GADAVIST GADOBUTROL 1 MMOL/ML IV SOLN COMPARISON:  CT AP 09/04/2022 FINDINGS: Lower chest: Trace left pleural effusion. Hepatobiliary: Exam detail is diminished due excessive motion artifact. No focal enhancing liver lesion identified. Gallbladder is decompressed around a percutaneous cholecystostomy tube. Mild intrahepatic bile duct dilatation. Fusiform dilatation of the CBD measures up to 1.6 cm. Irregular, eccentric filling defect within the proximal CBD measures 7 mm, image 34/7. Focal area of low signal at the level of the distal CBD is equivocal for impacted gallstone measuring 5 mm, image 18/10. Pancreas:  There is no main duct dilatation, inflammation or mass. Spleen: Nonenhancing cystic lesion within the posterior spleen measures 1.5 cm. Spleen is normal in size. Adrenals/Urinary Tract: Normal adrenal glands. Bilateral kidney cysts. No follow-up imaging recommended. No mass or hydronephrosis identified. Stomach/Bowel: Stomach appears normal.  No dilated bowel loops. Vascular/Lymphatic: The upper abdominal vascularity appears patent. Normal caliber abdominal aorta. No adenopathy. Other:  No significant free fluid or fluid collections. Musculoskeletal: Lumbar scoliosis deformity is convex towards the left. No suspicious bone lesions. IMPRESSION: 1. Exam detail is diminished due to excessive motion artifact. 2. Fusiform dilatation of the CBD measures up to 1.6 cm. Nonspecific, irregular, eccentric filling defect within the proximal CBD measures 7 mm this may represent a gallstone or postinflammatory debris. Focal area of low signal at the level of the distal CBD is equivocal for impacted gallstone measuring 5 mm. 3. Gallbladder is decompressed around a percutaneous cholecystostomy tube. Electronically Signed   By: TKerby MoorsM.D.   On: 09/09/2022 18:00   MR 3D Recon At Scanner  Result Date: 09/09/2022 CLINICAL DATA:  Cholecystitis.  Evaluate for  choledocholithiasis. EXAM: MRI ABDOMEN WITHOUT AND WITH CONTRAST (INCLUDING MRCP) TECHNIQUE: Multiplanar multisequence MR imaging of the abdomen was performed both before and after the administration of intravenous contrast. Heavily T2-weighted images of the biliary and pancreatic ducts were obtained, and three-dimensional MRCP images were rendered by post processing. CONTRAST:  63m GADAVIST GADOBUTROL 1 MMOL/ML IV SOLN COMPARISON:  CT AP 09/04/2022 FINDINGS: Lower chest: Trace left pleural effusion. Hepatobiliary: Exam detail is diminished due excessive motion artifact. No focal enhancing liver lesion identified. Gallbladder is decompressed around a percutaneous cholecystostomy tube. Mild intrahepatic bile duct dilatation. Fusiform dilatation of the CBD measures up to 1.6 cm. Irregular, eccentric filling defect within the proximal CBD measures 7 mm, image 34/7. Focal area of low signal at the level of the distal CBD is equivocal for impacted gallstone measuring 5 mm, image 18/10. Pancreas:  There is no main duct dilatation, inflammation or mass. Spleen: Nonenhancing cystic lesion within the posterior spleen measures 1.5 cm. Spleen is normal in size. Adrenals/Urinary Tract: Normal adrenal glands. Bilateral kidney cysts. No follow-up imaging recommended. No mass or hydronephrosis identified. Stomach/Bowel: Stomach appears normal.  No dilated bowel loops. Vascular/Lymphatic: The upper abdominal vascularity appears patent. Normal caliber abdominal aorta. No adenopathy. Other:  No significant free fluid or fluid collections. Musculoskeletal: Lumbar scoliosis deformity is convex towards the left. No suspicious bone lesions. IMPRESSION: 1. Exam detail is diminished due to excessive motion artifact. 2. Fusiform dilatation of the CBD measures up to 1.6 cm. Nonspecific, irregular, eccentric filling defect within the proximal CBD measures 7 mm this may represent a gallstone or postinflammatory debris. Focal area of low signal  at the level of the distal CBD is equivocal for impacted gallstone measuring 5 mm. 3. Gallbladder is decompressed around a percutaneous cholecystostomy tube. Electronically Signed   By: TKerby MoorsM.D.   On: 09/09/2022 18:00   [place] SITE RITE  Result Date: 09/08/2022 If Site Rite image not attached, placement could not be confirmed due to current cardiac rhythm.   Lab Results: Recent Labs    09/08/22 0307 09/09/22 0527 09/10/22 0359  WBC 19.1* 15.5* 14.0*  HGB 12.1 13.0 13.2  HCT 38.6 40.1 39.4  PLT 472* 424* 383   BMET Recent Labs    09/08/22 0307 09/09/22 0527 09/10/22 0359  NA 137 137 135  K 3.8 3.9 4.3  CL 106 106 104  CO2 '22 23 23  '$ GLUCOSE 127* 113* 112*  BUN '18 20 18  '$ CREATININE 0.86 0.85 0.89  CALCIUM 9.4 9.6 9.2   LFT Recent Labs    09/10/22 0350  PROT 7.6  ALBUMIN 2.9*  AST 24  ALT 17  ALKPHOS 71  BILITOT 0.5  BILIDIR <0.1  IBILI NOT CALCULATED   PT/INR No results for input(s): "LABPROT", "INR" in the last 72 hours.   Scheduled Meds:  atorvastatin  40 mg Oral QHS   benztropine  0.5 mg Oral BID   buPROPion  150 mg Oral q morning   Chlorhexidine Gluconate Cloth  6 each Topical Daily   donepezil  10 mg Oral Q2000   feeding supplement  237 mL Oral TID WC   fluticasone  1 spray Each Nare Daily   fluticasone furoate-vilanterol  1 puff Inhalation Daily   gabapentin  300 mg Oral TID   isosorbide mononitrate  15 mg Oral Daily   lurasidone  80 mg Oral QHS   memantine  10 mg Oral BID   metoprolol succinate  25 mg Oral Daily  multivitamin with minerals  1 tablet Oral Daily   pantoprazole  40 mg Oral BID AC   sodium chloride flush  10-40 mL Intracatheter Q12H   sodium chloride flush  5 mL Intracatheter Q8H   tiZANidine  2 mg Oral BID   traZODone  100 mg Oral QHS   venlafaxine XR  300 mg Oral Daily   Continuous Infusions:  piperacillin-tazobactam (ZOSYN)  IV 3.375 g (09/10/22 4585)      Patient profile:   72 year old female with history of  HFpE, A-fib on Eliquis, COPD, dementia, GERD, gastric ulcers, and duodenal stenosis presented with AMS, found to have acute cholecystitis s/p PCT on 08/27/22.    Impression/Plan:   Acute cholecystitis WBC 14.0 HGB 13.2 Platelets 383 AST 24 ALT 17  Alkphos 71 TBili 0.5 08/25/2022 INR 1.1  08/26/2022 Lactic acid 1.5 Status post PCT 08/27/2022 with IR Continues on cefepime and Flagyl Filling defects on ultrasound and MRCP, cholangiogram revealed appropriately positioned tube functioning persistent occlusion of cystic duct without opacification of CBD. Continues to have unremarkable LFTs Hospitalist repeated MRCP yesterday due to continuing leukocytosis and found to have equivocal CBD stone and debris in CBD.  Continues to have normal LFTs. Due to persistent leukocytosis while on antibiotics and MRCP yesterday confirming CBD stone, will discuss setting up ERCP with the son. I thoroughly discussed procedure with the patient to include nature, alternatives, benefits, and risks (including but not limited to post ERCP pancreatitis, bleeding, infection, perforation, anesthesia/cardiac pulmonary complications).  Due to patient's dementia, did verbal consent with son on the phone with witness.  This will be in the patient's chart.  Right adnexal and right anterior lateral abscesses  not felt to be percutaneous drainage necessary at this time  A-fib Last dose of Eliquis this morning, currently on hold Last dose was 09/09/2022 2325  Principal Problem:   Sepsis ([place]) Active Problems:   Bipolar disorder ([place])   High cholesterol   GERD (gastroesophageal reflux disease)   COPD (chronic obstructive pulmonary disease) ([place])   Essential hypertension   Diastolic dysfunction with heart failure (HCC)   Acute cholecystitis   Atrial fibrillation, chronic ([place])   Dementia with behavioral disturbance ([place])   Acute metabolic encephalopathy   Goals of care, counseling/discussion   Leukocytosis    LOS:  16 days   Vladimir Crofts  09/10/2022, 1:00 PM

## 2022-09-10 NOTE — Progress Notes (Signed)
Brief Nutrition Note  Received nutrition consult for enteral/tube feeding initiation and management. However, per discussion with Dr. Sabino Gasser plan to hold off on placing tube and starting tube feeds at this time.  However, MD requested TF recommendations in event tube is placed.   TF recommendations as below: Osmolite 1.2 at 60 ml/h (1440 ml per day) *Would recommend starting at 65m/hr and advancing 154mQ12H with close monitoring of electrolytes Prosource TF20 60 ml daily Provides 1808 kcal, 100 gm protein, 1181 ml free water daily  - Would recommend 10028m4H free water flushes. With tube feed this would provide 1781m33my. - Would recommend '100mg'$  thiamine x5 days given refeeding risk.   - If starting TF, monitor magnesium, potassium, and phosphorus BID for at least 3 days. Pt is at risk for refeeding syndrome given inadequate oral intake since admission over 2 weeks ago.    AspeSamson Frederic LDN For contact information, refer to [facility]

## 2022-09-10 NOTE — Progress Notes (Signed)
Progress Note  Primary GI: Unassigned, previously Dr. Laural Golden   Subjective  Chief Complaint: Cholecystitis, suspected choledocholithiasis with normal LFTs.  No family at bedside. Patient slightly tearful when I came in.  Stating she is having neck pain, still some right upper quadrant abdominal pain at [facility] tube. Patient denies nausea vomiting, fever or chills. Discussed possibly needing an ERCP, patient said states talk with her son [name] or friend [place].  But is willing to proceed with the procedure.    Objective   Vital signs in last 24 hours: Temp:  [97.8 F (36.6 C)-98.4 F (36.9 C)] 97.8 F (36.6 C) (01/06 1024) Pulse Rate:  [78-81] 81 (01/06 1024) Resp:  [17] 17 (01/05 1427) BP: (96-122)/(62-70) 96/62 (01/06 1024) SpO2:  [96 %-97 %] 96 % (01/06 1024) Last BM Date : 09/08/22 Last BM recorded by nurses in past 5 days Stool Type: Type 5 (Soft blobs with clear-cut edges) (09/10/2022 11:00 AM)  General:   female anxious, chronically ill-appearing Heart:  Regular rate and rhythm; Pulm: Clear anteriorly; no wheezing Abdomen:  Soft, Obese AB, Active bowel sounds. mild tenderness in the RUQ at drain site.  Scant serosanguineous liquid in drain. Extremities:  without  edema. Neurologic: Patient A&O x 1, no focal neurodeficits Psych: Anxious, tearful  Intake/Output from previous day: 01/05 0701 - 01/06 0700 In: 1000 [P.O.:1000] Out: 70 [Drains:70] Intake/Output this shift: Total I/O In: 954 [P.O.:717; Other:237] Out: -   Studies/Results: MR ABDOMEN MRCP W WO CONTAST  Result Date: 09/09/2022 CLINICAL DATA:  Cholecystitis.  Evaluate for choledocholithiasis. EXAM: MRI ABDOMEN WITHOUT AND WITH CONTRAST (INCLUDING MRCP) TECHNIQUE: Multiplanar multisequence MR imaging of the abdomen was performed both before and after the administration of intravenous contrast. Heavily T2-weighted images of the biliary and pancreatic ducts were obtained, and three-dimensional MRCP images were  rendered by post processing. CONTRAST:  81m GADAVIST GADOBUTROL 1 MMOL/ML IV SOLN COMPARISON:  CT AP 09/04/2022 FINDINGS: Lower chest: Trace left pleural effusion. Hepatobiliary: Exam detail is diminished due excessive motion artifact. No focal enhancing liver lesion identified. Gallbladder is decompressed around a percutaneous cholecystostomy tube. Mild intrahepatic bile duct dilatation. Fusiform dilatation of the CBD measures up to 1.6 cm. Irregular, eccentric filling defect within the proximal CBD measures 7 mm, image 34/7. Focal area of low signal at the level of the distal CBD is equivocal for impacted gallstone measuring 5 mm, image 18/10. Pancreas:  There is no main duct dilatation, inflammation or mass. Spleen: Nonenhancing cystic lesion within the posterior spleen measures 1.5 cm. Spleen is normal in size. Adrenals/Urinary Tract: Normal adrenal glands. Bilateral kidney cysts. No follow-up imaging recommended. No mass or hydronephrosis identified. Stomach/Bowel: Stomach appears normal.  No dilated bowel loops. Vascular/Lymphatic: The upper abdominal vascularity appears patent. Normal caliber abdominal aorta. No adenopathy. Other:  No significant free fluid or fluid collections. Musculoskeletal: Lumbar scoliosis deformity is convex towards the left. No suspicious bone lesions. IMPRESSION: 1. Exam detail is diminished due to excessive motion artifact. 2. Fusiform dilatation of the CBD measures up to 1.6 cm. Nonspecific, irregular, eccentric filling defect within the proximal CBD measures 7 mm this may represent a gallstone or postinflammatory debris. Focal area of low signal at the level of the distal CBD is equivocal for impacted gallstone measuring 5 mm. 3. Gallbladder is decompressed around a percutaneous cholecystostomy tube. Electronically Signed   By: TKerby MoorsM.D.   On: 09/09/2022 18:00   MR 3D Recon At Scanner  Result Date: 09/09/2022 CLINICAL DATA:  Cholecystitis.  Evaluate for  choledocholithiasis. EXAM: MRI ABDOMEN WITHOUT AND WITH CONTRAST (INCLUDING MRCP) TECHNIQUE: Multiplanar multisequence MR imaging of the abdomen was performed both before and after the administration of intravenous contrast. Heavily T2-weighted images of the biliary and pancreatic ducts were obtained, and three-dimensional MRCP images were rendered by post processing. CONTRAST:  38m GADAVIST GADOBUTROL 1 MMOL/ML IV SOLN COMPARISON:  CT AP 09/04/2022 FINDINGS: Lower chest: Trace left pleural effusion. Hepatobiliary: Exam detail is diminished due excessive motion artifact. No focal enhancing liver lesion identified. Gallbladder is decompressed around a percutaneous cholecystostomy tube. Mild intrahepatic bile duct dilatation. Fusiform dilatation of the CBD measures up to 1.6 cm. Irregular, eccentric filling defect within the proximal CBD measures 7 mm, image 34/7. Focal area of low signal at the level of the distal CBD is equivocal for impacted gallstone measuring 5 mm, image 18/10. Pancreas:  There is no main duct dilatation, inflammation or mass. Spleen: Nonenhancing cystic lesion within the posterior spleen measures 1.5 cm. Spleen is normal in size. Adrenals/Urinary Tract: Normal adrenal glands. Bilateral kidney cysts. No follow-up imaging recommended. No mass or hydronephrosis identified. Stomach/Bowel: Stomach appears normal.  No dilated bowel loops. Vascular/Lymphatic: The upper abdominal vascularity appears patent. Normal caliber abdominal aorta. No adenopathy. Other:  No significant free fluid or fluid collections. Musculoskeletal: Lumbar scoliosis deformity is convex towards the left. No suspicious bone lesions. IMPRESSION: 1. Exam detail is diminished due to excessive motion artifact. 2. Fusiform dilatation of the CBD measures up to 1.6 cm. Nonspecific, irregular, eccentric filling defect within the proximal CBD measures 7 mm this may represent a gallstone or postinflammatory debris. Focal area of low signal  at the level of the distal CBD is equivocal for impacted gallstone measuring 5 mm. 3. Gallbladder is decompressed around a percutaneous cholecystostomy tube. Electronically Signed   By: TKerby MoorsM.D.   On: 09/09/2022 18:00   [place] SITE RITE  Result Date: 09/08/2022 If Site Rite image not attached, placement could not be confirmed due to current cardiac rhythm.   Lab Results: Recent Labs    09/08/22 0307 09/09/22 0527 09/10/22 0359  WBC 19.1* 15.5* 14.0*  HGB 12.1 13.0 13.2  HCT 38.6 40.1 39.4  PLT 472* 424* 383   BMET Recent Labs    09/08/22 0307 09/09/22 0527 09/10/22 0359  NA 137 137 135  K 3.8 3.9 4.3  CL 106 106 104  CO2 '22 23 23  '$ GLUCOSE 127* 113* 112*  BUN '18 20 18  '$ CREATININE 0.86 0.85 0.89  CALCIUM 9.4 9.6 9.2   LFT Recent Labs    09/10/22 0350  PROT 7.6  ALBUMIN 2.9*  AST 24  ALT 17  ALKPHOS 71  BILITOT 0.5  BILIDIR <0.1  IBILI NOT CALCULATED   PT/INR No results for input(s): "LABPROT", "INR" in the last 72 hours.   Scheduled Meds:  atorvastatin  40 mg Oral QHS   benztropine  0.5 mg Oral BID   buPROPion  150 mg Oral q morning   Chlorhexidine Gluconate Cloth  6 each Topical Daily   donepezil  10 mg Oral Q2000   feeding supplement  237 mL Oral TID WC   fluticasone  1 spray Each Nare Daily   fluticasone furoate-vilanterol  1 puff Inhalation Daily   gabapentin  300 mg Oral TID   isosorbide mononitrate  15 mg Oral Daily   lurasidone  80 mg Oral QHS   memantine  10 mg Oral BID   metoprolol succinate  25 mg Oral Daily  multivitamin with minerals  1 tablet Oral Daily   pantoprazole  40 mg Oral BID AC   sodium chloride flush  10-40 mL Intracatheter Q12H   sodium chloride flush  5 mL Intracatheter Q8H   tiZANidine  2 mg Oral BID   traZODone  100 mg Oral QHS   venlafaxine XR  300 mg Oral Daily   Continuous Infusions:  piperacillin-tazobactam (ZOSYN)  IV 3.375 g (09/10/22 5993)      Patient profile:   72 year old female with history of  HFpE, A-fib on Eliquis, COPD, dementia, GERD, gastric ulcers, and duodenal stenosis presented with AMS, found to have acute cholecystitis s/p PCT on 08/27/22.    Impression/Plan:   Acute cholecystitis WBC 14.0 HGB 13.2 Platelets 383 AST 24 ALT 17  Alkphos 71 TBili 0.5 08/25/2022 INR 1.1  08/26/2022 Lactic acid 1.5 Status post PCT 08/27/2022 with IR Continues on cefepime and Flagyl Filling defects on ultrasound and MRCP, cholangiogram revealed appropriately positioned tube functioning persistent occlusion of cystic duct without opacification of CBD. Continues to have unremarkable LFTs Hospitalist repeated MRCP yesterday due to continuing leukocytosis and found to have equivocal CBD stone and debris in CBD.  Continues to have normal LFTs. Due to persistent leukocytosis while on antibiotics and MRCP yesterday confirming CBD stone, will discuss setting up ERCP with the son. I thoroughly discussed procedure with the patient to include nature, alternatives, benefits, and risks (including but not limited to post ERCP pancreatitis, bleeding, infection, perforation, anesthesia/cardiac pulmonary complications).  Due to patient's dementia, did verbal consent with son on the phone with witness.  This will be in the patient's chart.  Right adnexal and right anterior lateral abscesses  not felt to be percutaneous drainage necessary at this time  A-fib Last dose of Eliquis this morning, currently on hold Last dose was 09/09/2022 2325  Principal Problem:   Sepsis ([place]) Active Problems:   Bipolar disorder ([place])   High cholesterol   GERD (gastroesophageal reflux disease)   COPD (chronic obstructive pulmonary disease) ([place])   Essential hypertension   Diastolic dysfunction with heart failure (HCC)   Acute cholecystitis   Atrial fibrillation, chronic ([place])   Dementia with behavioral disturbance ([place])   Acute metabolic encephalopathy   Goals of care, counseling/discussion   Leukocytosis    LOS:  16 days   Vladimir Crofts  09/10/2022, 1:00 PM

## 2022-09-10 NOTE — Progress Notes (Signed)
Mobility Specialist Progress Note   09/10/22 1600  Mobility  Activity Stood at bedside;Dangled on edge of bed (UE/LE ROM & Exercise)  Level of Assistance Contact guard assist, steadying assist  Assistive Device None;Other (Comment) (HHA)  Range of Motion/Exercises Active;All extremities  Activity Response Tolerated well   Patient received in supine, asleep and needing stimuli to stay aroused. Pleasantly agreeable to participate. Presented A&O to self only. Deferred ambulation secondary to lethargy but opted to seated exercises. Requires minimal HHA for bed mobility and completed various UE/LE AROM and exercises. Also completed x3 STS with min guard and without AD.  Tolerated without complaint or incident. Was assisted into supine with min A and was left with all needs met, call bell in reach.   Kathleen Valenzuela, BS EXP Mobility Specialist Please contact via SecureChat or Rehab office at [phone]

## 2022-09-11 ENCOUNTER — Inpatient Hospital Stay (HOSPITAL_COMMUNITY): Payer: Medicare Other

## 2022-09-11 ENCOUNTER — Encounter (HOSPITAL_COMMUNITY): Payer: Self-pay | Admitting: Family Medicine

## 2022-09-11 ENCOUNTER — Inpatient Hospital Stay (HOSPITAL_COMMUNITY): Payer: Medicare Other | Admitting: Certified Registered"

## 2022-09-11 ENCOUNTER — Encounter (HOSPITAL_COMMUNITY)
Admission: EM | Disposition: A | Discharge status: Skilled Nursing Facility | Payer: Self-pay | Source: Skilled Nursing Facility | Attending: Internal Medicine

## 2022-09-11 DIAGNOSIS — K2970 Gastritis, unspecified, without bleeding: Secondary | ICD-10-CM

## 2022-09-11 DIAGNOSIS — I110 Hypertensive heart disease with heart failure: Secondary | ICD-10-CM

## 2022-09-11 DIAGNOSIS — F03918 Unspecified dementia, unspecified severity, with other behavioral disturbance: Secondary | ICD-10-CM | POA: Diagnosis not present

## 2022-09-11 DIAGNOSIS — A419 Sepsis, unspecified organism: Secondary | ICD-10-CM | POA: Diagnosis not present

## 2022-09-11 DIAGNOSIS — I509 Heart failure, unspecified: Secondary | ICD-10-CM

## 2022-09-11 DIAGNOSIS — Z87891 Personal history of nicotine dependence: Secondary | ICD-10-CM

## 2022-09-11 DIAGNOSIS — Z7189 Other specified counseling: Secondary | ICD-10-CM | POA: Diagnosis not present

## 2022-09-11 DIAGNOSIS — K8050 Calculus of bile duct without cholangitis or cholecystitis without obstruction: Secondary | ICD-10-CM

## 2022-09-11 DIAGNOSIS — J449 Chronic obstructive pulmonary disease, unspecified: Secondary | ICD-10-CM

## 2022-09-11 DIAGNOSIS — K810 Acute cholecystitis: Secondary | ICD-10-CM | POA: Diagnosis not present

## 2022-09-11 DIAGNOSIS — K838 Other specified diseases of biliary tract: Secondary | ICD-10-CM

## 2022-09-11 HISTORY — PX: REMOVAL OF STONES: SHX5545

## 2022-09-11 HISTORY — PX: SPHINCTEROTOMY: SHX5544

## 2022-09-11 HISTORY — PX: BIOPSY: SHX5522

## 2022-09-11 HISTORY — PX: ERCP: SHX5425

## 2022-09-11 LAB — BASIC METABOLIC PANEL
Anion gap: 13 (ref 5–15)
BUN: 19 mg/dL (ref 8–23)
CO2: 20 mmol/L — ABNORMAL LOW (ref 22–32)
Calcium: 8.8 mg/dL — ABNORMAL LOW (ref 8.9–10.3)
Chloride: 102 mmol/L (ref 98–111)
Creatinine, Ser: 0.89 mg/dL (ref 0.44–1.00)
GFR, Estimated: >60 mL/min (ref >60)
Glucose, Bld: 111 mg/dL — ABNORMAL HIGH (ref 70–99)
Potassium: 3.9 mmol/L (ref 3.5–5.1)
Sodium: 135 mmol/L (ref 135–145)

## 2022-09-11 LAB — GLUCOSE, CAPILLARY
Glucose-Capillary: 177 mg/dL — ABNORMAL HIGH (ref 70–99)
Glucose-Capillary: 190 mg/dL — ABNORMAL HIGH (ref 70–99)

## 2022-09-11 LAB — CBC WITH DIFFERENTIAL/PLATELET
Abs Immature Granulocytes: 0.14 10*3/uL — ABNORMAL HIGH (ref 0.00–0.07)
Basophils Absolute: 0.1 10*3/uL (ref 0.0–0.1)
Basophils Relative: 1 %
Eosinophils Absolute: 0.4 10*3/uL (ref 0.0–0.5)
Eosinophils Relative: 4 %
HCT: 37.3 % (ref 36.0–46.0)
Hemoglobin: 12.2 g/dL (ref 12.0–15.0)
Immature Granulocytes: 1 %
Lymphocytes Relative: 24 %
Lymphs Abs: 2.8 10*3/uL (ref 0.7–4.0)
MCH: 30 pg (ref 26.0–34.0)
MCHC: 32.7 g/dL (ref 30.0–36.0)
MCV: 91.6 fL (ref 80.0–100.0)
Monocytes Absolute: 1.2 10*3/uL — ABNORMAL HIGH (ref 0.1–1.0)
Monocytes Relative: 11 %
Neutro Abs: 6.7 10*3/uL (ref 1.7–7.7)
Neutrophils Relative %: 59 %
Platelets: 366 10*3/uL (ref 150–400)
RBC: 4.07 MIL/uL (ref 3.87–5.11)
RDW: 15.5 % (ref 11.5–15.5)
WBC: 11.4 10*3/uL — ABNORMAL HIGH (ref 4.0–10.5)
nRBC: 0 % (ref 0.0–0.2)

## 2022-09-11 LAB — MAGNESIUM: Magnesium: 2 mg/dL (ref 1.7–2.4)

## 2022-09-11 SURGERY — ERCP, WITH INTERVENTION IF INDICATED
Anesthesia: General

## 2022-09-11 MED ORDER — ONDANSETRON HCL 4 MG/2ML IJ SOLN
INTRAMUSCULAR | Status: DC | PRN
  Administered 2022-09-11: 4 mg via INTRAVENOUS

## 2022-09-11 MED ORDER — GLUCAGON HCL RDNA (DIAGNOSTIC) 1 MG IJ SOLR
INTRAMUSCULAR | Status: AC
  Filled 2022-09-11: qty 1

## 2022-09-11 MED ORDER — PHENYLEPHRINE 80 MCG/ML (10ML) SYRINGE FOR IV PUSH (FOR BLOOD PRESSURE SUPPORT)
PREFILLED_SYRINGE | INTRAVENOUS | Status: DC | PRN
  Administered 2022-09-11: 160 ug via INTRAVENOUS
  Administered 2022-09-11: 80 ug via INTRAVENOUS

## 2022-09-11 MED ORDER — LIDOCAINE 2% (20 MG/ML) 5 ML SYRINGE
INTRAMUSCULAR | Status: DC | PRN
  Administered 2022-09-11: 60 mg via INTRAVENOUS

## 2022-09-11 MED ORDER — DEXAMETHASONE SODIUM PHOSPHATE 10 MG/ML IJ SOLN
INTRAMUSCULAR | Status: DC | PRN
  Administered 2022-09-11: 10 mg via INTRAVENOUS

## 2022-09-11 MED ORDER — DICLOFENAC SUPPOSITORY 100 MG
RECTAL | Status: DC | PRN
  Administered 2022-09-11: 100 mg via RECTAL

## 2022-09-11 MED ORDER — GLUCAGON HCL RDNA (DIAGNOSTIC) 1 MG IJ SOLR
INTRAMUSCULAR | Status: DC | PRN
  Administered 2022-09-11 (×2): .25 mg via INTRAVENOUS

## 2022-09-11 MED ORDER — LACTATED RINGERS IV SOLN: INTRAVENOUS | Status: DC | PRN

## 2022-09-11 MED ORDER — SUGAMMADEX SODIUM 200 MG/2ML IV SOLN
INTRAVENOUS | Status: DC | PRN
  Administered 2022-09-11: 200 mg via INTRAVENOUS

## 2022-09-11 MED ORDER — DICLOFENAC SUPPOSITORY 100 MG
RECTAL | Status: AC
  Filled 2022-09-11: qty 1

## 2022-09-11 MED ORDER — SODIUM CHLORIDE 0.9 % IV SOLN: INTRAVENOUS | Status: DC

## 2022-09-11 MED ORDER — FENTANYL CITRATE (PF) 250 MCG/5ML IJ SOLN
INTRAMUSCULAR | Status: DC | PRN
  Administered 2022-09-11 (×2): 50 ug via INTRAVENOUS

## 2022-09-11 MED ORDER — ROCURONIUM BROMIDE 10 MG/ML (PF) SYRINGE
PREFILLED_SYRINGE | INTRAVENOUS | Status: DC | PRN
  Administered 2022-09-11: 60 mg via INTRAVENOUS

## 2022-09-11 MED ORDER — SODIUM CHLORIDE 0.9 % IV SOLN
INTRAVENOUS | Status: DC | PRN
  Administered 2022-09-11: 30 mL

## 2022-09-11 MED ORDER — PROPOFOL 10 MG/ML IV BOLUS
INTRAVENOUS | Status: DC | PRN
  Administered 2022-09-11: 100 mg via INTRAVENOUS

## 2022-09-11 NOTE — Interval H&P Note (Signed)
History and Physical Interval Note:  09/11/2022 7:32 AM  Kathleen Valenzuela  has presented today for surgery, with the diagnosis of Choledocholithiasis.  The various methods of treatment have been discussed with the patient and family. After consideration of risks, benefits and other options for treatment, the patient has consented to  Procedure(s): ENDOSCOPIC RETROGRADE CHOLANGIOPANCREATOGRAPHY (ERCP) (N/A) as a surgical intervention.  The patient's history has been reviewed, patient examined, no change in status, stable for surgery.  I have reviewed the patient's chart and labs.  Questions were answered to the patient's satisfaction.     The risks of an ERCP were discussed at length, including but not limited to the risk of perforation, bleeding, abdominal pain, post-ERCP pancreatitis (while usually mild can be severe and even life threatening).    [employer]

## 2022-09-11 NOTE — Anesthesia Procedure Notes (Signed)
Procedure Name: Intubation Date/Time: 09/11/2022 8:03 AM  Performed by: Lance Coon, CRNAPre-anesthesia Checklist: Patient identified, Emergency Drugs available, Suction available, Patient being monitored and Timeout performed Patient Re-evaluated:Patient Re-evaluated prior to induction Oxygen Delivery Method: Circle system utilized Preoxygenation: Pre-oxygenation with 100% oxygen Induction Type: IV induction Ventilation: Mask ventilation without difficulty Laryngoscope Size: Miller and 3 Grade View: Grade I Tube type: Oral Tube size: 7.0 mm Number of attempts: 1 Airway Equipment and Method: Stylet Placement Confirmation: ETT inserted through vocal cords under direct vision, positive ETCO2 and breath sounds checked- equal and bilateral Secured at: 21 cm Tube secured with: Tape Dental Injury: Teeth and Oropharynx as per pre-operative assessment

## 2022-09-11 NOTE — Anesthesia Preprocedure Evaluation (Addendum)
Anesthesia Evaluation  Patient identified by MRN, date of birth, ID bandGeneral Assessment Comment:Patient responds to commands   Reviewed: Allergy & Precautions, NPO status , Patient's Chart, lab work & pertinent test results  Airway Mallampati: III  TM Distance: >3 FB Neck ROM: Full    Dental  (+) Edentulous Upper, Edentulous Lower   Pulmonary COPD,  COPD inhaler, former smoker   Pulmonary exam normal        Cardiovascular hypertension, Pt. on home beta blockers +CHF  Normal cardiovascular exam+ dysrhythmias      Neuro/Psych  PSYCHIATRIC DISORDERS Anxiety  Bipolar Disorder  Dementia negative neurological ROS     GI/Hepatic Neg liver ROS, PUD,GERD  ,,  Endo/Other  negative endocrine ROS    Renal/GU negative Renal ROS     Musculoskeletal  (+) Arthritis ,    Abdominal   Peds  Hematology  (+) Blood dyscrasia   Anesthesia Other Findings Choledocholithiasis  Reproductive/Obstetrics                             Anesthesia Physical Anesthesia Plan  ASA: 3  Anesthesia Plan: General   Post-op Pain Management:    Induction: Intravenous  PONV Risk Score and Plan: 3 and Ondansetron, Dexamethasone and Treatment may vary due to age or medical condition  Airway Management Planned: Oral ETT  Additional Equipment:   Intra-op Plan:   Post-operative Plan: Extubation in OR  Informed Consent:      History available from chart only  Plan Discussed with: CRNA  Anesthesia Plan Comments:         Anesthesia Quick Evaluation

## 2022-09-11 NOTE — Anesthesia Postprocedure Evaluation (Signed)
Anesthesia Post Note  Patient: [name]  Procedure(s) Performed: ENDOSCOPIC RETROGRADE CHOLANGIOPANCREATOGRAPHY (ERCP) SPHINCTEROTOMY REMOVAL OF STONES BIOPSY     Patient location during evaluation: PACU Anesthesia Type: General Level of consciousness: responds to stimulation Pain management: pain level controlled Vital Signs Assessment: post-procedure vital signs reviewed and stable Respiratory status: spontaneous breathing, nonlabored ventilation and respiratory function stable Cardiovascular status: blood pressure returned to baseline and stable Postop Assessment: no apparent nausea or vomiting Anesthetic complications: no   No notable events documented.  Last Vitals:  Vitals:   09/11/22 1014 09/11/22 1424  BP: 123/64 125/68  Pulse: 81 84  Resp: 14 20  Temp: 37 C 36.9 C  SpO2: 97% 97%    Last Pain:  Vitals:   09/11/22 1424  TempSrc: Oral  PainSc:                  Drexel Ivey P Arali Somera

## 2022-09-11 NOTE — Transfer of Care (Signed)
Immediate Anesthesia Transfer of Care Note  Patient: Kathleen Valenzuela  Procedure(s) Performed: ENDOSCOPIC RETROGRADE CHOLANGIOPANCREATOGRAPHY (ERCP) SPHINCTEROTOMY REMOVAL OF STONES BIOPSY  Patient Location: PACU  Anesthesia Type:General  Level of Consciousness: drowsy and patient cooperative  Airway & Oxygen Therapy: Patient Spontanous Breathing and Patient connected to nasal cannula oxygen  Post-op Assessment: Report given to RN and Post -op Vital signs reviewed and stable  Post vital signs: Reviewed and stable  Last Vitals:  Vitals Value Taken Time  BP 140/55 09/11/22 0854  Temp    Pulse 88 09/11/22 0856  Resp 14 09/11/22 0856  SpO2 87 % 09/11/22 0856  Vitals shown include unvalidated device data.  Last Pain:  Vitals:   09/11/22 0720  TempSrc: Temporal  PainSc: 0-No pain      Patients Stated Pain Goal: 0 (67/67/20 9470)  Complications: No notable events documented.

## 2022-09-11 NOTE — Op Note (Signed)
[facility] Patient Name: Kathleen Valenzuela Procedure Date : 09/11/2022 MRN: [id number] Attending MD: Justice Britain , MD, [id number] Date of Birth: February 04, 1951 CSN: [id number] Age: 72 Admit Type: Inpatient Procedure:                ERCP Indications:              Bile duct stone(s), Abdominal pain in the right                            upper quadrant, Anorexia, Weight loss Providers:                Justice Britain, MD, Carlyn Reichert, RN, Brien Mates, Technician Referring MD:             Inpatient Medical Service Medicines:                General Anesthesia, Glucagon 0.5 mg IV, Diclofenac                            100 mg rectal, Antibiotics already on schedule for                            patient so none given during procedure Complications:            No immediate complications. Estimated Blood Loss:     Estimated blood loss was minimal. Procedure:                Pre-Anesthesia Assessment:                           - Prior to the procedure, a History and Physical                            was performed, and patient medications and                            allergies were reviewed. The patient's tolerance of                            previous anesthesia was also reviewed. The risks                            and benefits of the procedure and the sedation                            options and risks were discussed with the patient.                            All questions were answered, and informed consent                            was obtained. Prior Anticoagulants: The patient has  taken Eliquis (apixaban), last dose was 2 days                            prior to procedure. ASA Grade Assessment: III - A                            patient with severe systemic disease. After                            reviewing the risks and benefits, the patient was                            deemed in satisfactory condition  to undergo the                            procedure.                           After obtaining informed consent, the scope was                            passed under direct vision. Throughout the                            procedure, the patient's blood pressure, pulse, and                            oxygen saturations were monitored continuously. The                            TJF-Q190V ([id number] Olympus duodenoscope was                            introduced through the mouth, and used to inject                            contrast into and used to inject contrast into the                            bile duct. The ERCP was accomplished without                            difficulty. The patient tolerated the procedure. Scope In: Scope Out: Findings:      A scout film of the abdomen was obtained. One percutaneous drain ending       in the Right upper quadrant was seen.      The upper GI tract was traversed under direct vision without detailed       examination. Patchy moderate inflammation characterized by erosions,       erythema and friability was found in the entire examined stomach -       biopsies obtained for HP evaluation. No gross lesions were noted in the       duodenal bulb, in the first portion of the duodenum and in the second  portion of the duodenum. The major papilla was normal.      A short 0.035 inch Soft Jagwire was passed into the biliary tree. The       Hydratome sphincterotome was passed over the guidewire and the bile duct       was then deeply cannulated. Contrast was injected. I personally       interpreted the bile duct images. Ductal flow of contrast was adequate.       Image quality was adequate. Contrast extended to the hepatic ducts.       Opacification of the entire biliary tree except for the cystic duct and       gallbladder was successful. The middle third of the main bile duct and       upper third of the main bile duct were moderately dilated. The  largest       diameter was 16 mm. The lower third of the main bile duct contained a       filling defect thought to be a stone and sludge. An 8 mm biliary       sphincterotomy was made with a monofilament Hydratome sphincterotome       using ERBE electrocautery. There was no post-sphincterotomy bleeding.       The biliary tree was swept with a retrieval balloon. Sludge was swept       from the duct. Two stones were removed. No stones remained. An occlusion       cholangiogram was performed that showed no further significant biliary       pathology.      A pancreatogram was not performed.      The duodenoscope was withdrawn from the patient. Impression:               - Gastritis - biopsied for HP evaluation.                           - No gross lesions in the duodenal bulb, in the                            first portion of the duodenum and in the second                            portion of the duodenum.                           - The major papilla appeared normal.                           - A filling defect consistent with a stone and                            sludge was seen on the cholangiogram.                           - The upper third of the main bile duct and middle                            third of the main bile duct were moderately dilated.                           -  Choledocholithiasis was found. Complete removal                            was accomplished by biliary sphincterotomy and                            balloon sweep.                           - The cystic duct did not fill during today's                            procedure. Recommendation:           - The patient will be observed post-procedure,                            until all discharge criteria are met.                           - Return patient to hospital ward for ongoing care.                           - Advance diet as tolerated.                           - Continue Protonix twice daily.                            - Hold on restarting Eliquis for 2-days (1/9 AM) to                            decrease risk of post-interventional bleeding. If                            VTE PPx is required or earlier anticoagulation is                            needed, then may use Heparin gtt in 6 hours without                            bolus (200PM on 1/7).                           - Watch for pancreatitis, bleeding, perforation,                            and cholangitis.                           - Check liver enzymes (AST, ALT, alkaline                            phosphatase, bilirubin) in the morning.                           -  Await path results.                           - Further discussions with General Surgery and IR                            as to long-term plan of cholecystostomy tube.                           - The findings and recommendations were discussed                            with the patient.                           - The findings and recommendations were discussed                            with the patient's family.                           - The findings and recommendations were discussed                            with the referring physician. Procedure Code(s):        --- Professional ---                           [phone], Endoscopic retrograde                            cholangiopancreatography (ERCP); with removal of                            calculi/debris from biliary/pancreatic duct(s)                           43262, Endoscopic retrograde                            cholangiopancreatography (ERCP); with                            sphincterotomy/papillotomy                           [phone], Endoscopic catheterization of the biliary                            ductal system, radiological supervision and                            interpretation Diagnosis Code(s):        --- Professional ---                           K29.70, Gastritis, unspecified, without bleeding  K80.50, Calculus of bile duct without cholangitis                            or cholecystitis without obstruction                           R10.11, Right upper quadrant pain                           R63.0, Anorexia                           R63.4, Abnormal weight loss                           K83.8, Other specified diseases of biliary tract                           R93.2, Abnormal findings on diagnostic imaging of                            liver and biliary tract CPT copyright 2022 American [employer]. All rights reserved. The codes documented in this report are preliminary and upon coder review may  be revised to meet current compliance requirements. Justice Britain, MD 09/11/2022 8:47:56 AM Number of Addenda: 0

## 2022-09-11 NOTE — Progress Notes (Signed)
Progress Note    Kathleen Valenzuela   BPZ:[id number]  DOB: 1951/01/21  DOA: 08/25/2022     17 PCP: Monico Blitz, MD  Initial CC: AMS  Hospital Course: [name] is a 72 yo female with PMH chronic anemia, anxiety, bipolar 1 disorder, COPD, GERD, heart murmur, HLD, HTN who presented with AMS.  She presented from her SNF to the hospital. She underwent workup with CT, ultrasound, and MRCP.  Findings were consistent with a distended sludge-filled gallbladder with diffuse wall thickening and pericholecystic edema.  She underwent evaluation with GI and general surgery.  She underwent percutaneous tube placement on 08/27/2022. Repeat imaging was performed on 09/04/2022 and showed interval development of a right anterolateral abscess formation and second abscess formation in the right adnexal region.  Interval History:  No events overnight.  Underwent ERCP today and tolerated procedure well.  Assessment and Plan:  Sepsis due to acute cholecystitis - Had extensive workup with CT abdomen/pelvis, HIDA scan and MRCP.  Seen by [facility]/ IR and transferred from [facility] to [facility] - cholecystostomy - performed on 08/27/2022 - S/P ultrasound as well as CT abdomen 12/31 " Interval development of a 4.1 x 1.8 cm right anterolateral abscess formation. Likely second abscess forming measuring 1.9 x 1.6 cm in the region of the right adnexa. Cholecystostomy tube in good position with decompressed gallbladder lumen."  - blood cultures remained negative; Ucx insignificant growth but treated likely by default - has been on cefepime and flagyl since 12/21 and continues to have persistent leukocytosis - given ongoing "confusion" and persistent leukocytosis, I would like to try alternative abx to avoid confounding of encephalopathy from cefepime and see if it helps WBC possibly too -Transition to Zosyn on 09/09/2022 (d/c rocephin/flagyl).  WBC did show some improvement prior to Zosyn transition -WBC has continued to downtrend - repeat  MRCP on 09/09/22 showed choledocholithiasis - ERCP performed on 09/11/2022.  Sludge and stone removed from CBD  Leukocytosis - see above as well - starting to downtrend; continue zosyn for now  [place] discussions - spoke with [name] (son) and [name] (friend) on 09/09/22; explained in detail that she is not a good surgical candidate and may be having ongoing complications from cholecystitis that antibiotics may not be able to treat through but time will tell.  Also concern for her nutritional status as she has not been eating much for several days now and in setting of underlying dementia, she may further decline without adequate nutrition.  Options would be for trial of cortrak placement and starting tube feeds versus PICC placement and starting TPN vs transition to hospice/comfort. I did not recommend PEG placement in setting of her dementia and no benefit to overall QOL in this clinical context. They are open to all options and if prognosis did worsen they understand that hospice/comfort would be reasonable as well -Patient underwent repeat speech eval on 09/10/2022 and diet was able to be advanced to dysphagia level 3.  She does seem to also be ingesting more of this diet, continue documenting percentage of meals -Will continue with remaining as appropriately aggressive as able; cautious optimism in setting of her guarded prognosis; hopeful that diet advancement and ERCP will help her to start recovering some  Acute metabolic encephalopathy -Suspected multifactorial from sepsis and underlying dementia.  Also has been on prolonged cefepime; see reasoning above for antibiotic transition  Dementia with behavioral disturbances Bipolar disorder -Oriented to name and president typically -Continue Namenda and Aricept - Continue Cogentin, Wellbutrin, trazodone, Effexor  Acute hypoxic respiratory failure -resolved - on RA Acute on chronic diastolic CHF - resolved -Now euvolemic Normocytic anemia - Baseline  hemoglobin around 12 to 13 g/dL Thrombocytopenia resolved. Chronic atrial fibrillation continue Eliquis and metoprolol Hypokalemia - replete as needed GERD:Continue PPI Dysphagia: speech input appreciated diet changed to dysphagia 1   Old records reviewed in assessment of this patient  Antimicrobials: Cefepime 08/26/2022 >> 09/07/22 Flagyl 08/26/2022 >> 09/09/22 Rocephin 09/07/21 >> 09/09/22 Zosyn 09/09/22 >> current   DVT prophylaxis:  SCDs Start: 08/26/22 2683   Code Status:   Code Status: Full Code  Mobility Assessment (last 72 hours)     Mobility Assessment     Row Name 09/09/22 1931 09/09/22 1457 09/08/22 1941       Does patient have an order for bedrest or is patient medically unstable -- -- No - Continue assessment     What is the highest level of mobility based on the progressive mobility assessment? Level 3 (Stands with assist) - Balance while standing  and cannot march in place Level 3 (Stands with assist) - Balance while standing  and cannot march in place Level 3 (Stands with assist) - Balance while standing  and cannot march in place     Is the above level different from baseline mobility prior to current illness? -- -- Yes - Recommend PT order              Barriers to discharge:  Disposition Plan:  Pending clinical course  Status is: Inpt  Objective: Blood pressure 125/68, pulse 84, temperature 98.5 F (36.9 C), temperature source Oral, resp. rate 20, height '5\' 7"'$  (1.702 m), weight 78.6 kg, SpO2 97 %.  Examination:  Physical Exam Constitutional:      Comments: Pleasant and demented elderly woman resting in bed in no distress  HENT:     Head: Normocephalic and atraumatic.  Eyes:     Extraocular Movements: Extraocular movements intact.  Cardiovascular:     Rate and Rhythm: Normal rate and regular rhythm.  Pulmonary:     Effort: No respiratory distress.     Breath sounds: Normal breath sounds. No wheezing.  Abdominal:     General: Bowel sounds are normal.  There is no distension.     Palpations: Abdomen is soft.     Tenderness: There is no abdominal tenderness.     Comments: Perc drain in place in RUQ  Musculoskeletal:        General: Normal range of motion.     Cervical back: Normal range of motion and neck supple.  Skin:    General: Skin is warm and dry.  Neurological:     Comments: Oriented only to name and president.  Moves all 4 extremities and follows commands      Consultants:  GI General surgery  Procedures:  08/27/22: PCT to RUQ 09/11/2022: ERCP  Data Reviewed: Results for orders placed or performed during the hospital encounter of 08/25/22 (from the past 24 hour(s))  Glucose, capillary     Status: Abnormal   Collection Time: 09/10/22  8:07 PM  Result Value Ref Range   Glucose-Capillary 135 (H) 70 - 99 mg/dL  CBC with Differential/Platelet     Status: Abnormal   Collection Time: 09/11/22  3:18 AM  Result Value Ref Range   WBC 11.4 (H) 4.0 - 10.5 K/uL   RBC 4.07 3.87 - 5.11 MIL/uL   Hemoglobin 12.2 12.0 - 15.0 g/dL   HCT 37.3 36.0 - 46.0 %  MCV 91.6 80.0 - 100.0 fL   MCH 30.0 26.0 - 34.0 pg   MCHC 32.7 30.0 - 36.0 g/dL   RDW 15.5 11.5 - 15.5 %   Platelets 366 150 - 400 K/uL   nRBC 0.0 0.0 - 0.2 %   Neutrophils Relative % 59 %   Neutro Abs 6.7 1.7 - 7.7 K/uL   Lymphocytes Relative 24 %   Lymphs Abs 2.8 0.7 - 4.0 K/uL   Monocytes Relative 11 %   Monocytes Absolute 1.2 (H) 0.1 - 1.0 K/uL   Eosinophils Relative 4 %   Eosinophils Absolute 0.4 0.0 - 0.5 K/uL   Basophils Relative 1 %   Basophils Absolute 0.1 0.0 - 0.1 K/uL   Immature Granulocytes 1 %   Abs Immature Granulocytes 0.14 (H) 0.00 - 0.07 K/uL  Magnesium     Status: None   Collection Time: 09/11/22  3:18 AM  Result Value Ref Range   Magnesium 2.0 1.7 - 2.4 mg/dL  Basic metabolic panel     Status: Abnormal   Collection Time: 09/11/22  3:18 AM  Result Value Ref Range   Sodium 135 135 - 145 mmol/L   Potassium 3.9 3.5 - 5.1 mmol/L   Chloride 102 98 -  111 mmol/L   CO2 20 (L) 22 - 32 mmol/L   Glucose, Bld 111 (H) 70 - 99 mg/dL   BUN 19 8 - 23 mg/dL   Creatinine, Ser 0.89 0.44 - 1.00 mg/dL   Calcium 8.8 (L) 8.9 - 10.3 mg/dL   GFR, Estimated >60 >60 mL/min   Anion gap 13 5 - 15  Glucose, capillary     Status: Abnormal   Collection Time: 09/11/22  9:41 AM  Result Value Ref Range   Glucose-Capillary 177 (H) 70 - 99 mg/dL  Glucose, capillary     Status: Abnormal   Collection Time: 09/11/22  4:46 PM  Result Value Ref Range   Glucose-Capillary 190 (H) 70 - 99 mg/dL    I have Reviewed nursing notes, Vitals, and Lab results since pt's last encounter. Pertinent lab results : see above I have ordered labwork to follow up on.  I have reviewed the last note from staff over past 24 hours I have discussed pt's care plan and test results with nursing staff, CM/SW, and other staff as appropriate  Time spent: Greater than 50% of the 55 minute visit was spent in counseling/coordination of care for the patient as laid out in the A&P.   LOS: 17 days   Dwyane Dee, MD [facility] 09/11/2022, 5:05 PM

## 2022-09-12 DIAGNOSIS — K8050 Calculus of bile duct without cholangitis or cholecystitis without obstruction: Secondary | ICD-10-CM

## 2022-09-12 DIAGNOSIS — K810 Acute cholecystitis: Secondary | ICD-10-CM | POA: Diagnosis not present

## 2022-09-12 DIAGNOSIS — Z7189 Other specified counseling: Secondary | ICD-10-CM | POA: Diagnosis not present

## 2022-09-12 DIAGNOSIS — A419 Sepsis, unspecified organism: Secondary | ICD-10-CM | POA: Diagnosis not present

## 2022-09-12 DIAGNOSIS — F03918 Unspecified dementia, unspecified severity, with other behavioral disturbance: Secondary | ICD-10-CM | POA: Diagnosis not present

## 2022-09-12 LAB — CBC WITH DIFFERENTIAL/PLATELET
Abs Immature Granulocytes: 0.12 10*3/uL — ABNORMAL HIGH (ref 0.00–0.07)
Basophils Absolute: 0.1 10*3/uL (ref 0.0–0.1)
Basophils Relative: 0 %
Eosinophils Absolute: 0 10*3/uL (ref 0.0–0.5)
Eosinophils Relative: 0 %
HCT: 35.6 % — ABNORMAL LOW (ref 36.0–46.0)
Hemoglobin: 11.5 g/dL — ABNORMAL LOW (ref 12.0–15.0)
Immature Granulocytes: 1 %
Lymphocytes Relative: 10 %
Lymphs Abs: 1.8 10*3/uL (ref 0.7–4.0)
MCH: 29.8 pg (ref 26.0–34.0)
MCHC: 32.3 g/dL (ref 30.0–36.0)
MCV: 92.2 fL (ref 80.0–100.0)
Monocytes Absolute: 1 10*3/uL (ref 0.1–1.0)
Monocytes Relative: 6 %
Neutro Abs: 14.4 10*3/uL — ABNORMAL HIGH (ref 1.7–7.7)
Neutrophils Relative %: 83 %
Platelets: 394 10*3/uL (ref 150–400)
RBC: 3.86 MIL/uL — ABNORMAL LOW (ref 3.87–5.11)
RDW: 15 % (ref 11.5–15.5)
WBC: 17.3 10*3/uL — ABNORMAL HIGH (ref 4.0–10.5)
nRBC: 0 % (ref 0.0–0.2)

## 2022-09-12 LAB — BASIC METABOLIC PANEL
Anion gap: 10 (ref 5–15)
BUN: 15 mg/dL (ref 8–23)
CO2: 22 mmol/L (ref 22–32)
Calcium: 9.1 mg/dL (ref 8.9–10.3)
Chloride: 106 mmol/L (ref 98–111)
Creatinine, Ser: 0.84 mg/dL (ref 0.44–1.00)
GFR, Estimated: >60 mL/min (ref >60)
Glucose, Bld: 141 mg/dL — ABNORMAL HIGH (ref 70–99)
Potassium: 4.4 mmol/L (ref 3.5–5.1)
Sodium: 138 mmol/L (ref 135–145)

## 2022-09-12 LAB — GLUCOSE, CAPILLARY
Glucose-Capillary: 148 mg/dL — ABNORMAL HIGH (ref 70–99)
Glucose-Capillary: 129 mg/dL — ABNORMAL HIGH (ref 70–99)

## 2022-09-12 LAB — MAGNESIUM: Magnesium: 2 mg/dL (ref 1.7–2.4)

## 2022-09-12 NOTE — Progress Notes (Addendum)
Daily Rounding Note  09/12/2022, 11:04 AM  LOS: 18 days   SUBJECTIVE:   Chief complaint:  choledocholithiasis    Minor if any upper abd pain.  Tolerating soft diet wo N/V.  Brown stools Uotput from perc tube: 45 mL yesterday.    OBJECTIVE:         Vital signs in last 24 hours:    Temp:  [97.7 F (36.5 C)-98.5 F (36.9 C)] 98.4 F (36.9 C) (01/08 0756) Pulse Rate:  [74-95] 74 (01/08 0756) Resp:  [14-20] 18 (01/08 0756) BP: (122-128)/(52-73) 128/52 (01/08 0756) SpO2:  [95 %-97 %] 95 % (01/08 0756) Last BM Date : 09/11/22 Filed Weights   09/03/22 0500 09/08/22 0500 09/11/22 0720  Weight: 78.6 kg 78.6 kg 78.6 kg   General: NAD.  Alert.  Less anxious than when seen last week   Heart: RRR Chest: no dyspnea or cough Abdomen: soft, minor non-focal tenderness upper/mid abdomen bi.  Active BS.  No distention.  Strait drain from [employer] tube is empty.   Extremities: no CCE Neuro/Psych:  appropriate, not confused.  Fluid speech.  More relaxed than last week.    Intake/Output from previous day: 01/07 0701 - 01/08 0700 In: 1563 [P.O.:948; I.V.:605] Out: 45 [Drains:45]  Intake/Output this shift: Total I/O In: 240 [P.O.:240] Out: 600 [Urine:600]  Lab Results: Recent Labs    09/10/22 0359 09/11/22 0318 09/12/22 0358  WBC 14.0* 11.4* 17.3*  HGB 13.2 12.2 11.5*  HCT 39.4 37.3 35.6*  PLT 383 366 394   BMET Recent Labs    09/10/22 0359 09/11/22 0318 09/12/22 0358  NA 135 135 138  K 4.3 3.9 4.4  CL 104 102 106  CO2 23 20* 22  GLUCOSE 112* 111* 141*  BUN '18 19 15  '$ CREATININE 0.89 0.89 0.84  CALCIUM 9.2 8.8* 9.1   LFT Recent Labs    09/10/22 0350  PROT 7.6  ALBUMIN 2.9*  AST 24  ALT 17  ALKPHOS 71  BILITOT 0.5  BILIDIR <0.1  IBILI NOT CALCULATED   PT/INR No results for input(s): "LABPROT", "INR" in the last 72 hours. Hepatitis Panel No results for input(s): "HEPBSAG", "HCVAB", "HEPAIGM",  "HEPBIGM" in the last 72 hours.  Studies/Results: DG ERCP  Result Date: 09/11/2022 CLINICAL DATA:  Biliary duct dilation. EXAM: ERCP COMPARISON:  CT [facility], 09/04/2022. MRCP, 09/09/2022. IR fluoroscopy, 09/27/2021. FLUOROSCOPY: Exposure Index (as provided by the fluoroscopic device): 22.8 mGy Kerma FINDINGS: Multiple, limited oblique planar images of the RIGHT upper quadrant obtained C-arm. Images demonstrating flexible endoscopy, biliary duct cannulation, sphincterotomy, retrograde cholangiogram and balloon sweep. Extrahepatic biliary ductal dilation is present. No evidence of biliary filling defect is clearly demonstrated. A cholecystostomy tube is present. IMPRESSION: Fluoroscopic imaging for ERCP. Extrahepatic biliary ductal dilation without overt distal CBD filling defect. For complete description of intra procedural findings, please see performing service dictation. Electronically Signed   By: Michaelle Birks M.D.   On: 09/11/2022 11:34    Scheduled Meds:  atorvastatin  40 mg Oral QHS   benztropine  0.5 mg Oral BID   buPROPion  150 mg Oral q morning   Chlorhexidine Gluconate Cloth  6 each Topical Daily   donepezil  10 mg Oral Q2000   feeding supplement  237 mL Oral TID WC   fluticasone  1 spray Each Nare Daily   fluticasone furoate-vilanterol  1 puff Inhalation Daily   gabapentin  300 mg Oral TID   isosorbide mononitrate  15 mg Oral Daily   lurasidone  80 mg Oral QHS   memantine  10 mg Oral BID   metoprolol succinate  25 mg Oral Daily   multivitamin with minerals  1 tablet Oral Daily   pantoprazole  40 mg Oral BID AC   sodium chloride flush  10-40 mL Intracatheter Q12H   sodium chloride flush  5 mL Intracatheter Q8H   tiZANidine  2 mg Oral BID   traZODone  100 mg Oral QHS   venlafaxine XR  300 mg Oral Daily   Continuous Infusions:  piperacillin-tazobactam (ZOSYN)  IV 3.375 g (09/12/22 0632)   PRN Meds:.albuterol, ALPRAZolam, diphenhydrAMINE, EPINEPHrine, guaiFENesin, hydrOXYzine,  iohexol, loperamide, metoprolol tartrate, [DISCONTINUED] ondansetron **OR** ondansetron (ZOFRAN) IV, mouth rinse, oxyCODONE, sodium chloride flush   ASSESMENT:     Choledocholithiasis.   09/11/22 ERCP w CBD stone/sludge.  Non filling cystic duct.  Treated w sphincterotonomy, ballon sweep.  Gastritis noted, bxd for H Pylori.  Protonix 40 po bid in place.      Cholecystitis.  Perc chole tube 12/23.  IR not actively following.  Last surgical note wa 1/4.  Continues on zosyn, previous flagyl and cefepime.        Chronic eliquis for Afib.  .   On hold.  Ok to restart 1/9     PLAN      Needs IR fup re per chole tube.  ? Can this be removed prior to dc?Marland Kitchen  Also seek surgical opinion re duration of abx    Gi signing off.  Will review UGI path report when available.  Leave Protonix 40 mg po bid in place.          Azucena Freed  09/12/2022, 11:04 AM Phone [phone]    Attending physician's note   I have taken history, reviewed the chart and examined the patient. I performed a substantive portion of this encounter, including complete performance of at least one of the key components, in conjunction with the APP. I agree with the Advanced Practitioner's note, impression and recommendations.   Choledocholithiasis s/p ERCP with sphincterotomy/stone extraction 1/7 Acute cholecystitis s/p percutaneous cholecystostomy 12/23 A-fib on Eliquis  Plan: -Advance diet -Lap chole per Sx -Cholecystostomy tube care per IR -Resume Eliquis 1/9 -Will sign off for now.   Carmell Austria, MD [facility] [phone]

## 2022-09-12 NOTE — Progress Notes (Signed)
Mobility Specialist Progress Note   09/12/22 1123  Mobility  Activity Ambulated with assistance in room  Level of Assistance Contact guard assist, steadying assist  Assistive Device Front wheel walker  Distance Ambulated (ft) 60 ft  Activity Response Tolerated well  Mobility Referral Yes  $Mobility charge 1 Mobility   Pre Mobility: 71 HR, 106/91 BP, 95% SpO2 on RA During Mobility: 87 HR, 86% SpO2 on RA Post Mobility: 66 HR, 131/55 BP, 90% SpO2 on RA  Received pt in bed asleep but easily aroused and agreeable. No physical assist to EOB or to stand but CGA during ambulation for safety. Ambulation shortened d/t fatigue and desaturation, SpO2 floating in mid to high 80's but pt able to make it back to room w/o fault. Left w/ call bell in reach and bed alarm on.    Holland Falling Mobility Specialist Please contact via SecureChat or  Rehab office at [phone]

## 2022-09-12 NOTE — Plan of Care (Signed)
  Problem: Activity: Goal: Risk for activity intolerance will decrease Outcome: Progressing   Problem: Nutrition: Goal: Adequate nutrition will be maintained Outcome: Progressing   Problem: Coping: Goal: Level of anxiety will decrease Outcome: Progressing   

## 2022-09-12 NOTE — Progress Notes (Signed)
Progress Note    Kathleen Valenzuela   QZE:[id number]  DOB: [birth year]  DOA: 08/25/2022     18 PCP: Monico Blitz, MD  Initial CC: AMS  Hospital Course: Ms. Truax is a 72 yo female with PMH chronic anemia, anxiety, bipolar 1 disorder, COPD, GERD, heart murmur, HLD, HTN who presented with AMS.  She presented from her SNF to the hospital. She underwent workup with CT, ultrasound, and MRCP.  Findings were consistent with a distended sludge-filled gallbladder with diffuse wall thickening and pericholecystic edema.  She underwent evaluation with GI and general surgery.  She underwent percutaneous tube placement on 08/27/2022. Repeat imaging was performed on 09/04/2022 and showed interval development of a right anterolateral abscess formation and second abscess formation in the right adnexal region.  Interval History:  No events overnight.  Underwent ERCP yesterday and tolerated procedure well. Appears overall stable today and has been eating more food since diet advanced.   Assessment and Plan:  Sepsis due to acute cholecystitis - Had extensive workup with CT abdomen/pelvis, HIDA scan and MRCP.  Seen by [facility]/ IR and [facility] - cholecystostomy - performed on 08/27/2022 - S/P ultrasound as well as CT abdomen 12/31 " Interval development of a 4.1 x 1.8 cm right anterolateral abscess formation. Likely second abscess forming measuring 1.9 x 1.6 cm in the region of the right adnexa. Cholecystostomy tube in good position with decompressed gallbladder lumen."  - blood cultures remained negative; Ucx insignificant growth but treated likely by default - has been on cefepime and flagyl since 12/21 and continues to have persistent leukocytosis - given ongoing "confusion" and persistent leukocytosis, I would like to try alternative abx to avoid confounding of encephalopathy from cefepime and see if it helps WBC possibly too -Transition to Zosyn on 09/09/2022 (d/c rocephin/flagyl).  WBC did show  some improvement prior to Zosyn transition -WBC has continued to downtrend (increase after ERCP considered reactive) - repeat MRCP on 09/09/22 showed choledocholithiasis - ERCP performed on 09/11/2022.  Sludge and stone removed from CBD  Leukocytosis - see above as well -Has been downtrending the past few days.  Elevation today suspected possibly reactive after procedure yesterday - Repeat CBC tomorrow to continue trending - Continue Zosyn  GOC discussions - spoke with [name] (son) and [name] (friend) on 09/09/22; explained in detail that she is not a good surgical candidate and may be having ongoing complications from cholecystitis that antibiotics may not be able to treat through but time will tell.  Also concern for her nutritional status as she has not been eating much for several days now and in setting of underlying dementia, she may further decline without adequate nutrition.  Options would be for trial of cortrak placement and starting tube feeds versus PICC placement and starting TPN vs transition to hospice/comfort. I did not recommend PEG placement in setting of her dementia and no benefit to overall QOL in this clinical context. They are open to all options and if prognosis did worsen they understand that hospice/comfort would be reasonable as well -Patient underwent repeat speech eval on 09/10/2022 and diet was able to be advanced to dysphagia level 3.  She does seem to also be ingesting more of this diet, continue documenting percentage of meals -Will continue with remaining as appropriately aggressive as able; cautious optimism in setting of her guarded prognosis; hopeful that diet advancement and ERCP will help her to start recovering some  Acute metabolic encephalopathy -Suspected multifactorial from sepsis and underlying  dementia.  Also has been on prolonged cefepime; see reasoning above for antibiotic transition  Dementia with behavioral disturbances Bipolar disorder -Oriented to name  and president typically -Continue Namenda and Aricept - Continue Cogentin, Wellbutrin, trazodone, Effexor  Acute hypoxic respiratory failure -resolved - on RA Acute on chronic diastolic CHF - resolved -Now euvolemic Normocytic anemia - Baseline hemoglobin around 12 to 13 g/dL Thrombocytopenia resolved. Chronic atrial fibrillation continue Eliquis and metoprolol Hypokalemia - replete as needed GERD:Continue PPI Dysphagia: speech input appreciated diet changed to dysphagia 1   Old records reviewed in assessment of this patient  Antimicrobials: Cefepime 08/26/2022 >> 09/07/22 Flagyl 08/26/2022 >> 09/09/22 Rocephin 09/07/21 >> 09/09/22 Zosyn 09/09/22 >> current   DVT prophylaxis:  SCDs Start: 08/26/22 5188   Code Status:   Code Status: Full Code  Mobility Assessment (last 72 hours)     Mobility Assessment     Row Name 09/12/22 0900 09/11/22 2104 09/09/22 1931       Does patient have an order for bedrest or is patient medically unstable No - Continue assessment No - Continue assessment --     What is the highest level of mobility based on the progressive mobility assessment? Level 5 (Walks with assist in room/hall) - Balance while stepping forward/back and can walk in room with assist - Complete Level 3 (Stands with assist) - Balance while standing  and cannot march in place Level 3 (Stands with assist) - Balance while standing  and cannot march in place     Is the above level different from baseline mobility prior to current illness? -- Yes - Recommend PT order --              Barriers to discharge:  Disposition Plan:  Pending clinical course  Status is: Inpt  Objective: Blood pressure (!) 128/52, pulse 74, temperature 98.4 F (36.9 C), temperature source Oral, resp. rate 18, height '5\' 7"'$  (1.702 m), weight 78.6 kg, SpO2 95 %.  Examination:  Physical Exam Constitutional:      Comments: Pleasant and demented elderly woman resting in bed in no distress  HENT:     Head:  Normocephalic and atraumatic.  Eyes:     Extraocular Movements: Extraocular movements intact.  Cardiovascular:     Rate and Rhythm: Normal rate and regular rhythm.  Pulmonary:     Effort: No respiratory distress.     Breath sounds: Normal breath sounds. No wheezing.  Abdominal:     General: Bowel sounds are normal. There is no distension.     Palpations: Abdomen is soft.     Tenderness: There is no abdominal tenderness.     Comments: Perc drain in place in RUQ  Musculoskeletal:        General: Normal range of motion.     Cervical back: Normal range of motion and neck supple.  Skin:    General: Skin is warm and dry.  Neurological:     Comments: Oriented only to name and president.  Moves all 4 extremities and follows commands      Consultants:  GI General surgery  Procedures:  08/27/22: PCT to RUQ 09/11/2022: ERCP  Data Reviewed: Results for orders placed or performed during the hospital encounter of 08/25/22 (from the past 24 hour(s))  Glucose, capillary     Status: Abnormal   Collection Time: 09/11/22  4:46 PM  Result Value Ref Range   Glucose-Capillary 190 (H) 70 - 99 mg/dL  Basic metabolic panel     Status: Abnormal  Collection Time: 09/12/22  3:58 AM  Result Value Ref Range   Sodium 138 135 - 145 mmol/L   Potassium 4.4 3.5 - 5.1 mmol/L   Chloride 106 98 - 111 mmol/L   CO2 22 22 - 32 mmol/L   Glucose, Bld 141 (H) 70 - 99 mg/dL   BUN 15 8 - 23 mg/dL   Creatinine, Ser 0.84 0.44 - 1.00 mg/dL   Calcium 9.1 8.9 - 10.3 mg/dL   GFR, Estimated >60 >60 mL/min   Anion gap 10 5 - 15  CBC with Differential/Platelet     Status: Abnormal   Collection Time: 09/12/22  3:58 AM  Result Value Ref Range   WBC 17.3 (H) 4.0 - 10.5 K/uL   RBC 3.86 (L) 3.87 - 5.11 MIL/uL   Hemoglobin 11.5 (L) 12.0 - 15.0 g/dL   HCT 35.6 (L) 36.0 - 46.0 %   MCV 92.2 80.0 - 100.0 fL   MCH 29.8 26.0 - 34.0 pg   MCHC 32.3 30.0 - 36.0 g/dL   RDW 15.0 11.5 - 15.5 %   Platelets 394 150 - 400 K/uL    nRBC 0.0 0.0 - 0.2 %   Neutrophils Relative % 83 %   Neutro Abs 14.4 (H) 1.7 - 7.7 K/uL   Lymphocytes Relative 10 %   Lymphs Abs 1.8 0.7 - 4.0 K/uL   Monocytes Relative 6 %   Monocytes Absolute 1.0 0.1 - 1.0 K/uL   Eosinophils Relative 0 %   Eosinophils Absolute 0.0 0.0 - 0.5 K/uL   Basophils Relative 0 %   Basophils Absolute 0.1 0.0 - 0.1 K/uL   Immature Granulocytes 1 %   Abs Immature Granulocytes 0.12 (H) 0.00 - 0.07 K/uL  Magnesium     Status: None   Collection Time: 09/12/22  3:58 AM  Result Value Ref Range   Magnesium 2.0 1.7 - 2.4 mg/dL  Glucose, capillary     Status: Abnormal   Collection Time: 09/12/22  7:56 AM  Result Value Ref Range   Glucose-Capillary 148 (H) 70 - 99 mg/dL  Glucose, capillary     Status: Abnormal   Collection Time: 09/12/22 11:23 AM  Result Value Ref Range   Glucose-Capillary 129 (H) 70 - 99 mg/dL    I have Reviewed nursing notes, Vitals, and Lab results since pt's last encounter. Pertinent lab results : see above I have ordered labwork to follow up on.  I have reviewed the last note from staff over past 24 hours I have discussed pt's care plan and test results with nursing staff, CM/SW, and other staff as appropriate  Time spent: Greater than 50% of the 55 minute visit was spent in counseling/coordination of care for the patient as laid out in the A&P.   LOS: 18 days   Dwyane Dee, MD [facility] 09/12/2022, 4:26 PM

## 2022-09-13 ENCOUNTER — Inpatient Hospital Stay
Admission: RE | Admit: 2022-09-13 | Discharge: 2022-09-13 | Disposition: A | Discharge status: Home / Self Care | Payer: Medicare Other | Source: Ambulatory Visit | Attending: Internal Medicine | Admitting: Internal Medicine

## 2022-09-13 DIAGNOSIS — K810 Acute cholecystitis: Secondary | ICD-10-CM | POA: Diagnosis not present

## 2022-09-13 DIAGNOSIS — F03918 Unspecified dementia, unspecified severity, with other behavioral disturbance: Secondary | ICD-10-CM | POA: Diagnosis not present

## 2022-09-13 DIAGNOSIS — A419 Sepsis, unspecified organism: Secondary | ICD-10-CM | POA: Diagnosis not present

## 2022-09-13 DIAGNOSIS — Z7189 Other specified counseling: Secondary | ICD-10-CM | POA: Diagnosis not present

## 2022-09-13 LAB — BASIC METABOLIC PANEL
Anion gap: 9 (ref 5–15)
BUN: 13 mg/dL (ref 8–23)
CO2: 24 mmol/L (ref 22–32)
Calcium: 9 mg/dL (ref 8.9–10.3)
Chloride: 104 mmol/L (ref 98–111)
Creatinine, Ser: 0.84 mg/dL (ref 0.44–1.00)
GFR, Estimated: >60 mL/min (ref >60)
Glucose, Bld: 89 mg/dL (ref 70–99)
Potassium: 3.9 mmol/L (ref 3.5–5.1)
Sodium: 137 mmol/L (ref 135–145)

## 2022-09-13 LAB — SURGICAL PATHOLOGY

## 2022-09-13 LAB — CBC WITH DIFFERENTIAL/PLATELET
Abs Immature Granulocytes: 0.07 10*3/uL (ref 0.00–0.07)
Basophils Absolute: 0.1 10*3/uL (ref 0.0–0.1)
Basophils Relative: 1 %
Eosinophils Absolute: 0.2 10*3/uL (ref 0.0–0.5)
Eosinophils Relative: 2 %
HCT: 36.7 % (ref 36.0–46.0)
Hemoglobin: 12.2 g/dL (ref 12.0–15.0)
Immature Granulocytes: 1 %
Lymphocytes Relative: 27 %
Lymphs Abs: 3.1 10*3/uL (ref 0.7–4.0)
MCH: 30.7 pg (ref 26.0–34.0)
MCHC: 33.2 g/dL (ref 30.0–36.0)
MCV: 92.2 fL (ref 80.0–100.0)
Monocytes Absolute: 1.1 10*3/uL — ABNORMAL HIGH (ref 0.1–1.0)
Monocytes Relative: 10 %
Neutro Abs: 6.9 10*3/uL (ref 1.7–7.7)
Neutrophils Relative %: 59 %
Platelets: 338 10*3/uL (ref 150–400)
RBC: 3.98 MIL/uL (ref 3.87–5.11)
RDW: 15.3 % (ref 11.5–15.5)
WBC: 11.6 10*3/uL — ABNORMAL HIGH (ref 4.0–10.5)
nRBC: 0 % (ref 0.0–0.2)

## 2022-09-13 LAB — GLUCOSE, CAPILLARY
Glucose-Capillary: 130 mg/dL — ABNORMAL HIGH (ref 70–99)
Glucose-Capillary: 101 mg/dL — ABNORMAL HIGH (ref 70–99)
Glucose-Capillary: 144 mg/dL — ABNORMAL HIGH (ref 70–99)
Glucose-Capillary: 135 mg/dL — ABNORMAL HIGH (ref 70–99)

## 2022-09-13 LAB — MAGNESIUM: Magnesium: 1.9 mg/dL (ref 1.7–2.4)

## 2022-09-13 MED ORDER — AMOXICILLIN-POT CLAVULANATE 875-125 MG PO TABS
1.0000 | ORAL_TABLET | Freq: Two times a day (BID) | ORAL | Status: DC
  Administered 2022-09-13 – 2022-09-16 (×7): 1 via ORAL
  Filled 2022-09-13 (×7): qty 1

## 2022-09-13 MED ORDER — ACETAMINOPHEN 325 MG PO TABS
650.0000 mg | ORAL_TABLET | Freq: Four times a day (QID) | ORAL | Status: DC
  Administered 2022-09-13 – 2022-09-16 (×12): 650 mg via ORAL
  Filled 2022-09-13 (×13): qty 2

## 2022-09-13 NOTE — Plan of Care (Signed)
  Problem: Nutrition: Goal: Adequate nutrition will be maintained Outcome: Progressing   Problem: Coping: Goal: Level of anxiety will decrease Outcome: Progressing   Problem: Pain Managment: Goal: General experience of comfort will improve Outcome: Progressing   

## 2022-09-13 NOTE — Progress Notes (Signed)
Mobility Specialist Progress Note   09/13/22 1200  Mobility  Activity Ambulated with assistance in room  Level of Assistance Contact guard assist, steadying assist  Assistive Device Front wheel walker  Distance Ambulated (ft) 104 ft (52+52)  Activity Response Tolerated well  Mobility Referral Yes  $Mobility charge 1 Mobility   Pre Mobility: 78 HR During Mobility: 89 HR Post Mobility: 64 HR  Received pt in bed on 2LO2, alert and c/o slight RLQ discomfort but agreeable. Able to get EOB w/ minA for UE support. CGA to stand and ambulate in room on RA. X1 seated rest break d/t SOB, SpO2 reading 92% but pt placed back on 2LO2  and kept on per advisement of RN and for comfort. Able to ambulate in room for one more bout, returned to bed w/o fault or complaint. Call bell placed in reach and bed alarm on   [place] Specialist Please contact via [place] or  Rehab office at [phone]

## 2022-09-13 NOTE — Plan of Care (Signed)
  Problem: Activity: Goal: Risk for activity intolerance will decrease Outcome: Progressing   Problem: Nutrition: Goal: Adequate nutrition will be maintained Outcome: Progressing   Problem: Coping: Goal: Level of anxiety will decrease Outcome: Progressing   Problem: Pain Managment: Goal: General experience of comfort will improve Outcome: Progressing   

## 2022-09-13 NOTE — Progress Notes (Signed)
Progress Note    Kathleen Valenzuela   ZCH:[id number]  DOB: [birth year]  DOA: 08/25/2022     19 PCP: Monico Blitz, MD  Initial CC: AMS  Hospital Course: Ms. Oguinn is a 72 yo female with PMH chronic anemia, anxiety, bipolar 1 disorder, COPD, GERD, heart murmur, HLD, HTN who presented with AMS.  She presented from her SNF to the hospital. She underwent workup with CT, ultrasound, and MRCP.  Findings were consistent with a distended sludge-filled gallbladder with diffuse wall thickening and pericholecystic edema.  She underwent evaluation with GI and general surgery.  She underwent percutaneous tube placement on 08/27/2022. Repeat imaging was performed on 09/04/2022 and showed interval development of a right anterolateral abscess formation and second abscess formation in the right adnexal region.  Interval History:  No events overnight.  Steadily improving since ERCP.  Eating better after diet change as well. Called and spoke with [name] and [place] this afternoon.  Discussed she would probably be ready for going to rehab on Thursday.  Assessment and Plan:  Sepsis due to acute cholecystitis - Had extensive workup with CT abdomen/pelvis, HIDA scan and MRCP.  Seen by [facility]/ IR and transferred [facility] - cholecystostomy - performed on 08/27/2022 - S/P ultrasound as well as CT abdomen 12/31 " Interval development of a 4.1 x 1.8 cm right anterolateral abscess formation. Likely second abscess forming measuring 1.9 x 1.6 cm in the region of the right adnexa. Cholecystostomy tube in good position with decompressed gallbladder lumen."  - blood cultures remained negative; Ucx insignificant growth but treated likely by default - has been on cefepime and flagyl since 12/21 and continues to have persistent leukocytosis - given ongoing "confusion" and persistent leukocytosis, I would like to try alternative abx to avoid confounding of encephalopathy from cefepime and see if it helps WBC possibly too -Transition  to Zosyn on 09/09/2022 (d/c rocephin/flagyl).  WBC did show some improvement prior to Zosyn transition and has continued to improve (transient elevation s/p ERCP then coming down again) - therefore will finish 10 days course of Zosyn >> Augmentin (5 days left on Augmentin, stop date 09/17/22) - repeat MRCP on 09/09/22 showed choledocholithiasis - ERCP performed on 09/11/2022.  Sludge and stone removed from CBD  Leukocytosis - see above as well -Overall downtrending - Complete course with Augmentin, see above  GOC discussions - spoke with [name] (son) and [name] (friend) on 09/09/22; explained in detail that she is not a good surgical candidate and may be having ongoing complications from cholecystitis that antibiotics may not be able to treat through but time will tell.  Also concern for her nutritional status as she has not been eating much for several days now and in setting of underlying dementia, she may further decline without adequate nutrition.  Options would be for trial of cortrak placement and starting tube feeds versus PICC placement and starting TPN vs transition to hospice/comfort. I did not recommend PEG placement in setting of her dementia and no benefit to overall QOL in this clinical context. They are open to all options and if prognosis did worsen they understand that hospice/comfort would be reasonable as well -Patient underwent repeat speech eval on 09/10/2022 and diet was able to be advanced to dysphagia level 3.  She does seem to also be ingesting more of this diet, continue documenting percentage of meals; seems to be doing better -Will continue with remaining as appropriately aggressive as able; cautious optimism in setting of her guarded prognosis;  hopeful that diet advancement and ERCP will help her to start recovering some -Has improved and suspect she should be able to plan on discharging to rehab on Thursday  Acute metabolic encephalopathy -resolved -Suspected multifactorial from  sepsis and underlying dementia.  Also has been on prolonged cefepime; see reasoning above for antibiotic transition  Dementia with behavioral disturbances Bipolar disorder -Oriented to name and president typically -Continue Namenda and Aricept - Continue Cogentin, Wellbutrin, trazodone, Effexor -Now back to normal baseline  Acute hypoxic respiratory failure -resolved - on RA Acute on chronic diastolic CHF - resolved -Now euvolemic Normocytic anemia - Baseline hemoglobin around 12 to 13 g/dL Thrombocytopenia resolved. Chronic atrial fibrillation continue Eliquis and metoprolol Hypokalemia - replete as needed GERD:Continue PPI Dysphagia: speech input appreciated diet changed to dysphagia 1   Old records reviewed in assessment of this patient  Antimicrobials: Cefepime 08/26/2022 >> 09/07/22 Flagyl 08/26/2022 >> 09/09/22 Rocephin 09/07/21 >> 09/09/22 Zosyn 09/09/22 >> 09/13/22 Augmentin 09/13/22 >> current    DVT prophylaxis:  SCDs Start: 08/26/22 4268   Code Status:   Code Status: Full Code  Mobility Assessment (last 72 hours)     Mobility Assessment     Row Name 09/13/22 0900 09/12/22 2037 09/12/22 0900 09/11/22 2104     Does patient have an order for bedrest or is patient medically unstable No - Continue assessment No - Continue assessment No - Continue assessment No - Continue assessment    What is the highest level of mobility based on the progressive mobility assessment? Level 3 (Stands with assist) - Balance while standing  and cannot march in place Level 3 (Stands with assist) - Balance while standing  and cannot march in place Level 5 (Walks with assist in room/hall) - Balance while stepping forward/back and can walk in room with assist - Complete Level 3 (Stands with assist) - Balance while standing  and cannot march in place    Is the above level different from baseline mobility prior to current illness? Yes - Recommend PT order Yes - Recommend PT order -- Yes - Recommend PT order              Barriers to discharge:  Disposition Plan:  Pending clinical course  Status is: Inpt  Objective: Blood pressure 132/61, pulse 75, temperature 99 F (37.2 C), temperature source Oral, resp. rate 18, height '5\' 7"'$  (1.702 m), weight 78.6 kg, SpO2 (!) 89 %.  Examination:  Physical Exam Constitutional:      Comments: Pleasant and demented elderly woman resting in bed in no distress  HENT:     Head: Normocephalic and atraumatic.  Eyes:     Extraocular Movements: Extraocular movements intact.  Cardiovascular:     Rate and Rhythm: Normal rate and regular rhythm.  Pulmonary:     Effort: No respiratory distress.     Breath sounds: Normal breath sounds. No wheezing.  Abdominal:     General: Bowel sounds are normal. There is no distension.     Palpations: Abdomen is soft.     Tenderness: There is no abdominal tenderness.     Comments: Perc drain in place in RUQ  Musculoskeletal:        General: Normal range of motion.     Cervical back: Normal range of motion and neck supple.  Skin:    General: Skin is warm and dry.  Neurological:     Comments: Oriented only to name and president.  Moves all 4 extremities and follows commands  Consultants:  GI General surgery  Procedures:  08/27/22: PCT to RUQ 09/11/2022: ERCP  Data Reviewed: Results for orders placed or performed during the hospital encounter of 08/25/22 (from the past 24 hour(s))  Glucose, capillary     Status: Abnormal   Collection Time: 09/12/22  4:11 PM  Result Value Ref Range   Glucose-Capillary 130 (H) 70 - 99 mg/dL  Basic metabolic panel     Status: None   Collection Time: 09/13/22  4:03 AM  Result Value Ref Range   Sodium 137 135 - 145 mmol/L   Potassium 3.9 3.5 - 5.1 mmol/L   Chloride 104 98 - 111 mmol/L   CO2 24 22 - 32 mmol/L   Glucose, Bld 89 70 - 99 mg/dL   BUN 13 8 - 23 mg/dL   Creatinine, Ser 0.84 0.44 - 1.00 mg/dL   Calcium 9.0 8.9 - 10.3 mg/dL   GFR, Estimated >60 >60 mL/min    Anion gap 9 5 - 15  CBC with Differential/Platelet     Status: Abnormal   Collection Time: 09/13/22  4:03 AM  Result Value Ref Range   WBC 11.6 (H) 4.0 - 10.5 K/uL   RBC 3.98 3.87 - 5.11 MIL/uL   Hemoglobin 12.2 12.0 - 15.0 g/dL   HCT 36.7 36.0 - 46.0 %   MCV 92.2 80.0 - 100.0 fL   MCH 30.7 26.0 - 34.0 pg   MCHC 33.2 30.0 - 36.0 g/dL   RDW 15.3 11.5 - 15.5 %   Platelets 338 150 - 400 K/uL   nRBC 0.0 0.0 - 0.2 %   Neutrophils Relative % 59 %   Neutro Abs 6.9 1.7 - 7.7 K/uL   Lymphocytes Relative 27 %   Lymphs Abs 3.1 0.7 - 4.0 K/uL   Monocytes Relative 10 %   Monocytes Absolute 1.1 (H) 0.1 - 1.0 K/uL   Eosinophils Relative 2 %   Eosinophils Absolute 0.2 0.0 - 0.5 K/uL   Basophils Relative 1 %   Basophils Absolute 0.1 0.0 - 0.1 K/uL   Immature Granulocytes 1 %   Abs Immature Granulocytes 0.07 0.00 - 0.07 K/uL  Magnesium     Status: None   Collection Time: 09/13/22  4:03 AM  Result Value Ref Range   Magnesium 1.9 1.7 - 2.4 mg/dL  Glucose, capillary     Status: Abnormal   Collection Time: 09/13/22  7:41 AM  Result Value Ref Range   Glucose-Capillary 101 (H) 70 - 99 mg/dL  Glucose, capillary     Status: Abnormal   Collection Time: 09/13/22 11:08 AM  Result Value Ref Range   Glucose-Capillary 144 (H) 70 - 99 mg/dL  Glucose, capillary     Status: Abnormal   Collection Time: 09/13/22  3:59 PM  Result Value Ref Range   Glucose-Capillary 135 (H) 70 - 99 mg/dL    I have Reviewed nursing notes, Vitals, and Lab results since pt's last encounter. Pertinent lab results : see above I have ordered labwork to follow up on.  I have reviewed the last note from staff over past 24 hours I have discussed pt's care plan and test results with nursing staff, CM/SW, and other staff as appropriate  Time spent: Greater than 50% of the 55 minute visit was spent in counseling/coordination of care for the patient as laid out in the A&P.   LOS: 19 days   Dwyane Dee, MD [facility] 09/13/2022, 4:03 PM

## 2022-09-13 NOTE — Progress Notes (Signed)
Physical Therapy Treatment Patient Details Name: Kathleen Valenzuela MRN: [id number] DOB: 08/19/1951 Today's Date: 09/13/2022   History of Present Illness Pt is a 72 yo female admitted from SNF on 08/25/22 with AMS; workup for acute cholycystitis, sepsis. Worsening respiratory status 12/25 requiring BiPAP; rapid response 12/26 for respiratory status and BiPAP intolerance, transfer to ICU. PMH includes dementia, anemia, anxiety, bipolar 1 disorder, COPD, GERD, heart murmur, HLD, HTN.    PT Comments    Pt received in supine, agreeable to therapy session and with good participation and tolerance for bed mobility and transfer/gait training with RW. Pt reports some anxiety surrounding gait but agreeable with encouragement and needing up to minA for stability and RW management. Pt with poor awareness of lines and impulsive, tele sitter present for pt safety and chair alarm on at end of session while pt sitting up in recliner. Pt continues to benefit from PT services to progress toward functional mobility goals. Pt due for goal update next session, she is making good progress toward goals able to perform gait trial short household distances currently with RW.   Recommendations for follow up therapy are one component of a multi-disciplinary discharge planning process, led by the attending physician.  Recommendations may be updated based on patient status, additional functional criteria and insurance authorization.  Follow Up Recommendations  Skilled nursing-short term rehab (<3 hours/day) Can patient physically be transported by private vehicle: No (possibly, depending on cognition)   Assistance Recommended at Discharge Frequent or constant Supervision/Assistance  Patient can return home with the following A lot of help with walking and/or transfers;A lot of help with bathing/dressing/bathroom;Direct supervision/assist for medications management;Direct supervision/assist for financial management;Assist for  transportation;Help with stairs or ramp for entrance;Assistance with cooking/housework (may need supervision for meals due to dementia)   Equipment Recommendations  Rolling walker (2 wheels)    Recommendations for Other Services       Precautions / Restrictions Precautions Precautions: Fall;Other (comment) Precaution Comments: RUQ drain Restrictions Weight Bearing Restrictions: No     Mobility  Bed Mobility Overal bed mobility: Needs Assistance Bed Mobility: Supine to Sit     Supine to sit: Supervision     General bed mobility comments: NT standing by in doorway for pt safety    Transfers Overall transfer level: Needs assistance Equipment used: Rolling walker (2 wheels) Transfers: Sit to/from Stand, Bed to chair/wheelchair/BSC Sit to Stand: Min guard, Min assist           General transfer comment: pt following cues better today to stand, but forgets where to place hands safely; from toilet height<>RW needing minA for stability/safety    Ambulation/Gait Ambulation/Gait assistance: Min assist Gait Distance (Feet): 70 Feet Assistive device: Rolling walker (2 wheels) Gait Pattern/deviations: Step-to pattern, Step-through pattern, Trunk flexed Gait velocity: variable; grossly <0.4 m/s throughout     General Gait Details: poor RW management, pt holding it too far advanced, improves briefly with multimodal cues; min guard to minA for stability     Balance Overall balance assessment: Needs assistance Sitting-balance support: No upper extremity supported Sitting balance-Leahy Scale: Good     Standing balance support: During functional activity, Bilateral upper extremity supported, Reliant on assistive device for balance Standing balance-Leahy Scale: Poor Standing balance comment: reliant on UE support and external assist due to cognition                            Cognition Arousal/Alertness: Awake/alert Behavior During Therapy:  Impulsive Overall  Cognitive Status: History of cognitive impairments - at baseline                                 General Comments: pt agreeable to session, but cued to wait until regular RW brought to room, when PTA returning to room, bed alarm beeping pt had sat herself up impulsively to EOB. Poor awareness of lines and does not understand "turn left" vs "turn right".        Exercises Other Exercises Other Exercises: supine BLE AROM: ankle pumps x20 reps, pt not following instructions well for other motions 2/2 dementia/confusion, will attempt again next session    General Comments General comments (skin integrity, edema, etc.): SpO2 WFL on RA >92% when pt not using Sylvain with sensor, when pt grasping RW very noisy signal; RN notified pt does not have her teeth so pt is not able to chew regular foods properly and thus did not eat food on her lunch tray totalA for peri care after toileting due to cognitive deficit.     Pertinent Vitals/Pain Pain Assessment Pain Assessment: Faces Faces Pain Scale: Hurts a little bit Pain Location: RUQ drain site Pain Descriptors / Indicators: Grimacing, Guarding Pain Intervention(s): Monitored during session, Repositioned           PT Goals (current goals can now be found in the care plan section) Acute Rehab PT Goals Patient Stated Goal: to feel better and go home PT Goal Formulation: Patient unable to participate in goal setting Time For Goal Achievement: 09/11/22 Potential to Achieve Goals:  (PT notified pt needs goal update next session) Progress towards PT goals: Progressing toward goals    Frequency    Min 2X/week      PT Plan Current plan remains appropriate       AM-PAC PT "6 Clicks" Mobility   Outcome Measure  Help needed turning from your back to your side while in a flat bed without using bedrails?: A Little Help needed moving from lying on your back to sitting on the side of a flat bed without using bedrails?: A Little Help  needed moving to and from a bed to a chair (including a wheelchair)?: A Little Help needed standing up from a chair using your arms (e.g., wheelchair or bedside chair)?: A Little Help needed to walk in hospital room?: A Lot (mod safety cues) Help needed climbing 3-5 steps with a railing? : A Lot 6 Click Score: 16    End of Session Equipment Utilized During Treatment: Gait belt Activity Tolerance: Patient tolerated treatment well Patient left: with call bell/phone within reach;in chair;with chair alarm set;with nursing/sitter in room;Other (comment) (tele sitter in room) Nurse Communication: Mobility status;Other (comment);Precautions (pt may need softer diet due to poor dentition) PT Visit Diagnosis: Unsteadiness on feet (R26.81);Muscle weakness (generalized) (M62.81)     Time: 4270-6237 PT Time Calculation (min) (ACUTE ONLY): 26 min  Charges:  $Gait Training: 8-22 mins $Therapeutic Activity: 8-22 mins                     Morganne Haile P., PTA Acute Rehabilitation Services Secure Chat Preferred 9a-5:30pm Office: [place] 09/13/2022, 4:47 PM

## 2022-09-13 NOTE — Progress Notes (Signed)
Mobility Specialist Progress Note   09/13/22 1756  Mobility  Activity Transferred from bed to chair  Level of Assistance Contact guard assist, steadying assist  Assistive Device Front wheel walker  Distance Ambulated (ft) 4 ft  Activity Response Tolerated well  Mobility Referral Yes  $Mobility charge 1 Mobility   Received in chair crying and real anxious about being isolated in room. Reassured pt for a little while and then helped get back to bed. Pt feeling better, call bell in reach and all needs met.   Holland Falling Mobility Specialist Please contact via SecureChat or  Rehab office at [phone]

## 2022-09-14 ENCOUNTER — Encounter: Payer: Self-pay | Admitting: Gastroenterology

## 2022-09-14 ENCOUNTER — Encounter (HOSPITAL_COMMUNITY): Payer: Self-pay | Admitting: Gastroenterology

## 2022-09-14 DIAGNOSIS — K810 Acute cholecystitis: Secondary | ICD-10-CM | POA: Diagnosis not present

## 2022-09-14 LAB — GLUCOSE, CAPILLARY
Glucose-Capillary: 122 mg/dL — ABNORMAL HIGH (ref 70–99)
Glucose-Capillary: 102 mg/dL — ABNORMAL HIGH (ref 70–99)
Glucose-Capillary: 125 mg/dL — ABNORMAL HIGH (ref 70–99)

## 2022-09-14 LAB — CBC WITH DIFFERENTIAL/PLATELET
Abs Immature Granulocytes: 0.04 10*3/uL (ref 0.00–0.07)
Basophils Absolute: 0.1 10*3/uL (ref 0.0–0.1)
Basophils Relative: 1 %
Eosinophils Absolute: 0.4 10*3/uL (ref 0.0–0.5)
Eosinophils Relative: 4 %
HCT: 38.9 % (ref 36.0–46.0)
Hemoglobin: 12.1 g/dL (ref 12.0–15.0)
Immature Granulocytes: 0 %
Lymphocytes Relative: 29 %
Lymphs Abs: 2.9 10*3/uL (ref 0.7–4.0)
MCH: 29.5 pg (ref 26.0–34.0)
MCHC: 31.1 g/dL (ref 30.0–36.0)
MCV: 94.9 fL (ref 80.0–100.0)
Monocytes Absolute: 1.1 10*3/uL — ABNORMAL HIGH (ref 0.1–1.0)
Monocytes Relative: 11 %
Neutro Abs: 5.3 10*3/uL (ref 1.7–7.7)
Neutrophils Relative %: 55 %
Platelets: 360 10*3/uL (ref 150–400)
RBC: 4.1 MIL/uL (ref 3.87–5.11)
RDW: 15.5 % (ref 11.5–15.5)
WBC: 9.8 10*3/uL (ref 4.0–10.5)
nRBC: 0 % (ref 0.0–0.2)

## 2022-09-14 LAB — BASIC METABOLIC PANEL
Anion gap: 7 (ref 5–15)
BUN: 16 mg/dL (ref 8–23)
CO2: 26 mmol/L (ref 22–32)
Calcium: 9.1 mg/dL (ref 8.9–10.3)
Chloride: 106 mmol/L (ref 98–111)
Creatinine, Ser: 0.87 mg/dL (ref 0.44–1.00)
GFR, Estimated: >60 mL/min (ref >60)
Glucose, Bld: 105 mg/dL — ABNORMAL HIGH (ref 70–99)
Potassium: 4.3 mmol/L (ref 3.5–5.1)
Sodium: 139 mmol/L (ref 135–145)

## 2022-09-14 LAB — MAGNESIUM: Magnesium: 1.9 mg/dL (ref 1.7–2.4)

## 2022-09-14 NOTE — Progress Notes (Signed)
Progress Note    Kathleen Valenzuela   IWP:[id number]  DOB: 09/21/1950  DOA: 08/25/2022     20 PCP: Monico Blitz, MD  Initial CC: AMS  Hospital Course: Kathleen Valenzuela is a 72 yo female with PMH chronic anemia, anxiety, bipolar 1 disorder, COPD, GERD, heart murmur, HLD, HTN who presented with AMS.  She presented from her SNF to the hospital. She underwent workup with CT, ultrasound, and MRCP.  Findings were consistent with a distended sludge-filled gallbladder with diffuse wall thickening and pericholecystic edema.  She underwent evaluation with GI and general surgery.  She underwent percutaneous tube placement on 08/27/2022. Repeat imaging was performed on 09/04/2022 and showed interval development of a right anterolateral abscess formation and second abscess formation in the right adnexal region.  Interval History:  Eating well this AM No complaints  Assessment and Plan:  Sepsis due to acute cholecystitis - Had extensive workup with CT abdomen/pelvis, HIDA scan and MRCP.  Seen by CCS/ IR and transferred from [facility] to [facility] - cholecystostomy - performed on 08/27/2022 - S/P ultrasound as well as CT abdomen 12/31 " Interval development of a 4.1 x 1.8 cm right anterolateral abscess formation. Likely second abscess forming measuring 1.9 x 1.6 cm in the region of the right adnexa. Cholecystostomy tube in good position with decompressed gallbladder lumen."  - blood cultures remained negative; Ucx insignificant growth but treated likely by default -Transitioned to Zosyn on 09/09/2022 (d/c rocephin/flagyl).  WBC did show some improvement prior to Zosyn transition and has continued to improve (transient elevation s/p ERCP then coming down again) - therefore will finish 10 days course of Zosyn >> Augmentin (5 days left on Augmentin, stop date 09/17/22) - repeat MRCP on 09/09/22 showed choledocholithiasis - ERCP performed on 09/11/2022.  Sludge and stone removed from CBD  Leukocytosis - resolved  GOC discussions -  per Dr. Sabino Gasser: spoke with Kathleen Valenzuela (son) and Kathleen Valenzuela (friend) on 09/09/22; explained in detail that she is not a good surgical candidate and may be having ongoing complications from cholecystitis that antibiotics may not be able to treat through but time will tell.  Also concern for her nutritional status as she has not been eating much for several days now and in setting of underlying dementia, she may further decline without adequate nutrition.  Options would be for trial of cortrak placement and starting tube feeds versus PICC placement and starting TPN vs transition to hospice/comfort. I did not recommend PEG placement in setting of her dementia and no benefit to overall QOL in this clinical context. They are open to all options and if prognosis did worsen they understand that hospice/comfort would be reasonable as well --Has improved and suspect she should be able to plan on discharging to rehab on Thursday  Acute metabolic encephalopathy -resolved -Suspected multifactorial from sepsis and underlying dementia.  Also has been on prolonged cefepime; see reasoning above for antibiotic transition  Dementia with behavioral disturbances Bipolar disorder -Oriented to name and president typically -Continue Namenda and Aricept - Continue Cogentin, Wellbutrin, trazodone, Effexor -Now back to normal baseline  Acute hypoxic respiratory failure -resolved - on RA Acute on chronic diastolic CHF - resolved -Now euvolemic Normocytic anemia - Baseline hemoglobin around 12 to 13 g/dL Thrombocytopenia resolved. Chronic atrial fibrillation continue Eliquis and metoprolol Hypokalemia - replete as needed GERD:Continue PPI Dysphagia: SLP eval    Antimicrobials: Cefepime 08/26/2022 >> 09/07/22 Flagyl 08/26/2022 >> 09/09/22 Rocephin 09/07/21 >> 09/09/22 Zosyn 09/09/22 >> 09/13/22 Augmentin 09/13/22 >> current  DVT prophylaxis:  SCDs Start: 08/26/22 0137   Code Status:   Code Status: Full Code     Barriers to  discharge:  Disposition Plan:  SNF in AM? Status is: Inpt  Objective: Blood pressure (!) 123/92, pulse 83, temperature 97.9 F (36.6 C), temperature source Oral, resp. rate 19, height '5\' 7"'$  (1.702 m), weight 78.6 kg, SpO2 100 %.   General: Appearance:     Overweight female in no acute distress     Lungs:     Clear to auscultation bilaterally, respirations unlabored  Heart:    Normal heart rate.   MS:   All extremities are intact.   Neurologic:   Awake, alert   Consultants:  GI General surgery  Procedures:  08/27/22: PCT to RUQ 09/11/2022: ERCP  Data Reviewed: Results for orders placed or performed during the hospital encounter of 08/25/22 (from the past 24 hour(s))  Glucose, capillary     Status: Abnormal   Collection Time: 09/13/22 11:08 AM  Result Value Ref Range   Glucose-Capillary 144 (H) 70 - 99 mg/dL  Glucose, capillary     Status: Abnormal   Collection Time: 09/13/22  3:59 PM  Result Value Ref Range   Glucose-Capillary 135 (H) 70 - 99 mg/dL  Basic metabolic panel     Status: Abnormal   Collection Time: 09/14/22  3:22 AM  Result Value Ref Range   Sodium 139 135 - 145 mmol/L   Potassium 4.3 3.5 - 5.1 mmol/L   Chloride 106 98 - 111 mmol/L   CO2 26 22 - 32 mmol/L   Glucose, Bld 105 (H) 70 - 99 mg/dL   BUN 16 8 - 23 mg/dL   Creatinine, Ser 0.87 0.44 - 1.00 mg/dL   Calcium 9.1 8.9 - 10.3 mg/dL   GFR, Estimated >60 >60 mL/min   Anion gap 7 5 - 15  CBC with Differential/Platelet     Status: Abnormal   Collection Time: 09/14/22  3:22 AM  Result Value Ref Range   WBC 9.8 4.0 - 10.5 K/uL   RBC 4.10 3.87 - 5.11 MIL/uL   Hemoglobin 12.1 12.0 - 15.0 g/dL   HCT 38.9 36.0 - 46.0 %   MCV 94.9 80.0 - 100.0 fL   MCH 29.5 26.0 - 34.0 pg   MCHC 31.1 30.0 - 36.0 g/dL   RDW 15.5 11.5 - 15.5 %   Platelets 360 150 - 400 K/uL   nRBC 0.0 0.0 - 0.2 %   Neutrophils Relative % 55 %   Neutro Abs 5.3 1.7 - 7.7 K/uL   Lymphocytes Relative 29 %   Lymphs Abs 2.9 0.7 - 4.0 K/uL    Monocytes Relative 11 %   Monocytes Absolute 1.1 (H) 0.1 - 1.0 K/uL   Eosinophils Relative 4 %   Eosinophils Absolute 0.4 0.0 - 0.5 K/uL   Basophils Relative 1 %   Basophils Absolute 0.1 0.0 - 0.1 K/uL   Immature Granulocytes 0 %   Abs Immature Granulocytes 0.04 0.00 - 0.07 K/uL  Magnesium     Status: None   Collection Time: 09/14/22  3:22 AM  Result Value Ref Range   Magnesium 1.9 1.7 - 2.4 mg/dL  Glucose, capillary     Status: Abnormal   Collection Time: 09/14/22  6:53 AM  Result Value Ref Range   Glucose-Capillary 122 (H) 70 - 99 mg/dL  Glucose, capillary     Status: Abnormal   Collection Time: 09/14/22  7:47 AM  Result Value Ref Range  Glucose-Capillary 102 (H) 70 - 99 mg/dL     Time spent: Greater than 50% of the 55 minute visit was spent in counseling/coordination of care for the patient as laid out in the A&P.   LOS: 20 days   Geradine Girt, DO [facility] 09/14/2022, 10:48 AM

## 2022-09-14 NOTE — TOC Progression Note (Signed)
Transition of Care [facility]) - Progression Note    Patient Details  Name: BERA PINELA MRN: [id number] Date of Birth: 14-Feb-1951  [facility]) CM/SW Contact  Joanne Chars, LCSW Phone Number: 09/14/2022, 3:11 PM  Clinical Narrative:   CSW spoke with [facility].  They do not have available bed currently, will have more information tomorrow.    Expected Discharge Plan: [place] Barriers to Discharge: Continued Medical Work up, SNF Pending bed offer  Expected Discharge Plan and Services In-house Referral: Clinical Social Work   Post Acute Care Choice: [place] Living arrangements for the past 2 months: [place] ([facility] in [place])                                       Social Determinants of Health (SDOH) Interventions [place]: No Food Insecurity (09/01/2022)  Housing: Low Risk  (09/01/2022)  Transportation Needs: No Transportation Needs (09/01/2022)  Utilities: Not At Risk (09/01/2022)  Tobacco Use: Medium Risk (09/11/2022)    Readmission Risk Interventions     No data to display

## 2022-09-14 NOTE — Progress Notes (Signed)
Mobility Specialist Progress Note   09/14/22 1050  Mobility  Activity Ambulated with assistance in room;Ambulated with assistance in hallway  Level of Assistance Contact guard assist, steadying assist  Assistive Device Front wheel walker  Distance Ambulated (ft) 84 ft  Activity Response Tolerated well  Mobility Referral Yes  $Mobility charge 1 Mobility   Pre Mobility: 64 HR, 97% SpO2 on RA  During Mobility: 82 HR, 92% SpO2 on RA  Post Mobility: 81 HR, 95% SpO2 on RA  Received in bed c/o back hurting (5/10) but agreeable. Able to get EOB > stand > hallway ambulation w/ CGA. X1 seated break d/t fatigue but able to recover quickly. Returned back to chair w/o fault, call bell in reach and chair alarm on.   Holland Falling Mobility Specialist Please contact via SecureChat or  Rehab office at [phone]

## 2022-09-14 NOTE — Plan of Care (Signed)

## 2022-09-14 NOTE — Care Management Important Message (Signed)
Important Message  Patient Details  Name: Kathleen Valenzuela MRN: [id number] Date of Birth: 1951-02-24   Medicare Important Message Given:  Yes     Hannah Beat 09/14/2022, 11:52 AM

## 2022-09-14 NOTE — Progress Notes (Signed)
Physical Therapy Treatment Patient Details Name: Kathleen Valenzuela MRN: [id number] DOB: January 03, 1951 Today's Date: 09/14/2022   History of Present Illness Pt is a 72 yo female admitted from SNF on 08/25/22 with AMS; workup for acute cholycystitis, sepsis. Worsening respiratory status 12/25 requiring BiPAP; rapid response 12/26 for respiratory status and BiPAP intolerance, transfer to ICU. PMH includes dementia, anemia, anxiety, bipolar 1 disorder, COPD, GERD, heart murmur, HLD, HTN.    PT Comments    Pt progressing towards physical therapy goals. Pt calling out to therapist passing by to ask for back to bed. Pt educated on call bell use and agreeable to ambulate during session. Pt had episode of diarrhea and required assist to perform hygiene after toileting. Pt requiring intermittent cues throughout for general safety. SNF level rehab remains the most appropriate d/c disposition at this time. Will continue to follow.     Recommendations for follow up therapy are one component of a multi-disciplinary discharge planning process, led by the attending physician.  Recommendations may be updated based on patient status, additional functional criteria and insurance authorization.  Follow Up Recommendations  Skilled nursing-short term rehab (<3 hours/day) Can patient physically be transported by private vehicle: Yes   Assistance Recommended at Discharge Frequent or constant Supervision/Assistance  Patient can return home with the following A lot of help with bathing/dressing/bathroom;Direct supervision/assist for medications management;Direct supervision/assist for financial management;Assist for transportation;Help with stairs or ramp for entrance;Assistance with cooking/housework;A little help with walking and/or transfers   Equipment Recommendations  Rolling walker (2 wheels)    Recommendations for Other Services       Precautions / Restrictions Precautions Precautions: Fall;Other  (comment) Precaution Comments: RUQ drain Restrictions Weight Bearing Restrictions: No     Mobility  Bed Mobility Overal bed mobility: Needs Assistance Bed Mobility: Sit to Supine       Sit to supine: Supervision   General bed mobility comments: No assist required. Pt was able to transition to supine with min use of rails.    Transfers Overall transfer level: Needs assistance Equipment used: Rolling walker (2 wheels) Transfers: Sit to/from Stand Sit to Stand: Min guard           General transfer comment: VC's for Swartzlander placement on seated surface for safety. No assist required. Pt was able to gain/maintain standing balance well.    Ambulation/Gait Ambulation/Gait assistance: Min assist Gait Distance (Feet): 25 Feet Assistive device: Rolling walker (2 wheels) Gait Pattern/deviations: Step-through pattern, Trunk flexed, Drifts right/left Gait velocity: Decreased Gait velocity interpretation: <1.31 ft/sec, indicative of household ambulator   General Gait Details: VC's for improved posture, closer walker proximity, and forward gaze. Occasional assist for walker management and balance but no overt LOB noted.   Stairs             Wheelchair Mobility    Modified Rankin (Stroke Patients Only)       Balance Overall balance assessment: Needs assistance Sitting-balance support: No upper extremity supported Sitting balance-Leahy Scale: Good     Standing balance support: During functional activity, Bilateral upper extremity supported, Reliant on assistive device for balance Standing balance-Leahy Scale: Poor Standing balance comment: reliant on UE support and external assist due to cognition                            Cognition Arousal/Alertness: Awake/alert Behavior During Therapy: Impulsive Overall Cognitive Status: History of cognitive impairments - at baseline  Exercises       General Comments        Pertinent Vitals/Pain Pain Assessment Pain Assessment: Faces Faces Pain Scale: Hurts a little bit Pain Location: Buttocks Pain Descriptors / Indicators: Grimacing, Sore Pain Intervention(s): Limited activity within patient's tolerance, Monitored during session, Repositioned    Home Living                          Prior Function            PT Goals (current goals can now be found in the care plan section) Acute Rehab PT Goals Patient Stated Goal: to feel better and go home PT Goal Formulation: Patient unable to participate in goal setting Time For Goal Achievement: 09/28/22 Potential to Achieve Goals: Good Progress towards PT goals: Progressing toward goals    Frequency    Min 2X/week      PT Plan Current plan remains appropriate    Co-evaluation              AM-PAC PT "6 Clicks" Mobility   Outcome Measure  Help needed turning from your back to your side while in a flat bed without using bedrails?: A Little Help needed moving from lying on your back to sitting on the side of a flat bed without using bedrails?: A Little Help needed moving to and from a bed to a chair (including a wheelchair)?: A Little Help needed standing up from a chair using your arms (e.g., wheelchair or bedside chair)?: A Little Help needed to walk in hospital room?: A Little Help needed climbing 3-5 steps with a railing? : A Little 6 Click Score: 18    End of Session Equipment Utilized During Treatment: Gait belt Activity Tolerance: Patient tolerated treatment well Patient left: with call bell/phone within reach;in chair;with chair alarm set;with nursing/sitter in room;Other (comment) (tele sitter in room) Nurse Communication: Mobility status;Other (comment) ([place] status) PT Visit Diagnosis: Unsteadiness on feet (R26.81);Muscle weakness (generalized) (M62.81)     Time: 5784-6962 PT Time Calculation (min) (ACUTE ONLY): 19 min  Charges:   $Gait Training: 8-22 mins                     Kathleen Valenzuela, PT, DPT Acute Rehabilitation Services Secure Chat Preferred Office: [phone]    Kathleen Valenzuela 09/14/2022, 3:30 PM

## 2022-09-15 ENCOUNTER — Encounter (HOSPITAL_COMMUNITY): Payer: Self-pay | Admitting: Family Medicine

## 2022-09-15 DIAGNOSIS — K810 Acute cholecystitis: Secondary | ICD-10-CM | POA: Diagnosis not present

## 2022-09-15 LAB — CBC WITH DIFFERENTIAL/PLATELET
Abs Immature Granulocytes: 0.07 10*3/uL (ref 0.00–0.07)
Basophils Absolute: 0.1 10*3/uL (ref 0.0–0.1)
Basophils Relative: 1 %
Eosinophils Absolute: 0.5 10*3/uL (ref 0.0–0.5)
Eosinophils Relative: 4 %
HCT: 39.6 % (ref 36.0–46.0)
Hemoglobin: 12.9 g/dL (ref 12.0–15.0)
Immature Granulocytes: 1 %
Lymphocytes Relative: 20 %
Lymphs Abs: 2.6 10*3/uL (ref 0.7–4.0)
MCH: 29.8 pg (ref 26.0–34.0)
MCHC: 32.6 g/dL (ref 30.0–36.0)
MCV: 91.5 fL (ref 80.0–100.0)
Monocytes Absolute: 1.3 10*3/uL — ABNORMAL HIGH (ref 0.1–1.0)
Monocytes Relative: 10 %
Neutro Abs: 8.3 10*3/uL — ABNORMAL HIGH (ref 1.7–7.7)
Neutrophils Relative %: 64 %
Platelets: 352 10*3/uL (ref 150–400)
RBC: 4.33 MIL/uL (ref 3.87–5.11)
RDW: 15.1 % (ref 11.5–15.5)
WBC: 12.9 10*3/uL — ABNORMAL HIGH (ref 4.0–10.5)
nRBC: 0 % (ref 0.0–0.2)

## 2022-09-15 LAB — GLUCOSE, CAPILLARY
Glucose-Capillary: 101 mg/dL — ABNORMAL HIGH (ref 70–99)
Glucose-Capillary: 83 mg/dL (ref 70–99)
Glucose-Capillary: 155 mg/dL — ABNORMAL HIGH (ref 70–99)
Glucose-Capillary: 109 mg/dL — ABNORMAL HIGH (ref 70–99)

## 2022-09-15 LAB — MAGNESIUM: Magnesium: 1.8 mg/dL (ref 1.7–2.4)

## 2022-09-15 MED ORDER — LOPERAMIDE HCL 2 MG PO CAPS: 2.0000 mg | ORAL_CAPSULE | Freq: Two times a day (BID) | ORAL | 0 refills | Status: AC | PRN

## 2022-09-15 MED ORDER — ISOSORBIDE MONONITRATE ER 30 MG PO TB24: 15.0000 mg | ORAL_TABLET | Freq: Every day | ORAL | Status: AC

## 2022-09-15 MED ORDER — AMOXICILLIN-POT CLAVULANATE 875-125 MG PO TABS: 1.0000 | ORAL_TABLET | Freq: Two times a day (BID) | ORAL | Status: DC

## 2022-09-15 MED ORDER — RISAQUAD PO CAPS
2.0000 | ORAL_CAPSULE | Freq: Every day | ORAL | Status: DC
  Administered 2022-09-15 – 2022-09-16 (×2): 2 via ORAL
  Filled 2022-09-15 (×2): qty 2

## 2022-09-15 MED ORDER — TIZANIDINE HCL 2 MG PO TABS: 2.0000 mg | ORAL_TABLET | Freq: Two times a day (BID) | ORAL | 0 refills | Status: AC

## 2022-09-15 MED ORDER — ALPRAZOLAM 1 MG PO TABS: 0.5000 mg | ORAL_TABLET | Freq: Three times a day (TID) | ORAL | 0 refills | Status: DC | PRN

## 2022-09-15 MED ORDER — METOPROLOL SUCCINATE ER 25 MG PO TB24: 25.0000 mg | ORAL_TABLET | Freq: Every day | ORAL | Status: AC

## 2022-09-15 MED ORDER — APIXABAN 5 MG PO TABS
5.0000 mg | ORAL_TABLET | Freq: Two times a day (BID) | ORAL | Status: DC
  Administered 2022-09-15 – 2022-09-16 (×3): 5 mg via ORAL
  Filled 2022-09-15 (×3): qty 1

## 2022-09-15 MED ORDER — RISAQUAD PO CAPS: 2.0000 | ORAL_CAPSULE | Freq: Every day | ORAL | Status: DC

## 2022-09-15 NOTE — TOC Progression Note (Signed)
Transition of Care [facility]) - Progression Note    Patient Details  Name: Kathleen Valenzuela MRN: [id number] Date of Birth: 1951-06-20  [facility]) CM/SW Contact  Joanne Chars, LCSW Phone Number: 09/15/2022, 3:42 PM  Clinical Narrative:   Per [facility], no available beds, no scheduled DC before Monday.  One pt appealing a DC, but no resolution yet.  CSW reached out to son [name] to discuss sending referral out to other facilities. LM    Expected Discharge Plan: [place] Barriers to Discharge: Continued Medical Work up, SNF Pending bed offer  Expected Discharge Plan and Services In-house Referral: Clinical Social Work   Post Acute Care Choice: [place] Living arrangements for the past 2 months: [place] ([facility] in [place]) Expected Discharge Date: 09/15/22                                     Social Determinants of Health ([place]) Interventions [place]: No Food Insecurity (09/01/2022)  Housing: Low Risk  (09/01/2022)  Transportation Needs: No Transportation Needs (09/01/2022)  Utilities: Not At Risk (09/01/2022)  Tobacco Use: Medium Risk (09/15/2022)    Readmission Risk Interventions     No data to display

## 2022-09-15 NOTE — Plan of Care (Signed)
  Problem: Education: Goal: Knowledge of General Education information will improve Description Including pain rating scale, medication(s)/side effects and non-pharmacologic comfort measures Outcome: Progressing   

## 2022-09-15 NOTE — Discharge Summary (Addendum)
Physician Discharge Summary  Kathleen Valenzuela CBJ:[id number] DOB: 14-Dec-1950 DOA: 08/25/2022  PCP: Monico Blitz, MD  Admit date: 08/25/2022 Discharge date: 09/16/2022  Admitted From: home Discharge disposition: SNF   Recommendations for Outpatient Follow-Up:   Will need outpatient follow up with IR drain clinic: Typically these are 6-8 weeks after placement. The SNF will need to do daily flushing and track output.  Augmentin, stop date 09/17/22   Discharge Diagnosis:   Principal Problem:   Sepsis ([place]) Active Problems:   Acute cholecystitis   Goals of care, counseling/discussion   Dementia with behavioral disturbance (HCC)   Bipolar disorder (HCC)   High cholesterol   GERD (gastroesophageal reflux disease)   COPD (chronic obstructive pulmonary disease) ([place])   Essential hypertension   Diastolic dysfunction with heart failure (HCC)   Atrial fibrillation, chronic (HCC)   Acute metabolic encephalopathy   Leukocytosis   Choledocholithiasis   Malnutrition of moderate degree    Discharge Condition: Improved.  Diet recommendation: DYS 3 diet  Wound care: None.  Code status: Full.   History of Present Illness:   Kathleen Valenzuela is a 72 yo female with PMH chronic anemia, anxiety, bipolar 1 disorder, COPD, GERD, heart murmur, HLD, HTN who presented with AMS.  She presented from her SNF to the hospital. She underwent workup with CT, ultrasound, and MRCP.  Findings were consistent with a distended sludge-filled gallbladder with diffuse wall thickening and pericholecystic edema.  She underwent evaluation with GI and general surgery.  She underwent percutaneous tube placement on 08/27/2022. Repeat imaging was performed on 09/04/2022 and showed interval development of a right anterolateral abscess formation and second abscess formation in the right adnexal region.   Hospital Course by Problem:   Sepsis due to acute cholecystitis - Had extensive workup with CT abdomen/pelvis,  HIDA scan and MRCP.  Seen by [facility]/ IR and transferred from [facility] to [facility] - cholecystostomy - performed on 08/27/2022 - S/P ultrasound as well as CT abdomen 12/31 " Interval development of a 4.1 x 1.8 cm right anterolateral abscess formation. Likely second abscess forming measuring 1.9 x 1.6 cm in the region of the right adnexa. Cholecystostomy tube in good position with decompressed gallbladder lumen."  - blood cultures remained negative; Ucx insignificant growth but treated likely by default -Transitioned to Zosyn on 09/09/2022 (d/c rocephin/flagyl).  WBC did show some improvement prior to Zosyn transition and has continued to improve (transient elevation s/p ERCP then coming down again) - therefore will finish 10 days course of Zosyn >> Augmentin (5 days left on Augmentin, stop date 09/17/22) - repeat MRCP on 09/09/22 showed choledocholithiasis - ERCP performed on 09/11/2022.  Sludge and stone removed from CBD  -outpatient follow up with GS if needed -will need follow up with IR drain clinic   [place] discussions - per Dr. Sabino Gasser: spoke with Kathleen Valenzuela (son) and Kathleen Valenzuela (friend) on 09/09/22; explained in detail that she is not a good surgical candidate and may be having ongoing complications from cholecystitis that antibiotics may not be able to treat through but time will tell.  Also concern for her nutritional status as she has not been eating much for several days now and in setting of underlying dementia, she may further decline without adequate nutrition.  Options would be for trial of cortrak placement and starting tube feeds versus PICC placement and starting TPN vs transition to hospice/comfort. I did not recommend PEG placement in setting of her dementia and no benefit to overall QOL in  this clinical context. They are open to all options and if prognosis did worsen they understand that hospice/comfort would be reasonable as well  Acute metabolic encephalopathy -resolved -Suspected multifactorial from sepsis and  underlying dementia or medication   Dementia with behavioral disturbances Bipolar disorder -Oriented to name and president typically -Continue Namenda and Aricept - Continue Cogentin, Wellbutrin, trazodone, Effexor -Now back to normal baseline   Acute hypoxic respiratory failure -resolved - on RA Acute on chronic diastolic CHF - resolved -Now euvolemic Normocytic anemia - Baseline hemoglobin around 12 to 13 g/dL Thrombocytopenia resolved. Chronic atrial fibrillation continue Eliquis and metoprolol Hypokalemia - replete as needed GERD: Continue PPI Dysphagia: DYS diet        Medical Consultants:   IR GI GS    Discharge Exam:   Vitals:   09/15/22 2007 09/16/22 0600  BP: (!) 123/96   Pulse: 84 86  Resp: 18 16  Temp: 98 F (36.7 C) 98.2 F (36.8 C)  SpO2: 91% 97%   Vitals:   09/15/22 1640 09/15/22 2007 09/16/22 0415 09/16/22 0600  BP: 124/75 (!) 123/96    Pulse: 84 84  86  Resp: '18 18  16  '$ Temp: 98.4 F (36.9 C) 98 F (36.7 C)  98.2 F (36.8 C)  TempSrc: Oral     SpO2: 97% 91%  97%  Weight:   77.7 kg   Height:        General exam: Appears calm and comfortable.    The results of significant diagnostics from this hospitalization (including imaging, microbiology, ancillary and laboratory) are listed below for reference.     Procedures and Diagnostic Studies:   ECHOCARDIOGRAM COMPLETE  Result Date: 08/26/2022    ECHOCARDIOGRAM REPORT   Patient Name:   Kathleen Valenzuela Date of Exam: 08/26/2022 Medical Rec #:  [id number]     Height:       67.0 in Accession #:    [id number]    Weight:       180.0 lb Date of Birth:  Mar 27, 1951      BSA:          1.934 m Patient Age:    9 years      BP:           102/52 mmHg Patient Gender: F             HR:           57 bpm. Exam Location:  [facility] Procedure: 2D Echo, Cardiac Doppler and Color Doppler Indications:    Pre operative clearance  History:        Patient has prior history of Echocardiogram examinations, most                  recent 04/14/2020. CHF, COPD, Signs/Symptoms:Murmur, Alzheimer's,                 Shortness of Breath and Dyspnea; Risk Factors:Dyslipidemia and                 Hypertension.  Sonographer:    Eartha Inch Referring Phys: [place] Comments: Technically difficult study due to poor echo windows. Image acquisition challenging due to patient body habitus and Image acquisition challenging due to respiratory motion. IMPRESSIONS  1. Left ventricular ejection fraction, by estimation, is 60 to 65%. The left ventricle has normal function. The left ventricle has no regional wall motion abnormalities. Left ventricular diastolic parameters are indeterminate.  2. Right ventricular systolic function  is normal. The right ventricular size is normal. Tricuspid regurgitation signal is inadequate for assessing PA pressure.  3. Left atrial size was upper normal.  4. The mitral valve is degenerative. Mild mitral valve regurgitation. Mild mitral stenosis. The mean mitral valve gradient is 4.0 mmHg. Moderate mitral annular calcification.  5. The aortic valve is tricuspid. There is moderate calcification of the aortic valve. Aortic valve regurgitation is mild. Mild to moderate aortic valve stenosis. Aortic valve area, by VTI measures 1.24 cm. Aortic valve mean gradient measures 16.0 mmHg. Dimentionless index 0.49.  6. The inferior vena cava is normal in size with <50% respiratory variability, suggesting right atrial pressure of 8 mmHg. Comparison(s): Prior images reviewed side by side. Mild progression in aortic stenosis, mild to moderate range. There is also mild mitral stenosis. FINDINGS  Left Ventricle: Left ventricular ejection fraction, by estimation, is 60 to 65%. The left ventricle has normal function. The left ventricle has no regional wall motion abnormalities. The left ventricular internal cavity size was normal in size. There is  borderline left ventricular hypertrophy. Left ventricular  diastolic function could not be evaluated due to mitral annular calcification (moderate or greater). Left ventricular diastolic parameters are indeterminate. Right Ventricle: The right ventricular size is normal. No increase in right ventricular wall thickness. Right ventricular systolic function is normal. Tricuspid regurgitation signal is inadequate for assessing PA pressure. Left Atrium: Left atrial size was upper normal. Right Atrium: Right atrial size was normal in size. Pericardium: There is no evidence of pericardial effusion. Mitral Valve: The mitral valve is degenerative in appearance. There is mild thickening of the mitral valve leaflet(s). There is mild calcification of the mitral valve leaflet(s). Moderate mitral annular calcification. Mild mitral valve regurgitation. Mild mitral valve stenosis. MV peak gradient, 8.5 mmHg. The mean mitral valve gradient is 4.0 mmHg. Tricuspid Valve: The tricuspid valve is grossly normal. Tricuspid valve regurgitation is mild . No evidence of tricuspid stenosis. Aortic Valve: The aortic valve is tricuspid. There is moderate calcification of the aortic valve. There is mild to moderate aortic valve annular calcification. Aortic valve regurgitation is mild. Aortic regurgitation PHT measures 508 msec. Mild to moderate aortic stenosis is present. Aortic valve mean gradient measures 16.0 mmHg. Aortic valve peak gradient measures 24.7 mmHg. Aortic valve area, by VTI measures 1.24 cm. Pulmonic Valve: The pulmonic valve was not well visualized. Pulmonic valve regurgitation is not visualized. No evidence of pulmonic stenosis. Aorta: The aortic root is normal in size and structure. Venous: The inferior vena cava is normal in size with less than 50% respiratory variability, suggesting right atrial pressure of 8 mmHg. IAS/Shunts: No atrial level shunt detected by color flow Doppler.  LEFT VENTRICLE PLAX 2D LVIDd:         3.90 cm     Diastology LVIDs:         2.90 cm     LV e' medial:     4.26 cm/s LV PW:         1.10 cm     LV E/e' medial:  27.5 LV IVS:        1.00 cm     LV e' lateral:   3.09 cm/s LVOT diam:     1.80 cm     LV E/e' lateral: 37.9 LV SV:         74 LV SV Index:   38 LVOT Area:     2.54 cm  LV Volumes (MOD) LV vol d, MOD A2C: 88.0 ml LV  vol d, MOD A4C: 98.6 ml LV vol s, MOD A2C: 38.9 ml LV vol s, MOD A4C: 43.8 ml LV SV MOD A2C:     49.1 ml LV SV MOD A4C:     98.6 ml LV SV MOD BP:      55.9 ml RIGHT VENTRICLE            IVC RV S prime:     9.07 cm/s  IVC diam: 1.90 cm TAPSE (M-mode): 2.0 cm LEFT ATRIUM             Index        RIGHT ATRIUM           Index LA diam:        3.90 cm 2.02 cm/m   RA Area:     12.50 cm LA Vol (A2C):   63.3 ml 32.74 ml/m  RA Volume:   26.30 ml  13.60 ml/m LA Vol (A4C):   56.6 ml 29.27 ml/m LA Biplane Vol: 61.9 ml 32.01 ml/m  AORTIC VALVE AV Area (Vmax):    1.19 cm AV Area (Vmean):   1.16 cm AV Area (VTI):     1.24 cm AV Vmax:           248.50 cm/s AV Vmean:          172.000 cm/s AV VTI:            0.600 m AV Peak Grad:      24.7 mmHg AV Mean Grad:      16.0 mmHg LVOT Vmax:         116.00 cm/s LVOT Vmean:        78.400 cm/s LVOT VTI:          0.292 m LVOT/AV VTI ratio: 0.49 AI PHT:            508 msec  AORTA Ao Root diam: 3.40 cm Ao Asc diam:  3.50 cm MITRAL VALVE MV Area (PHT): 1.73 cm     SHUNTS MV Area VTI:   0.81 cm     Systemic VTI:  0.29 m MV Peak grad:  8.5 mmHg     Systemic Diam: 1.80 cm MV Mean grad:  4.0 mmHg MV Vmax:       1.46 m/s MV Vmean:      93.6 cm/s MV Decel Time: 438 msec MV E velocity: 117.00 cm/s MV A velocity: 138.00 cm/s MV E/A ratio:  0.85 Rozann Lesches MD Electronically signed by Rozann Lesches MD Signature Date/Time: 08/26/2022/2:18:01 PM    Final    NM Hepatobiliary Liver Func  Result Date: 08/26/2022 CLINICAL DATA:  Sepsis, fever, abnormal gallbladder by CT and MR consistent with acute cholecystitis and note of a 4 mm CBD calculus versus sludge EXAM: NUCLEAR MEDICINE HEPATOBILIARY IMAGING TECHNIQUE: Sequential  images of the abdomen were obtained out to 60 minutes following intravenous administration of radiopharmaceutical. RADIOPHARMACEUTICALS:  4 mCi Tc-46m Choletec IV COMPARISON:  CT abdomen 08/25/2022, ultrasound abdomen limited 08/26/2022, MR abdomen 08/26/2022 FINDINGS: Study was performed 10 minutes following patient receiving morphine IV for pain. Normal tracer extraction from bloodstream indicating normal hepatocellular function. Large photopenic area at the RIGHT lobe of the liver corresponding to attenuation of the liver by distended gallbladder is noted on coronal CT. Prompt concentration and excretion of tracer by the liver. Duodenum visualized at 35 minutes. Gallbladder did not fill during the hour of imaging. IMPRESSION: Normal hepatocellular function with patent CBD. Nonvisualization of the gallbladder despite morphine consistent with  acute cholecystitis, acalculous by ultrasound and MR. Findings called to Dr. Arnoldo Morale on 08/26/2022 at 1124 hours. Electronically Signed   By: Lavonia Dana M.D.   On: 08/26/2022 11:24   MR ABDOMEN WITH MRCP W CONTRAST  Result Date: 08/26/2022 CLINICAL DATA:  Right upper quadrant abdominal pain, biliary ductal dilatation and abnormal gallbladder on ultrasound. EXAM: MRI ABDOMEN WITH CONTRAST (WITH MRCP) TECHNIQUE: Multiplanar multisequence MR imaging of the abdomen was performed following the administration of intravenous contrast. Heavily T2-weighted images of the biliary and pancreatic ducts were obtained, and three-dimensional MRCP images were rendered by post processing. CONTRAST:  38m GADAVIST GADOBUTROL 1 MMOL/ML IV SOLN COMPARISON:  08/26/2022 right upper quadrant abdominal sonogram. 08/25/2022 CT chest, abdomen and pelvis. FINDINGS: Lower chest: No acute abnormality at the lung bases. Hepatobiliary: Normal liver size and configuration. Mild diffuse hepatic steatosis. No liver mass. Distended gallbladder (5.2 cm diameter). Mild diffuse gallbladder wall thickening.  Patchy pericholecystic fat stranding and minimal ill-defined fluid. Gallbladder nearly filled with sludge. No gallstones. There is focal adenomyomatosis at the fundal gallbladder wall. Mild diffuse central intrahepatic biliary ductal dilatation. Dilated common bile duct with diameter 13 mm. There is a 4 mm T2 intermediate signal intensity filling defect in the lower third of the CBD. No enhancing biliary masses. No beading of the bile ducts. Pancreas: No pancreatic mass or duct dilation.  No pancreas divisum. Spleen: Normal size spleen. Small lobulated cystic 1.9 cm posterior splenic lesion with thin internal septations, compatible with a lymphangioma. No additional splenic lesions. Adrenals/Urinary Tract: Normal adrenals. No left hydronephrosis. Asymmetric mild fullness of the right renal collecting system without overt right hydronephrosis, not appreciably changed from 08/25/2022 CT. A few scattered bilateral small Bosniak category 2 hemorrhagic/proteinaceous renal cysts with internal T1 hyperintensity and no appreciable enhancement, largest 1.2 cm in the interpolar right kidney (series 20/image 68). Additional scattered simple bilateral subcentimeter renal cysts. No suspicious renal masses. Stomach/Bowel: Tiny hiatal hernia. Otherwise normal nondistended stomach. Visualized small and large bowel is normal caliber, with no bowel wall thickening. Moderate diffuse colonic diverticulosis. Vascular/Lymphatic: Atherosclerotic nonaneurysmal abdominal aorta. Patent portal, splenic, hepatic and renal veins. No pathologically enlarged lymph nodes in the abdomen. Other: No abdominal ascites or focal fluid collection. Musculoskeletal: No aggressive appearing focal osseous lesions. IMPRESSION: 1. Distended sludge filled gallbladder with mild diffuse wall thickening and pericholecystic edema. No gallstones in the gallbladder. Findings are compatible with acute acalculous cholecystitis in the correct clinical setting. 2. Mild  diffuse central intrahepatic biliary ductal dilatation. Dilated common bile duct (13 mm diameter) with solitary 4 mm intermediate T2 signal intensity filling defect in the lower third of the CBD, compatible with either a stone or sludge. No enhancing biliary masses. 3. Mild diffuse hepatic steatosis. 4. Tiny hiatal hernia. 5. Moderate diffuse colonic diverticulosis. 6. Asymmetric mild fullness of the right renal collecting system without overt right hydronephrosis, not appreciably changed from 08/25/2022 CT. 7. Small Bosniak category 1 and category 2 renal cysts. No suspicious renal masses. Electronically Signed   By: JIlona SorrelM.D.   On: 08/26/2022 09:28   [place]oreaAbdomen Limited RUQ (LIVER/GB)  Result Date: 08/26/2022 CLINICAL DATA:  [id number] pain [id number] EXAM: ULTRASOUND ABDOMEN LIMITED RIGHT UPPER QUADRANT COMPARISON:  08/25/2022 CT chest, abdomen and pelvis FINDINGS: Gallbladder: Distended gallbladder with mild diffuse gallbladder wall thickening and with moderate layering gallbladder sludge. No shadowing gallstones demonstrated (scan limited by patient inability to roll into the left lateral decubitus position). Sonographic Murphy sign present. No convincing pericholecystic fluid. Common  bile duct: Diameter: 10 mm Liver: No focal lesion identified. Within normal limits in parenchymal echogenicity. Portal vein is patent on color Doppler imaging with normal direction of blood flow towards the liver. Other: None. IMPRESSION: 1. Distended gallbladder with mild diffuse gallbladder wall thickening and moderate sludge. Sonographic Murphy sign present. No shadowing gallstones. No pericholecystic fluid. Findings are equivocal for acute acalculus cholecystitis. Consider hepatobiliary scintigraphy for further evaluation as clinically warranted. 2. Dilated common bile duct (10 mm diameter). Correlate with serum bilirubin levels. MRI abdomen with MRCP without and with IV contrast may be considered for further  evaluation as clinically warranted. 3. Liver within normal limits. Electronically Signed   By: Ilona Sorrel M.D.   On: 08/26/2022 08:05   CT CHEST ABDOMEN PELVIS W CONTRAST  Result Date: 08/25/2022 CLINICAL DATA:  Sepsis.  Fever. EXAM: CT CHEST, ABDOMEN, AND PELVIS WITH CONTRAST TECHNIQUE: Multidetector CT imaging of the chest, abdomen and pelvis was performed following the standard protocol during bolus administration of intravenous contrast. RADIATION DOSE REDUCTION: This exam was performed according to the departmental dose-optimization program which includes automated exposure control, adjustment of the mA and/or kV according to patient size and/or use of iterative reconstruction technique. CONTRAST:  115m OMNIPAQUE IOHEXOL 300 MG/ML  SOLN COMPARISON:  CT abdomen and pelvis 12/09/2019. Report only CT chest abdomen and pelvis 05/03/2017. FINDINGS: CT CHEST FINDINGS Cardiovascular: No significant vascular findings. Normal heart size. No pericardial effusion. There are atherosclerotic calcifications of the aorta. Mediastinum/Nodes: No enlarged mediastinal, hilar, or axillary lymph nodes. Thyroid gland, trachea, and esophagus demonstrate no significant findings. Lungs/Pleura: Patchy nodular airspace opacity seen in the right upper lobe centrally image 3/50 measuring 6 mm. There is a 5 mm nodule in the left lower lobe image 3/40. There is minimal atelectasis or scarring in the right lung base. The lungs are otherwise clear. There is no pleural effusion or pneumothorax. Musculoskeletal: No chest wall mass or suspicious bone lesions identified. CT ABDOMEN PELVIS FINDINGS Hepatobiliary: The gallbladder is dilated and there is gallbladder wall thickening. There is intra and extrahepatic biliary ductal dilatation. The common bile duct measures 15 mm. There is mild inflammatory stranding surrounding the gallbladder. No focal liver lesions are seen. Pancreas: Unremarkable. No pancreatic ductal dilatation or  surrounding inflammatory changes. Spleen: There is a 12 mm splenic cyst or hemangioma which is unchanged. The spleen is nonenlarged. Adrenals/Urinary Tract: There are subcentimeter cortical hypodensities in both kidneys favored as cysts. There is no hydronephrosis or perinephric fluid. The adrenal glands and bladder are within normal limits. Stomach/Bowel: There is no evidence for bowel obstruction, pneumatosis, free air or inflammatory stranding. Again seen is sigmoid colon diverticulosis. There is questionable sigmoid colon wall thickening versus normal under distension. Stomach, small bowel and appendix are within normal limits. Vascular/Lymphatic: Aortic atherosclerosis. No enlarged abdominal or pelvic lymph nodes. Reproductive: Uterus and bilateral adnexa are unremarkable. Other: No abdominal wall hernia or abnormality. No abdominopelvic ascites. Musculoskeletal: Degenerative changes affect the spine. IMPRESSION: 1. Findings compatible with acute cholecystitis. There is intra and extrahepatic biliary ductal dilatation. 2. Questionable sigmoid colon wall thickening versus normal under distension. Correlate clinically for colitis. Follow-up colonoscopy should be considered. 3. Sigmoid colon diverticulosis. 4. Patchy nodular airspace opacity in the right upper lobe measuring 6 mm and 5 mm nodule in the left lower lobe. Non-contrast chest CT at 3-6 months is recommended. If the nodules are stable at time of repeat CT, then future CT at 18-24 months (from today's scan) is considered optional for low-risk  patients, but is recommended for high-risk patients. This recommendation follows the consensus statement: Guidelines for Management of Incidental Pulmonary Nodules Detected on CT Images: From the Fleischner Society 2017; Radiology 2017; 284:228-243. Aortic Atherosclerosis (ICD10-I70.0). Electronically Signed   By: Ronney Asters M.D.   On: 08/25/2022 22:42   DG Chest Port 1 View  Result Date: 08/25/2022 CLINICAL  DATA:  Sepsis EXAM: PORTABLE CHEST 1 VIEW COMPARISON:  04/03/2022 FINDINGS: Mild right basilar scarring/atelectasis. Left lung is clear No pleural effusion or pneumothorax. The heart normal in size. Thoracic aortic atherosclerosis. IMPRESSION: No evidence of acute cardiopulmonary disease. Electronically Signed   By: Julian Hy M.D.   On: 08/25/2022 20:30     Labs:   Basic Metabolic Panel: Recent Labs  Lab 09/10/22 0359 09/11/22 0318 09/12/22 0358 09/13/22 0403 09/14/22 0322 09/15/22 0400 09/16/22 0409  NA 135 135 138 137 139  --   --   K 4.3 3.9 4.4 3.9 4.3  --   --   CL 104 102 106 104 106  --   --   CO2 23 20* '22 24 26  '$ --   --   GLUCOSE 112* 111* 141* 89 105*  --   --   BUN '18 19 15 13 16  '$ --   --   CREATININE 0.89 0.89 0.84 0.84 0.87  --   --   CALCIUM 9.2 8.8* 9.1 9.0 9.1  --   --   MG 2.1 2.0 2.0 1.9 1.9 1.8 1.8   GFR Estimated Creatinine Clearance: 64.6 mL/min (by C-G formula based on SCr of 0.87 mg/dL). Liver Function Tests: Recent Labs  Lab 09/10/22 0350  AST 24  ALT 17  ALKPHOS 71  BILITOT 0.5  PROT 7.6  ALBUMIN 2.9*   No results for input(s): "LIPASE", "AMYLASE" in the last 168 hours. No results for input(s): "AMMONIA" in the last 168 hours. Coagulation profile No results for input(s): "INR", "PROTIME" in the last 168 hours.  CBC: Recent Labs  Lab 09/12/22 0358 09/13/22 0403 09/14/22 0322 09/15/22 0400 09/16/22 0409  WBC 17.3* 11.6* 9.8 12.9* 9.3  NEUTROABS 14.4* 6.9 5.3 8.3* 5.0  HGB 11.5* 12.2 12.1 12.9 12.3  HCT 35.6* 36.7 38.9 39.6 37.4  MCV 92.2 92.2 94.9 91.5 91.9  PLT 394 338 360 352 333   Cardiac Enzymes: No results for input(s): "CKTOTAL", "CKMB", "CKMBINDEX", "TROPONINI" in the last 168 hours. BNP: Invalid input(s): "POCBNP" CBG: Recent Labs  Lab 09/15/22 0800 09/15/22 1207 09/15/22 1638 09/16/22 0601 09/16/22 0735  GLUCAP 83 155* 109* 142* 104*   D-Dimer No results for input(s): "DDIMER" in the last 72 hours. Hgb  A1c No results for input(s): "HGBA1C" in the last 72 hours. Lipid Profile No results for input(s): "CHOL", "HDL", "LDLCALC", "TRIG", "CHOLHDL", "LDLDIRECT" in the last 72 hours. Thyroid function studies No results for input(s): "TSH", "T4TOTAL", "T3FREE", "THYROIDAB" in the last 72 hours.  Invalid input(s): "FREET3" Anemia work up No results for input(s): "VITAMINB12", "FOLATE", "FERRITIN", "TIBC", "IRON", "RETICCTPCT" in the last 72 hours. Microbiology No results found for this or any previous visit (from the past 240 hour(s)).   Discharge Instructions:   Discharge Instructions     Change dressing (specify)   Complete by: As directed    Dressing changes as needed to keep clean and dry.   Discharge instructions   Complete by: As directed    DYS 3 diet   Discharge instructions   Complete by: As directed    Patient will discharge home  with drain in place.  Flush drain daily with 5-10 mL sterile saline. Dressing changes Q3days or PRN if soiled/wet and record output once daily. Schedulers will contact with date and time of follow-up appointment.   Increase activity slowly   Complete by: As directed    No wound care   Complete by: As directed       Allergies as of 09/16/2022   No Active Allergies      Medication List     STOP taking these medications    doxycycline 100 MG capsule Commonly known as: VIBRAMYCIN       TAKE these medications    acetaminophen 650 MG CR tablet Commonly known as: TYLENOL Take 650 mg by mouth every 8 (eight) hours as needed for pain.   acidophilus Caps capsule Take 2 capsules by mouth daily.   albuterol 108 (90 Base) MCG/ACT inhaler Commonly known as: VENTOLIN HFA Inhale 2 puffs into the lungs 4 (four) times daily. (0800, 1200, 1600, & 2000)   ALPRAZolam 1 MG tablet Commonly known as: XANAX Take 0.5 tablets (0.5 mg total) by mouth 3 (three) times daily as needed for anxiety. (0800, 1400, & 2000) What changed:  when to take  this reasons to take this additional instructions   amoxicillin-clavulanate 875-125 MG tablet Commonly known as: AUGMENTIN Take 1 tablet by mouth every 12 (twelve) hours.   apixaban 5 MG Tabs tablet Commonly known as: ELIQUIS Take 1 tablet (5 mg total) by mouth 2 (two) times daily.   atorvastatin 10 MG tablet Commonly known as: LIPITOR Take 1 tablet (10 mg total) by mouth daily at 8 pm. Resume on 04/22/20 if LFTs are back to normal What changed:  how much to take additional instructions   benztropine 1 MG tablet Commonly known as: COGENTIN Take 0.5 mg by mouth 2 (two) times daily.   Breo Ellipta 200-25 MCG/INH Aepb Generic drug: fluticasone furoate-vilanterol Inhale 1 puff into the lungs daily. (0800)   buPROPion 150 MG 24 hr tablet Commonly known as: WELLBUTRIN XL Take 150 mg by mouth every morning.   donepezil 10 MG tablet Commonly known as: ARICEPT Take 10 mg by mouth daily at 8 pm.   Ferrous Gluconate 324 (37.5 Fe) MG Tabs Take 324 mg by mouth 2 (two) times daily. (0800 & 2000)   fluticasone 50 MCG/ACT nasal spray Commonly known as: FLONASE Place 1 spray into both nostrils daily. (0800)   furosemide 40 MG tablet Commonly known as: LASIX Take 40 mg by mouth daily. (0800)   gabapentin 300 MG capsule Commonly known as: NEURONTIN Take 300 mg by mouth 3 (three) times daily.   hydrocortisone 1 % ointment Apply 1 Application topically 3 (three) times daily.   isosorbide mononitrate 30 MG 24 hr tablet Commonly known as: IMDUR Take 0.5 tablets (15 mg total) by mouth daily. What changed:  how much to take additional instructions   Latuda 80 MG Tabs tablet Generic drug: lurasidone Take 40 mg by mouth at bedtime.   loperamide 2 MG capsule Commonly known as: IMODIUM Take 1 capsule (2 mg total) by mouth 2 (two) times daily as needed for diarrhea or loose stools.   memantine 10 MG tablet Commonly known as: NAMENDA Take 10 mg by mouth 2 (two) times daily.    metoprolol succinate 25 MG 24 hr tablet Commonly known as: TOPROL-XL Take 1 tablet (25 mg total) by mouth daily. What changed:  medication strength how much to take additional instructions   pantoprazole 40 MG  tablet Commonly known as: PROTONIX Take 1 tablet (40 mg total) by mouth 2 (two) times daily before a meal.   Pataday 0.2 % Soln Generic drug: Olopatadine HCl Place 1 drop into both eyes daily. (0800)   potassium chloride 10 MEQ tablet Commonly known as: KLOR-CON Take 1 tablet by mouth daily.   Risamine 0.44-20.625 % Oint Generic drug: Menthol-Zinc Oxide Apply 1 application topically 4 (four) times daily as needed (for rash/redness).   tiZANidine 2 MG tablet Commonly known as: ZANAFLEX Take 1 tablet (2 mg total) by mouth 2 (two) times daily. (0800 & 2000)   traZODone 100 MG tablet Commonly known as: DESYREL Take 100 mg by mouth at bedtime.   venlafaxine XR 150 MG 24 hr capsule Commonly known as: EFFEXOR-XR Take 300 mg by mouth daily.               Discharge Care Instructions  (From admission, onward)           Start     Ordered   09/15/22 0000  Change dressing (specify)       Comments: Dressing changes as needed to keep clean and dry.   09/15/22 1226            Contact information for follow-up providers     El-Abd, Joesph Fillers, MD Follow up.   Specialties: Interventional Radiology, Diagnostic Radiology, Radiology Why: Scheduler will contact you with date and time of appointment Contact information: [address] [place] [address] [phone]              Contact information for after-discharge care     Destination     [facility] .   Service: Skilled Nursing Contact information: [address] [place] [phone]                      Time coordinating discharge: 45 min  Signed:  Geradine Girt DO  [facility] 09/16/2022, 10:55  AM

## 2022-09-15 NOTE — Plan of Care (Signed)
  Problem: Education: Goal: Knowledge of General Education information will improve Description: Including pain rating scale, medication(s)/side effects and non-pharmacologic comfort measures 09/15/2022 1139 by Emmaline Life, RN Outcome: Progressing 09/15/2022 1137 by Emmaline Life, RN Outcome: Progressing

## 2022-09-15 NOTE — Progress Notes (Signed)
Mobility Specialist Progress Note   09/15/22 1055  Mobility  Activity Dangled on edge of bed  Level of Assistance Contact guard assist, steadying assist  Assistive Device None  Activity Response Tolerated well  Mobility Referral Yes  $Mobility charge 1 Mobility   Pre Mobility: 78 HR, 134/90 BP, 97% SpO2 on 2LO2 During Mobility: 95 HR, 92% SpO2 on 2LO2 Post Mobility: 80 HR, 94% SpO2 on 2LO2  Received in bed seemingly more irritable and c/o bottom side pain but agreeable. Stand by assist to EOB but pt expressing they felt overwhelmed and exhausted, practice breathing techniques and reverted to seated exercises. Pt able to complete all exercise w/o complaint and exemplify  well mobility. Pt feeling better at end of session and laid back supine w/ all needs met. RN notified about pain and irritability.    Holland Falling Mobility Specialist Please contact via SecureChat or  Rehab office at [phone]

## 2022-09-16 DIAGNOSIS — E440 Moderate protein-calorie malnutrition: Secondary | ICD-10-CM | POA: Diagnosis not present

## 2022-09-16 DIAGNOSIS — A419 Sepsis, unspecified organism: Secondary | ICD-10-CM | POA: Diagnosis not present

## 2022-09-16 DIAGNOSIS — I5030 Unspecified diastolic (congestive) heart failure: Secondary | ICD-10-CM | POA: Diagnosis not present

## 2022-09-16 DIAGNOSIS — K811 Chronic cholecystitis: Secondary | ICD-10-CM | POA: Diagnosis not present

## 2022-09-16 DIAGNOSIS — R410 Disorientation, unspecified: Secondary | ICD-10-CM | POA: Diagnosis not present

## 2022-09-16 DIAGNOSIS — I5032 Chronic diastolic (congestive) heart failure: Secondary | ICD-10-CM | POA: Diagnosis not present

## 2022-09-16 DIAGNOSIS — J449 Chronic obstructive pulmonary disease, unspecified: Secondary | ICD-10-CM | POA: Diagnosis not present

## 2022-09-16 DIAGNOSIS — I4891 Unspecified atrial fibrillation: Secondary | ICD-10-CM | POA: Diagnosis not present

## 2022-09-16 DIAGNOSIS — K810 Acute cholecystitis: Secondary | ICD-10-CM | POA: Diagnosis not present

## 2022-09-16 DIAGNOSIS — R1311 Dysphagia, oral phase: Secondary | ICD-10-CM | POA: Diagnosis not present

## 2022-09-16 DIAGNOSIS — R5381 Other malaise: Secondary | ICD-10-CM | POA: Diagnosis not present

## 2022-09-16 DIAGNOSIS — D559 Anemia due to enzyme disorder, unspecified: Secondary | ICD-10-CM | POA: Diagnosis not present

## 2022-09-16 DIAGNOSIS — Z743 Need for continuous supervision: Secondary | ICD-10-CM | POA: Diagnosis not present

## 2022-09-16 DIAGNOSIS — F03918 Unspecified dementia, unspecified severity, with other behavioral disturbance: Secondary | ICD-10-CM | POA: Diagnosis not present

## 2022-09-16 DIAGNOSIS — K829 Disease of gallbladder, unspecified: Secondary | ICD-10-CM | POA: Diagnosis not present

## 2022-09-16 DIAGNOSIS — K812 Acute cholecystitis with chronic cholecystitis: Secondary | ICD-10-CM | POA: Diagnosis not present

## 2022-09-16 DIAGNOSIS — M6281 Muscle weakness (generalized): Secondary | ICD-10-CM | POA: Diagnosis not present

## 2022-09-16 DIAGNOSIS — K219 Gastro-esophageal reflux disease without esophagitis: Secondary | ICD-10-CM | POA: Diagnosis not present

## 2022-09-16 DIAGNOSIS — I10 Essential (primary) hypertension: Secondary | ICD-10-CM | POA: Diagnosis not present

## 2022-09-16 DIAGNOSIS — R627 Adult failure to thrive: Secondary | ICD-10-CM | POA: Diagnosis not present

## 2022-09-16 DIAGNOSIS — E785 Hyperlipidemia, unspecified: Secondary | ICD-10-CM | POA: Diagnosis not present

## 2022-09-16 DIAGNOSIS — R1319 Other dysphagia: Secondary | ICD-10-CM | POA: Diagnosis not present

## 2022-09-16 DIAGNOSIS — G300 Alzheimer's disease with early onset: Secondary | ICD-10-CM | POA: Diagnosis not present

## 2022-09-16 DIAGNOSIS — R109 Unspecified abdominal pain: Secondary | ICD-10-CM | POA: Diagnosis not present

## 2022-09-16 DIAGNOSIS — Z7401 Bed confinement status: Secondary | ICD-10-CM | POA: Diagnosis not present

## 2022-09-16 DIAGNOSIS — K8050 Calculus of bile duct without cholangitis or cholecystitis without obstruction: Secondary | ICD-10-CM | POA: Diagnosis not present

## 2022-09-16 DIAGNOSIS — I700 Atherosclerosis of aorta: Secondary | ICD-10-CM | POA: Diagnosis not present

## 2022-09-16 DIAGNOSIS — R2689 Other abnormalities of gait and mobility: Secondary | ICD-10-CM | POA: Diagnosis not present

## 2022-09-16 DIAGNOSIS — E559 Vitamin D deficiency, unspecified: Secondary | ICD-10-CM | POA: Diagnosis not present

## 2022-09-16 LAB — CBC WITH DIFFERENTIAL/PLATELET
Abs Immature Granulocytes: 0.07 10*3/uL (ref 0.00–0.07)
Basophils Absolute: 0.1 10*3/uL (ref 0.0–0.1)
Basophils Relative: 1 %
Eosinophils Absolute: 0.5 10*3/uL (ref 0.0–0.5)
Eosinophils Relative: 6 %
HCT: 37.4 % (ref 36.0–46.0)
Hemoglobin: 12.3 g/dL (ref 12.0–15.0)
Immature Granulocytes: 1 %
Lymphocytes Relative: 29 %
Lymphs Abs: 2.7 10*3/uL (ref 0.7–4.0)
MCH: 30.2 pg (ref 26.0–34.0)
MCHC: 32.9 g/dL (ref 30.0–36.0)
MCV: 91.9 fL (ref 80.0–100.0)
Monocytes Absolute: 0.9 10*3/uL (ref 0.1–1.0)
Monocytes Relative: 10 %
Neutro Abs: 5 10*3/uL (ref 1.7–7.7)
Neutrophils Relative %: 53 %
Platelets: 333 10*3/uL (ref 150–400)
RBC: 4.07 MIL/uL (ref 3.87–5.11)
RDW: 15.2 % (ref 11.5–15.5)
WBC: 9.3 10*3/uL (ref 4.0–10.5)
nRBC: 0 % (ref 0.0–0.2)

## 2022-09-16 LAB — GLUCOSE, CAPILLARY
Glucose-Capillary: 142 mg/dL — ABNORMAL HIGH (ref 70–99)
Glucose-Capillary: 104 mg/dL — ABNORMAL HIGH (ref 70–99)
Glucose-Capillary: 121 mg/dL — ABNORMAL HIGH (ref 70–99)

## 2022-09-16 LAB — MAGNESIUM: Magnesium: 1.8 mg/dL (ref 1.7–2.4)

## 2022-09-16 NOTE — Progress Notes (Signed)
Mobility Specialist Progress Note   09/16/22 1118  Mobility  Activity Ambulated with assistance in hallway  Level of Assistance Contact guard assist, steadying assist  Assistive Device Front wheel walker  Distance Ambulated (ft) 142 ft  Activity Response Tolerated well  Mobility Referral Yes  $Mobility charge 1 Mobility   Post Mobility: 90 HR, 99% SpO2 on RA w/ portable pulse ox  Received in chair c/o slight pain at drain site but agreeable. Mod cues required to impulsivity but CG for all mobility. Ambulated in hallway w/o fault and returned back to bed. Pt expressing SOB but VSS. Encouraged PLB for ~7mns and pt feeling better. Left w/ call bell in reach and bed alarm on.   JHolland FallingMobility Specialist Please contact via SecureChat or  Rehab office at [phone]

## 2022-09-16 NOTE — Progress Notes (Signed)
Nutrition Follow-up  DOCUMENTATION CODES:   Non-severe (moderate) malnutrition in context of chronic illness  INTERVENTION:  Continue Ensure Enlive po BID, each supplement provides 350 kcal and 20 grams of protein. Continue Magic cup TID with meals, each supplement provides 290 kcal and 9 grams of protein MVI with minerals daily  NUTRITION DIAGNOSIS:   Moderate Malnutrition related to chronic illness (COPD) as evidenced by mild fat depletion, mild muscle depletion, severe muscle depletion.  ongoing  GOAL:   Patient will meet greater than or equal to 90% of their needs  Progressing, addressing via meals and nutrition supplements  MONITOR:   PO intake, Supplement acceptance, Labs, Weight trends, Skin, I & O's  REASON FOR ASSESSMENT:   NPO/Clear Liquid Diet    ASSESSMENT:   72 y/o female with h/o bipolar disorder, dementia, GERD, COPD, HTN, CHF, AFib, duodenal stricture, esophageal dysphagia s/p dilation, PUD, diverticulitis with abscess and anxiety who is admitted with sepsis secondary to acute cholecytitis s/p IR cholecystostomy tube 12/23.  Awaiting SNF placement.    12/23: cholecystostomy tube placement 1/5: repeat MRCP findings of choledocholithiasis 1/7: ERCP findings of sludge and stone removed from CBD; diet advanced to dysphagia 3/thin  Meal completions: 1/8: 100% breakfast, 20% lunch 1/9: 0% lunch, 50% dinner 1/10: 50% lunch  [place] previously recommended d/t poor PO intake while on dysphagia 1 diet. Noted improvements in PO intake since diet upgrade. Family open to all options of alternate nutrition if indicated per discussion with MD.   Pt sitting in chair at time of visit. She is in good spirits. Endorses enjoying the food she is receiving however observed about 25% of breakfast consumed today. She had already consumed 1 Ensure at time of visit and was ready for another which RN had just brought in.   Reviewed admission weights. Pt's weight continues to  gradually decline.  Admit weight: 81.6 kg Current weight: 77.7 kg  Medications: acidophilus, MVI, protonix   Labs reviewed  RUQ JP drain: 123m x24 hours  Diet Order:   Diet Order             DIET DYS 3 Room service appropriate? Yes; Fluid consistency: Thin  Diet effective now                   EDUCATION NEEDS:   Education needs have been addressed  Skin:  Skin Assessment: Reviewed RN Assessment (ecchymosis, incision abdomen (s/p drain))  Last BM:  1/10  Height:   Ht Readings from Last 1 Encounters:  09/11/22 '5\' 7"'$  (1.702 m)    Weight:   Wt Readings from Last 1 Encounters:  09/16/22 77.7 kg    Ideal Body Weight:  61.36 kg  BMI:  Body mass index is 26.83 kg/m.  Estimated Nutritional Needs:   Kcal:  1700-1900kcal/day  Protein:  85-95g/day  Fluid:  1.7-1.9L/day  AClayborne Dana RDN, LDN Clinical Nutrition

## 2022-09-16 NOTE — Progress Notes (Signed)
Spoke to Kathleen Valenzuela with [facility] who reports they have a bed for pt today. Per [employer], auth request was cancelled yesterday. New auth request submitted today (ref # [id number]). [facility] can accept pt today if auth received. Confirmed with Ebony Hail [phone] they can accept over weekend pending auth. MD updated.   Wandra Feinstein, MSW, LCSW [phone] (coverage)

## 2022-09-16 NOTE — Progress Notes (Signed)
Await SNF placement.  No overnight events Eulogio Bear DO

## 2022-09-16 NOTE — TOC Transition Note (Signed)
Transition of Care [facility]) - CM/SW Discharge Note   Patient Details  Name: Kathleen Valenzuela MRN: [id number] Date of Birth: February 05, 1951  [facility] CM/SW Contact:  Amador Cunas, [place] Phone Number: 09/16/2022, 12:43 PM   Clinical Narrative: Pt for dc to [facility] today. [place] auth received and confirmed with Ebony Hail at [facility] they are prepared to admit pt to room 415-1. Pt's son aware of dc and reports agreeable. RN provided with number for report and [facility] arranged for transport. SW signing off at dc.   Wandra Feinstein, MSW, LCSW [phone] (coverage)        Final next level of care: [place] Barriers to Discharge: No Barriers Identified   Patient Goals and CMS Choice   Choice offered to / list presented to : Adult Children  Discharge Placement                Patient chooses bed at: [facility]) Patient to be transferred to facility by: [place] Name of family member notified: Kathleen Valenzuela and Kathleen Valenzuela Patient and family notified of of transfer: 09/16/22  Discharge Plan and Services Additional resources added to the After Visit Summary for   In-house Referral: Clinical Social Work   Post Acute Care Choice: [place]                               Social Determinants of Health ([place]) Interventions [place]: No Food Insecurity (09/01/2022)  Housing: Low Risk  (09/01/2022)  Transportation Needs: No Transportation Needs (09/01/2022)  Utilities: Not At Risk (09/01/2022)  Tobacco Use: Medium Risk (09/15/2022)     Readmission Risk Interventions     No data to display

## 2022-09-20 DIAGNOSIS — R5381 Other malaise: Secondary | ICD-10-CM | POA: Diagnosis not present

## 2022-09-20 DIAGNOSIS — A419 Sepsis, unspecified organism: Secondary | ICD-10-CM | POA: Diagnosis not present

## 2022-09-20 DIAGNOSIS — K829 Disease of gallbladder, unspecified: Secondary | ICD-10-CM | POA: Diagnosis not present

## 2022-09-21 DIAGNOSIS — I5032 Chronic diastolic (congestive) heart failure: Secondary | ICD-10-CM | POA: Diagnosis not present

## 2022-09-21 DIAGNOSIS — K812 Acute cholecystitis with chronic cholecystitis: Secondary | ICD-10-CM | POA: Diagnosis not present

## 2022-09-21 DIAGNOSIS — R627 Adult failure to thrive: Secondary | ICD-10-CM | POA: Diagnosis not present

## 2022-09-21 DIAGNOSIS — F03918 Unspecified dementia, unspecified severity, with other behavioral disturbance: Secondary | ICD-10-CM | POA: Diagnosis not present

## 2022-09-26 DIAGNOSIS — I5032 Chronic diastolic (congestive) heart failure: Secondary | ICD-10-CM | POA: Diagnosis not present

## 2022-09-26 DIAGNOSIS — F03918 Unspecified dementia, unspecified severity, with other behavioral disturbance: Secondary | ICD-10-CM | POA: Diagnosis not present

## 2022-09-26 DIAGNOSIS — K811 Chronic cholecystitis: Secondary | ICD-10-CM | POA: Diagnosis not present

## 2022-09-28 ENCOUNTER — Other Ambulatory Visit: Payer: Self-pay | Admitting: *Deleted

## 2022-09-28 NOTE — Patient Outreach (Signed)
Ms. Swarthout resides in [facility] skilled nursing facility. Screening for potential [facility] care coordination services as benefit of health plan and Primary Care Provider.   Update received from [place], [place] Education officer, museum. Ms. Baxley wants to return to [facility]. Currently has drain. Transition plans pending.   Will continue to follow.   [name], MSN, RN,BSN [place] Acute Care Coordinator [phone] (Direct dial)

## 2022-10-14 ENCOUNTER — Emergency Department (HOSPITAL_COMMUNITY): Payer: 59

## 2022-10-14 ENCOUNTER — Emergency Department (HOSPITAL_COMMUNITY)
Admission: EM | Admit: 2022-10-14 | Discharge: 2022-10-14 | Disposition: A | Discharge status: Skilled Nursing Facility | Payer: 59 | Attending: Emergency Medicine | Admitting: Emergency Medicine

## 2022-10-14 ENCOUNTER — Other Ambulatory Visit: Payer: Self-pay

## 2022-10-14 DIAGNOSIS — I119 Hypertensive heart disease without heart failure: Secondary | ICD-10-CM | POA: Diagnosis not present

## 2022-10-14 DIAGNOSIS — Z743 Need for continuous supervision: Secondary | ICD-10-CM | POA: Diagnosis not present

## 2022-10-14 DIAGNOSIS — T82519A Breakdown (mechanical) of unspecified cardiac and vascular devices and implants, initial encounter: Secondary | ICD-10-CM | POA: Diagnosis not present

## 2022-10-14 DIAGNOSIS — R5381 Other malaise: Secondary | ICD-10-CM | POA: Diagnosis not present

## 2022-10-14 DIAGNOSIS — D72829 Elevated white blood cell count, unspecified: Secondary | ICD-10-CM | POA: Insufficient documentation

## 2022-10-14 DIAGNOSIS — R1084 Generalized abdominal pain: Secondary | ICD-10-CM | POA: Diagnosis not present

## 2022-10-14 DIAGNOSIS — F0390 Unspecified dementia without behavioral disturbance: Secondary | ICD-10-CM | POA: Diagnosis not present

## 2022-10-14 DIAGNOSIS — R109 Unspecified abdominal pain: Secondary | ICD-10-CM | POA: Diagnosis not present

## 2022-10-14 DIAGNOSIS — R1011 Right upper quadrant pain: Secondary | ICD-10-CM | POA: Diagnosis not present

## 2022-10-14 DIAGNOSIS — R6889 Other general symptoms and signs: Secondary | ICD-10-CM | POA: Diagnosis not present

## 2022-10-14 DIAGNOSIS — Z79899 Other long term (current) drug therapy: Secondary | ICD-10-CM | POA: Diagnosis not present

## 2022-10-14 DIAGNOSIS — I700 Atherosclerosis of aorta: Secondary | ICD-10-CM | POA: Diagnosis not present

## 2022-10-14 LAB — CBC WITH DIFFERENTIAL/PLATELET
Abs Immature Granulocytes: 0.08 10*3/uL — ABNORMAL HIGH (ref 0.00–0.07)
Basophils Absolute: 0.1 10*3/uL (ref 0.0–0.1)
Basophils Relative: 1 %
Eosinophils Absolute: 0.4 10*3/uL (ref 0.0–0.5)
Eosinophils Relative: 3 %
HCT: 36.7 % (ref 36.0–46.0)
Hemoglobin: 12.3 g/dL (ref 12.0–15.0)
Immature Granulocytes: 1 %
Lymphocytes Relative: 21 %
Lymphs Abs: 3.5 10*3/uL (ref 0.7–4.0)
MCH: 30.3 pg (ref 26.0–34.0)
MCHC: 33.5 g/dL (ref 30.0–36.0)
MCV: 90.4 fL (ref 80.0–100.0)
Monocytes Absolute: 1.2 10*3/uL — ABNORMAL HIGH (ref 0.1–1.0)
Monocytes Relative: 7 %
Neutro Abs: 11.4 10*3/uL — ABNORMAL HIGH (ref 1.7–7.7)
Neutrophils Relative %: 67 %
Platelets: 274 10*3/uL (ref 150–400)
RBC: 4.06 MIL/uL (ref 3.87–5.11)
RDW: 15.4 % (ref 11.5–15.5)
WBC: 16.7 10*3/uL — ABNORMAL HIGH (ref 4.0–10.5)
nRBC: 0 % (ref 0.0–0.2)

## 2022-10-14 LAB — COMPREHENSIVE METABOLIC PANEL
ALT: 12 U/L (ref 0–44)
AST: 17 U/L (ref 15–41)
Albumin: 3.5 g/dL (ref 3.5–5.0)
Alkaline Phosphatase: 77 U/L (ref 38–126)
Anion gap: 10 (ref 5–15)
BUN: 13 mg/dL (ref 8–23)
CO2: 29 mmol/L (ref 22–32)
Calcium: 8.9 mg/dL (ref 8.9–10.3)
Chloride: 100 mmol/L (ref 98–111)
Creatinine, Ser: 0.92 mg/dL (ref 0.44–1.00)
GFR, Estimated: >60 mL/min (ref >60)
Glucose, Bld: 90 mg/dL (ref 70–99)
Potassium: 2.9 mmol/L — ABNORMAL LOW (ref 3.5–5.1)
Sodium: 139 mmol/L (ref 135–145)
Total Bilirubin: 0.4 mg/dL (ref 0.3–1.2)
Total Protein: 7.1 g/dL (ref 6.5–8.1)

## 2022-10-14 LAB — LACTIC ACID, PLASMA
Lactic Acid, Venous: 1.1 mmol/L (ref 0.5–1.9)
Lactic Acid, Venous: 0.8 mmol/L (ref 0.5–1.9)

## 2022-10-14 LAB — PROTIME-INR
INR: 1.5 — ABNORMAL HIGH (ref 0.8–1.2)
Prothrombin Time: 17.7 seconds — ABNORMAL HIGH (ref 11.4–15.2)

## 2022-10-14 MED ORDER — MORPHINE SULFATE (PF) 4 MG/ML IV SOLN
4.0000 mg | Freq: Once | INTRAVENOUS | Status: AC
  Administered 2022-10-14: 4 mg via INTRAVENOUS
  Filled 2022-10-14: qty 1

## 2022-10-14 MED ORDER — IOHEXOL 300 MG/ML  SOLN
100.0000 mL | Freq: Once | INTRAMUSCULAR | Status: AC | PRN
  Administered 2022-10-14: 100 mL via INTRAVENOUS

## 2022-10-14 NOTE — ED Provider Notes (Signed)
[facility] Provider Note   CSN: [id number] Arrival date & time: 10/14/22  1302     History  Chief Complaint  Patient presents with   Abdominal Pain    Kathleen Valenzuela is a 72 y.o. female.   Abdominal Pain    This pain is a 72 year old female, she has a history of gallstones, high cholesterol, hypertension and some heart disease.  She was admitted to the hospital in December 2023 with acute cholecystitis.  She had an ERCP performed at that time and was ultimately discharged on the 11th or 16 September 2022.  She had an IR drain that was placed, of note these are supposed to be on for approximately 6 to 8 weeks after placement.  It has not yet been that long.  The patient does have a history of dementia and is currently in a rehab facility.  She also has a history of atrial fibrillation.  She had a percutaneous tube that was placed on August 27, 2022, and abscess had formed during the hospitalization was noted on December 31, the patient was transitioned onto Zosyn, MRCP showed choledocholithiasis and an ERCP was performed on the seventh, sludge and stone was removed from the common bile duct.  Ultimately the patient is complaining today that she has had some increasing blood from her tube and some mild abdominal pain over the last couple of days but no vomiting no changes in bowel habits, able to eat without any significant issues.  Home Medications Prior to Admission medications   Medication Sig Start Date End Date Taking? Authorizing Provider  acetaminophen (TYLENOL) 650 MG CR tablet Take 650 mg by mouth every 8 (eight) hours as needed for pain.    [provider]  acidophilus (RISAQUAD) CAPS capsule Take 2 capsules by mouth daily. 09/15/22   Geradine Girt, DO  albuterol (PROVENTIL HFA;VENTOLIN HFA) 108 (90 BASE) MCG/ACT inhaler Inhale 2 puffs into the lungs 4 (four) times daily. (0800, 1200, 1600, & 2000)    [provider]   ALPRAZolam (XANAX) 1 MG tablet Take 0.5 tablets (0.5 mg total) by mouth 3 (three) times daily as needed for anxiety. (0800, 1400, & 2000) 09/15/22   Eulogio Bear U, DO  amoxicillin-clavulanate (AUGMENTIN) 875-125 MG tablet Take 1 tablet by mouth every 12 (twelve) hours. 09/15/22   Geradine Girt, DO  apixaban (ELIQUIS) 5 MG TABS tablet Take 1 tablet (5 mg total) by mouth 2 (two) times daily. 04/15/20   Orson Eva, MD  atorvastatin (LIPITOR) 10 MG tablet Take 1 tablet (10 mg total) by mouth daily at 8 pm. Resume on 04/22/20 if LFTs are back to normal Patient taking differently: Take 40 mg by mouth daily at 8 pm. 04/15/20   Tat, Shanon Brow, MD  benztropine (COGENTIN) 1 MG tablet Take 0.5 mg by mouth 2 (two) times daily. 04/10/20   [provider]  buPROPion (WELLBUTRIN XL) 150 MG 24 hr tablet Take 150 mg by mouth every morning. 03/11/20   [provider]  donepezil (ARICEPT) 10 MG tablet Take 10 mg by mouth daily at 8 pm.    [provider]  Ferrous Gluconate 324 (37.5 Fe) MG TABS Take 324 mg by mouth 2 (two) times daily. (0800 & 2000)    [provider]  fluticasone (FLONASE) 50 MCG/ACT nasal spray Place 1 spray into both nostrils daily. (0800)    [provider]  Fluticasone Furoate-Vilanterol (BREO ELLIPTA) 200-25 MCG/INH AEPB Inhale 1  puff into the lungs daily. (0800)    [provider]  furosemide (LASIX) 40 MG tablet Take 40 mg by mouth daily. (0800)    [provider]  gabapentin (NEURONTIN) 300 MG capsule Take 300 mg by mouth 3 (three) times daily. 11/14/19   [provider]  hydrocortisone 1 % ointment Apply 1 Application topically 3 (three) times daily. 06/21/22   [provider]  isosorbide mononitrate (IMDUR) 30 MG 24 hr tablet Take 0.5 tablets (15 mg total) by mouth daily. 09/15/22   Eulogio Bear U, DO  LATUDA 80 MG TABS tablet Take 40 mg by mouth at bedtime. 11/26/19   [provider]  loperamide (IMODIUM) 2 MG  capsule Take 1 capsule (2 mg total) by mouth 2 (two) times daily as needed for diarrhea or loose stools. 09/15/22   Geradine Girt, DO  memantine (NAMENDA) 10 MG tablet Take 10 mg by mouth 2 (two) times daily. 04/10/20   [provider]  Menthol-Zinc Oxide (RISAMINE) 0.44-20.625 % OINT Apply 1 application topically 4 (four) times daily as needed (for rash/redness).    [provider]  metoprolol succinate (TOPROL-XL) 25 MG 24 hr tablet Take 1 tablet (25 mg total) by mouth daily. 09/15/22   Geradine Girt, DO  Olopatadine HCl (PATADAY) 0.2 % SOLN Place 1 drop into both eyes daily. (0800)    [provider]  pantoprazole (PROTONIX) 40 MG tablet Take 1 tablet (40 mg total) by mouth 2 (two) times daily before a meal. 02/17/17   Rehman, Mechele Dawley, MD  potassium chloride (KLOR-CON) 10 MEQ tablet Take 1 tablet by mouth daily. 04/10/20   [provider]  tiZANidine (ZANAFLEX) 2 MG tablet Take 1 tablet (2 mg total) by mouth 2 (two) times daily. (0800 & 2000) 09/15/22   Geradine Girt, DO  traZODone (DESYREL) 100 MG tablet Take 100 mg by mouth at bedtime. 04/10/20   [provider]  venlafaxine XR (EFFEXOR-XR) 150 MG 24 hr capsule Take 300 mg by mouth daily. 04/10/20   [provider]      Allergies    Patient has no active allergies.    Review of Systems   Review of Systems  Gastrointestinal:  Positive for abdominal pain.  All other systems reviewed and are negative.   Physical Exam Updated Vital Signs BP 125/62   Pulse 65   Temp 98.3 F (36.8 C) (Oral)   Resp 16   Ht 1.702 m (5' 7"$ )   Wt 77.7 kg   SpO2 94%   BMI 26.83 kg/m  Physical Exam Vitals and nursing note reviewed.  Constitutional:      General: She is not in acute distress.    Appearance: She is well-developed.  HENT:     Head: Normocephalic and atraumatic.     Mouth/Throat:     Pharynx: No oropharyngeal exudate.  Eyes:     General: No scleral icterus.       Right eye: No  discharge.        Left eye: No discharge.     Conjunctiva/sclera: Conjunctivae normal.     Pupils: Pupils are equal, round, and reactive to light.  Neck:     Thyroid: No thyromegaly.     Vascular: No JVD.  Cardiovascular:     Rate and Rhythm: Normal rate and regular rhythm.     Heart sounds: Normal heart sounds. No murmur heard.    No friction rub. No gallop.  Pulmonary:  Effort: Pulmonary effort is normal. No respiratory distress.     Breath sounds: Normal breath sounds. No wheezing or rales.  Abdominal:     General: Bowel sounds are normal. There is no distension.     Palpations: Abdomen is soft. There is no mass.     Tenderness: There is abdominal tenderness.     Comments: Minimal tenderness right upper quadrant, tube in place at the anterolateral proximal aspect, draining bloody material  Musculoskeletal:        General: No tenderness. Normal range of motion.     Cervical back: Normal range of motion and neck supple.     Right lower leg: No edema.     Left lower leg: No edema.  Lymphadenopathy:     Cervical: No cervical adenopathy.  Skin:    General: Skin is warm and dry.     Findings: No erythema or rash.  Neurological:     Mental Status: She is alert.     Coordination: Coordination normal.  Psychiatric:        Behavior: Behavior normal.     ED Results / Procedures / Treatments   Labs (all labs ordered are listed, but only abnormal results are displayed) Labs Reviewed  CBC WITH DIFFERENTIAL/PLATELET - Abnormal; Notable for the following components:      Result Value   WBC 16.7 (*)    Neutro Abs 11.4 (*)    Monocytes Absolute 1.2 (*)    Abs Immature Granulocytes 0.08 (*)    All other components within normal limits  COMPREHENSIVE METABOLIC PANEL - Abnormal; Notable for the following components:   Potassium 2.9 (*)    All other components within normal limits  PROTIME-INR - Abnormal; Notable for the following components:   Prothrombin Time 17.7 (*)    INR  1.5 (*)    All other components within normal limits  LACTIC ACID, PLASMA  LACTIC ACID, PLASMA    EKG None  Radiology CT ABDOMEN PELVIS W CONTRAST  Result Date: 10/14/2022 CLINICAL DATA:  Abdominal pain. One month status post percutaneous cholecystostomy. EXAM: CT ABDOMEN AND PELVIS WITH CONTRAST TECHNIQUE: Multidetector CT imaging of the abdomen and pelvis was performed using the standard protocol following bolus administration of intravenous contrast. RADIATION DOSE REDUCTION: This exam was performed according to the departmental dose-optimization program which includes automated exposure control, adjustment of the mA and/or kV according to patient size and/or use of iterative reconstruction technique. CONTRAST:  13m OMNIPAQUE IOHEXOL 300 MG/ML  SOLN COMPARISON:  09/04/2022 FINDINGS: Lower Chest: No acute findings. Hepatobiliary: No hepatic masses identified. Percutaneous cholecystostomy tube is again seen in appropriate position within the gallbladder. No evidence of pericholecystic inflammatory changes or fluid collections. Mild diffuse biliary ductal dilatation remains stable. Pancreas:  No mass or inflammatory changes. Spleen: Within normal limits in size.  Stable small splenic cyst. Adrenals/Urinary Tract: No suspicious masses identified. No evidence of ureteral calculi or hydronephrosis. Unremarkable unopacified urinary bladder. Stomach/Bowel: No evidence of obstruction, inflammatory process or abnormal fluid collections. Normal appendix visualized. Diverticulosis is seen mainly involving the descending and sigmoid colon, however there is no evidence of diverticulitis. Vascular/Lymphatic: No pathologically enlarged lymph nodes. No acute vascular findings. Aortic atherosclerotic calcification incidentally noted. Reproductive:  No mass or other significant abnormality. Other:  None. Musculoskeletal:  No suspicious bone lesions identified. IMPRESSION: Percutaneous cholecystostomy tube in  appropriate position. No evidence of pericholecystic inflammatory changes or fluid collections. Stable mild diffuse biliary ductal dilatation. Colonic diverticulosis, without radiographic evidence of diverticulitis. Aortic Atherosclerosis (  ICD10-I70.0). Electronically Signed   By: Marlaine Hind M.D.   On: 10/14/2022 17:21    Procedures Procedures    Medications Ordered in ED Medications  morphine (PF) 4 MG/ML injection 4 mg (4 mg Intravenous Given 10/14/22 1603)  iohexol (OMNIPAQUE) 300 MG/ML solution 100 mL (100 mLs Intravenous Contrast Given 10/14/22 1703)    ED Course/ Medical Decision Making/ A&P                             Medical Decision Making Amount and/or Complexity of Data Reviewed Labs: ordered. Radiology: ordered.  Risk Prescription drug management.   This patient presents to the ED for concern of abdominal discomfort which is mild but also some blood in the IR drain, this involves an extensive number of treatment options, and is a complaint that carries with it a high risk of complications and morbidity.  The differential diagnosis includes misplacement of the drain, it may have come out of where it needed to be, there may be bleeding in the gallbladder   Co morbidities that complicate the patient evaluation  Dementia   Additional history obtained:  Additional history obtained from electronic medical record External records from outside source obtained and reviewed including prior hospitalization with IR procedures and ERCP   Lab Tests:  I Ordered, and personally interpreted labs.  The pertinent results include: CBC with mild leukocytosis, metabolic panel unremarkable   Imaging Studies ordered:  I ordered imaging studies including CT scan of the abdomen and pelvis I independently visualized and interpreted imaging which showed no inflammation around the gallbladder, no surgical findings, no abscesses, no free fluid or free air I agree with the radiologist  interpretation   Cardiac Monitoring: / EKG:  The patient was maintained on a cardiac monitor.  I personally viewed and interpreted the cardiac monitored which showed an underlying rhythm of: Normal sinus rhythm   Consultations Obtained:  I requested consultation with the general surgeon as well as gastroenterology, Dr. Gala Romney and Dr.  Okey Dupre,  and discussed lab and imaging findings as well as pertinent plan - they recommend: Outpatient follow-up with general surgery and interventional radiology to remove the tube, no need for inpatient treatment or surgery   Problem List / ED Course / Critical interventions / Medication management  The patient is done well, given a dose of pain medicine, vital signs remain normal I ordered medication including pain medicine for abdominal pain Reevaluation of the patient after these medicines showed that the patient improved I have reviewed the patients home medicines and have made adjustments as needed   Social Determinants of Health:  Mild dementia   Test / Admission - Considered:  Considered admission but after discussion with multiple consultants no indication for admission, patient patient stable for discharge         Final Clinical Impression(s) / ED Diagnoses Final diagnoses:  Generalized abdominal pain    Rx / DC Orders ED Discharge Orders     None         Noemi Chapel, MD 10/14/22 1747

## 2022-10-14 NOTE — Discharge Instructions (Signed)
You should follow-up with Dr. Arnoldo Morale this week.  He is the local general surgeon that saw you when you are in the hospital.  It is also getting very close for the people that put in your tube to take it out.  Please follow-up with them to make sure that they are able to do a study to make sure it is ready to be removed.  I am not concerned about a small amount of blood in the tubing, if this is a significant amount more bleeding or becomes heavy or you start to develop severe pain vomiting or fevers return to the ER immediately.

## 2022-10-14 NOTE — ED Triage Notes (Signed)
Pt arrived via [facility] from [facility], c/o abdominal pain 8/10, one month ago she had gallstone surgery, drain was placed and facility states the drain bag full of blood--they attempted to flush it and the bag was still filling up with blood

## 2022-10-14 NOTE — ED Notes (Signed)
Patient transported to CT 

## 2022-10-19 ENCOUNTER — Other Ambulatory Visit: Payer: Self-pay | Admitting: Radiology

## 2022-10-19 ENCOUNTER — Other Ambulatory Visit (HOSPITAL_COMMUNITY): Payer: Self-pay | Admitting: Interventional Radiology

## 2022-10-19 ENCOUNTER — Ambulatory Visit (HOSPITAL_COMMUNITY)
Admission: RE | Admit: 2022-10-19 | Discharge: 2022-10-19 | Disposition: A | Discharge status: Home / Self Care | Payer: 59 | Source: Ambulatory Visit | Attending: Radiology | Admitting: Radiology

## 2022-10-19 DIAGNOSIS — Z434 Encounter for attention to other artificial openings of digestive tract: Secondary | ICD-10-CM | POA: Insufficient documentation

## 2022-10-19 DIAGNOSIS — K810 Acute cholecystitis: Secondary | ICD-10-CM

## 2022-10-19 DIAGNOSIS — K819 Cholecystitis, unspecified: Secondary | ICD-10-CM

## 2022-10-19 HISTORY — PX: IR EXCHANGE BILIARY DRAIN: IMG6046

## 2022-10-19 MED ORDER — IOHEXOL 300 MG/ML  SOLN
50.0000 mL | Freq: Once | INTRAMUSCULAR | Status: AC | PRN
  Administered 2022-10-19: 10 mL

## 2022-10-19 MED ORDER — LIDOCAINE HCL 1 % IJ SOLN
INTRAMUSCULAR | Status: AC
  Filled 2022-10-19: qty 20

## 2022-10-19 NOTE — Procedures (Signed)
  Procedure:  Perc cholecystostomy catheter change   Preprocedure diagnosis: The encounter diagnosis was Acute cholecystitis.  Postprocedure diagnosis: same EBL:    minimal Complications:   none immediate  See full dictation in [facility].  Dillard Cannon MD Main # [phone] Pager  [phone] Mobile [phone]

## 2022-10-25 ENCOUNTER — Other Ambulatory Visit: Payer: Self-pay | Admitting: *Deleted

## 2022-10-25 NOTE — Patient Outreach (Signed)
[facility] Post- Acute Care Coordinator follow up. Kathleen Valenzuela resides in [place].  Verified in [facility] [name] transitioned to long term care.   No identifiable [facility] care coordination needs.   [name], MSN, RN,BSN [facility] Acute Care Coordinator [phone] (Direct dial)

## 2022-11-11 DIAGNOSIS — Q8789 Other specified congenital malformation syndromes, not elsewhere classified: Secondary | ICD-10-CM | POA: Diagnosis not present

## 2022-11-11 DIAGNOSIS — R627 Adult failure to thrive: Secondary | ICD-10-CM | POA: Diagnosis not present

## 2022-11-11 DIAGNOSIS — F03918 Unspecified dementia, unspecified severity, with other behavioral disturbance: Secondary | ICD-10-CM | POA: Diagnosis not present

## 2022-11-11 DIAGNOSIS — E785 Hyperlipidemia, unspecified: Secondary | ICD-10-CM | POA: Diagnosis not present

## 2022-11-11 DIAGNOSIS — I4891 Unspecified atrial fibrillation: Secondary | ICD-10-CM | POA: Diagnosis not present

## 2022-11-22 DIAGNOSIS — I509 Heart failure, unspecified: Secondary | ICD-10-CM | POA: Diagnosis not present

## 2022-11-22 DIAGNOSIS — I4891 Unspecified atrial fibrillation: Secondary | ICD-10-CM | POA: Diagnosis not present

## 2022-11-22 DIAGNOSIS — I700 Atherosclerosis of aorta: Secondary | ICD-10-CM | POA: Diagnosis not present

## 2022-12-14 ENCOUNTER — Ambulatory Visit (HOSPITAL_COMMUNITY)
Admission: RE | Admit: 2022-12-14 | Discharge: 2022-12-14 | Disposition: A | Discharge status: Home / Self Care | Payer: 59 | Source: Ambulatory Visit | Attending: Interventional Radiology | Admitting: Interventional Radiology

## 2022-12-14 DIAGNOSIS — Z434 Encounter for attention to other artificial openings of digestive tract: Secondary | ICD-10-CM | POA: Diagnosis not present

## 2022-12-14 DIAGNOSIS — K819 Cholecystitis, unspecified: Secondary | ICD-10-CM | POA: Diagnosis not present

## 2022-12-14 DIAGNOSIS — K831 Obstruction of bile duct: Secondary | ICD-10-CM | POA: Diagnosis not present

## 2022-12-14 HISTORY — PX: IR CATHETER TUBE CHANGE: IMG717

## 2022-12-14 HISTORY — PX: IR EXCHANGE BILIARY DRAIN: IMG6046

## 2022-12-14 MED ORDER — IOHEXOL 300 MG/ML  SOLN
50.0000 mL | Freq: Once | INTRAMUSCULAR | Status: AC | PRN
  Administered 2022-12-14: 15 mL

## 2022-12-14 MED ORDER — LIDOCAINE HCL 1 % IJ SOLN
INTRAMUSCULAR | Status: AC
  Filled 2022-12-14: qty 20

## 2022-12-14 NOTE — Procedures (Signed)
Interventional Radiology Procedure:   Indications: Cholecystostomy tube care.   Procedure: Cholecystostomy tube exchange  Findings: New 10 Fr drain in gallbladder.  Cystic duct is occluded.    Complications: None     EBL: Minimal  Plan: Routine exchange in 8 weeks.  Keep drain to gravity bag.   Yoanna Jurczyk R. Lowella Dandy, MD  Pager: [phone]

## 2022-12-19 ENCOUNTER — Other Ambulatory Visit (HOSPITAL_COMMUNITY): Payer: Self-pay | Admitting: Interventional Radiology

## 2022-12-19 ENCOUNTER — Encounter (HOSPITAL_COMMUNITY): Payer: Self-pay

## 2022-12-19 DIAGNOSIS — K819 Cholecystitis, unspecified: Secondary | ICD-10-CM

## 2022-12-19 DIAGNOSIS — B351 Tinea unguium: Secondary | ICD-10-CM | POA: Diagnosis not present

## 2022-12-19 DIAGNOSIS — I7091 Generalized atherosclerosis: Secondary | ICD-10-CM | POA: Diagnosis not present

## 2022-12-30 DIAGNOSIS — H524 Presbyopia: Secondary | ICD-10-CM | POA: Diagnosis not present

## 2022-12-30 DIAGNOSIS — H2513 Age-related nuclear cataract, bilateral: Secondary | ICD-10-CM | POA: Diagnosis not present

## 2022-12-30 DIAGNOSIS — H1045 Other chronic allergic conjunctivitis: Secondary | ICD-10-CM | POA: Diagnosis not present

## 2022-12-30 DIAGNOSIS — H04123 Dry eye syndrome of bilateral lacrimal glands: Secondary | ICD-10-CM | POA: Diagnosis not present

## 2023-01-18 DIAGNOSIS — R627 Adult failure to thrive: Secondary | ICD-10-CM | POA: Diagnosis not present

## 2023-01-18 DIAGNOSIS — K829 Disease of gallbladder, unspecified: Secondary | ICD-10-CM | POA: Diagnosis not present

## 2023-01-18 DIAGNOSIS — I5032 Chronic diastolic (congestive) heart failure: Secondary | ICD-10-CM | POA: Diagnosis not present

## 2023-01-18 DIAGNOSIS — K810 Acute cholecystitis: Secondary | ICD-10-CM | POA: Diagnosis not present

## 2023-01-18 DIAGNOSIS — A419 Sepsis, unspecified organism: Secondary | ICD-10-CM | POA: Diagnosis not present

## 2023-01-18 DIAGNOSIS — R5381 Other malaise: Secondary | ICD-10-CM | POA: Diagnosis not present

## 2023-02-06 DIAGNOSIS — R1314 Dysphagia, pharyngoesophageal phase: Secondary | ICD-10-CM | POA: Diagnosis not present

## 2023-02-07 DIAGNOSIS — R1314 Dysphagia, pharyngoesophageal phase: Secondary | ICD-10-CM | POA: Diagnosis not present

## 2023-02-08 ENCOUNTER — Other Ambulatory Visit (HOSPITAL_COMMUNITY): Payer: Self-pay | Admitting: Diagnostic Radiology

## 2023-02-08 ENCOUNTER — Other Ambulatory Visit (HOSPITAL_COMMUNITY): Payer: Self-pay | Admitting: Interventional Radiology

## 2023-02-08 ENCOUNTER — Ambulatory Visit (HOSPITAL_COMMUNITY)
Admission: RE | Admit: 2023-02-08 | Discharge: 2023-02-08 | Disposition: A | Discharge status: Home / Self Care | Payer: 59 | Source: Ambulatory Visit | Attending: Diagnostic Radiology | Admitting: Diagnostic Radiology

## 2023-02-08 DIAGNOSIS — Z434 Encounter for attention to other artificial openings of digestive tract: Secondary | ICD-10-CM | POA: Insufficient documentation

## 2023-02-08 DIAGNOSIS — K829 Disease of gallbladder, unspecified: Secondary | ICD-10-CM | POA: Diagnosis not present

## 2023-02-08 DIAGNOSIS — K810 Acute cholecystitis: Secondary | ICD-10-CM | POA: Insufficient documentation

## 2023-02-08 DIAGNOSIS — R1310 Dysphagia, unspecified: Secondary | ICD-10-CM | POA: Diagnosis not present

## 2023-02-08 DIAGNOSIS — R1314 Dysphagia, pharyngoesophageal phase: Secondary | ICD-10-CM | POA: Diagnosis not present

## 2023-02-08 DIAGNOSIS — R5381 Other malaise: Secondary | ICD-10-CM | POA: Diagnosis not present

## 2023-02-08 DIAGNOSIS — I509 Heart failure, unspecified: Secondary | ICD-10-CM | POA: Diagnosis not present

## 2023-02-08 DIAGNOSIS — K8011 Calculus of gallbladder with chronic cholecystitis with obstruction: Secondary | ICD-10-CM

## 2023-02-08 DIAGNOSIS — I4891 Unspecified atrial fibrillation: Secondary | ICD-10-CM | POA: Diagnosis not present

## 2023-02-08 DIAGNOSIS — Z466 Encounter for fitting and adjustment of urinary device: Secondary | ICD-10-CM | POA: Diagnosis not present

## 2023-02-08 DIAGNOSIS — I5032 Chronic diastolic (congestive) heart failure: Secondary | ICD-10-CM | POA: Diagnosis not present

## 2023-02-08 HISTORY — PX: IR EXCHANGE BILIARY DRAIN: IMG6046

## 2023-02-08 MED ORDER — LIDOCAINE HCL 1 % IJ SOLN
INTRAMUSCULAR | Status: AC
  Filled 2023-02-08: qty 20

## 2023-02-08 MED ORDER — IOHEXOL 300 MG/ML  SOLN
50.0000 mL | Freq: Once | INTRAMUSCULAR | Status: AC | PRN
  Administered 2023-02-08: 10 mL

## 2023-02-10 DIAGNOSIS — R1314 Dysphagia, pharyngoesophageal phase: Secondary | ICD-10-CM | POA: Diagnosis not present

## 2023-02-11 DIAGNOSIS — R1314 Dysphagia, pharyngoesophageal phase: Secondary | ICD-10-CM | POA: Diagnosis not present

## 2023-02-13 DIAGNOSIS — R1314 Dysphagia, pharyngoesophageal phase: Secondary | ICD-10-CM | POA: Diagnosis not present

## 2023-02-14 ENCOUNTER — Encounter: Payer: Self-pay | Admitting: General Surgery

## 2023-02-14 ENCOUNTER — Ambulatory Visit (INDEPENDENT_AMBULATORY_CARE_PROVIDER_SITE_OTHER): Payer: 59 | Admitting: General Surgery

## 2023-02-14 VITALS — BP 128/74 | HR 70 | Temp 98.5°F | Resp 16 | Ht 67.0 in | Wt 170.0 lb

## 2023-02-14 DIAGNOSIS — K810 Acute cholecystitis: Secondary | ICD-10-CM

## 2023-02-14 DIAGNOSIS — R1314 Dysphagia, pharyngoesophageal phase: Secondary | ICD-10-CM | POA: Diagnosis not present

## 2023-02-14 NOTE — Progress Notes (Signed)
Subjective:     Kathleen Valenzuela  Patient presents here for follow-up of cholecystostomy tube.  Patient has an extensive medical history.  I first saw her in December 2023.  She had acute cholecystitis with choledocholithiasis.  At that time, an ERCP with stone extraction and cholecystostomy tube placement was done as she was not stable enough for surgical intervention.  She currently lives in a nursing home.  She has been eating well.  She just had her cholecystostomy tube changed on 02/08/2023.  She has intermittent pain at the exit site.  She denies any fever, chills, or jaundice.  Her history is somewhat limited secondary to her bipolar disorder and dementia.  Nobody is with the patient.  She is on Eliquis for her atrial fibrillation. Objective:    BP 128/74   Pulse 70   Temp 98.5 F (36.9 C) (Oral)   Resp 16   Ht 5\' 7"  (1.702 m)   Wt 170 lb (77.1 kg)   SpO2 92%   BMI 26.63 kg/m   General:  alert, cooperative, and no distress  Ambulates with a walker Head is normocephalic, atraumatic Lungs clear to auscultation with equal breath sounds bilaterally Heart examination reveals an irregular rhythm. Abdomen is soft.  A cholecystostomy tube is present in the right upper quadrant but appears to be dislodged as the sutures to the skin are free.  There is only serosanguineous drainage in the tube.  The tube was thus removed.     Assessment:    History of acute cholecystitis secondary to cholelithiasis, choledocholithiasis.  Status post percutaneous cholecystostomy tube placement in the past.  It has become dislodged and thus removed.    Plan:   No need for acute surgical intervention at the present time.  Patient is at higher risk for surgical intervention given her multiple comorbidities.  Will see how she does with the cholecystostomy tube removed.  Should she become symptomatic, she should be taken to the emergency room for further evaluation and treatment.  Follow-up here as needed.

## 2023-02-15 ENCOUNTER — Emergency Department (HOSPITAL_COMMUNITY)
Admission: EM | Admit: 2023-02-15 | Discharge: 2023-02-15 | Disposition: A | Discharge status: Home / Self Care | Payer: 59 | Attending: Emergency Medicine | Admitting: Emergency Medicine

## 2023-02-15 ENCOUNTER — Emergency Department (HOSPITAL_COMMUNITY): Payer: 59

## 2023-02-15 ENCOUNTER — Telehealth: Payer: Self-pay | Admitting: *Deleted

## 2023-02-15 ENCOUNTER — Encounter (HOSPITAL_COMMUNITY): Payer: Self-pay

## 2023-02-15 ENCOUNTER — Other Ambulatory Visit: Payer: Self-pay

## 2023-02-15 DIAGNOSIS — R1084 Generalized abdominal pain: Secondary | ICD-10-CM

## 2023-02-15 DIAGNOSIS — Z7901 Long term (current) use of anticoagulants: Secondary | ICD-10-CM | POA: Insufficient documentation

## 2023-02-15 DIAGNOSIS — I5030 Unspecified diastolic (congestive) heart failure: Secondary | ICD-10-CM | POA: Insufficient documentation

## 2023-02-15 DIAGNOSIS — J449 Chronic obstructive pulmonary disease, unspecified: Secondary | ICD-10-CM | POA: Diagnosis not present

## 2023-02-15 DIAGNOSIS — Z87891 Personal history of nicotine dependence: Secondary | ICD-10-CM | POA: Insufficient documentation

## 2023-02-15 DIAGNOSIS — E876 Hypokalemia: Secondary | ICD-10-CM

## 2023-02-15 DIAGNOSIS — F0280 Dementia in other diseases classified elsewhere without behavioral disturbance: Secondary | ICD-10-CM | POA: Insufficient documentation

## 2023-02-15 DIAGNOSIS — Z79899 Other long term (current) drug therapy: Secondary | ICD-10-CM | POA: Insufficient documentation

## 2023-02-15 DIAGNOSIS — G309 Alzheimer's disease, unspecified: Secondary | ICD-10-CM | POA: Insufficient documentation

## 2023-02-15 DIAGNOSIS — R1314 Dysphagia, pharyngoesophageal phase: Secondary | ICD-10-CM | POA: Diagnosis not present

## 2023-02-15 DIAGNOSIS — I110 Hypertensive heart disease with heart failure: Secondary | ICD-10-CM | POA: Diagnosis not present

## 2023-02-15 DIAGNOSIS — I10 Essential (primary) hypertension: Secondary | ICD-10-CM | POA: Diagnosis not present

## 2023-02-15 DIAGNOSIS — R109 Unspecified abdominal pain: Secondary | ICD-10-CM | POA: Diagnosis present

## 2023-02-15 DIAGNOSIS — K5730 Diverticulosis of large intestine without perforation or abscess without bleeding: Secondary | ICD-10-CM | POA: Diagnosis not present

## 2023-02-15 LAB — LIPASE, BLOOD: Lipase: 32 U/L (ref 11–51)

## 2023-02-15 LAB — CBC WITH DIFFERENTIAL/PLATELET
Abs Immature Granulocytes: 0.06 10*3/uL (ref 0.00–0.07)
Basophils Absolute: 0.1 10*3/uL (ref 0.0–0.1)
Basophils Relative: 1 %
Eosinophils Absolute: 0.2 10*3/uL (ref 0.0–0.5)
Eosinophils Relative: 1 %
HCT: 39.5 % (ref 36.0–46.0)
Hemoglobin: 13.7 g/dL (ref 12.0–15.0)
Immature Granulocytes: 0 %
Lymphocytes Relative: 13 %
Lymphs Abs: 1.9 10*3/uL (ref 0.7–4.0)
MCH: 30.4 pg (ref 26.0–34.0)
MCHC: 34.7 g/dL (ref 30.0–36.0)
MCV: 87.6 fL (ref 80.0–100.0)
Monocytes Absolute: 0.7 10*3/uL (ref 0.1–1.0)
Monocytes Relative: 5 %
Neutro Abs: 11.4 10*3/uL — ABNORMAL HIGH (ref 1.7–7.7)
Neutrophils Relative %: 80 %
Platelets: 240 10*3/uL (ref 150–400)
RBC: 4.51 MIL/uL (ref 3.87–5.11)
RDW: 14.1 % (ref 11.5–15.5)
WBC: 14.3 10*3/uL — ABNORMAL HIGH (ref 4.0–10.5)
nRBC: 0 % (ref 0.0–0.2)

## 2023-02-15 LAB — COMPREHENSIVE METABOLIC PANEL
ALT: 12 U/L (ref 0–44)
AST: 18 U/L (ref 15–41)
Albumin: 3.8 g/dL (ref 3.5–5.0)
Alkaline Phosphatase: 100 U/L (ref 38–126)
Anion gap: 15 (ref 5–15)
BUN: 11 mg/dL (ref 8–23)
CO2: 25 mmol/L (ref 22–32)
Calcium: 9.1 mg/dL (ref 8.9–10.3)
Chloride: 99 mmol/L (ref 98–111)
Creatinine, Ser: 0.94 mg/dL (ref 0.44–1.00)
GFR, Estimated: >60 mL/min (ref >60)
Glucose, Bld: 152 mg/dL — ABNORMAL HIGH (ref 70–99)
Potassium: 2.7 mmol/L — CL (ref 3.5–5.1)
Sodium: 139 mmol/L (ref 135–145)
Total Bilirubin: 0.6 mg/dL (ref 0.3–1.2)
Total Protein: 7.6 g/dL (ref 6.5–8.1)

## 2023-02-15 MED ORDER — POTASSIUM CHLORIDE 10 MEQ/100ML IV SOLN
10.0000 meq | INTRAVENOUS | Status: AC
  Administered 2023-02-15 (×2): 10 meq via INTRAVENOUS
  Filled 2023-02-15 (×2): qty 100

## 2023-02-15 MED ORDER — ONDANSETRON HCL 4 MG/2ML IJ SOLN
4.0000 mg | Freq: Once | INTRAMUSCULAR | Status: AC
  Administered 2023-02-15: 4 mg via INTRAVENOUS
  Filled 2023-02-15: qty 2

## 2023-02-15 MED ORDER — ONDANSETRON HCL 4 MG/2ML IJ SOLN
4.0000 mg | Freq: Once | INTRAMUSCULAR | Status: AC | PRN
  Administered 2023-02-15: 4 mg via INTRAVENOUS
  Filled 2023-02-15: qty 2

## 2023-02-15 MED ORDER — HYDROMORPHONE HCL 1 MG/ML IJ SOLN
0.5000 mg | Freq: Once | INTRAMUSCULAR | Status: AC
  Administered 2023-02-15: 0.5 mg via INTRAVENOUS
  Filled 2023-02-15: qty 0.5

## 2023-02-15 MED ORDER — IOHEXOL 300 MG/ML  SOLN
100.0000 mL | Freq: Once | INTRAMUSCULAR | Status: AC | PRN
  Administered 2023-02-15: 100 mL via INTRAVENOUS

## 2023-02-15 MED ORDER — HYDROMORPHONE HCL 1 MG/ML IJ SOLN: 1.0000 mg | Freq: Once | INTRAMUSCULAR | Status: DC

## 2023-02-15 MED ORDER — POTASSIUM CHLORIDE CRYS ER 20 MEQ PO TBCR: 20.0000 meq | EXTENDED_RELEASE_TABLET | Freq: Two times a day (BID) | ORAL | 0 refills | Status: AC

## 2023-02-15 NOTE — Discharge Instructions (Addendum)
As discussed with the son patient should follow-up with Dr. Lovell Sheehan definitely get seen for fever abdominal pain persistent vomiting.  Today's workup without any acute problems with the gallbladder with the cholecystostomy tube out.  Potassium here today was a little low at 2.7 patient received 20 mill colons of potassium IV.  Would recommend taking some potassium supplement for the next 2 days.  Prescription provided.  Also blood pressure here was elevated so that will need to be followed at the nursing facility.

## 2023-02-15 NOTE — ED Notes (Signed)
Report called to [facility] rehab and given to [place].

## 2023-02-15 NOTE — ED Triage Notes (Addendum)
Pt brought in by [facility] for abdominal pain/ back pain starting today. Pt had a gallbladder drainage tube removed yesterday that has been in place since December per son. CBG 134. Pt states a 10/10 abdominal pain. Pt states able to have normal BM but is feeling nauseous with no vomiting or diarrhea.

## 2023-02-15 NOTE — ED Provider Notes (Addendum)
[place] EMERGENCY DEPARTMENT AT [facility] Provider Note   CSN: [id number] Arrival date & time: 02/15/23  1630     History  Chief Complaint  Patient presents with   Abdominal Pain    Kathleen Valenzuela is a 72 y.o. female.  Patient just seen by Dr. Lovell Sheehan in the office yesterday.  Patient has had a cholecystostomy tube in place for several months.  Had it changed June 5.  Seen in the office and it was noted that the tube had become displaced so he removed it.  Patient was told to come to the emergency department for any symptoms.  Patient complaint with nausea and right-sided abdominal pain that does radiate over to the left.  His notes state that patient has an extensive medical history for saw her on December 2023 she had acute cholecystitis with choledocholithiasis at that time an ERCP with stone extraction and cholecystostomy tube placement was done as she was not stable enough for surgical intervention she currently lives in a nursing home.  She has been been eating well as of yesterday she had her cholecystostomy tube changed on June 5 she has intermittent pain at the exit site she denies any fever chills or jaundice her history is somewhat limited secondary to history of bipolar disorder and dementia.  Sweat note from yesterday says.  Brought in by [facility].  Past medical history significant for duodenal stricture bipolar disorder gastroesophageal reflux disease COPD hypertension chronic back pain early Alzheimer's dementia and diastolic dysfunction with some heart failure.  Patient is a former smoker quit in November 2016.       Home Medications Prior to Admission medications   Medication Sig Start Date End Date Taking? Authorizing Provider  acetaminophen (TYLENOL) 650 MG CR tablet Take 650 mg by mouth every 8 (eight) hours as needed for pain.    [provider]  acidophilus (RISAQUAD) CAPS capsule Take 2 capsules by mouth daily. 09/15/22   Joseph Art, DO  albuterol (PROVENTIL HFA;VENTOLIN HFA) 108 (90 BASE) MCG/ACT inhaler Inhale 2 puffs into the lungs 4 (four) times daily. (0800, 1200, 1600, & 2000)    [provider]  ALPRAZolam (XANAX) 1 MG tablet Take 0.5 tablets (0.5 mg total) by mouth 3 (three) times daily as needed for anxiety. (0800, 1400, & 2000) 09/15/22   Benjamine Mola, Selinda Orion, DO  apixaban (ELIQUIS) 5 MG TABS tablet Take 1 tablet (5 mg total) by mouth 2 (two) times daily. 04/15/20   Catarina Hartshorn, MD  atorvastatin (LIPITOR) 10 MG tablet Take 1 tablet (10 mg total) by mouth daily at 8 pm. Resume on 04/22/20 if LFTs are back to normal Patient taking differently: Take 40 mg by mouth daily at 8 pm. 04/15/20   Tat, Onalee Hua, MD  benztropine (COGENTIN) 1 MG tablet Take 0.5 mg by mouth 2 (two) times daily. 04/10/20   [provider]  buPROPion (WELLBUTRIN XL) 150 MG 24 hr tablet Take 150 mg by mouth every morning. 03/11/20   [provider]  donepezil (ARICEPT) 10 MG tablet Take 10 mg by mouth daily at 8 pm.    [provider]  Ferrous Gluconate 324 (37.5 Fe) MG TABS Take 324 mg by mouth 2 (two) times daily. (0800 & 2000)    [provider]  fluticasone (FLONASE) 50 MCG/ACT nasal spray Place 1 spray into both nostrils daily. (0800)    [provider]  Fluticasone Furoate-Vilanterol (BREO ELLIPTA) 200-25 MCG/INH AEPB Inhale 1 puff  into the lungs daily. (0800)    [provider]  furosemide (LASIX) 40 MG tablet Take 40 mg by mouth daily. (0800)    [provider]  gabapentin (NEURONTIN) 300 MG capsule Take 300 mg by mouth 3 (three) times daily. 11/14/19   [provider]  hydrocortisone 1 % ointment Apply 1 Application topically 3 (three) times daily. 06/21/22   [provider]  isosorbide mononitrate (IMDUR) 30 MG 24 hr tablet Take 0.5 tablets (15 mg total) by mouth daily. 09/15/22   Marlin Canary U, DO  LATUDA 80 MG TABS tablet Take 40 mg by mouth at bedtime.  11/26/19   [provider]  loperamide (IMODIUM) 2 MG capsule Take 1 capsule (2 mg total) by mouth 2 (two) times daily as needed for diarrhea or loose stools. 09/15/22   Joseph Art, DO  memantine (NAMENDA) 10 MG tablet Take 10 mg by mouth 2 (two) times daily. 04/10/20   [provider]  Menthol-Zinc Oxide (RISAMINE) 0.44-20.625 % OINT Apply 1 application topically 4 (four) times daily as needed (for rash/redness).    [provider]  metoprolol succinate (TOPROL-XL) 25 MG 24 hr tablet Take 1 tablet (25 mg total) by mouth daily. 09/15/22   Joseph Art, DO  Olopatadine HCl (PATADAY) 0.2 % SOLN Place 1 drop into both eyes daily. (0800)    [provider]  pantoprazole (PROTONIX) 40 MG tablet Take 1 tablet (40 mg total) by mouth 2 (two) times daily before a meal. 02/17/17   Rehman, Joline Maxcy, MD  potassium chloride (KLOR-CON) 10 MEQ tablet Take 1 tablet by mouth daily. 04/10/20   [provider]  tiZANidine (ZANAFLEX) 2 MG tablet Take 1 tablet (2 mg total) by mouth 2 (two) times daily. (0800 & 2000) 09/15/22   Joseph Art, DO  traZODone (DESYREL) 100 MG tablet Take 100 mg by mouth at bedtime. 04/10/20   [provider]  venlafaxine XR (EFFEXOR-XR) 150 MG 24 hr capsule Take 300 mg by mouth daily. 04/10/20   [provider]      Allergies    Patient has no active allergies.    Review of Systems   Review of Systems  Constitutional:  Negative for chills and fever.  HENT:  Negative for ear pain and sore throat.   Eyes:  Negative for pain and visual disturbance.  Respiratory:  Negative for cough and shortness of breath.   Cardiovascular:  Negative for chest pain and palpitations.  Gastrointestinal:  Positive for abdominal pain and nausea. Negative for vomiting.  Genitourinary:  Negative for dysuria and hematuria.  Musculoskeletal:  Negative for arthralgias and back pain.  Skin:  Negative for color change and rash.  Neurological:  Negative  for seizures and syncope.  Hematological:  Bruises/bleeds easily.  Psychiatric/Behavioral:  Positive for confusion.   All other systems reviewed and are negative.   Physical Exam Updated Vital Signs BP (!) 181/72   Pulse (!) 56   Temp 97.7 F (36.5 C) (Oral)   Resp 14   Ht 1.702 m (5\' 7" )   Wt 77.1 kg   SpO2 95%   BMI 26.63 kg/m  Physical Exam Vitals and nursing note reviewed.  Constitutional:      General: She is not in acute distress.    Appearance: She is well-developed.  HENT:     Head: Normocephalic and atraumatic.     Mouth/Throat:     Mouth: Mucous membranes are moist.  Eyes:  Conjunctiva/sclera: Conjunctivae normal.  Cardiovascular:     Rate and Rhythm: Normal rate and regular rhythm.     Heart sounds: No murmur heard. Pulmonary:     Effort: Pulmonary effort is normal. No respiratory distress.     Breath sounds: Normal breath sounds.  Abdominal:     Palpations: Abdomen is soft.     Tenderness: There is no abdominal tenderness. There is no guarding.     Comments: Bandage over cholecystostomy tube site no erythema.  No significant tenderness to palpation no guarding.  Musculoskeletal:        General: No swelling.     Cervical back: Neck supple.  Skin:    General: Skin is warm and dry.     Capillary Refill: Capillary refill takes less than 2 seconds.  Neurological:     General: No focal deficit present.     Mental Status: She is alert. Mental status is at baseline.  Psychiatric:        Mood and Affect: Mood normal.     ED Results / Procedures / Treatments   Labs (all labs ordered are listed, but only abnormal results are displayed) Labs Reviewed  CBC WITH DIFFERENTIAL/PLATELET - Abnormal; Notable for the following components:      Result Value   WBC 14.3 (*)    Neutro Abs 11.4 (*)    All other components within normal limits  LIPASE, BLOOD  COMPREHENSIVE METABOLIC PANEL  URINALYSIS, ROUTINE W REFLEX MICROSCOPIC    EKG None  Radiology No  results found.  Procedures Procedures    Medications Ordered in ED Medications  ondansetron (ZOFRAN) injection 4 mg (4 mg Intravenous Given 02/15/23 1657)    ED Course/ Medical Decision Making/ A&P                             Medical Decision Making Amount and/or Complexity of Data Reviewed Labs: ordered. Radiology: ordered.  Risk Prescription drug management.   Patient just had cholecystostomy tube removed yesterday apparently it was dislodged anyways.  Now with complaint of nausea no vomiting and abdominal pain mostly right-sided but it does radiate to the left.  Will get CBC lipase complete metabolic panel get CT scan with IV contrast will give some pain medicine and antinausea medicine.  Patient's heart rate was 58 blood pressure 179/73 temp was 97.7 oxygen saturation is 97% on room air.  Workup without acute findings other than a white count of 14.3.  Lipase normal liver function test normal.  Electrolytes normal except for potassium of 2.7.  Patient received 2 rounds of IV potassium here.  Will have her take some oral potassium for the next few days because her renal function is normal.  The CT scan without any significant findings discussed with on-call surgeon Dr. Robyne Peers.  She felt patient was stable at this time for discharge.  Updated her son on the findings and the recommendations from surgery.  Patient will also need her blood pressure followed up.   Final Clinical Impression(s) / ED Diagnoses Final diagnoses:  Generalized abdominal pain    Rx / DC Orders ED Discharge Orders     None         Vanetta Mulders, MD 02/15/23 1729    Vanetta Mulders, MD 02/15/23 2229

## 2023-02-15 NOTE — Telephone Encounter (Signed)
Received call from SN at [facility].   Reports that patient is voicing C/O severe abdominal pain where drain was located and increased BP at 160/83. Reports that patient is requesting transport to ER for evaluation.   Dr. Lovell Sheehan to be made aware.

## 2023-02-15 NOTE — ED Notes (Signed)
Patient transported to CT 

## 2023-02-15 NOTE — ED Notes (Signed)
Pt son said he wants EMS to take pt back to [facility] rehab, he could not do it.

## 2023-02-16 DIAGNOSIS — R1314 Dysphagia, pharyngoesophageal phase: Secondary | ICD-10-CM | POA: Diagnosis not present

## 2023-02-20 DIAGNOSIS — R1084 Generalized abdominal pain: Secondary | ICD-10-CM | POA: Diagnosis not present

## 2023-02-20 DIAGNOSIS — R509 Fever, unspecified: Secondary | ICD-10-CM | POA: Diagnosis not present

## 2023-02-20 DIAGNOSIS — R079 Chest pain, unspecified: Secondary | ICD-10-CM | POA: Diagnosis not present

## 2023-02-20 DIAGNOSIS — R160 Hepatomegaly, not elsewhere classified: Secondary | ICD-10-CM | POA: Diagnosis not present

## 2023-02-21 DIAGNOSIS — R5381 Other malaise: Secondary | ICD-10-CM | POA: Diagnosis not present

## 2023-02-21 DIAGNOSIS — I5032 Chronic diastolic (congestive) heart failure: Secondary | ICD-10-CM | POA: Diagnosis not present

## 2023-02-21 DIAGNOSIS — K811 Chronic cholecystitis: Secondary | ICD-10-CM | POA: Diagnosis not present

## 2023-02-21 DIAGNOSIS — K829 Disease of gallbladder, unspecified: Secondary | ICD-10-CM | POA: Diagnosis not present

## 2023-02-22 ENCOUNTER — Telehealth: Payer: Self-pay | Admitting: Family Medicine

## 2023-02-22 NOTE — Telephone Encounter (Signed)
Amy from [facility] facility called and states that the place where the tube was removed looks infected. She states that she is having brown drainage with no fever but her WBC was elevated 2 days ago.  Per Dr. Lovell Sheehan will cover her with Augmentin 875mg  bid x 7 days. If she is not better after treatment then we need to see her in office.   [facility] rehab and gave verbal order to [facility] for the antibx and provider recommendations.

## 2023-02-23 DIAGNOSIS — I10 Essential (primary) hypertension: Secondary | ICD-10-CM | POA: Diagnosis not present

## 2023-03-01 DIAGNOSIS — K829 Disease of gallbladder, unspecified: Secondary | ICD-10-CM | POA: Diagnosis not present

## 2023-03-01 DIAGNOSIS — R627 Adult failure to thrive: Secondary | ICD-10-CM | POA: Diagnosis not present

## 2023-03-01 DIAGNOSIS — I5032 Chronic diastolic (congestive) heart failure: Secondary | ICD-10-CM | POA: Diagnosis not present

## 2023-03-01 DIAGNOSIS — G8929 Other chronic pain: Secondary | ICD-10-CM | POA: Diagnosis not present

## 2023-03-01 DIAGNOSIS — R5381 Other malaise: Secondary | ICD-10-CM | POA: Diagnosis not present

## 2023-03-06 DIAGNOSIS — R5381 Other malaise: Secondary | ICD-10-CM | POA: Diagnosis not present

## 2023-03-06 DIAGNOSIS — I5032 Chronic diastolic (congestive) heart failure: Secondary | ICD-10-CM | POA: Diagnosis not present

## 2023-03-06 DIAGNOSIS — K811 Chronic cholecystitis: Secondary | ICD-10-CM | POA: Diagnosis not present

## 2023-03-06 DIAGNOSIS — K829 Disease of gallbladder, unspecified: Secondary | ICD-10-CM | POA: Diagnosis not present

## 2023-03-06 DIAGNOSIS — I509 Heart failure, unspecified: Secondary | ICD-10-CM | POA: Diagnosis not present

## 2023-03-06 DIAGNOSIS — E876 Hypokalemia: Secondary | ICD-10-CM | POA: Diagnosis not present

## 2023-03-06 DIAGNOSIS — I4891 Unspecified atrial fibrillation: Secondary | ICD-10-CM | POA: Diagnosis not present

## 2023-03-06 DIAGNOSIS — R627 Adult failure to thrive: Secondary | ICD-10-CM | POA: Diagnosis not present

## 2023-03-07 DIAGNOSIS — G309 Alzheimer's disease, unspecified: Secondary | ICD-10-CM | POA: Diagnosis not present

## 2023-03-07 DIAGNOSIS — I4891 Unspecified atrial fibrillation: Secondary | ICD-10-CM | POA: Diagnosis not present

## 2023-03-14 DIAGNOSIS — I10 Essential (primary) hypertension: Secondary | ICD-10-CM | POA: Diagnosis not present

## 2023-03-20 DIAGNOSIS — R5381 Other malaise: Secondary | ICD-10-CM | POA: Diagnosis not present

## 2023-03-20 DIAGNOSIS — E876 Hypokalemia: Secondary | ICD-10-CM | POA: Diagnosis not present

## 2023-03-22 DIAGNOSIS — B351 Tinea unguium: Secondary | ICD-10-CM | POA: Diagnosis not present

## 2023-03-22 DIAGNOSIS — I7091 Generalized atherosclerosis: Secondary | ICD-10-CM | POA: Diagnosis not present

## 2023-03-27 DIAGNOSIS — E876 Hypokalemia: Secondary | ICD-10-CM | POA: Diagnosis not present

## 2023-04-10 ENCOUNTER — Other Ambulatory Visit (HOSPITAL_COMMUNITY): Payer: 59

## 2023-04-17 ENCOUNTER — Ambulatory Visit (HOSPITAL_COMMUNITY): Admission: RE | Admit: 2023-04-17 | Payer: 59 | Source: Ambulatory Visit

## 2023-04-17 DIAGNOSIS — I10 Essential (primary) hypertension: Secondary | ICD-10-CM | POA: Diagnosis not present

## 2023-04-27 ENCOUNTER — Telehealth: Payer: Self-pay | Admitting: *Deleted

## 2023-04-27 NOTE — Telephone Encounter (Signed)
Patient is currently residing in [facility] [phone]~ telephone.   Amy with facility transportation stopped by office to discuss patient care. Amy reports that patient continues to have excess of drainage from prior chole tube site. Reports that drainage is enough that it is seeping through dressing and staining clothing.   Call placed to facility for more information. Discussed case with Baxter Hire, nurse. Reports that she does not routinely see the drainage or the wound as this is cared for by the facility wound care nurse. Wounds care nurse is unavailable at this time. States that what she has observed of the drainage is thin, yellow tinted fluid with some tint to brown at times. Reports that she has not noted any foul odor to the drainage.  States that facility NP started patient on Doxycycline 100 mg PO every day with no end date for inflammation and infection on 04/20/2023.  States that patient has no complaints of fever, abdominal pain, or appetite changes.   Again advised that Dr. Lovell Sheehan recommended that patient go to ER if patient is symptomatic.   No further IR appointments have been completed as chole drain was removed on 02/14/2023.  Please advise.

## 2023-04-27 NOTE — Telephone Encounter (Signed)
Discussed with Dr. Lovell Sheehan. Recommends that patient continue current treatment as long as she is not experiencing any abdominal pain.   Call placed to facility. Unable to reach nursing staff at this time.

## 2023-04-28 NOTE — Telephone Encounter (Signed)
Call placed to facility to discuss care with floor nurse.   Unable to reach nurse at this time. Requested return call.

## 2023-05-01 NOTE — Telephone Encounter (Signed)
Call placed to facility to discuss care with floor nurse and charge nurse.    Unable to reach at this time. Requested return call.

## 2023-05-02 NOTE — Telephone Encounter (Signed)
Received return call from [place], DON for [facility] [phone], [phone].   Call returned to facility and spoke with Judeth Cornfield, assistant DON. Gave provider recommendations. Advised to contact office if needed.

## 2023-06-06 DIAGNOSIS — I4891 Unspecified atrial fibrillation: Secondary | ICD-10-CM | POA: Diagnosis not present

## 2023-06-06 DIAGNOSIS — I509 Heart failure, unspecified: Secondary | ICD-10-CM | POA: Diagnosis not present

## 2023-06-06 DIAGNOSIS — E785 Hyperlipidemia, unspecified: Secondary | ICD-10-CM | POA: Diagnosis not present

## 2023-06-06 DIAGNOSIS — R1111 Vomiting without nausea: Secondary | ICD-10-CM | POA: Diagnosis not present

## 2023-06-06 DIAGNOSIS — R1310 Dysphagia, unspecified: Secondary | ICD-10-CM | POA: Diagnosis not present

## 2023-06-14 DIAGNOSIS — B351 Tinea unguium: Secondary | ICD-10-CM | POA: Diagnosis not present

## 2023-06-14 DIAGNOSIS — I7091 Generalized atherosclerosis: Secondary | ICD-10-CM | POA: Diagnosis not present

## 2023-06-15 ENCOUNTER — Ambulatory Visit (INDEPENDENT_AMBULATORY_CARE_PROVIDER_SITE_OTHER): Payer: 59 | Admitting: Gastroenterology

## 2023-06-15 ENCOUNTER — Encounter (INDEPENDENT_AMBULATORY_CARE_PROVIDER_SITE_OTHER): Payer: Self-pay | Admitting: Gastroenterology

## 2023-06-15 VITALS — BP 134/82 | HR 86 | Temp 97.1°F | Ht 65.0 in | Wt 186.2 lb

## 2023-06-15 DIAGNOSIS — Z8719 Personal history of other diseases of the digestive system: Secondary | ICD-10-CM

## 2023-06-15 DIAGNOSIS — R1319 Other dysphagia: Secondary | ICD-10-CM

## 2023-06-15 DIAGNOSIS — R1310 Dysphagia, unspecified: Secondary | ICD-10-CM

## 2023-06-15 DIAGNOSIS — K219 Gastro-esophageal reflux disease without esophagitis: Secondary | ICD-10-CM

## 2023-06-15 DIAGNOSIS — Z8669 Personal history of other diseases of the nervous system and sense organs: Secondary | ICD-10-CM

## 2023-06-15 DIAGNOSIS — K8050 Calculus of bile duct without cholangitis or cholecystitis without obstruction: Secondary | ICD-10-CM

## 2023-06-15 MED ORDER — URSODIOL 500 MG PO TABS
500.0000 mg | ORAL_TABLET | Freq: Two times a day (BID) | ORAL | 3 refills | Status: DC
Start: 2023-06-15 — End: 2023-06-15

## 2023-06-15 MED ORDER — OMEPRAZOLE 40 MG PO CPDR
40.0000 mg | DELAYED_RELEASE_CAPSULE | Freq: Every day | ORAL | 3 refills | Status: AC
Start: 2023-06-15 — End: ?

## 2023-06-15 MED ORDER — URSODIOL 500 MG PO TABS
500.0000 mg | ORAL_TABLET | Freq: Two times a day (BID) | ORAL | 3 refills | Status: AC
Start: 2023-06-15 — End: ?

## 2023-06-15 MED ORDER — OMEPRAZOLE 40 MG PO CPDR
40.0000 mg | DELAYED_RELEASE_CAPSULE | Freq: Every day | ORAL | 3 refills | Status: DC
Start: 2023-06-15 — End: 2023-06-15

## 2023-06-15 NOTE — Progress Notes (Signed)
Kathleen Valenzuela, M.D. Gastroenterology & Hepatology [facility] Gastroenterology [address] [place], [place] [address] Primary Care Physician: Kirstie Peri, MD [address] [place] [address]  Referring MD: PCP  Chief Complaint:  dysphagia  History of Present Illness: Kathleen Valenzuela is a 72 y.o. female with PMH bipolar disorder, COPD, afib,  diastolic heart failure, duodenal stricture, Alzheimer's dementia, atherosclerosis, anxiety, hypertension, hyperlipidemia, GERD complicated by esophagitis and esophageal stricture, who presents for evaluation of dysphagia.  Patient is a poor historian and comes alone to the appointment. Reports that for the last couple of years she has had issues with dysphagia when eating bread, milk or when she drinks water too fast. She states that she feels the food is not going down smoothly and she has presented recurrent choking episodes. She also reports having intermittent heartburn possibly once a week. She reports that she is supposed to be taking pantoprazole daily but this is not listed in her nursing home medications.  States there are some weeks when she has nausea but does not have vomiting episodes.  Reports that once every 2 weeks she may have some diarrhea but usually has regular bowel movements.  The patient denies having any vomiting, fever, chills, hematochezia, melena, hematemesis, abdominal distention, abdominal pain,  jaundice, pruritus. States she has gained some weight recently.  Last LKG:4010 - dr Karilyn Cota  - Normal proximal esophagus and mid esophagus. - Benign- appearing distal esophageal stenosis. Dilated with a balloon up to 18 mm. - LA Grade A reflux esophagitis. - 2 cm hiatal hernia. - Non- bleeding gastric ulcer with no stigmata of bleeding. - Portal hypertensive gastropathy. - Normal duodenal bulb. 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 - Normal second portion of the duodenum. - No specimens  collected.  She underwent an ERCP on 09/11/2022 that showed: - Normal proximal esophagus and mid esophagus. - Benign- appearing distal esophageal stenosis. Dilated. - LA Grade A reflux esophagitis. - 2 cm hiatal hernia. - Non- bleeding gastric ulcer with no stigmata of bleeding. - Portal hypertensive gastropathy. - Normal duodenal bulb. 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 - Normal second portion of the duodenum. - No specimens collected.  Notably, she had a cholecystostomy tube placed by interventional radiology.  Last time seen by their office was in April 2024.  She was seen by Dr. Lovell Sheehan on 02/14/2023.  At that time she reported her cholecystostomy tube was dislodged.  She was considered to be a high risk patient for surgical intervention.  Last Colonoscopy:possibly >10 years ago, no report available  FHx: neg for any gastrointestinal/liver disease, father lung/prostate cancer, brother brain/prosate cancer,  Social: quit smoking 2 years ago, neg alcohol or illicit drug use Surgical: no abdominal surgeries  Past Medical History: Past Medical History:  Diagnosis Date   Anemia    Anxiety    Aortic atherosclerosis (HCC) 04/21/2017   Arthritis    Bipolar 1 disorder (HCC)    Chronic back pain    COPD (chronic obstructive pulmonary disease) (HCC)    Diastolic dysfunction with heart failure (HCC) 04/20/2017   Duodenal stricture    Early onset Alzheimer's dementia (HCC)    GERD (gastroesophageal reflux disease)    Heart murmur    High cholesterol    Hypertension    Shortness of breath dyspnea     Past Surgical History: Past Surgical History:  Procedure Laterality Date   BIOPSY  01/06/2017   Procedure: BIOPSY;  Surgeon: Malissa Hippo,  MD;  [facility];  Service: Endoscopy;;  esophageal biopsy   BIOPSY  09/11/2022   Procedure: BIOPSY;  Surgeon: Meridee Score Netty Starring., MD;  Location: [facility] ENDOSCOPY;  Service: Gastroenterology;;   CESAREAN SECTION     COLONOSCOPY      ERCP N/A 09/11/2022   Procedure: ENDOSCOPIC RETROGRADE CHOLANGIOPANCREATOGRAPHY (ERCP);  Surgeon: Lemar Lofty., MD;  Location: [facility] ENDOSCOPY;  Service: Gastroenterology;  Laterality: N/A;   ESOPHAGEAL DILATION N/A 10/29/2015   Procedure: ESOPHAGEAL DILATION;  Surgeon: Malissa Hippo, MD;  [facility];  Service: Endoscopy;  Laterality: N/A;   ESOPHAGEAL DILATION N/A 01/29/2016   Procedure: ESOPHAGEAL DILATION;  Surgeon: Malissa Hippo, MD;  [facility];  Service: Endoscopy;  Laterality: N/A;   ESOPHAGEAL DILATION N/A 01/06/2017   Procedure: ESOPHAGEAL DILATION;  Surgeon: Malissa Hippo, MD;  [facility];  Service: Endoscopy;  Laterality: N/A;   ESOPHAGOGASTRODUODENOSCOPY N/A 10/29/2015   Procedure: ESOPHAGOGASTRODUODENOSCOPY (EGD);  Surgeon: Malissa Hippo, MD;  [facility];  Service: Endoscopy;  Laterality: N/A;  1200   ESOPHAGOGASTRODUODENOSCOPY (EGD) WITH PROPOFOL N/A 01/29/2016   Procedure: ESOPHAGOGASTRODUODENOSCOPY (EGD) WITH PROPOFOL;  Surgeon: Malissa Hippo, MD;  [facility];  Service: Endoscopy;  Laterality: N/A;  7:30 - moved to 4/21 @11 : 25 - Ann notified pt to arrive at 10:00   ESOPHAGOGASTRODUODENOSCOPY (EGD) WITH PROPOFOL N/A 01/06/2017   Procedure: ESOPHAGOGASTRODUODENOSCOPY (EGD) WITH PROPOFOL;  Surgeon: Malissa Hippo, MD;  [facility];  Service: Endoscopy;  Laterality: N/A;  11:20   ESOPHAGOGASTRODUODENOSCOPY (EGD) WITH PROPOFOL N/A 02/17/2017   Procedure: ESOPHAGOGASTRODUODENOSCOPY (EGD) WITH PROPOFOL;  Surgeon: Malissa Hippo, MD;  [facility];  Service: Endoscopy;  Laterality: N/A;  10:30   FOOT SURGERY Right    bunionectomy   HEMORRHOID SURGERY     IR EXCHANGE BILIARY DRAIN  10/19/2022   IR EXCHANGE BILIARY DRAIN  12/14/2022   IR EXCHANGE BILIARY DRAIN  02/08/2023   IR PERC CHOLECYSTOSTOMY  08/27/2022   REMOVAL OF STONES  09/11/2022   Procedure: REMOVAL OF STONES;  Surgeon:  Lemar Lofty., MD;  Location: [facility] ENDOSCOPY;  Service: Gastroenterology;;   Dennison Mascot  09/11/2022   Procedure: Dennison Mascot;  Surgeon: Lemar Lofty., MD;  Location: [facility] ENDOSCOPY;  Service: Gastroenterology;;    Family History:History reviewed. No pertinent family history.  Social History: Social History   Tobacco Use  Smoking Status Former   Current packs/day: 0.00   Average packs/day: 2.0 packs/day for 35.0 years (70.0 ttl pk-yrs)   Types: Cigarettes   Start date: 07/24/1980   Quit date: 07/25/2015   Years since quitting: 7.8  Smokeless Tobacco Never  Tobacco Comments   quit 2 weeks ago (November 2016)   Social History   Substance and Sexual Activity  Alcohol Use No   Alcohol/week: 0.0 standard drinks of alcohol   Social History   Substance and Sexual Activity  Drug Use No    Allergies: No Active Allergies  Medications: Current Outpatient Medications  Medication Sig Dispense Refill   ACETAMINOPHEN ER PO Take 650 mg by mouth every 8 (eight) hours as needed for pain.     albuterol (PROVENTIL HFA;VENTOLIN HFA) 108 (90 BASE) MCG/ACT inhaler Inhale 2 puffs into the lungs 4 (four) times daily. (0800, 1200, 1600, & 2000)     apixaban (ELIQUIS) 5 MG TABS tablet Take 1 tablet (5 mg total) by mouth 2 (two) times daily. 60 tablet 1   atorvastatin (  LIPITOR) 10 MG tablet Take 1 tablet (10 mg total) by mouth daily at 8 pm. Resume on 04/22/20 if LFTs are back to normal (Patient taking differently: Take 40 mg by mouth daily at 8 pm.) 30 tablet 0   benztropine (COGENTIN) 0.5 MG tablet Take 0.5 mg by mouth 2 (two) times daily.     diphenhydrAMINE (BENADRYL) 25 mg capsule Take 25 mg by mouth every 4 (four) hours as needed.     donepezil (ARICEPT) 10 MG tablet Take 10 mg by mouth daily at 8 pm.     doxycycline (ADOXA) 100 MG tablet Take 100 mg by mouth daily.     fluticasone (FLONASE) 50 MCG/ACT nasal spray Place 1 spray into both nostrils daily. (0800)      Fluticasone Furoate-Vilanterol (BREO ELLIPTA) 200-25 MCG/INH AEPB Inhale 1 puff into the lungs daily. (0800)     furosemide (LASIX) 40 MG tablet Take 40 mg by mouth daily. (0800)     gabapentin (NEURONTIN) 300 MG capsule Take 300 mg by mouth 3 (three) times daily.     guaifenesin (ROBITUSSIN) 100 MG/5ML syrup Take 10 mLs by mouth every 6 (six) hours.     isosorbide mononitrate (IMDUR) 30 MG 24 hr tablet Take 0.5 tablets (15 mg total) by mouth daily.     LATUDA 80 MG TABS tablet Take 40 mg by mouth at bedtime.     loperamide (IMODIUM) 2 MG capsule Take 1 capsule (2 mg total) by mouth 2 (two) times daily as needed for diarrhea or loose stools. 30 capsule 0   memantine (NAMENDA) 10 MG tablet Take 10 mg by mouth 2 (two) times daily.     metoprolol succinate (TOPROL-XL) 25 MG 24 hr tablet Take 1 tablet (25 mg total) by mouth daily.     Olopatadine HCl (PATADAY) 0.2 % SOLN Place 1 drop into both eyes daily. (0800)     Potassium Chloride ER 20 MEQ TBCR Take 1 tablet by mouth 2 (two) times daily.     potassium chloride SA (KLOR-CON M) 20 MEQ tablet Take 1 tablet (20 mEq total) by mouth 2 (two) times daily. (Patient taking differently: Take 20 mEq by mouth 2 (two) times daily. Along with a 10 in the am) 4 tablet 0   tiZANidine (ZANAFLEX) 2 MG tablet Take 1 tablet (2 mg total) by mouth 2 (two) times daily. (0800 & 2000) 10 tablet 0   traZODone (DESYREL) 50 MG tablet Take 50 mg by mouth at bedtime.     venlafaxine XR (EFFEXOR-XR) 150 MG 24 hr capsule Take 300 mg by mouth daily.     No current facility-administered medications for this visit.    Review of Systems: GENERAL: negative for malaise, night sweats HEENT: No changes in hearing or vision, no nose bleeds or other nasal problems. NECK: Negative for lumps, goiter, pain and significant neck swelling RESPIRATORY: Negative for cough, wheezing CARDIOVASCULAR: Negative for chest pain, leg swelling, palpitations, orthopnea GI: SEE HPI MUSCULOSKELETAL:  Negative for joint pain or swelling, back pain, and muscle pain. SKIN: Negative for lesions, rash PSYCH: Negative for sleep disturbance, mood disorder and recent psychosocial stressors. HEMATOLOGY Negative for prolonged bleeding, bruising easily, and swollen nodes. ENDOCRINE: Negative for cold or heat intolerance, polyuria, polydipsia and goiter. NEURO: negative for tremor, gait imbalance, syncope and seizures. The remainder of the review of systems is noncontributory.   Physical Exam: BP 134/82 (BP Location: Left Arm, Patient Position: Sitting, Cuff Size: Large)   Pulse 86   Temp Marland Kitchen)  97.1 F (36.2 C) (Temporal)   Ht 5\' 5"  (1.651 m)   Wt 186 lb 3.2 oz (84.5 kg)   BMI 30.99 kg/m  GENERAL: The patient is AO x3, in no acute distress. HEENT: Head is normocephalic and atraumatic. EOMI are intact. Mouth is well hydrated and without lesions. NECK: Supple. No masses LUNGS: Clear to auscultation. No presence of rhonchi/wheezing/rales. Adequate chest expansion HEART: RRR, normal s1 and s2. ABDOMEN: Soft, nontender, no guarding, no peritoneal signs, and nondistended. BS +. No masses. EXTREMITIES: Without any cyanosis, clubbing, rash, lesions or edema. NEUROLOGIC: AOx3, no focal motor deficit. SKIN: no jaundice, no rashes   Imaging/Labs: as above  I personally reviewed and interpreted the available labs, imaging and endoscopic files.  Impression and Plan: Kathleen Valenzuela is a 72 y.o. female with PMH bipolar disorder, COPD, afib,  diastolic heart failure, duodenal stricture, Alzheimer's dementia, atherosclerosis, anxiety, hypertension, hyperlipidemia, GERD complicated by esophagitis and esophageal stricture, who presents for evaluation of dysphagia.  The patient has presented recurrent episodes of dysphagia, mostly to solid foods.  She has a history of esophageal strictures due to GERD that required dilation in the past.  I explained to the patient that this will need to have further evaluation  with an EGD which she is agreeable to proceed with this.  Given her history of Alzheimer's dementia, I spoke with son Kathleen Valenzuela) who is listed as her emergency contact.  He is also in agreement to proceed with this.  I explained to the patient that since she has had a history of GERD with stricturing complications, she should be taking a PPI on a regular basis.  I will start her on omeprazole 40 mg every day.  The patient is also due for colorectal cancer screening, colonoscopy will be performed on the same day of the esophagogastroduodenospy.  Finally, the patient had a history of choledocholithiasis that required an ERCP in the past.  Given her multiple comorbidities, she was not considered to be an adequate candidate for surgical management with cholecystectomy.  She underwent a cholecystostomy tube in the past but this got dislodged and currently does not have any drainage.  To potentially prevent recurrent episodes of choledocholithiasis or cholecystitis, I will start her on ursodiol 500 mg twice daily.  -Schedule EGD and colonoscopy -will need to obtain clearance from cardiologist to stop Eliquis 2 days before the procedure -Start omeprazole 40 mg qday -Start ursodiol 500 mg twice a day  All questions were answered.      Kathleen Blazing, MD Gastroenterology and Hepatology [facility] Gastroenterology

## 2023-06-15 NOTE — Patient Instructions (Addendum)
Schedule EGD and colonoscopy Start omeprazole 40 mg qday Start ursodiol 500 mg twice a day

## 2023-06-21 ENCOUNTER — Telehealth (INDEPENDENT_AMBULATORY_CARE_PROVIDER_SITE_OTHER): Payer: Self-pay | Admitting: Gastroenterology

## 2023-06-21 NOTE — Telephone Encounter (Signed)
Patient doesn't follow with cardiology. Clearance should come from managing provider.

## 2023-06-21 NOTE — Telephone Encounter (Signed)
   Patient Name: Kathleen Valenzuela  DOB: [birth year] MRN: [id number]  Primary Cardiologist: Dietrich Pates, MD  Chart reviewed as part of pre-operative protocol coverage.   -It looks like the patient's Eliquis is not prescribed by cardiology.  Clearance should come from prescribing provider.  She saw a few providers back in 2021 but has not followed with heart care since then.  Will route this bundled recommendation to requesting provider via Epic fax function and remove from pre-op pool. Please call with questions.  Sharlene Dory, PA-C 06/21/2023, 10:38 AM

## 2023-06-21 NOTE — Telephone Encounter (Signed)
    06/21/23  Nyoka Cowden Hemphill [birth year]  What type of surgery is being performed? EGD/Colonoscopy  When is surgery scheduled? TBD  Clearance to hold Eliquis 2 days prior  Name of physician performing surgery?  Dr. Katrinka Blazing [facility] Gastroenterology at [facility] Phone: [phone] Fax: [phone]  Anethesia type (none, local, MAC, general)? MAC

## 2023-07-10 DIAGNOSIS — K8050 Calculus of bile duct without cholangitis or cholecystitis without obstruction: Secondary | ICD-10-CM | POA: Diagnosis not present

## 2023-07-10 DIAGNOSIS — I4891 Unspecified atrial fibrillation: Secondary | ICD-10-CM | POA: Diagnosis not present

## 2023-07-25 DIAGNOSIS — E790 Hyperuricemia without signs of inflammatory arthritis and tophaceous disease: Secondary | ICD-10-CM | POA: Diagnosis not present

## 2023-07-26 DIAGNOSIS — M109 Gout, unspecified: Secondary | ICD-10-CM | POA: Diagnosis not present

## 2023-07-26 DIAGNOSIS — K1379 Other lesions of oral mucosa: Secondary | ICD-10-CM | POA: Diagnosis not present

## 2023-07-27 DIAGNOSIS — R1314 Dysphagia, pharyngoesophageal phase: Secondary | ICD-10-CM | POA: Diagnosis not present

## 2023-07-27 DIAGNOSIS — R278 Other lack of coordination: Secondary | ICD-10-CM | POA: Diagnosis not present

## 2023-07-27 DIAGNOSIS — M6289 Other specified disorders of muscle: Secondary | ICD-10-CM | POA: Diagnosis not present

## 2023-07-28 DIAGNOSIS — R278 Other lack of coordination: Secondary | ICD-10-CM | POA: Diagnosis not present

## 2023-07-28 DIAGNOSIS — R1314 Dysphagia, pharyngoesophageal phase: Secondary | ICD-10-CM | POA: Diagnosis not present

## 2023-07-28 DIAGNOSIS — M6289 Other specified disorders of muscle: Secondary | ICD-10-CM | POA: Diagnosis not present

## 2023-07-31 DIAGNOSIS — R278 Other lack of coordination: Secondary | ICD-10-CM | POA: Diagnosis not present

## 2023-07-31 DIAGNOSIS — R1314 Dysphagia, pharyngoesophageal phase: Secondary | ICD-10-CM | POA: Diagnosis not present

## 2023-07-31 DIAGNOSIS — M6289 Other specified disorders of muscle: Secondary | ICD-10-CM | POA: Diagnosis not present

## 2023-08-01 DIAGNOSIS — R1314 Dysphagia, pharyngoesophageal phase: Secondary | ICD-10-CM | POA: Diagnosis not present

## 2023-08-01 DIAGNOSIS — M6289 Other specified disorders of muscle: Secondary | ICD-10-CM | POA: Diagnosis not present

## 2023-08-01 DIAGNOSIS — R278 Other lack of coordination: Secondary | ICD-10-CM | POA: Diagnosis not present

## 2023-08-01 NOTE — Telephone Encounter (Signed)
Resent clearance to PCP

## 2023-08-02 DIAGNOSIS — R1314 Dysphagia, pharyngoesophageal phase: Secondary | ICD-10-CM | POA: Diagnosis not present

## 2023-08-02 DIAGNOSIS — R278 Other lack of coordination: Secondary | ICD-10-CM | POA: Diagnosis not present

## 2023-08-02 DIAGNOSIS — M6289 Other specified disorders of muscle: Secondary | ICD-10-CM | POA: Diagnosis not present

## 2023-08-03 DIAGNOSIS — R1314 Dysphagia, pharyngoesophageal phase: Secondary | ICD-10-CM | POA: Diagnosis not present

## 2023-08-03 DIAGNOSIS — M6289 Other specified disorders of muscle: Secondary | ICD-10-CM | POA: Diagnosis not present

## 2023-08-03 DIAGNOSIS — R278 Other lack of coordination: Secondary | ICD-10-CM | POA: Diagnosis not present

## 2023-08-04 DIAGNOSIS — R278 Other lack of coordination: Secondary | ICD-10-CM | POA: Diagnosis not present

## 2023-08-04 DIAGNOSIS — K219 Gastro-esophageal reflux disease without esophagitis: Secondary | ICD-10-CM | POA: Diagnosis not present

## 2023-08-04 DIAGNOSIS — M6289 Other specified disorders of muscle: Secondary | ICD-10-CM | POA: Diagnosis not present

## 2023-08-04 DIAGNOSIS — R1310 Dysphagia, unspecified: Secondary | ICD-10-CM | POA: Diagnosis not present

## 2023-08-04 DIAGNOSIS — R1314 Dysphagia, pharyngoesophageal phase: Secondary | ICD-10-CM | POA: Diagnosis not present

## 2023-08-05 DIAGNOSIS — R1314 Dysphagia, pharyngoesophageal phase: Secondary | ICD-10-CM | POA: Diagnosis not present

## 2023-08-05 DIAGNOSIS — R278 Other lack of coordination: Secondary | ICD-10-CM | POA: Diagnosis not present

## 2023-08-05 DIAGNOSIS — M6289 Other specified disorders of muscle: Secondary | ICD-10-CM | POA: Diagnosis not present

## 2023-08-06 DIAGNOSIS — R1314 Dysphagia, pharyngoesophageal phase: Secondary | ICD-10-CM | POA: Diagnosis not present

## 2023-08-06 DIAGNOSIS — M6289 Other specified disorders of muscle: Secondary | ICD-10-CM | POA: Diagnosis not present

## 2023-08-06 DIAGNOSIS — R278 Other lack of coordination: Secondary | ICD-10-CM | POA: Diagnosis not present

## 2023-08-07 DIAGNOSIS — R278 Other lack of coordination: Secondary | ICD-10-CM | POA: Diagnosis not present

## 2023-08-07 DIAGNOSIS — R1314 Dysphagia, pharyngoesophageal phase: Secondary | ICD-10-CM | POA: Diagnosis not present

## 2023-08-07 DIAGNOSIS — M6289 Other specified disorders of muscle: Secondary | ICD-10-CM | POA: Diagnosis not present

## 2023-08-07 NOTE — Telephone Encounter (Signed)
Fax from provider stating "yes" to hold eliquis 2 days prior

## 2023-08-08 DIAGNOSIS — M6289 Other specified disorders of muscle: Secondary | ICD-10-CM | POA: Diagnosis not present

## 2023-08-08 DIAGNOSIS — R278 Other lack of coordination: Secondary | ICD-10-CM | POA: Diagnosis not present

## 2023-08-08 DIAGNOSIS — R1314 Dysphagia, pharyngoesophageal phase: Secondary | ICD-10-CM | POA: Diagnosis not present

## 2023-08-09 DIAGNOSIS — M6289 Other specified disorders of muscle: Secondary | ICD-10-CM | POA: Diagnosis not present

## 2023-08-09 DIAGNOSIS — R278 Other lack of coordination: Secondary | ICD-10-CM | POA: Diagnosis not present

## 2023-08-09 DIAGNOSIS — R1314 Dysphagia, pharyngoesophageal phase: Secondary | ICD-10-CM | POA: Diagnosis not present

## 2023-08-10 DIAGNOSIS — R278 Other lack of coordination: Secondary | ICD-10-CM | POA: Diagnosis not present

## 2023-08-10 DIAGNOSIS — M6289 Other specified disorders of muscle: Secondary | ICD-10-CM | POA: Diagnosis not present

## 2023-08-10 DIAGNOSIS — R1314 Dysphagia, pharyngoesophageal phase: Secondary | ICD-10-CM | POA: Diagnosis not present

## 2023-08-11 DIAGNOSIS — R1314 Dysphagia, pharyngoesophageal phase: Secondary | ICD-10-CM | POA: Diagnosis not present

## 2023-08-11 DIAGNOSIS — M6289 Other specified disorders of muscle: Secondary | ICD-10-CM | POA: Diagnosis not present

## 2023-08-11 DIAGNOSIS — R278 Other lack of coordination: Secondary | ICD-10-CM | POA: Diagnosis not present

## 2023-08-14 DIAGNOSIS — R1314 Dysphagia, pharyngoesophageal phase: Secondary | ICD-10-CM | POA: Diagnosis not present

## 2023-08-14 DIAGNOSIS — R278 Other lack of coordination: Secondary | ICD-10-CM | POA: Diagnosis not present

## 2023-08-14 DIAGNOSIS — M6289 Other specified disorders of muscle: Secondary | ICD-10-CM | POA: Diagnosis not present

## 2023-08-15 DIAGNOSIS — R278 Other lack of coordination: Secondary | ICD-10-CM | POA: Diagnosis not present

## 2023-08-15 DIAGNOSIS — M6289 Other specified disorders of muscle: Secondary | ICD-10-CM | POA: Diagnosis not present

## 2023-08-15 DIAGNOSIS — R1314 Dysphagia, pharyngoesophageal phase: Secondary | ICD-10-CM | POA: Diagnosis not present

## 2023-08-16 ENCOUNTER — Telehealth (INDEPENDENT_AMBULATORY_CARE_PROVIDER_SITE_OTHER): Payer: Self-pay | Admitting: Gastroenterology

## 2023-08-16 DIAGNOSIS — M6289 Other specified disorders of muscle: Secondary | ICD-10-CM | POA: Diagnosis not present

## 2023-08-16 DIAGNOSIS — R1314 Dysphagia, pharyngoesophageal phase: Secondary | ICD-10-CM | POA: Diagnosis not present

## 2023-08-16 DIAGNOSIS — R278 Other lack of coordination: Secondary | ICD-10-CM | POA: Diagnosis not present

## 2023-08-16 NOTE — Telephone Encounter (Signed)
Contacted son to schedule TCS/EGD as per encounter form. Son asked that I call [facility] and schedule with them. Contacted [employer] and was transferred to [employer]. Left message to return call

## 2023-08-17 DIAGNOSIS — R1314 Dysphagia, pharyngoesophageal phase: Secondary | ICD-10-CM | POA: Diagnosis not present

## 2023-08-17 DIAGNOSIS — M6289 Other specified disorders of muscle: Secondary | ICD-10-CM | POA: Diagnosis not present

## 2023-08-17 DIAGNOSIS — R278 Other lack of coordination: Secondary | ICD-10-CM | POA: Diagnosis not present

## 2023-08-17 MED ORDER — PEG 3350-KCL-NA BICARB-NACL 420 G PO SOLR: 4000.0000 mL | Freq: Once | ORAL | 0 refills | Status: AC

## 2023-08-17 NOTE — Telephone Encounter (Signed)
Amy returned call and scheduled pt for 09/19/23 at 11:15am. Will need to fax instructions and prep script once signed. Will need to complete PA after first of year.

## 2023-08-17 NOTE — Addendum Note (Signed)
Addended by: Marlowe Shores on: 08/17/2023 08:37 AM   Modules accepted: Orders

## 2023-08-18 DIAGNOSIS — R1314 Dysphagia, pharyngoesophageal phase: Secondary | ICD-10-CM | POA: Diagnosis not present

## 2023-08-18 DIAGNOSIS — M6289 Other specified disorders of muscle: Secondary | ICD-10-CM | POA: Diagnosis not present

## 2023-08-18 DIAGNOSIS — R278 Other lack of coordination: Secondary | ICD-10-CM | POA: Diagnosis not present

## 2023-08-21 DIAGNOSIS — M6289 Other specified disorders of muscle: Secondary | ICD-10-CM | POA: Diagnosis not present

## 2023-08-21 DIAGNOSIS — R1314 Dysphagia, pharyngoesophageal phase: Secondary | ICD-10-CM | POA: Diagnosis not present

## 2023-08-21 DIAGNOSIS — R278 Other lack of coordination: Secondary | ICD-10-CM | POA: Diagnosis not present

## 2023-08-22 DIAGNOSIS — R1314 Dysphagia, pharyngoesophageal phase: Secondary | ICD-10-CM | POA: Diagnosis not present

## 2023-08-22 DIAGNOSIS — M6289 Other specified disorders of muscle: Secondary | ICD-10-CM | POA: Diagnosis not present

## 2023-08-22 DIAGNOSIS — R278 Other lack of coordination: Secondary | ICD-10-CM | POA: Diagnosis not present

## 2023-08-23 DIAGNOSIS — M6289 Other specified disorders of muscle: Secondary | ICD-10-CM | POA: Diagnosis not present

## 2023-08-23 DIAGNOSIS — R1314 Dysphagia, pharyngoesophageal phase: Secondary | ICD-10-CM | POA: Diagnosis not present

## 2023-08-23 DIAGNOSIS — R278 Other lack of coordination: Secondary | ICD-10-CM | POA: Diagnosis not present

## 2023-08-24 DIAGNOSIS — R278 Other lack of coordination: Secondary | ICD-10-CM | POA: Diagnosis not present

## 2023-08-24 DIAGNOSIS — R1314 Dysphagia, pharyngoesophageal phase: Secondary | ICD-10-CM | POA: Diagnosis not present

## 2023-08-24 DIAGNOSIS — M6289 Other specified disorders of muscle: Secondary | ICD-10-CM | POA: Diagnosis not present

## 2023-08-25 DIAGNOSIS — M6289 Other specified disorders of muscle: Secondary | ICD-10-CM | POA: Diagnosis not present

## 2023-08-25 DIAGNOSIS — R278 Other lack of coordination: Secondary | ICD-10-CM | POA: Diagnosis not present

## 2023-08-25 DIAGNOSIS — R1314 Dysphagia, pharyngoesophageal phase: Secondary | ICD-10-CM | POA: Diagnosis not present

## 2023-08-27 DIAGNOSIS — M6289 Other specified disorders of muscle: Secondary | ICD-10-CM | POA: Diagnosis not present

## 2023-08-27 DIAGNOSIS — R1314 Dysphagia, pharyngoesophageal phase: Secondary | ICD-10-CM | POA: Diagnosis not present

## 2023-08-27 DIAGNOSIS — R278 Other lack of coordination: Secondary | ICD-10-CM | POA: Diagnosis not present

## 2023-08-28 DIAGNOSIS — M6289 Other specified disorders of muscle: Secondary | ICD-10-CM | POA: Diagnosis not present

## 2023-08-28 DIAGNOSIS — R1314 Dysphagia, pharyngoesophageal phase: Secondary | ICD-10-CM | POA: Diagnosis not present

## 2023-08-28 DIAGNOSIS — R278 Other lack of coordination: Secondary | ICD-10-CM | POA: Diagnosis not present

## 2023-09-07 NOTE — Telephone Encounter (Signed)
 No PA needed via [facility]

## 2023-09-11 NOTE — Patient Instructions (Signed)
 Kathleen Valenzuela  09/11/2023     @PREFPERIOPPHARMACY @   Your procedure is scheduled on  09/19/2023.   Report to [facility] at  0930 A.M.   Call this number if you have problems the morning of surgery:  [phone]  If you experience any cold or flu symptoms such as cough, fever, chills, shortness of breath, etc. between now and your scheduled surgery, please notify us  at the above number.   Remember:  Follow the diet and prep instructions given to you by the office.        Your last dose of eliquis  should be 09/15/2022.        Use your inhaler before you come and bring your rescue inhaler with you.   You may drink clear liquids until 0730 am on 09/19/2023.   Clear liquids allowed are:                    Water , Juice (No red color; non-citric and without pulp; diabetics please choose diet or no sugar options), Carbonated beverages (diabetics please choose diet or no sugar options), Clear Tea (No creamer, milk, or cream, including half & half and powdered creamer), Black Coffee Only (No creamer, milk or cream, including half & half and powdered creamer), and Clear Sports drink (No red color; diabetics please choose diet or no sugar options)    Take these medicines the morning of surgery with A SIP OF WATER                 benztropine , donepezil , gabapentin , isosorbide , memantine , metoprolol , omeprazole , tizanidine , urosodiol, venlafaxine .    Do not wear jewelry, make-up or nail polish, including gel polish,  artificial nails, or any other type of covering on natural nails (fingers and  toes).  Do not wear lotions, powders, or perfumes, or deodorant.  Do not shave 48 hours prior to surgery.  Men may shave face and neck.  Do not bring valuables to the hospital.  [facility] is not responsible for any belongings or valuables.  Contacts, dentures or bridgework may not be worn into surgery.  Leave your suitcase in the car.  After surgery it may be brought to your room.  For  patients admitted to the hospital, discharge time will be determined by your treatment team.  Patients discharged the day of surgery will not be allowed to drive home.    Special instructions:   DO NOT smoke tobacco or vape for 24 hours before your procedure.  Please read over the following fact sheets that you were given. Anesthesia Post-op Instructions and Care and Recovery After Surgery      Upper Endoscopy, Adult, Care After After the procedure, it is common to have a sore throat. It is also common to have: Mild stomach pain or discomfort. Bloating. Nausea. Follow these instructions at home: The instructions below may help you care for yourself at home. Your health care provider may give you more instructions. If you have questions, ask your health care provider. If you were given a sedative during the procedure, it can affect you for several hours. Do not drive or operate machinery until your health care provider says that it is safe. If you will be going home right after the procedure, plan to have a responsible adult: Take you home from the hospital or clinic. You will not be allowed to drive. Care for you for the time you are told. Follow instructions from your  health care provider about what you may eat and drink. Return to your normal activities as told by your health care provider. Ask your health care provider what activities are safe for you. Take over-the-counter and prescription medicines only as told by your health care provider. Contact a health care provider if you: Have a sore throat that lasts longer than one day. Have trouble swallowing. Have a fever. Get help right away if you: Vomit blood or your vomit looks like coffee grounds. Have bloody, black, or tarry stools. Have a very bad sore throat or you cannot swallow. Have difficulty breathing or very bad pain in your chest or abdomen. These symptoms may be an emergency. Get help right away. Call 911. Do not  wait to see if the symptoms will go away. Do not drive yourself to the hospital. Summary After the procedure, it is common to have a sore throat, mild stomach discomfort, bloating, and nausea. If you were given a sedative during the procedure, it can affect you for several hours. Do not drive until your health care provider says that it is safe. Follow instructions from your health care provider about what you may eat and drink. Return to your normal activities as told by your health care provider. This information is not intended to replace advice given to you by your health care provider. Make sure you discuss any questions you have with your health care provider. Document Revised: 12/01/2021 Document Reviewed: 12/01/2021 Elsevier Patient Education  2024 [employer] Colonoscopy, Adult, Care After The following information offers guidance on how to care for yourself after your procedure. Your health care provider may also give you more specific instructions. If you have problems or questions, contact your health care provider. What can I expect after the procedure? After the procedure, it is common to have: A small amount of blood in your stool for 24 hours after the procedure. Some gas. Mild cramping or bloating of your abdomen. Follow these instructions at home: Eating and drinking  Drink enough fluid to keep your urine pale yellow. Follow instructions from your health care provider about eating or drinking restrictions. Resume your normal diet as told by your health care provider. Avoid heavy or fried foods that are hard to digest. Activity Rest as told by your health care provider. Avoid sitting for a long time without moving. Get up to take short walks every 1-2 hours. This is important to improve blood flow and breathing. Ask for help if you feel weak or unsteady. Return to your normal activities as told by your health care provider. Ask your health care provider what activities  are safe for you. Managing cramping and bloating  Try walking around when you have cramps or feel bloated. If directed, apply heat to your abdomen as told by your health care provider. Use the heat source that your health care provider recommends, such as a moist heat pack or a heating pad. Place a towel between your skin and the heat source. Leave the heat on for 20-30 minutes. Remove the heat if your skin turns bright red. This is especially important if you are unable to feel pain, heat, or cold. You have a greater risk of getting burned. General instructions If you were given a sedative during the procedure, it can affect you for several hours. Do not drive or operate machinery until your health care provider says that it is safe. For the first 24 hours after the procedure: Do not sign important documents. Do  not drink alcohol. Do your regular daily activities at a slower pace than normal. Eat soft foods that are easy to digest. Take over-the-counter and prescription medicines only as told by your health care provider. Keep all follow-up visits. This is important. Contact a health care provider if: You have blood in your stool 2-3 days after the procedure. Get help right away if: You have more than a small spotting of blood in your stool. You have large blood clots in your stool. You have swelling of your abdomen. You have nausea or vomiting. You have a fever. You have increasing pain in your abdomen that is not relieved with medicine. These symptoms may be an emergency. Get help right away. Call 911. Do not wait to see if the symptoms will go away. Do not drive yourself to the hospital. Summary After the procedure, it is common to have a small amount of blood in your stool. You may also have mild cramping and bloating of your abdomen. If you were given a sedative during the procedure, it can affect you for several hours. Do not drive or operate machinery until your health care  provider says that it is safe. Get help right away if you have a lot of blood in your stool, nausea or vomiting, a fever, or increased pain in your abdomen. This information is not intended to replace advice given to you by your health care provider. Make sure you discuss any questions you have with your health care provider. Document Revised: 10/04/2022 Document Reviewed: 04/14/2021 Elsevier Patient Education  2024 Else[employer] Inc. Monitored Anesthesia Care, Care After The following information offers guidance on how to care for yourself after your procedure. Your health care provider may also give you more specific instructions. If you have problems or questions, contact your health care provider. What can I expect after the procedure? After the procedure, it is common to have: Tiredness. Little or no memory about what happened during or after the procedure. Impaired judgment when it comes to making decisions. Nausea or vomiting. Some trouble with balance. Follow these instructions at home: For the time period you were told by your health care provider:  Rest. Do not participate in activities where you could fall or become injured. Do not drive or use machinery. Do not drink alcohol. Do not take sleeping pills or medicines that cause drowsiness. Do not make important decisions or sign legal documents. Do not take care of children on your own. Medicines Take over-the-counter and prescription medicines only as told by your health care provider. If you were prescribed antibiotics, take them as told by your health care provider. Do not stop using the antibiotic even if you start to feel better. Eating and drinking Follow instructions from your health care provider about what you may eat and drink. Drink enough fluid to keep your urine pale yellow. If you vomit: Drink clear fluids slowly and in small amounts as you are able. Clear fluids include water , ice chips, low-calorie sports drinks,  and fruit juice that has water  added to it (diluted fruit juice). Eat light and bland foods in small amounts as you are able. These foods include bananas, applesauce, rice, lean meats, toast, and crackers. General instructions  Have a responsible adult stay with you for the time you are told. It is important to have someone help care for you until you are awake and alert. If you have sleep apnea, surgery and some medicines can increase your risk for breathing problems. Follow instructions  from your health care provider about wearing your sleep device: When you are sleeping. This includes during daytime naps. While taking prescription pain medicines, sleeping medicines, or medicines that make you drowsy. Do not use any products that contain nicotine or tobacco. These products include cigarettes, chewing tobacco, and vaping devices, such as e-cigarettes. If you need help quitting, ask your health care provider. Contact a health care provider if: You feel nauseous or vomit every time you eat or drink. You feel light-headed. You are still sleepy or having trouble with balance after 24 hours. You get a rash. You have a fever. You have redness or swelling around the IV site. Get help right away if: You have trouble breathing. You have new confusion after you get home. These symptoms may be an emergency. Get help right away. Call 911. Do not wait to see if the symptoms will go away. Do not drive yourself to the hospital. This information is not intended to replace advice given to you by your health care provider. Make sure you discuss any questions you have with your health care provider. Document Revised: 01/17/2022 Document Reviewed: 01/17/2022 Elsevier Patient Education  2024 [employer].

## 2023-09-12 ENCOUNTER — Encounter (HOSPITAL_COMMUNITY)
Admission: RE | Admit: 2023-09-12 | Discharge: 2023-09-12 | Disposition: A | Discharge status: Home / Self Care | Payer: 59 | Source: Ambulatory Visit | Attending: Gastroenterology | Admitting: Gastroenterology

## 2023-09-12 ENCOUNTER — Encounter (HOSPITAL_COMMUNITY): Payer: Self-pay

## 2023-09-12 VITALS — Ht 63.0 in | Wt 195.0 lb

## 2023-09-12 DIAGNOSIS — R5381 Other malaise: Secondary | ICD-10-CM | POA: Diagnosis not present

## 2023-09-12 DIAGNOSIS — K219 Gastro-esophageal reflux disease without esophagitis: Secondary | ICD-10-CM | POA: Diagnosis not present

## 2023-09-12 DIAGNOSIS — R112 Nausea with vomiting, unspecified: Secondary | ICD-10-CM | POA: Diagnosis not present

## 2023-09-12 DIAGNOSIS — D649 Anemia, unspecified: Secondary | ICD-10-CM

## 2023-09-12 DIAGNOSIS — Z79899 Other long term (current) drug therapy: Secondary | ICD-10-CM

## 2023-09-13 DIAGNOSIS — I10 Essential (primary) hypertension: Secondary | ICD-10-CM | POA: Diagnosis not present

## 2023-09-15 DIAGNOSIS — R5381 Other malaise: Secondary | ICD-10-CM | POA: Diagnosis not present

## 2023-09-15 DIAGNOSIS — R112 Nausea with vomiting, unspecified: Secondary | ICD-10-CM | POA: Diagnosis not present

## 2023-09-18 DIAGNOSIS — I10 Essential (primary) hypertension: Secondary | ICD-10-CM | POA: Diagnosis not present

## 2023-09-19 ENCOUNTER — Ambulatory Visit (HOSPITAL_BASED_OUTPATIENT_CLINIC_OR_DEPARTMENT_OTHER): Payer: 59 | Admitting: Certified Registered"

## 2023-09-19 ENCOUNTER — Encounter (HOSPITAL_COMMUNITY)
Admission: RE | Disposition: A | Discharge status: Home / Self Care | Payer: Self-pay | Source: Home / Self Care | Attending: Gastroenterology

## 2023-09-19 ENCOUNTER — Ambulatory Visit (HOSPITAL_COMMUNITY)
Admission: RE | Admit: 2023-09-19 | Discharge: 2023-09-19 | Disposition: A | Discharge status: Home / Self Care | Payer: 59 | Attending: Gastroenterology | Admitting: Gastroenterology

## 2023-09-19 ENCOUNTER — Ambulatory Visit (HOSPITAL_COMMUNITY): Payer: 59 | Admitting: Certified Registered"

## 2023-09-19 DIAGNOSIS — R1310 Dysphagia, unspecified: Secondary | ICD-10-CM

## 2023-09-19 DIAGNOSIS — F0284 Dementia in other diseases classified elsewhere, unspecified severity, with anxiety: Secondary | ICD-10-CM | POA: Diagnosis not present

## 2023-09-19 DIAGNOSIS — I5032 Chronic diastolic (congestive) heart failure: Secondary | ICD-10-CM | POA: Insufficient documentation

## 2023-09-19 DIAGNOSIS — K648 Other hemorrhoids: Secondary | ICD-10-CM | POA: Insufficient documentation

## 2023-09-19 DIAGNOSIS — F319 Bipolar disorder, unspecified: Secondary | ICD-10-CM | POA: Insufficient documentation

## 2023-09-19 DIAGNOSIS — J449 Chronic obstructive pulmonary disease, unspecified: Secondary | ICD-10-CM | POA: Insufficient documentation

## 2023-09-19 DIAGNOSIS — F17210 Nicotine dependence, cigarettes, uncomplicated: Secondary | ICD-10-CM | POA: Insufficient documentation

## 2023-09-19 DIAGNOSIS — K219 Gastro-esophageal reflux disease without esophagitis: Secondary | ICD-10-CM | POA: Insufficient documentation

## 2023-09-19 DIAGNOSIS — G300 Alzheimer's disease with early onset: Secondary | ICD-10-CM | POA: Insufficient documentation

## 2023-09-19 DIAGNOSIS — K644 Residual hemorrhoidal skin tags: Secondary | ICD-10-CM | POA: Diagnosis not present

## 2023-09-19 DIAGNOSIS — D125 Benign neoplasm of sigmoid colon: Secondary | ICD-10-CM | POA: Diagnosis not present

## 2023-09-19 DIAGNOSIS — E785 Hyperlipidemia, unspecified: Secondary | ICD-10-CM | POA: Diagnosis not present

## 2023-09-19 DIAGNOSIS — K635 Polyp of colon: Secondary | ICD-10-CM | POA: Insufficient documentation

## 2023-09-19 DIAGNOSIS — F17200 Nicotine dependence, unspecified, uncomplicated: Secondary | ICD-10-CM | POA: Diagnosis not present

## 2023-09-19 DIAGNOSIS — I4891 Unspecified atrial fibrillation: Secondary | ICD-10-CM | POA: Insufficient documentation

## 2023-09-19 DIAGNOSIS — K5730 Diverticulosis of large intestine without perforation or abscess without bleeding: Secondary | ICD-10-CM | POA: Insufficient documentation

## 2023-09-19 DIAGNOSIS — Z139 Encounter for screening, unspecified: Secondary | ICD-10-CM | POA: Diagnosis not present

## 2023-09-19 DIAGNOSIS — Z8711 Personal history of peptic ulcer disease: Secondary | ICD-10-CM | POA: Diagnosis not present

## 2023-09-19 DIAGNOSIS — Z1211 Encounter for screening for malignant neoplasm of colon: Secondary | ICD-10-CM | POA: Diagnosis not present

## 2023-09-19 DIAGNOSIS — I110 Hypertensive heart disease with heart failure: Secondary | ICD-10-CM | POA: Insufficient documentation

## 2023-09-19 HISTORY — PX: SAVORY DILATION: SHX5439

## 2023-09-19 HISTORY — PX: POLYPECTOMY: SHX149

## 2023-09-19 HISTORY — PX: ESOPHAGOGASTRODUODENOSCOPY (EGD) WITH PROPOFOL: SHX5813

## 2023-09-19 HISTORY — PX: BIOPSY: SHX5522

## 2023-09-19 HISTORY — PX: COLONOSCOPY WITH PROPOFOL: SHX5780

## 2023-09-19 LAB — HM COLONOSCOPY

## 2023-09-19 SURGERY — COLONOSCOPY WITH PROPOFOL
Anesthesia: General

## 2023-09-19 MED ORDER — PROPOFOL 10 MG/ML IV BOLUS
INTRAVENOUS | Status: DC | PRN
  Administered 2023-09-19: 40 mg via INTRAVENOUS
  Administered 2023-09-19: 30 mg via INTRAVENOUS
  Administered 2023-09-19: 100 mg via INTRAVENOUS

## 2023-09-19 MED ORDER — SODIUM CHLORIDE 0.9% FLUSH: 3.0000 mL | Freq: Two times a day (BID) | INTRAVENOUS | Status: DC

## 2023-09-19 MED ORDER — SODIUM CHLORIDE 0.9% FLUSH: 3.0000 mL | INTRAVENOUS | Status: DC | PRN

## 2023-09-19 MED ORDER — PROPOFOL 500 MG/50ML IV EMUL
INTRAVENOUS | Status: DC | PRN
  Administered 2023-09-19: 150 ug/kg/min via INTRAVENOUS

## 2023-09-19 MED ORDER — LIDOCAINE HCL (PF) 2 % IJ SOLN
INTRAMUSCULAR | Status: DC | PRN
  Administered 2023-09-19: 100 mg via INTRADERMAL

## 2023-09-19 MED ORDER — PHENYLEPHRINE 80 MCG/ML (10ML) SYRINGE FOR IV PUSH (FOR BLOOD PRESSURE SUPPORT)
PREFILLED_SYRINGE | INTRAVENOUS | Status: DC | PRN
  Administered 2023-09-19 (×6): 160 ug via INTRAVENOUS

## 2023-09-19 MED ORDER — LACTATED RINGERS IV SOLN: INTRAVENOUS | Status: DC | PRN

## 2023-09-19 NOTE — Transfer of Care (Signed)
 Immediate Anesthesia Transfer of Care Note  Patient: Kathleen Valenzuela  Procedure(s) Performed: COLONOSCOPY WITH PROPOFOL  ESOPHAGOGASTRODUODENOSCOPY (EGD) WITH PROPOFOL  SAVORY DILATION BIOPSY POLYPECTOMY INTESTINAL  Patient Location: Short Stay  Anesthesia Type:General  Level of Consciousness: drowsy  Airway & Oxygen  Therapy: Patient Spontanous Breathing  Post-op Assessment: Report given to RN and Post -op Vital signs reviewed and stable  Post vital signs: Reviewed and stable  Last Vitals:  Vitals Value Taken Time  BP 143/107 09/19/23 1117  Temp    Pulse 82 09/19/23 1118  Resp 23 09/19/23 1118  SpO2 97 % 09/19/23 1118  Vitals shown include unfiled device data.  Last Pain:  Vitals:   09/19/23 1030  TempSrc:   PainSc: 5       Patients Stated Pain Goal: 5 (09/19/23 0959)  Complications: No notable events documented.

## 2023-09-19 NOTE — Anesthesia Procedure Notes (Signed)
 Date/Time: 09/19/2023 10:33 AM  Performed by: Eliodoro Deward FALCON, CRNAPre-anesthesia Checklist: Patient identified, Emergency Drugs available, Suction available and Patient being monitored Patient Re-evaluated:Patient Re-evaluated prior to induction Oxygen  Delivery Method: Nasal cannula Induction Type: IV induction Placement Confirmation: positive ETCO2 Comments: Optiflow High Flow [place] O2 used.

## 2023-09-19 NOTE — H&P (Signed)
 Kathleen Valenzuela is an 73 y.o. female.   Chief Complaint: dysphagia and colorectal cancer screening.  HPI: Kathleen Valenzuela is a 73 y.o. female with PMH bipolar disorder, COPD, afib,  diastolic heart failure, duodenal stricture, Alzheimer's dementia, atherosclerosis, anxiety, hypertension, hyperlipidemia, GERD complicated by esophagitis and esophageal stricture, who presents for evaluation of dysphagia and colorectal cancer screening.   States she is having dysphagia to liquids and solids for the last couple of years.  Some occasional nausea episodes but no vomiting.  Had diarrhea a few days ago.  The patient denies having any fever, chills, hematochezia, melena, hematemesis, abdominal distention, abdominal pain,jaundice, pruritus or weight loss.  Past Medical History:  Diagnosis Date   Anemia    Anxiety    Aortic atherosclerosis (HCC) 04/21/2017   Arthritis    Bipolar 1 disorder (HCC)    Chronic back pain    COPD (chronic obstructive pulmonary disease) (HCC)    Diastolic dysfunction with heart failure (HCC) 04/20/2017   Duodenal stricture    Early onset Alzheimer's dementia (HCC)    GERD (gastroesophageal reflux disease)    Heart murmur    High cholesterol    Hypertension    Shortness of breath dyspnea     Past Surgical History:  Procedure Laterality Date   BIOPSY  01/06/2017   Procedure: BIOPSY;  Surgeon: Golda Claudis PENNER, MD;  Location: [facility];  Service: Endoscopy;;  esophageal biopsy   BIOPSY  09/11/2022   Procedure: BIOPSY;  Surgeon: Wilhelmenia Aloha Raddle., MD;  Location: [facility] ENDOSCOPY;  Service: Gastroenterology;;   CESAREAN SECTION     COLONOSCOPY     ERCP N/A 09/11/2022   Procedure: ENDOSCOPIC RETROGRADE CHOLANGIOPANCREATOGRAPHY (ERCP);  Surgeon: Wilhelmenia Aloha Raddle., MD;  Location: [facility] ENDOSCOPY;  Service: Gastroenterology;  Laterality: N/A;   ESOPHAGEAL DILATION N/A 10/29/2015   Procedure: ESOPHAGEAL DILATION;  Surgeon: Claudis PENNER Golda, MD;  Location: [facility];   Service: Endoscopy;  Laterality: N/A;   ESOPHAGEAL DILATION N/A 01/29/2016   Procedure: ESOPHAGEAL DILATION;  Surgeon: Claudis PENNER Golda, MD;  Location: [facility];  Service: Endoscopy;  Laterality: N/A;   ESOPHAGEAL DILATION N/A 01/06/2017   Procedure: ESOPHAGEAL DILATION;  Surgeon: Golda Claudis PENNER, MD;  Location: [facility];  Service: Endoscopy;  Laterality: N/A;   ESOPHAGOGASTRODUODENOSCOPY N/A 10/29/2015   Procedure: ESOPHAGOGASTRODUODENOSCOPY (EGD);  Surgeon: Claudis PENNER Golda, MD;  Location: [facility];  Service: Endoscopy;  Laterality: N/A;  1200   ESOPHAGOGASTRODUODENOSCOPY (EGD) WITH PROPOFOL  N/A 01/29/2016   Procedure: ESOPHAGOGASTRODUODENOSCOPY (EGD) WITH PROPOFOL ;  Surgeon: Claudis PENNER Golda, MD;  Location: [facility];  Service: Endoscopy;  Laterality: N/A;  7:30 - moved to 4/21 @11 : 25 - Ann notified pt to arrive at 10:00   ESOPHAGOGASTRODUODENOSCOPY (EGD) WITH PROPOFOL  N/A 01/06/2017   Procedure: ESOPHAGOGASTRODUODENOSCOPY (EGD) WITH PROPOFOL ;  Surgeon: Golda Claudis PENNER, MD;  Location: [facility];  Service: Endoscopy;  Laterality: N/A;  11:20   ESOPHAGOGASTRODUODENOSCOPY (EGD) WITH PROPOFOL  N/A 02/17/2017   Procedure: ESOPHAGOGASTRODUODENOSCOPY (EGD) WITH PROPOFOL ;  Surgeon: Golda Claudis PENNER, MD;  Location: [facility];  Service: Endoscopy;  Laterality: N/A;  10:30   FOOT SURGERY Right    bunionectomy   HEMORRHOID SURGERY     IR EXCHANGE BILIARY DRAIN  10/19/2022   IR EXCHANGE BILIARY DRAIN  12/14/2022   IR EXCHANGE BILIARY DRAIN  02/08/2023   IR PERC CHOLECYSTOSTOMY  08/27/2022   REMOVAL OF STONES  09/11/2022   Procedure: REMOVAL OF STONES;  Surgeon: Wilhelmenia Aloha  Mickey., MD;  Location: [facility] ENDOSCOPY;  Service: Gastroenterology;;   ANNETT  09/11/2022   Procedure: ANNETT;  Surgeon: Wilhelmenia Aloha Mickey., MD;  Location: [facility] ENDOSCOPY;  Service: Gastroenterology;;    No family history on file. Social History:  reports that she has been smoking cigarettes. She  started smoking about 43 years ago. She has a 70 pack-year smoking history. She has never used smokeless tobacco. She reports that she does not drink alcohol and does not use drugs.  Allergies: No Active Allergies  Medications Prior to Admission  Medication Sig Dispense Refill   ACETAMINOPHEN  ER PO Take 650 mg by mouth every 8 (eight) hours as needed for pain.     albuterol  (PROVENTIL  HFA;VENTOLIN  HFA) 108 (90 BASE) MCG/ACT inhaler Inhale 2 puffs into the lungs 4 (four) times daily. (0800, 1200, 1600, & 2000)     atorvastatin  (LIPITOR) 10 MG tablet Take 1 tablet (10 mg total) by mouth daily at 8 pm. Resume on 04/22/20 if LFTs are back to normal (Patient taking differently: Take 40 mg by mouth daily at 8 pm.) 30 tablet 0   benztropine  (COGENTIN ) 0.5 MG tablet Take 0.5 mg by mouth 2 (two) times daily.     diphenhydrAMINE  (BENADRYL ) 25 mg capsule Take 25 mg by mouth every 4 (four) hours as needed.     donepezil  (ARICEPT ) 10 MG tablet Take 10 mg by mouth daily at 8 pm.     doxycycline (ADOXA) 100 MG tablet Take 100 mg by mouth daily.     fluticasone  (FLONASE ) 50 MCG/ACT nasal spray Place 1 spray into both nostrils daily. (0800)     Fluticasone  Furoate-Vilanterol (BREO ELLIPTA ) 200-25 MCG/INH AEPB Inhale 1 puff into the lungs daily. (0800)     furosemide  (LASIX ) 40 MG tablet Take 40 mg by mouth daily. (0800)     gabapentin  (NEURONTIN ) 300 MG capsule Take 300 mg by mouth 3 (three) times daily.     guaifenesin  (ROBITUSSIN) 100 MG/5ML syrup Take 10 mLs by mouth every 6 (six) hours.     isosorbide  mononitrate (IMDUR ) 30 MG 24 hr tablet Take 0.5 tablets (15 mg total) by mouth daily.     LATUDA  80 MG TABS tablet Take 40 mg by mouth at bedtime.     loperamide  (IMODIUM ) 2 MG capsule Take 1 capsule (2 mg total) by mouth 2 (two) times daily as needed for diarrhea or loose stools. 30 capsule 0   memantine  (NAMENDA ) 10 MG tablet Take 10 mg by mouth 2 (two) times daily.     metoprolol  succinate (TOPROL -XL) 25 MG  24 hr tablet Take 1 tablet (25 mg total) by mouth daily.     Olopatadine  HCl (PATADAY ) 0.2 % SOLN Place 1 drop into both eyes daily. (0800)     omeprazole  (PRILOSEC) 40 MG capsule Take 1 capsule (40 mg total) by mouth daily. 90 capsule 3   potassium chloride  SA (KLOR-CON  M) 20 MEQ tablet Take 1 tablet (20 mEq total) by mouth 2 (two) times daily. (Patient taking differently: Take 20 mEq by mouth 2 (two) times daily. Along with a 10 in the am) 4 tablet 0   tiZANidine  (ZANAFLEX ) 2 MG tablet Take 1 tablet (2 mg total) by mouth 2 (two) times daily. (0800 & 2000) 10 tablet 0   traZODone  (DESYREL ) 50 MG tablet Take 50 mg by mouth at bedtime.     ursodiol  (ACTIGALL ) 500 MG tablet Take 1 tablet (500 mg total) by mouth 2 (two) times daily. 180 tablet 3  venlafaxine  XR (EFFEXOR -XR) 150 MG 24 hr capsule Take 300 mg by mouth daily.     apixaban  (ELIQUIS ) 5 MG TABS tablet Take 1 tablet (5 mg total) by mouth 2 (two) times daily. 60 tablet 1   Potassium Chloride  ER 20 MEQ TBCR Take 1 tablet by mouth 2 (two) times daily.      No results found for this or any previous visit (from the past 48 hours). No results found.  Review of Systems  HENT:  Positive for trouble swallowing.   All other systems reviewed and are negative.   Blood pressure 124/74, pulse 84, temperature 98.6 F (37 C), temperature source Oral, resp. rate 18, SpO2 95%. Physical Exam  GENERAL: The patient is AO x3, in no acute distress. HEENT: Head is normocephalic and atraumatic. EOMI are intact. Mouth is well hydrated and without lesions. NECK: Supple. No masses LUNGS: Clear to auscultation. No presence of rhonchi/wheezing/rales. Adequate chest expansion HEART: RRR, normal s1 and s2. ABDOMEN: Soft, nontender, no guarding, no peritoneal signs, and nondistended. BS +. No masses. EXTREMITIES: Without any cyanosis, clubbing, rash, lesions or edema. NEUROLOGIC: AOx3, no focal motor deficit. SKIN: no jaundice, no rashes  Assessment/Plan   ELLAYNA HILLIGOSS is a 73 y.o. female with PMH bipolar disorder, COPD, afib,  diastolic heart failure, duodenal stricture, Alzheimer's dementia, atherosclerosis, anxiety, hypertension, hyperlipidemia, GERD complicated by esophagitis and esophageal stricture, who presents for evaluation of dysphagia and colorectal cancer screening.  We will proceed with EGD and colonoscopy.  Kathleen Eartha Flavors, MD 09/19/2023, 10:22 AM

## 2023-09-19 NOTE — Discharge Instructions (Addendum)
 You are being discharged to home.  Resume your previous diet.  We are waiting for your pathology results.  Your physician has recommended a repeat colonoscopy for surveillance based on pathology results.  Restart Eliquis tomorrow evening.

## 2023-09-19 NOTE — Anesthesia Postprocedure Evaluation (Signed)
 Anesthesia Post Note  Patient: Eva KANDICE Fritter  Procedure(s) Performed: COLONOSCOPY WITH PROPOFOL  ESOPHAGOGASTRODUODENOSCOPY (EGD) WITH PROPOFOL  SAVORY DILATION BIOPSY POLYPECTOMY INTESTINAL  Patient location during evaluation: PACU Anesthesia Type: General Level of consciousness: awake and alert Pain management: pain level controlled Vital Signs Assessment: post-procedure vital signs reviewed and stable Respiratory status: spontaneous breathing, nonlabored ventilation, respiratory function stable and patient connected to nasal cannula oxygen  Cardiovascular status: blood pressure returned to baseline and stable Postop Assessment: no apparent nausea or vomiting Anesthetic complications: no   There were no known notable events for this encounter.   Last Vitals:  Vitals:   09/19/23 0959 09/19/23 1118  BP: 124/74 (!) 143/107  Pulse: 84 81  Resp: 18 20  Temp: 37 C 36.7 C  SpO2: 95% 96%    Last Pain:  Vitals:   09/19/23 1118  TempSrc: Axillary  PainSc: 0-No pain                 Moosa Bueche L Alonnie Bieker

## 2023-09-19 NOTE — Op Note (Signed)
 [facility] Patient Name: Kathleen Valenzuela Procedure Date: 09/19/2023 10:22 AM MRN: [id number] Date of Birth: 07/11/1951 Attending MD: Toribio Fortune , , [id number] CSN: [id number] Age: 73 Admit Type: Outpatient Procedure:                Upper GI endoscopy Indications:              Dysphagia Providers:                Toribio Fortune, Crystal Page, Kristine L. Shirlean Balm, Technician Referring MD:              Medicines:                Monitored Anesthesia Care Complications:            No immediate complications. Estimated Blood Loss:     Estimated blood loss: none. Procedure:                Pre-Anesthesia Assessment:                           - Prior to the procedure, a History and Physical                            was performed, and patient medications, allergies                            and sensitivities were reviewed. The patient's                            tolerance of previous anesthesia was reviewed.                           - The risks and benefits of the procedure and the                            sedation options and risks were discussed with the                            patient. All questions were answered and informed                            consent was obtained.                           - ASA Grade Assessment: III - A patient with severe                            systemic disease.                           After obtaining informed consent, the endoscope was                            passed under direct vision. Throughout the  procedure, the patient's blood pressure, pulse, and                            oxygen  saturations were monitored continuously. The                            [id number]-[id number] ([id number]) scope was introduced through the                            mouth, and advanced to the second part of duodenum.                            The upper GI endoscopy was accomplished without                             difficulty. The patient tolerated the procedure                            well. Scope In: 10:36:37 AM Scope Out: 10:46:14 AM Total Procedure Duration: 0 hours 9 minutes 37 seconds  Findings:      No endoscopic abnormality was evident in the esophagus to explain the       patient's complaint of dysphagia. It was decided, however, to proceed       with dilation of the entire esophagus. A guidewire was placed and the       scope was withdrawn. Dilation was performed with a Savary dilator with       mild resistance at 18 mm. The dilation site was examined following       endoscope reinsertion and showed mild mucosal disruption. Biopsies were       obtained from the proximal and distal esophagus with cold forceps for       histology of suspected eosinophilic esophagitis.      The stomach was normal.      The examined duodenum was normal. Impression:               - No endoscopic esophageal abnormality to explain                            patient's dysphagia. Esophagus dilated. Dilated.                           - Normal stomach.                           - Normal examined duodenum.                           - Biopsies were taken with a cold forceps for                            evaluation of eosinophilic esophagitis. Moderate Sedation:      Per Anesthesia Care Recommendation:           - Discharge patient to home (ambulatory).                           -  Resume previous diet.                           - Await pathology results.                           - If persistent dysphagia, will proceed with                            esophageal manometry. Procedure Code(s):        --- Professional ---                           [id number], Esophagogastroduodenoscopy, flexible,                            transoral; with insertion of guide wire followed by                            passage of dilator(s) through esophagus over guide                            wire                            43239, 59, Esophagogastroduodenoscopy, flexible,                            transoral; with biopsy, single or multiple Diagnosis Code(s):        --- Professional ---                           R13.10, Dysphagia, unspecified CPT copyright 2022 American Medical Association. All rights reserved. The codes documented in this report are preliminary and upon coder review may  be revised to meet current compliance requirements. Toribio Fortune, MD Toribio Fortune,  09/19/2023 10:49:19 AM This report has been signed electronically. Number of Addenda: 0

## 2023-09-19 NOTE — Anesthesia Preprocedure Evaluation (Signed)
 Anesthesia Evaluation  Patient identified by MRN, date of birth, ID bandGeneral Assessment Comment:Patient responds to commands   Reviewed: Allergy & Precautions, NPO status , Patient's Chart, lab work & pertinent test results  Airway Mallampati: III  TM Distance: >3 FB Neck ROM: Full    Dental  (+) Edentulous Upper, Edentulous Lower   Pulmonary shortness of breath, COPD,  COPD inhaler, Current Smoker, former smoker   Pulmonary exam normal        Cardiovascular hypertension, Pt. on home beta blockers + Past MI and +CHF  Normal cardiovascular exam+ dysrhythmias + Valvular Problems/Murmurs AS   Moderate AS.  Diastolic heart failure   Neuro/Psych  PSYCHIATRIC DISORDERS Anxiety  Bipolar Disorder  Dementia negative neurological ROS     GI/Hepatic Neg liver ROS, PUD,GERD  ,,  Endo/Other  negative endocrine ROS    Renal/GU negative Renal ROS     Musculoskeletal  (+) Arthritis ,    Abdominal   Peds  Hematology  (+) Blood dyscrasia   Anesthesia Other Findings Choledocholithiasis  Reproductive/Obstetrics                             Anesthesia Physical Anesthesia Plan  ASA: 3  Anesthesia Plan: General   Post-op Pain Management: Minimal or no pain anticipated   Induction: Intravenous  PONV Risk Score and Plan:   Airway Management Planned: Nasal Cannula and Natural Airway  Additional Equipment: None  Intra-op Plan:   Post-operative Plan:   Informed Consent: I have reviewed the patients History and Physical, chart, labs and discussed the procedure including the risks, benefits and alternatives for the proposed anesthesia with the patient or authorized representative who has indicated his/her understanding and acceptance.       Plan Discussed with: CRNA  Anesthesia Plan Comments:         Anesthesia Quick Evaluation

## 2023-09-19 NOTE — Op Note (Signed)
 [facility] Patient Name: Kathleen Valenzuela Procedure Date: 09/19/2023 10:48 AM MRN: [id number] Date of Birth: 1950-12-20 Attending MD: Toribio Fortune , , [id number] CSN: [id number] Age: 73 Admit Type: Outpatient Procedure:                Colonoscopy Indications:              Screening for colorectal malignant neoplasm Providers:                Toribio Fortune, Crystal Page, Kristine L. Shirlean Balm, Technician Referring MD:              Medicines:                Monitored Anesthesia Care Complications:            No immediate complications. Estimated Blood Loss:     Estimated blood loss: none. Procedure:                Pre-Anesthesia Assessment:                           - Prior to the procedure, a History and Physical                            was performed, and patient medications, allergies                            and sensitivities were reviewed. The patient's                            tolerance of previous anesthesia was reviewed.                           - The risks and benefits of the procedure and the                            sedation options and risks were discussed with the                            patient. All questions were answered and informed                            consent was obtained.                           - ASA Grade Assessment: III - A patient with severe                            systemic disease.                           After obtaining informed consent, the colonoscope                            was passed under direct vision. Throughout the  procedure, the patient's blood pressure, pulse, and                            oxygen  saturations were monitored continuously. The                            [id number]-[id number] ([id number]) scope was introduced through                            the anus and advanced to the the cecum, identified                            by appendiceal orifice and ileocecal  valve. The                            colonoscopy was performed without difficulty. The                            patient tolerated the procedure well. The quality                            of the bowel preparation was adequate. Scope In: 10:51:58 AM Scope Out: 11:12:43 AM Scope Withdrawal Time: 0 hours 15 minutes 15 seconds  Total Procedure Duration: 0 hours 20 minutes 45 seconds  Findings:      Skin tags were found on perianal exam.      Two sessile polyps were found in the sigmoid colon. The polyps were 1 to       8 mm in size. These polyps were removed with a cold snare. Resection and       retrieval were complete.      Scattered small-mouthed diverticula were found in the sigmoid colon.      Non-bleeding internal hemorrhoids were found during retroflexion. The       hemorrhoids were small. Impression:               - Perianal skin tags found on perianal exam.                           - Two 1 to 8 mm polyps in the sigmoid colon,                            removed with a cold snare. Resected and retrieved.                           - Diverticulosis in the sigmoid colon.                           - Non-bleeding internal hemorrhoids. Moderate Sedation:      Per Anesthesia Care Recommendation:           - Discharge patient to home (ambulatory).                           - Resume previous diet.                           -  Await pathology results.                           - Repeat colonoscopy for surveillance based on                            pathology results.                           -Restart Eliquis  tomorrow evening. Procedure Code(s):        --- Professional ---                           [phone], Colonoscopy, flexible; with removal of                            tumor(s), polyp(s), or other lesion(s) by snare                            technique Diagnosis Code(s):        --- Professional ---                           Z12.11, Encounter for screening for malignant                             neoplasm of colon                           D12.5, Benign neoplasm of sigmoid colon                           K64.8, Other hemorrhoids                           K64.4, Residual hemorrhoidal skin tags                           K57.30, Diverticulosis of large intestine without                            perforation or abscess without bleeding CPT copyright 2022 American Medical Association. All rights reserved. The codes documented in this report are preliminary and upon coder review may  be revised to meet current compliance requirements. Toribio Fortune, MD Toribio Fortune,  09/19/2023 11:18:13 AM This report has been signed electronically. Number of Addenda: 0

## 2023-09-20 ENCOUNTER — Encounter (INDEPENDENT_AMBULATORY_CARE_PROVIDER_SITE_OTHER): Payer: Self-pay | Admitting: *Deleted

## 2023-09-20 DIAGNOSIS — R1311 Dysphagia, oral phase: Secondary | ICD-10-CM | POA: Diagnosis not present

## 2023-09-20 DIAGNOSIS — K219 Gastro-esophageal reflux disease without esophagitis: Secondary | ICD-10-CM | POA: Diagnosis not present

## 2023-09-20 DIAGNOSIS — R1310 Dysphagia, unspecified: Secondary | ICD-10-CM | POA: Diagnosis not present

## 2023-09-20 DIAGNOSIS — R5381 Other malaise: Secondary | ICD-10-CM | POA: Diagnosis not present

## 2023-09-20 LAB — SURGICAL PATHOLOGY

## 2023-09-21 ENCOUNTER — Encounter (HOSPITAL_COMMUNITY): Payer: Self-pay | Admitting: Gastroenterology

## 2023-09-21 DIAGNOSIS — R5381 Other malaise: Secondary | ICD-10-CM | POA: Diagnosis not present

## 2023-09-21 DIAGNOSIS — K219 Gastro-esophageal reflux disease without esophagitis: Secondary | ICD-10-CM | POA: Diagnosis not present

## 2023-09-21 DIAGNOSIS — I5030 Unspecified diastolic (congestive) heart failure: Secondary | ICD-10-CM | POA: Diagnosis not present

## 2023-09-21 DIAGNOSIS — M6281 Muscle weakness (generalized): Secondary | ICD-10-CM | POA: Diagnosis not present

## 2023-09-21 DIAGNOSIS — I10 Essential (primary) hypertension: Secondary | ICD-10-CM | POA: Diagnosis not present

## 2023-09-21 DIAGNOSIS — M109 Gout, unspecified: Secondary | ICD-10-CM | POA: Diagnosis not present

## 2023-09-21 DIAGNOSIS — R1311 Dysphagia, oral phase: Secondary | ICD-10-CM | POA: Diagnosis not present

## 2023-09-21 DIAGNOSIS — J449 Chronic obstructive pulmonary disease, unspecified: Secondary | ICD-10-CM | POA: Diagnosis not present

## 2023-10-04 DIAGNOSIS — G8911 Acute pain due to trauma: Secondary | ICD-10-CM | POA: Diagnosis not present

## 2023-10-10 DIAGNOSIS — Z79899 Other long term (current) drug therapy: Secondary | ICD-10-CM | POA: Diagnosis not present

## 2023-10-10 DIAGNOSIS — R231 Pallor: Secondary | ICD-10-CM | POA: Diagnosis not present

## 2023-10-10 DIAGNOSIS — R509 Fever, unspecified: Secondary | ICD-10-CM | POA: Diagnosis not present

## 2023-10-10 DIAGNOSIS — R051 Acute cough: Secondary | ICD-10-CM | POA: Diagnosis not present

## 2023-10-10 DIAGNOSIS — Z20822 Contact with and (suspected) exposure to covid-19: Secondary | ICD-10-CM | POA: Diagnosis not present

## 2023-10-10 DIAGNOSIS — Z743 Need for continuous supervision: Secondary | ICD-10-CM | POA: Diagnosis not present

## 2023-10-10 DIAGNOSIS — Z87891 Personal history of nicotine dependence: Secondary | ICD-10-CM | POA: Diagnosis not present

## 2023-10-10 DIAGNOSIS — N390 Urinary tract infection, site not specified: Secondary | ICD-10-CM | POA: Diagnosis not present

## 2023-10-10 DIAGNOSIS — Z880 Allergy status to penicillin: Secondary | ICD-10-CM | POA: Diagnosis not present

## 2023-10-10 DIAGNOSIS — Z1152 Encounter for screening for COVID-19: Secondary | ICD-10-CM | POA: Diagnosis not present

## 2023-10-10 DIAGNOSIS — J9811 Atelectasis: Secondary | ICD-10-CM | POA: Diagnosis not present

## 2023-10-10 DIAGNOSIS — J449 Chronic obstructive pulmonary disease, unspecified: Secondary | ICD-10-CM | POA: Diagnosis not present

## 2023-10-10 DIAGNOSIS — R410 Disorientation, unspecified: Secondary | ICD-10-CM | POA: Diagnosis not present

## 2023-10-10 DIAGNOSIS — Z720 Tobacco use: Secondary | ICD-10-CM | POA: Diagnosis not present

## 2023-10-11 DIAGNOSIS — Z7401 Bed confinement status: Secondary | ICD-10-CM | POA: Diagnosis not present

## 2023-10-11 DIAGNOSIS — R279 Unspecified lack of coordination: Secondary | ICD-10-CM | POA: Diagnosis not present

## 2023-10-11 DIAGNOSIS — N390 Urinary tract infection, site not specified: Secondary | ICD-10-CM | POA: Diagnosis not present

## 2023-10-13 DIAGNOSIS — I10 Essential (primary) hypertension: Secondary | ICD-10-CM | POA: Diagnosis not present

## 2023-10-13 DIAGNOSIS — R051 Acute cough: Secondary | ICD-10-CM | POA: Diagnosis not present

## 2023-10-13 DIAGNOSIS — J449 Chronic obstructive pulmonary disease, unspecified: Secondary | ICD-10-CM | POA: Diagnosis not present

## 2023-10-13 DIAGNOSIS — R042 Hemoptysis: Secondary | ICD-10-CM | POA: Diagnosis not present

## 2023-10-23 DIAGNOSIS — N390 Urinary tract infection, site not specified: Secondary | ICD-10-CM | POA: Diagnosis not present

## 2023-11-06 DIAGNOSIS — I700 Atherosclerosis of aorta: Secondary | ICD-10-CM | POA: Diagnosis not present

## 2023-11-06 DIAGNOSIS — J449 Chronic obstructive pulmonary disease, unspecified: Secondary | ICD-10-CM | POA: Diagnosis not present

## 2023-11-15 DIAGNOSIS — I10 Essential (primary) hypertension: Secondary | ICD-10-CM | POA: Diagnosis not present

## 2023-11-15 DIAGNOSIS — E790 Hyperuricemia without signs of inflammatory arthritis and tophaceous disease: Secondary | ICD-10-CM | POA: Diagnosis not present

## 2023-11-20 DIAGNOSIS — R1314 Dysphagia, pharyngoesophageal phase: Secondary | ICD-10-CM | POA: Diagnosis not present

## 2023-11-21 DIAGNOSIS — R631 Polydipsia: Secondary | ICD-10-CM | POA: Diagnosis not present

## 2023-11-21 DIAGNOSIS — E119 Type 2 diabetes mellitus without complications: Secondary | ICD-10-CM | POA: Diagnosis not present

## 2023-11-22 DIAGNOSIS — R1314 Dysphagia, pharyngoesophageal phase: Secondary | ICD-10-CM | POA: Diagnosis not present

## 2023-11-22 DIAGNOSIS — N390 Urinary tract infection, site not specified: Secondary | ICD-10-CM | POA: Diagnosis not present

## 2023-11-23 DIAGNOSIS — R1314 Dysphagia, pharyngoesophageal phase: Secondary | ICD-10-CM | POA: Diagnosis not present

## 2023-11-24 DIAGNOSIS — R1314 Dysphagia, pharyngoesophageal phase: Secondary | ICD-10-CM | POA: Diagnosis not present

## 2023-11-28 DIAGNOSIS — R739 Hyperglycemia, unspecified: Secondary | ICD-10-CM | POA: Diagnosis not present

## 2023-11-28 DIAGNOSIS — R631 Polydipsia: Secondary | ICD-10-CM | POA: Diagnosis not present

## 2023-11-28 DIAGNOSIS — R1314 Dysphagia, pharyngoesophageal phase: Secondary | ICD-10-CM | POA: Diagnosis not present

## 2023-11-29 DIAGNOSIS — R1314 Dysphagia, pharyngoesophageal phase: Secondary | ICD-10-CM | POA: Diagnosis not present

## 2023-11-30 DIAGNOSIS — R1314 Dysphagia, pharyngoesophageal phase: Secondary | ICD-10-CM | POA: Diagnosis not present

## 2023-12-01 DIAGNOSIS — R1314 Dysphagia, pharyngoesophageal phase: Secondary | ICD-10-CM | POA: Diagnosis not present

## 2023-12-05 DIAGNOSIS — R1314 Dysphagia, pharyngoesophageal phase: Secondary | ICD-10-CM | POA: Diagnosis not present

## 2023-12-06 DIAGNOSIS — G309 Alzheimer's disease, unspecified: Secondary | ICD-10-CM | POA: Diagnosis not present

## 2023-12-06 DIAGNOSIS — R1314 Dysphagia, pharyngoesophageal phase: Secondary | ICD-10-CM | POA: Diagnosis not present

## 2023-12-07 DIAGNOSIS — R1314 Dysphagia, pharyngoesophageal phase: Secondary | ICD-10-CM | POA: Diagnosis not present

## 2023-12-07 DIAGNOSIS — N390 Urinary tract infection, site not specified: Secondary | ICD-10-CM | POA: Diagnosis not present

## 2023-12-07 DIAGNOSIS — I10 Essential (primary) hypertension: Secondary | ICD-10-CM | POA: Diagnosis not present

## 2023-12-08 DIAGNOSIS — R1314 Dysphagia, pharyngoesophageal phase: Secondary | ICD-10-CM | POA: Diagnosis not present

## 2023-12-11 DIAGNOSIS — R1314 Dysphagia, pharyngoesophageal phase: Secondary | ICD-10-CM | POA: Diagnosis not present

## 2023-12-12 DIAGNOSIS — G309 Alzheimer's disease, unspecified: Secondary | ICD-10-CM | POA: Diagnosis not present

## 2023-12-12 DIAGNOSIS — R1314 Dysphagia, pharyngoesophageal phase: Secondary | ICD-10-CM | POA: Diagnosis not present

## 2023-12-13 DIAGNOSIS — R1314 Dysphagia, pharyngoesophageal phase: Secondary | ICD-10-CM | POA: Diagnosis not present

## 2023-12-13 DIAGNOSIS — R0602 Shortness of breath: Secondary | ICD-10-CM | POA: Diagnosis not present

## 2023-12-14 ENCOUNTER — Telehealth: Payer: Self-pay

## 2023-12-14 ENCOUNTER — Encounter: Payer: Self-pay | Admitting: Neurology

## 2023-12-14 ENCOUNTER — Ambulatory Visit: Admitting: Neurology

## 2023-12-14 VITALS — BP 138/88 | HR 92 | Ht 64.0 in | Wt 170.0 lb

## 2023-12-14 DIAGNOSIS — F02B4 Dementia in other diseases classified elsewhere, moderate, with anxiety: Secondary | ICD-10-CM | POA: Diagnosis not present

## 2023-12-14 DIAGNOSIS — F419 Anxiety disorder, unspecified: Secondary | ICD-10-CM | POA: Diagnosis not present

## 2023-12-14 DIAGNOSIS — R1314 Dysphagia, pharyngoesophageal phase: Secondary | ICD-10-CM | POA: Diagnosis not present

## 2023-12-14 DIAGNOSIS — G300 Alzheimer's disease with early onset: Secondary | ICD-10-CM

## 2023-12-14 NOTE — Patient Instructions (Signed)
 Continue current medications Continue follow-up PCP Return as needed

## 2023-12-14 NOTE — Telephone Encounter (Signed)
 When patient arrived, she was very tearful and was wanting to leave. I was notified by [facility]. I went to check on patient and she complained of pain under both breast. I asked for another staff member to be present. Eather Colas came to exam room and patient lifter shirt and breast and was found to have large area of bilateral appearing yeast fungal infection. Under right breast was very bright red and irritated and left breast irritated but less than right breast. Patient unable to recall if she is being treated for it. I advised I would call her son and the facility and make aware.   Call to son chris,  and [facility] and advised of the above. Shavannah at [facility] took call and would follow up when patient arrived back.

## 2023-12-14 NOTE — Progress Notes (Signed)
 [facility]  PATIENT: Kathleen Valenzuela DOB: 02-06-1951  REQUESTING CLINICIAN: Johnn Hai, NP HISTORY FROM: Chart review/Patient  REASON FOR VISIT: Altered mental status    HISTORICAL  CHIEF COMPLAINT:  Chief Complaint  Patient presents with   New Patient (Initial Visit)    Pt in 12, here alone Pt is referred for abrupt change in mental status.     HISTORY OF PRESENT ILLNESS:  This is 73 year old woman past medical history of dementia, bipolar disorder, anxiety, hypertension, hyperlipidemia who is presenting with change in mental status.  Patient was admitted back in February due to change in mental status, change in her baseline, and was found to have UTI and URI.  She was treated appropriately.  She presents today by herself, no collateral history.  Patient tells me that she does not know why she is here.  Review of chart indicates that she does have a history of dementia, when she was hospitalized her head CT did not show any acute abnormality. Her presentation was most likely due worsening of her dementia due to infection.  She is back to her baseline but she is extremely anxious crying throughout the evaluation and does not know why she is here.  She did tell me that she had a family history of dementia including her mother and brother.  OTHER MEDICAL CONDITIONS: Dementia, Bipolar disorder, Anxiety, Hypertension, Hyperlipidemia    REVIEW OF SYSTEMS: Full 14 system review of systems performed and negative with exception of: As noted in the HPI   ALLERGIES: No Active Allergies  HOME MEDICATIONS: Outpatient Medications Prior to Visit  Medication Sig Dispense Refill   ACETAMINOPHEN ER PO Take 650 mg by mouth every 8 (eight) hours as needed for pain.     allopurinol (ZYLOPRIM) 100 MG tablet Take 100 mg by mouth daily.     aluminum-magnesium hydroxide 200-200 MG/5ML suspension Take by mouth every 6 (six) hours as needed for indigestion.     apixaban  (ELIQUIS) 5 MG TABS tablet Take 1 tablet (5 mg total) by mouth 2 (two) times daily. 60 tablet 1   atorvastatin (LIPITOR) 10 MG tablet Take 1 tablet (10 mg total) by mouth daily at 8 pm. Resume on 04/22/20 if LFTs are back to normal (Patient taking differently: Take 40 mg by mouth daily at 8 pm.) 30 tablet 0   benztropine (COGENTIN) 0.5 MG tablet Take 0.5 mg by mouth 2 (two) times daily.     buPROPion (WELLBUTRIN SR) 150 MG 12 hr tablet Take 150 mg by mouth 2 (two) times daily.     clonazePAM (KLONOPIN) 0.5 MG tablet Take 0.5 mg by mouth 2 (two) times daily as needed for anxiety.     diphenhydrAMINE (BENADRYL) 25 mg capsule Take 25 mg by mouth every 4 (four) hours as needed.     Fluticasone Furoate-Vilanterol (BREO ELLIPTA) 200-25 MCG/INH AEPB Inhale 1 puff into the lungs daily. (0800)     furosemide (LASIX) 40 MG tablet Take 40 mg by mouth daily. (0800)     gabapentin (NEURONTIN) 300 MG capsule Take 300 mg by mouth 3 (three) times daily.     isosorbide mononitrate (IMDUR) 30 MG 24 hr tablet Take 0.5 tablets (15 mg total) by mouth daily.     LATUDA 80 MG TABS tablet Take 40 mg by mouth at bedtime.     loperamide (IMODIUM) 2 MG capsule Take 1 capsule (2 mg total) by mouth 2 (two) times daily as needed for diarrhea or loose  stools. 30 capsule 0   memantine (NAMENDA) 10 MG tablet Take 10 mg by mouth 2 (two) times daily.     metFORMIN (GLUCOPHAGE) 500 MG tablet Take by mouth 2 (two) times daily with a meal.     metoprolol succinate (TOPROL-XL) 25 MG 24 hr tablet Take 1 tablet (25 mg total) by mouth daily.     Olopatadine HCl (PATADAY) 0.2 % SOLN Place 1 drop into both eyes daily. (0800)     omeprazole (PRILOSEC) 40 MG capsule Take 1 capsule (40 mg total) by mouth daily. 90 capsule 3   Potassium Chloride ER 20 MEQ TBCR Take 1 tablet by mouth 2 (two) times daily.     potassium chloride SA (KLOR-CON M) 20 MEQ tablet Take 1 tablet (20 mEq total) by mouth 2 (two) times daily. (Patient taking differently: Take  20 mEq by mouth 2 (two) times daily. Along with a 10 in the am) 4 tablet 0   tiZANidine (ZANAFLEX) 2 MG tablet Take 1 tablet (2 mg total) by mouth 2 (two) times daily. (0800 & 2000) 10 tablet 0   traZODone (DESYREL) 50 MG tablet Take 50 mg by mouth at bedtime.     ursodiol (ACTIGALL) 500 MG tablet Take 1 tablet (500 mg total) by mouth 2 (two) times daily. 180 tablet 3   venlafaxine XR (EFFEXOR-XR) 150 MG 24 hr capsule Take 300 mg by mouth daily.     albuterol (PROVENTIL HFA;VENTOLIN HFA) 108 (90 BASE) MCG/ACT inhaler Inhale 2 puffs into the lungs 4 (four) times daily. (0800, 1200, 1600, & 2000) (Patient not taking: Reported on 12/14/2023)     donepezil (ARICEPT) 10 MG tablet Take 10 mg by mouth daily at 8 pm.     doxycycline (ADOXA) 100 MG tablet Take 100 mg by mouth daily.     fluticasone (FLONASE) 50 MCG/ACT nasal spray Place 1 spray into both nostrils daily. (0800)     guaifenesin (ROBITUSSIN) 100 MG/5ML syrup Take 10 mLs by mouth every 6 (six) hours. (Patient not taking: Reported on 12/14/2023)     lurasidone (LATUDA) 40 MG TABS tablet Take 40 mg by mouth daily. (Patient not taking: Reported on 12/14/2023)     No facility-administered medications prior to visit.    PAST MEDICAL HISTORY: Past Medical History:  Diagnosis Date   Anemia    Anxiety    Aortic atherosclerosis (HCC) 04/21/2017   Arthritis    Bipolar 1 disorder (HCC)    Chronic back pain    COPD (chronic obstructive pulmonary disease) (HCC)    Diastolic dysfunction with heart failure (HCC) 04/20/2017   Duodenal stricture    Early onset Alzheimer's dementia (HCC)    GERD (gastroesophageal reflux disease)    Heart murmur    High cholesterol    Hypertension    Shortness of breath dyspnea     PAST SURGICAL HISTORY: Past Surgical History:  Procedure Laterality Date   BIOPSY  01/06/2017   Procedure: BIOPSY;  Surgeon: Malissa Hippo, MD;  [facility];  Service: Endoscopy;;  esophageal biopsy   BIOPSY  09/11/2022    Procedure: BIOPSY;  Surgeon: Lemar Lofty., MD;  Location: [facility] ENDOSCOPY;  Service: Gastroenterology;;   BIOPSY  09/19/2023   Procedure: BIOPSY;  Surgeon: Dolores Frame, MD;  [facility]: [facility];  Service: Gastroenterology;;   CESAREAN SECTION     COLONOSCOPY     COLONOSCOPY WITH PROPOFOL N/A 09/19/2023   Procedure: COLONOSCOPY WITH PROPOFOL;  Surgeon: Dolores Frame, MD;  [facility];  Service: Gastroenterology;  Laterality: N/A;  11:15AM;ASA 3   ERCP N/A 09/11/2022   Procedure: ENDOSCOPIC RETROGRADE CHOLANGIOPANCREATOGRAPHY (ERCP);  Surgeon: Lemar Lofty., MD;  Location: [facility] ENDOSCOPY;  Service: Gastroenterology;  Laterality: N/A;   ESOPHAGEAL DILATION N/A 10/29/2015   Procedure: ESOPHAGEAL DILATION;  Surgeon: Malissa Hippo, MD;  [facility];  Service: Endoscopy;  Laterality: N/A;   ESOPHAGEAL DILATION N/A 01/29/2016   Procedure: ESOPHAGEAL DILATION;  Surgeon: Malissa Hippo, MD;  [facility];  Service: Endoscopy;  Laterality: N/A;   ESOPHAGEAL DILATION N/A 01/06/2017   Procedure: ESOPHAGEAL DILATION;  Surgeon: Malissa Hippo, MD;  [facility];  Service: Endoscopy;  Laterality: N/A;   ESOPHAGOGASTRODUODENOSCOPY N/A 10/29/2015   Procedure: ESOPHAGOGASTRODUODENOSCOPY (EGD);  Surgeon: Malissa Hippo, MD;  [facility];  Service: Endoscopy;  Laterality: N/A;  1200   ESOPHAGOGASTRODUODENOSCOPY (EGD) WITH PROPOFOL N/A 01/29/2016   Procedure: ESOPHAGOGASTRODUODENOSCOPY (EGD) WITH PROPOFOL;  Surgeon: Malissa Hippo, MD;  [facility];  Service: Endoscopy;  Laterality: N/A;  7:30 - moved to 4/21 @11 : 25 - Ann notified pt to arrive at 10:00   ESOPHAGOGASTRODUODENOSCOPY (EGD) WITH PROPOFOL N/A 01/06/2017   Procedure: ESOPHAGOGASTRODUODENOSCOPY (EGD) WITH PROPOFOL;  Surgeon: Malissa Hippo, MD;  [facility];  Service: Endoscopy;  Laterality: N/A;  11:20   ESOPHAGOGASTRODUODENOSCOPY  (EGD) WITH PROPOFOL N/A 02/17/2017   Procedure: ESOPHAGOGASTRODUODENOSCOPY (EGD) WITH PROPOFOL;  Surgeon: Malissa Hippo, MD;  [facility];  Service: Endoscopy;  Laterality: N/A;  10:30   ESOPHAGOGASTRODUODENOSCOPY (EGD) WITH PROPOFOL N/A 09/19/2023   Procedure: ESOPHAGOGASTRODUODENOSCOPY (EGD) WITH PROPOFOL;  Surgeon: Dolores Frame, MD;  [facility];  Service: Gastroenterology;  Laterality: N/A;  11:15AM;ASA 3   FOOT SURGERY Right    bunionectomy   HEMORRHOID SURGERY     IR EXCHANGE BILIARY DRAIN  10/19/2022   IR EXCHANGE BILIARY DRAIN  12/14/2022   IR EXCHANGE BILIARY DRAIN  02/08/2023   IR PERC CHOLECYSTOSTOMY  08/27/2022   POLYPECTOMY  09/19/2023   Procedure: POLYPECTOMY INTESTINAL;  Surgeon: Dolores Frame, MD;  [facility];  Service: Gastroenterology;;   REMOVAL OF STONES  09/11/2022   Procedure: REMOVAL OF STONES;  Surgeon: Lemar Lofty., MD;  Location: [facility] ENDOSCOPY;  Service: Gastroenterology;;   Gaspar Bidding DILATION  09/19/2023   Procedure: Gaspar Bidding DILATION;  Surgeon: Dolores Frame, MD;  [facility];  Service: Gastroenterology;;   Dennison Mascot  09/11/2022   Procedure: Dennison Mascot;  Surgeon: Lemar Lofty., MD;  Location: [facility] ENDOSCOPY;  Service: Gastroenterology;;    FAMILY HISTORY: History reviewed. No pertinent family history.  SOCIAL HISTORY: Social History   Socioeconomic History   Marital status: Divorced    Spouse name: Not on file   Number of children: Not on file   Years of education: Not on file   Highest education level: Not on file  Occupational History   Not on file  Tobacco Use   Smoking status: Every Day    Current packs/day: 0.00    Average packs/day: 2.0 packs/day for 35.0 years (70.0 ttl pk-yrs)    Types: Cigarettes    Start date: 07/24/1980    Last attempt to quit: 07/25/2015    Years since quitting: 8.3   Smokeless tobacco: Never   Tobacco comments:     quit 2 weeks ago (November 2016)  Vaping Use   Vaping status: Never Used  Substance and Sexual  Activity   Alcohol use: No    Alcohol/week: 0.0 standard drinks of alcohol   Drug use: No   Sexual activity: Not Currently    Birth control/protection: None  Other Topics Concern   Not on file  Social History Narrative   Not on file   Social Drivers of Health   Financial Resource Strain: Not on file  Food Insecurity: No Food Insecurity (09/01/2022)   Hunger Vital Sign    Worried About Running Out of Food in the Last Year: Never true    Ran Out of Food in the Last Year: Never true  Transportation Needs: No Transportation Needs (09/01/2022)   PRAPARE - Administrator, Civil Service (Medical): No    Lack of Transportation (Non-Medical): No  Physical Activity: Not on file  Stress: Not on file  Social Connections: Not on file  Intimate Partner Violence: Not At Risk (09/01/2022)   Humiliation, Afraid, Rape, and Kick questionnaire    Fear of Current or Ex-Partner: No    Emotionally Abused: No    Physically Abused: No    Sexually Abused: No     PHYSICAL EXAM  GENERAL EXAM/CONSTITUTIONAL: Vitals:  Vitals:   12/14/23 0810  BP: 138/88  Pulse: 92  Weight: 170 lb (77.1 kg)  Height: 5\' 4"  (1.626 m)   Body mass index is 29.18 kg/m. Wt Readings from Last 3 Encounters:  12/14/23 170 lb (77.1 kg)  09/12/23 195 lb (88.5 kg)  06/15/23 186 lb 3.2 oz (84.5 kg)   Patient is in no distress; well developed, nourished and groomed; neck is supple  MUSCULOSKELETAL: Gait, strength, tone, movements noted in Neurologic exam below  NEUROLOGIC: MENTAL STATUS:     12/14/2023    7:56 AM  MMSE - Mini Mental State Exam  Orientation to time 1  Orientation to Place 3  Registration 3  Attention/ Calculation 3  Recall 0  Language- name 2 objects 2  Language- repeat 1  Language- follow 3 step command 3  Language- read & follow direction 1  Write a sentence 0  Copy design 0   Total score 17   awake, alert, extremely anxious, crying, wondering when she can leave.   CRANIAL NERVE:  2nd, 3rd, 4th, 6th - Visual fields full to confrontation, extraocular muscles intact, no nystagmus 5th - facial sensation symmetric 7th - facial strength symmetric 8th - hearing intact 9th - palate elevates symmetrically, uvula midline 11th - shoulder shrug symmetric 12th - tongue protrusion midline  MOTOR:  normal bulk and tone, full strength in the BUE, BLE  GAIT/STATION:  Uses a walker     DIAGNOSTIC DATA (LABS, IMAGING, TESTING) - I reviewed patient records, labs, notes, testing and imaging myself where available.  Lab Results  Component Value Date   WBC 14.3 (H) 02/15/2023   HGB 13.7 02/15/2023   HCT 39.5 02/15/2023   MCV 87.6 02/15/2023   PLT 240 02/15/2023      Component Value Date/Time   NA 139 02/15/2023 1706   K 2.7 (LL) 02/15/2023 1706   CL 99 02/15/2023 1706   CO2 25 02/15/2023 1706   GLUCOSE 152 (H) 02/15/2023 1706   BUN 11 02/15/2023 1706   CREATININE 0.94 02/15/2023 1706   CALCIUM 9.1 02/15/2023 1706   PROT 7.6 02/15/2023 1706   ALBUMIN 3.8 02/15/2023 1706   AST 18 02/15/2023 1706   ALT 12 02/15/2023 1706   ALKPHOS 100 02/15/2023 1706   BILITOT 0.6 02/15/2023 1706   GFRNONAA >60  02/15/2023 1706   GFRAA >60 04/15/2020 0413   No results found for: "CHOL", "HDL", "LDLCALC", "LDLDIRECT", "TRIG", "CHOLHDL" Lab Results  Component Value Date   HGBA1C 5.2 04/15/2017   No results found for: "VITAMINB12" Lab Results  Component Value Date   TSH 0.225 (L) 04/13/2020   Head CT 10/10/2023 1. No acute intracranial hemorrhage or infarct.  2. Mild senescent change.    ASSESSMENT AND PLAN  73 y.o. year old female with history of dementia, bipolar disorder, anxiety, depression, hypertension and hyperlipidemia who is presenting for worsening mental status.  Patient was admitted in January in the setting of UTI and URI/Influenza .  Her infection was  treated appropriately.  She tells me that she is not sure why she is here, she does have a history of dementia, and family history of dementia.   Patient with dementia will have worsening of their memory, this is a progression the nature of the disease and we expect this to get worse over time.  On top of that if they have any type of illness or infections, particularly UTI, it can make their memory worse, it can cause confusion, altered mental status, and even hallucinations.  The recommendation is for any patient with dementia when they present with worsening symptoms to rule out infection.  At this point no further neurological workup indicated, patient can continue to follow with primary care.    1. 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   No orders of the defined types were placed in this encounter.   Return if symptoms worsen or fail to improve.    Windell Norfolk, MD 12/14/2023, 10:59 AM  [facility] [address] [place], [place] [address] [phone]

## 2023-12-15 DIAGNOSIS — R1314 Dysphagia, pharyngoesophageal phase: Secondary | ICD-10-CM | POA: Diagnosis not present

## 2023-12-16 DIAGNOSIS — E1159 Type 2 diabetes mellitus with other circulatory complications: Secondary | ICD-10-CM | POA: Diagnosis not present

## 2023-12-16 DIAGNOSIS — B351 Tinea unguium: Secondary | ICD-10-CM | POA: Diagnosis not present

## 2023-12-18 DIAGNOSIS — D72829 Elevated white blood cell count, unspecified: Secondary | ICD-10-CM | POA: Diagnosis not present

## 2023-12-18 DIAGNOSIS — R1314 Dysphagia, pharyngoesophageal phase: Secondary | ICD-10-CM | POA: Diagnosis not present

## 2023-12-18 DIAGNOSIS — I10 Essential (primary) hypertension: Secondary | ICD-10-CM | POA: Diagnosis not present

## 2023-12-18 DIAGNOSIS — J449 Chronic obstructive pulmonary disease, unspecified: Secondary | ICD-10-CM | POA: Diagnosis not present

## 2023-12-18 DIAGNOSIS — E119 Type 2 diabetes mellitus without complications: Secondary | ICD-10-CM | POA: Diagnosis not present

## 2023-12-19 DIAGNOSIS — R1314 Dysphagia, pharyngoesophageal phase: Secondary | ICD-10-CM | POA: Diagnosis not present

## 2023-12-19 DIAGNOSIS — D72829 Elevated white blood cell count, unspecified: Secondary | ICD-10-CM | POA: Diagnosis not present

## 2023-12-19 DIAGNOSIS — G309 Alzheimer's disease, unspecified: Secondary | ICD-10-CM | POA: Diagnosis not present

## 2023-12-20 DIAGNOSIS — R1314 Dysphagia, pharyngoesophageal phase: Secondary | ICD-10-CM | POA: Diagnosis not present

## 2023-12-20 DIAGNOSIS — E039 Hypothyroidism, unspecified: Secondary | ICD-10-CM | POA: Diagnosis not present

## 2023-12-28 DIAGNOSIS — H04123 Dry eye syndrome of bilateral lacrimal glands: Secondary | ICD-10-CM | POA: Diagnosis not present

## 2023-12-28 DIAGNOSIS — H25813 Combined forms of age-related cataract, bilateral: Secondary | ICD-10-CM | POA: Diagnosis not present

## 2023-12-28 DIAGNOSIS — H524 Presbyopia: Secondary | ICD-10-CM | POA: Diagnosis not present

## 2023-12-28 DIAGNOSIS — H1045 Other chronic allergic conjunctivitis: Secondary | ICD-10-CM | POA: Diagnosis not present

## 2024-01-08 DIAGNOSIS — H524 Presbyopia: Secondary | ICD-10-CM | POA: Diagnosis not present

## 2024-01-12 ENCOUNTER — Other Ambulatory Visit (HOSPITAL_COMMUNITY): Payer: Self-pay | Admitting: Nurse Practitioner

## 2024-01-12 DIAGNOSIS — R109 Unspecified abdominal pain: Secondary | ICD-10-CM

## 2024-01-17 ENCOUNTER — Ambulatory Visit (HOSPITAL_COMMUNITY): Admission: RE | Admit: 2024-01-17 | Source: Ambulatory Visit

## 2024-01-23 ENCOUNTER — Ambulatory Visit (HOSPITAL_COMMUNITY)

## 2024-01-24 DIAGNOSIS — R197 Diarrhea, unspecified: Secondary | ICD-10-CM | POA: Diagnosis not present

## 2024-01-24 DIAGNOSIS — K811 Chronic cholecystitis: Secondary | ICD-10-CM | POA: Diagnosis not present

## 2024-01-24 DIAGNOSIS — E119 Type 2 diabetes mellitus without complications: Secondary | ICD-10-CM | POA: Diagnosis not present

## 2024-01-24 DIAGNOSIS — R5381 Other malaise: Secondary | ICD-10-CM | POA: Diagnosis not present

## 2024-01-31 ENCOUNTER — Encounter (HOSPITAL_COMMUNITY): Payer: Self-pay

## 2024-01-31 ENCOUNTER — Ambulatory Visit (HOSPITAL_COMMUNITY)
Admission: RE | Admit: 2024-01-31 | Discharge: 2024-01-31 | Disposition: A | Discharge status: Home / Self Care | Source: Ambulatory Visit | Attending: Nurse Practitioner | Admitting: Nurse Practitioner

## 2024-01-31 DIAGNOSIS — R109 Unspecified abdominal pain: Secondary | ICD-10-CM | POA: Insufficient documentation

## 2024-01-31 DIAGNOSIS — R5381 Other malaise: Secondary | ICD-10-CM | POA: Diagnosis not present

## 2024-01-31 DIAGNOSIS — R197 Diarrhea, unspecified: Secondary | ICD-10-CM | POA: Diagnosis not present

## 2024-01-31 DIAGNOSIS — R110 Nausea: Secondary | ICD-10-CM | POA: Diagnosis not present

## 2024-01-31 DIAGNOSIS — K811 Chronic cholecystitis: Secondary | ICD-10-CM | POA: Diagnosis not present

## 2024-01-31 MED ORDER — TECHNETIUM TC 99M MEBROFENIN IV KIT
5.0000 | PACK | Freq: Once | INTRAVENOUS | Status: AC | PRN
  Administered 2024-01-31: 5 via INTRAVENOUS

## 2024-01-31 MED ORDER — MORPHINE SULFATE (PF) 4 MG/ML IV SOLN
3.0000 mg | Freq: Once | INTRAVENOUS | Status: AC
  Administered 2024-01-31: 3 mg via INTRAVENOUS
  Filled 2024-01-31: qty 1
  Filled 2024-01-31: qty 0.8

## 2024-02-01 DIAGNOSIS — H524 Presbyopia: Secondary | ICD-10-CM | POA: Diagnosis not present

## 2024-02-13 DIAGNOSIS — K811 Chronic cholecystitis: Secondary | ICD-10-CM | POA: Diagnosis not present

## 2024-02-13 DIAGNOSIS — K219 Gastro-esophageal reflux disease without esophagitis: Secondary | ICD-10-CM | POA: Diagnosis not present

## 2024-02-13 DIAGNOSIS — I10 Essential (primary) hypertension: Secondary | ICD-10-CM | POA: Diagnosis not present

## 2024-02-13 DIAGNOSIS — R197 Diarrhea, unspecified: Secondary | ICD-10-CM | POA: Diagnosis not present

## 2024-02-18 ENCOUNTER — Emergency Department (HOSPITAL_COMMUNITY)
Admission: EM | Admit: 2024-02-18 | Discharge: 2024-02-19 | Disposition: A | Discharge status: Home / Self Care | Attending: Emergency Medicine | Admitting: Emergency Medicine

## 2024-02-18 ENCOUNTER — Emergency Department (HOSPITAL_COMMUNITY)

## 2024-02-18 ENCOUNTER — Other Ambulatory Visit: Payer: Self-pay

## 2024-02-18 DIAGNOSIS — I5030 Unspecified diastolic (congestive) heart failure: Secondary | ICD-10-CM | POA: Diagnosis not present

## 2024-02-18 DIAGNOSIS — R1032 Left lower quadrant pain: Secondary | ICD-10-CM | POA: Diagnosis not present

## 2024-02-18 DIAGNOSIS — I700 Atherosclerosis of aorta: Secondary | ICD-10-CM | POA: Diagnosis not present

## 2024-02-18 DIAGNOSIS — F0280 Dementia in other diseases classified elsewhere without behavioral disturbance: Secondary | ICD-10-CM | POA: Diagnosis not present

## 2024-02-18 DIAGNOSIS — J449 Chronic obstructive pulmonary disease, unspecified: Secondary | ICD-10-CM | POA: Insufficient documentation

## 2024-02-18 DIAGNOSIS — J9811 Atelectasis: Secondary | ICD-10-CM | POA: Diagnosis not present

## 2024-02-18 DIAGNOSIS — I10 Essential (primary) hypertension: Secondary | ICD-10-CM | POA: Diagnosis not present

## 2024-02-18 DIAGNOSIS — G300 Alzheimer's disease with early onset: Secondary | ICD-10-CM | POA: Diagnosis not present

## 2024-02-18 DIAGNOSIS — I110 Hypertensive heart disease with heart failure: Secondary | ICD-10-CM | POA: Insufficient documentation

## 2024-02-18 DIAGNOSIS — F17210 Nicotine dependence, cigarettes, uncomplicated: Secondary | ICD-10-CM | POA: Diagnosis not present

## 2024-02-18 DIAGNOSIS — R079 Chest pain, unspecified: Secondary | ICD-10-CM | POA: Diagnosis not present

## 2024-02-18 DIAGNOSIS — R58 Hemorrhage, not elsewhere classified: Secondary | ICD-10-CM | POA: Diagnosis not present

## 2024-02-18 DIAGNOSIS — R109 Unspecified abdominal pain: Secondary | ICD-10-CM | POA: Diagnosis not present

## 2024-02-18 LAB — LIPASE, BLOOD: Lipase: 28 U/L (ref 11–51)

## 2024-02-18 LAB — CBC
HCT: 34.3 % — ABNORMAL LOW (ref 36.0–46.0)
Hemoglobin: 11.6 g/dL — ABNORMAL LOW (ref 12.0–15.0)
MCH: 31.4 pg (ref 26.0–34.0)
MCHC: 33.8 g/dL (ref 30.0–36.0)
MCV: 92.7 fL (ref 80.0–100.0)
Platelets: 378 10*3/uL (ref 150–400)
RBC: 3.7 MIL/uL — ABNORMAL LOW (ref 3.87–5.11)
RDW: 14.6 % (ref 11.5–15.5)
WBC: 17.2 10*3/uL — ABNORMAL HIGH (ref 4.0–10.5)
nRBC: 0 % (ref 0.0–0.2)

## 2024-02-18 LAB — COMPREHENSIVE METABOLIC PANEL WITH GFR
ALT: 6 U/L (ref 0–44)
AST: 17 U/L (ref 15–41)
Albumin: 2.8 g/dL — ABNORMAL LOW (ref 3.5–5.0)
Alkaline Phosphatase: 123 U/L (ref 38–126)
Anion gap: 14 (ref 5–15)
BUN: 13 mg/dL (ref 8–23)
CO2: 27 mmol/L (ref 22–32)
Calcium: 9.3 mg/dL (ref 8.9–10.3)
Chloride: 97 mmol/L — ABNORMAL LOW (ref 98–111)
Creatinine, Ser: 0.98 mg/dL (ref 0.44–1.00)
GFR, Estimated: >60 mL/min (ref >60)
Glucose, Bld: 121 mg/dL — ABNORMAL HIGH (ref 70–99)
Potassium: 2.8 mmol/L — ABNORMAL LOW (ref 3.5–5.1)
Sodium: 138 mmol/L (ref 135–145)
Total Bilirubin: 0.4 mg/dL (ref 0.0–1.2)
Total Protein: 6.8 g/dL (ref 6.5–8.1)

## 2024-02-18 MED ORDER — ALUM & MAG HYDROXIDE-SIMETH 200-200-20 MG/5ML PO SUSP
30.0000 mL | Freq: Once | ORAL | Status: AC
  Administered 2024-02-18: 30 mL via ORAL
  Filled 2024-02-18: qty 30

## 2024-02-18 MED ORDER — ACETAMINOPHEN 500 MG PO TABS
1000.0000 mg | ORAL_TABLET | Freq: Once | ORAL | Status: AC
  Administered 2024-02-18: 1000 mg via ORAL
  Filled 2024-02-18: qty 2

## 2024-02-18 NOTE — ED Triage Notes (Signed)
 Pt bib [facility] from [facility] c/o LLQ pain. Hx of dementia, poor historian.

## 2024-02-19 ENCOUNTER — Emergency Department (HOSPITAL_COMMUNITY)

## 2024-02-19 DIAGNOSIS — N281 Cyst of kidney, acquired: Secondary | ICD-10-CM | POA: Diagnosis not present

## 2024-02-19 DIAGNOSIS — K5750 Diverticulosis of both small and large intestine without perforation or abscess without bleeding: Secondary | ICD-10-CM | POA: Diagnosis not present

## 2024-02-19 DIAGNOSIS — R1032 Left lower quadrant pain: Secondary | ICD-10-CM | POA: Diagnosis not present

## 2024-02-19 DIAGNOSIS — R932 Abnormal findings on diagnostic imaging of liver and biliary tract: Secondary | ICD-10-CM | POA: Diagnosis not present

## 2024-02-19 LAB — URINALYSIS, ROUTINE W REFLEX MICROSCOPIC
Bilirubin Urine: NEGATIVE
Glucose, UA: NEGATIVE mg/dL
Hgb urine dipstick: NEGATIVE
Ketones, ur: NEGATIVE mg/dL
Leukocytes,Ua: NEGATIVE
Nitrite: NEGATIVE
Protein, ur: NEGATIVE mg/dL
Specific Gravity, Urine: 1.033 — ABNORMAL HIGH (ref 1.005–1.030)
pH: 7 (ref 5.0–8.0)

## 2024-02-19 LAB — TROPONIN I (HIGH SENSITIVITY)
Troponin I (High Sensitivity): 6 ng/L (ref <18)
Troponin I (High Sensitivity): 6 ng/L (ref <18)

## 2024-02-19 MED ORDER — IOHEXOL 300 MG/ML  SOLN
100.0000 mL | Freq: Once | INTRAMUSCULAR | Status: AC | PRN
Start: 2024-02-19 — End: 2024-02-19
  Administered 2024-02-19: 100 mL via INTRAVENOUS

## 2024-02-19 MED ORDER — LORAZEPAM 2 MG/ML IJ SOLN: 2.0000 mg | Freq: Once | INTRAMUSCULAR | Status: DC

## 2024-02-19 MED ORDER — POTASSIUM CHLORIDE CRYS ER 20 MEQ PO TBCR
40.0000 meq | EXTENDED_RELEASE_TABLET | Freq: Once | ORAL | Status: AC
  Administered 2024-02-19: 40 meq via ORAL
  Filled 2024-02-19: qty 2

## 2024-02-19 NOTE — ED Notes (Signed)
 Pt assisted with using phone to call and talk with her son

## 2024-02-19 NOTE — ED Notes (Signed)
 This nurse spoke with Lynnie Saucier, staff at [facility] in [place] informing that pt was up for discharge and needed transport back there- informed that EMS is not responsible for transport back, only taking pt to ED. Tonya says to give her some time to try and find transportation for pt. Reports will call back

## 2024-02-19 NOTE — ED Provider Notes (Signed)
 [facility]-EMERGENCY DEPT [facility] Emergency Department Provider Note MRN:  [id number]  Arrival date & time: 02/19/24     Chief Complaint   Abdominal Pain   History of Present Illness   Kathleen Valenzuela is a 73 y.o. year-old female with history of dementia presenting to the ED with chief complaint of abdominal pain.  Patient explains she is recently getting over a respiratory illness but now she is having left lower quadrant abdominal pain for the past few days, not going away.  Associated with nausea vomiting and diarrhea.  Denies fever.  Review of Systems  A thorough review of systems was obtained and all systems are negative except as noted in the HPI and PMH.   Patient's Health History    Past Medical History:  Diagnosis Date   Anemia    Anxiety    Aortic atherosclerosis (HCC) 04/21/2017   Arthritis    Bipolar 1 disorder (HCC)    Chronic back pain    COPD (chronic obstructive pulmonary disease) (HCC)    Diastolic dysfunction with heart failure (HCC) 04/20/2017   Duodenal stricture    Early onset Alzheimer's dementia (HCC)    GERD (gastroesophageal reflux disease)    Heart murmur    High cholesterol    Hypertension    Shortness of breath dyspnea     Past Surgical History:  Procedure Laterality Date   BIOPSY  01/06/2017   Procedure: BIOPSY;  Surgeon: Ruby Corporal, MD;  Location: [facility];  Service: Endoscopy;;  esophageal biopsy   BIOPSY  09/11/2022   Procedure: BIOPSY;  Surgeon: Normie Becton., MD;  Location: [facility] ENDOSCOPY;  Service: Gastroenterology;;   BIOPSY  09/19/2023   Procedure: BIOPSY;  Surgeon: Urban Garden, MD;  Location: [facility];  Service: Gastroenterology;;   CESAREAN SECTION     COLONOSCOPY     COLONOSCOPY WITH PROPOFOL  N/A 09/19/2023   Procedure: COLONOSCOPY WITH PROPOFOL ;  Surgeon: Urban Garden, MD;  Location[facility];  Service: Gastroenterology;  Laterality: N/A;  11:15AM;ASA 3   ERCP N/A 09/11/2022    Procedure: ENDOSCOPIC RETROGRADE CHOLANGIOPANCREATOGRAPHY (ERCP);  Surgeon: Normie Becton., MD;  Location: [facility] ENDOSCOPY;  Service: Gastroenterology;  Laterality: N/A;   ESOPHAGEAL DILATION N/A 10/29/2015   Procedure: ESOPHAGEAL DILATION;  Surgeon: Ruby Corporal, MD;  [facility];  Service: Endoscopy;  Laterality: N/A;   ESOPHAGEAL DILATION N/A 01/29/2016   Procedure: ESOPHAGEAL DILATION;  Surgeon: Ruby Corporal, MD;  [facility];  Service: Endoscopy;  Laterality: N/A;   ESOPHAGEAL DILATION N/A 01/06/2017   Procedure: ESOPHAGEAL DILATION;  Surgeon: Ruby Corporal, MD;  [facility];  Service: Endoscopy;  Laterality: N/A;   ESOPHAGOGASTRODUODENOSCOPY N/A 10/29/2015   Procedure: ESOPHAGOGASTRODUODENOSCOPY (EGD);  Surgeon: Ruby Corporal, MD;  [facility];  Service: Endoscopy;  Laterality: N/A;  1200   ESOPHAGOGASTRODUODENOSCOPY (EGD) WITH PROPOFOL  N/A 01/29/2016   Procedure: ESOPHAGOGASTRODUODENOSCOPY (EGD) WITH PROPOFOL ;  Surgeon: Ruby Corporal, MD;  [facility];  Service: Endoscopy;  Laterality: N/A;  7:30 - moved to 4/21 @11 : 25 - Ann notified pt to arrive at 10:00   ESOPHAGOGASTRODUODENOSCOPY (EGD) WITH PROPOFOL  N/A 01/06/2017   Procedure: ESOPHAGOGASTRODUODENOSCOPY (EGD) WITH PROPOFOL ;  Surgeon: Ruby Corporal, MD;  [facility];  Service: Endoscopy;  Laterality: N/A;  11:20   ESOPHAGOGASTRODUODENOSCOPY (EGD) WITH PROPOFOL  N/A 02/17/2017   Procedure: ESOPHAGOGASTRODUODENOSCOPY (EGD) WITH PROPOFOL ;  Surgeon: Ruby Corporal, MD;  [facility];  Service: Endoscopy;  Laterality: N/A;  10:30   ESOPHAGOGASTRODUODENOSCOPY (EGD) WITH PROPOFOL  N/A 09/19/2023   Procedure: ESOPHAGOGASTRODUODENOSCOPY (EGD) WITH PROPOFOL ;  Surgeon: Urban Garden, MD;  [facility];  Service: Gastroenterology;  Laterality: N/A;  11:15AM;ASA 3   FOOT SURGERY Right    bunionectomy   HEMORRHOID SURGERY     IR  EXCHANGE BILIARY DRAIN  10/19/2022   IR EXCHANGE BILIARY DRAIN  12/14/2022   IR EXCHANGE BILIARY DRAIN  02/08/2023   IR PERC CHOLECYSTOSTOMY  08/27/2022   POLYPECTOMY  09/19/2023   Procedure: POLYPECTOMY INTESTINAL;  Surgeon: Urban Garden, MD;  [facility];  Service: Gastroenterology;;   REMOVAL OF STONES  09/11/2022   Procedure: REMOVAL OF STONES;  Surgeon: Normie Becton., MD;  Location: [facility] ENDOSCOPY;  Service: Gastroenterology;;   Dixie Frederickson DILATION  09/19/2023   Procedure: Dixie Frederickson DILATION;  Surgeon: Urban Garden, MD;  Location: [facility];  Service: Gastroenterology;;   [address]  09/11/2022   Procedure: [address];  Surgeon: Normie Becton., MD;  Location: [facility] ENDOSCOPY;  Service: Gastroenterology;;    No family history on file.  Social History   Socioeconomic History   Marital status: Divorced    Spouse name: Not on file   Number of children: Not on file   Years of education: Not on file   Highest education level: Not on file  Occupational History   Not on file  Tobacco Use   Smoking status: Every Day    Current packs/day: 0.00    Average packs/day: 2.0 packs/day for 35.0 years (70.0 ttl pk-yrs)    Types: Cigarettes    Start date: 07/24/1980    Last attempt to quit: 07/25/2015    Years since quitting: 8.5   Smokeless tobacco: Never   Tobacco comments:    quit 2 weeks ago (November 2016)  Vaping Use   Vaping status: Never Used  Substance and Sexual Activity   Alcohol use: No    Alcohol/week: 0.0 standard drinks of alcohol   Drug use: No   Sexual activity: Not Currently    Birth control/protection: None  Other Topics Concern   Not on file  Social History Narrative   Not on file   Social Drivers of Health   Financial Resource Strain: Not on file  Food Insecurity: No Food Insecurity (09/01/2022)   Hunger Vital Sign    Worried About Running Out of Food in the Last Year: Never true    Ran Out of Food in the Last  Year: Never true  Transportation Needs: No Transportation Needs (09/01/2022)   PRAPARE - Administrator, Civil Service (Medical): No    Lack of Transportation (Non-Medical): No  Physical Activity: Not on file  Stress: Not on file  Social Connections: Not on file  Intimate Partner Violence: Not At Risk (09/01/2022)   Humiliation, Afraid, Rape, and Kick questionnaire    Fear of Current or Ex-Partner: No    Emotionally Abused: No    Physically Abused: No    Sexually Abused: No     Physical Exam   Vitals:   02/19/24 0135 02/19/24 0200  BP: (!) 145/68 130/71  Pulse: 94 95  Resp: 18 (!) 24  Temp:    SpO2: 96% 93%    CONSTITUTIONAL: Chronically ill-appearing, NAD NEURO/PSYCH: Awake and alert, oriented to name, moves all extremities equally EYES:  eyes equal and reactive ENT/NECK:  no LAD, no JVD CARDIO: Regular rate, well-perfused, normal S1 and  S2 PULM:  CTAB no wheezing or rhonchi GI/GU:  non-distended, mild left lower quadrant tenderness to palpation MSK/SPINE:  No gross deformities, no edema SKIN:  no rash, atraumatic   *Additional and/or pertinent findings included in MDM below  Diagnostic and Interventional Summary    EKG Interpretation Date/Time:  Sunday February 18 2024 23:30:06 EDT Ventricular Rate:  95 PR Interval:  146 QRS Duration:  151 QT Interval:  408 QTC Calculation: 513 R Axis:   -69  Text Interpretation: Sinus rhythm RBBB and LAFB Left ventricular hypertrophy Confirmed by Gwenetta Lennert [phone]) on 02/19/2024 1:07:34 AM       Labs Reviewed  COMPREHENSIVE METABOLIC PANEL WITH GFR - Abnormal; Notable for the following components:      Result Value   Potassium 2.8 (*)    Chloride 97 (*)    Glucose, Bld 121 (*)    Albumin 2.8 (*)    All other components within normal limits  CBC - Abnormal; Notable for the following components:   WBC 17.2 (*)    RBC 3.70 (*)    Hemoglobin 11.6 (*)    HCT 34.3 (*)    All other components within normal  limits  URINALYSIS, ROUTINE W REFLEX MICROSCOPIC - Abnormal; Notable for the following components:   Specific Gravity, Urine 1.033 (*)    All other components within normal limits  LIPASE, BLOOD  TROPONIN I (HIGH SENSITIVITY)  TROPONIN I (HIGH SENSITIVITY)    CT ABDOMEN PELVIS W CONTRAST  Final Result    DG Chest Port 1 View  Final Result      Medications  LORazepam  (ATIVAN ) injection 2 mg (0 mg Intravenous Hold 02/19/24 0048)  acetaminophen  (TYLENOL ) tablet 1,000 mg (1,000 mg Oral Given 02/18/24 2358)  alum & mag hydroxide-simeth (MAALOX/MYLANTA) 200-200-20 MG/5ML suspension 30 mL (30 mLs Oral Given 02/18/24 2358)  iohexol  (OMNIPAQUE ) 300 MG/ML solution 100 mL (100 mLs Intravenous Contrast Given 02/19/24 0031)  potassium chloride  SA (KLOR-CON  M) CR tablet 40 mEq (40 mEq Oral Given 02/19/24 0133)     Procedures  /  Critical Care Procedures  ED Course and Medical Decision Making  Initial Impression and Ddx Patient having lower quadrant abdominal pain but also having some pain to the left upper quadrant, broad differential including diverticulitis, splenic pathology, GERD, kidney stone, neoplastic process, gastroenteritis given the nausea vomiting and diarrhea.  Atypical presentation of ACS is also considered.  Past medical/surgical history that increases complexity of ED encounter: Dementia  Interpretation of Diagnostics I personally reviewed the EKG and my interpretation is as follows: Bundle branch block, nonspecific findings  Hypokalemia noted, leukocytosis, mild anemia compared to prior.  Troponin negative  Patient Reassessment and Ultimate Disposition/Management     CT is unremarkable for acute pathology.  On reassessment patient's vital signs normal, she is in no acute distress resting comfortably, wakes easily, not complaining of any pain or issues at this time.  I talked with patient's son, sounds if she is having more chronic pain in this area and diarrhea for few months,  no signs of emergent process tonight, appropriate for discharge.  Patient management required discussion with the following services or consulting groups:  None  Complexity of Problems Addressed Acute illness or injury that poses threat of life of bodily function  Additional Data Reviewed and Analyzed Further history obtained from: Further history from spouse/family member and Past medical history and medications listed in the EMR  Additional Factors Impacting ED Encounter Risk Consideration of hospitalization  Merrick Abe. Bailey Faiella,  MD [facility] Emergency Medicine [facility] [email or web address]  Final Clinical Impressions(s) / ED Diagnoses     ICD-10-CM   1. Left lower quadrant abdominal pain  R10.32       ED Discharge Orders     None        Discharge Instructions Discussed with and Provided to Patient:     Discharge Instructions      You were evaluated in the Emergency Department and after careful evaluation, we did not find any emergent condition requiring admission or further testing in the hospital.  Your exam/testing today is overall reassuring.  Recommend discussing your symptoms with your primary care doctor for future management.  Please return to the Emergency Department if you experience any worsening of your condition.   Thank you for allowing us  to be a part of your care.       Edson Graces, MD 02/19/24 [phone]

## 2024-02-19 NOTE — Discharge Instructions (Addendum)
 You were evaluated in the Emergency Department and after careful evaluation, we did not find any emergent condition requiring admission or further testing in the hospital.  Your exam/testing today is overall reassuring.  Recommend discussing your symptoms with your primary care doctor for future management.  Please return to the Emergency Department if you experience any worsening of your condition.   Thank you for allowing us  to be a part of your care.

## 2024-02-20 DIAGNOSIS — K219 Gastro-esophageal reflux disease without esophagitis: Secondary | ICD-10-CM | POA: Diagnosis not present

## 2024-02-20 DIAGNOSIS — R197 Diarrhea, unspecified: Secondary | ICD-10-CM | POA: Diagnosis not present

## 2024-02-20 DIAGNOSIS — K811 Chronic cholecystitis: Secondary | ICD-10-CM | POA: Diagnosis not present

## 2024-02-20 DIAGNOSIS — R5381 Other malaise: Secondary | ICD-10-CM | POA: Diagnosis not present

## 2024-02-26 DIAGNOSIS — J449 Chronic obstructive pulmonary disease, unspecified: Secondary | ICD-10-CM | POA: Diagnosis not present

## 2024-02-26 DIAGNOSIS — I4891 Unspecified atrial fibrillation: Secondary | ICD-10-CM | POA: Diagnosis not present

## 2024-02-26 DIAGNOSIS — I5030 Unspecified diastolic (congestive) heart failure: Secondary | ICD-10-CM | POA: Diagnosis not present

## 2024-02-27 DIAGNOSIS — B351 Tinea unguium: Secondary | ICD-10-CM | POA: Diagnosis not present

## 2024-02-27 DIAGNOSIS — E1159 Type 2 diabetes mellitus with other circulatory complications: Secondary | ICD-10-CM | POA: Diagnosis not present

## 2024-04-02 NOTE — Progress Notes (Signed)
 PROGRESS NOTE [facility] 04/02/24    Patient name: Kathleen Valenzuela. Kathleen Valenzuela DOB [birth year] MRN#: [id number] PCP: Maree Eligio BROCKS, MD Time: 5:57 PM Primary Care Provider:  Maree Eligio BROCKS, MD Inpatient primary attending provider: Margart Elsie Dragon, DO    _____________________________________  Admission HPI    Patient admitted on: 03/29/2024 12:37 PM  Patient admitted by: Donnice Manus Para, DO    CHIEF COMPLAINT: Altered mental status   Day of admission HPI:  Kathleen Valenzuela  is a 73 y.o. y.o. female with a PMH significant for COPD, DVT, bipolar disorder, chronic diastolic heart failure, hypertension, paroxysmal atrial fibrillation dementia who presented with altered mental status.  Of note, the patient was recently admitted to the hospital on July 21 for altered mental status.  Patient was thought to have a UTI at that time.  She was discharged the next day on IV antibiotics to complete at the facility.  Was thought that the patient was comfort measures only.  This was confirmed with the son at bedside on the day of discharge on 7/22.  However today, the son reports that the patient is altered and unable to take medications.  He thinks there is something seriously wrong.  When asked about comfort measures status, he states that she is not hospice, but the facility did want to make her comfortable which she thought was a good idea.   The patient is a poor historian due to her medical status, most of history is obtained from ER provider, chart review, and son at bedside.   Patient admitted on Home O2? - no Patient on home anticoagulant? -  yes, Xarelto Patient admitted with Chronic home foley catheter? - no Foley catheter placed or replaced by another service prior to admission? - no Central Line Status: NONE   Mental Status on Admission: The patient is Alert and oriented to PERSON The patient is not Alert And oriented to TIME The patient is not Alert and oriented  to LOCATION   Problem List, Assessment & Plan     ASSESSMENT & PLAN (In order of descending acuity)   Sepsis with acute organ dysfunction - Patient presented to the hospital with fever, hypotension, acute hypoxic respiratory failure, acute metabolic encephalopathy, acute kidney injury, tachycardia - CT abdomen pelvis with no signs of infection, however this was without contrast due to renal function - Patient did receive fluid bolus of 1 L, did not do 30 cc/kg due to elevated BNP and concern for respiratory failure and possible heart failure - Lactic acid was initially elevated to 3.2, down trended to 1.8 after the 1 L fluid bolus - Blood cultures are no growth to date - Urine culture growing Enterococcus raffinosus and Candida glabrata, feel like the fungal is a contaminant - Continue IV cefepime  and vancomycin    Acute hypoxic respiratory failure, resolved - Patient came to the hospital desaturating, required nonrebreather mask up to 15 L - Weaned to room air, and remained stable   Elevated troponin level - Likely in the setting of increased demand with sepsis, myocardial injury - Unable to get history about chest pain - These are downtrending and flat in distribution - Echocardiogram with normal LVEF, mild to moderate valvular disease, normal RV size and function, ECG reassuring   Acute metabolic encephalopathy - Most likely due to patient's uncontrolled infection and hypernatremia, however there could be a component of toxic encephalopathy as well due to polypharmacy - Patient has a history of bipolar is on multiple  medications - She also is on multiple medications that can worsen mental status in the setting of acute kidney injury - No concern for stroke at this time, patient did have a recent CT head on 7/20 that was negative   Acute kidney injury on CKD stage IIIa - Patient has a baseline creatinine of around 1.28, presented with a creatinine of 2.42 - In the setting of  dehydration and sepsis - Continue IV fluids at a gentle rate for hydration, adjusted to D5 water  to help with hypernatremia   Hypernatremia - In the setting of dehydration - Continue D5W   Hypokalemia - Resolved   Hypercalcemia - PTH is elevated - Patient is found to have a pulmonary mass invading the ribs on the left, this could be because of hypercalcemia - Patient was given bisphosphonate to help with hypercalcemia - This has improved numbers and slowly improving   Pulmonary mass - Patient has a consolidative opacity in the left lower lobe with invasion into adjacent osseous structures which could reflect a primary pulmonary malignancy - Patient is DNR/DNI and Valenzuela-term at a facility, do not feel that she would benefit from biopsy at this time because she is not a candidate for treatment - Son was updated - Plan for CT chest with contrast once kidney function improves, likely 7/30   History of dementia - This complicates care, patient's decision maker is son   Bipolar disorder - Patient is on multiple medications   Paroxysmal atrial fibrillation - Holding Xarelto until patient able to take p.o. - Will restart once mental status improves - Monitor   ADDITIONAL NON-ACUTE FINDINGS, OBSERVATIONS, FAMILY DISCUSSIONS, ETC. (When present):   Physical Exam GENERAL: Patient appears unwell, lying supine with arms curled up to chest. CHEST: Right lower lobe wheezing. CARDIOVASCULAR: Heart regular rhythm. ABDOMEN: Abdomen soft, non-tender.     DVT Prophylaxis Ordered: SQ Heparin  while holding Xarelto _____________________________________  Timeline of Significant Events: 7/25: Patient admitted to the hospital for sepsis.   Temp:  [36.4 C (97.5 F)-37 C (98.6 F)] 36.4 C (97.5 F) Pulse:  [80-105] 95 SpO2 Pulse:  [81-105] 94 Resp:  [18-39] 23 BP: (96-179)/(40-98) 137/62 SpO2:  [90 %-100 %] 96 % Body mass index is 25.42 kg/m. Intake/Output last 3 shifts: I/O last 3  completed shifts: In: 1871.7 [I.V.:1353.8; IV Piggyback:517.9] Out: 1600 [Urine:1600]  Consults Requested  IP CONSULT TO [facility] IP CONSULT TO NUTRITION SERVICES     In hospital Nutrition: NPO Sips with ice chips; Medically necessary Unable to complete Malnutrition Assessment at this time due to (comment) (04/01/24 1615)  An advanced care planning discussion is  had with patient and/or patient's decisions maker (documented separately).  CODE STATUS :                    DNR and DNI   Discharge estimated within TBD Anticipated disposition:  To Skilled Nursing Facility ______________________________________________________________  Current Medications[1] ________________________________________________________________  Allergies  Allergen Reactions  . Penicillins Rash    Has patient had a PCN reaction causing immediate rash, facial/tongue/throat swelling, SOB or lightheadedness with hypotension: Yes Has patient had a PCN reaction causing severe rash involving mucus membranes or skin necrosis: No Has patient had a PCN reaction that required hospitalization: No Has patient had a PCN reaction occurring within the last 10 years: No If all of the above answers are NO, then may proceed with Cephalosporin use.     Past Medical History[2]  Past Surgical History[3]   Family  History[4]        Imaging  Echocardiogram W Colorflow Spectral Doppler Result Date: 04/01/2024 Patient Info Name:     Vieva Brummitt Chilton Mathey Age:     73 years DOB:     Sep 14, 1950 Gender:     Female MRN:     [id number] Accession #:     [id number] [facility] Account #:     [id number] Ht:     158 cm Wt:     63 kg BSA:     1.67 m2 BP:     136 /     44 mmHg HR:     86 bpm Exam Date:     04/01/2024 12:03 PM Admit Date:     03/29/2024 Exam Type:     ECHOCARDIOGRAM W COLORFLOW SPECTRAL DOPPLER Technical Quality:     Poor Reason for Poor Study:     poor patient cooperation Staff Sonographer:     Mliss Gate  Supervising Physician:     Marinell Fairy How MD Ordering Physician:     Donnice Manus Para Study Info Indications      - elevated BNP and troponin Procedure(s)   Complete two-dimensional, color flow and Doppler transthoracic echocardiogram is performed. Summary   1. Technically difficult study.   2. The left ventricle is normal in size with normal wall thickness.   3. The left ventricular systolic function is normal, LVEF is visually estimated at > 55%.   4. The mitral valve leaflets are moderately thickened with reduced leaflet mobility.   5. Mitral annular calcification is present.   6. There is moderate mitral stenosis.   7. There is mild aortic regurgitation.   8. There is mild aortic valve stenosis.   9. The right ventricle is normal in size, with normal systolic function. Left Ventricle   The left ventricle is normal in size with normal wall thickness. The left ventricular systolic function is normal, LVEF is visually estimated at > 55%. Left ventricular diastolic function cannot be accurately assessed. Right Ventricle   The right ventricle is normal in size, with normal systolic function. Left Atrium   The left atrium is upper normal in size. Right Atrium   The right atrium is normal in size. Aortic Valve   Suboptimally imaged aortic valve appears thickened. There is mild aortic regurgitation. There is mild aortic valve stenosis. Peak AV transvalvular velocity:  2.2 m/s. Mean gradient: 8 mmHg. Doppler velocity index: 0.51. Estimated aortic valve area (VTI): 1.7 cm2. Mitral Valve   The mitral valve leaflets are moderately thickened with reduced leaflet mobility. Mitral annular calcification is present. There is trivial mitral valve regurgitation. There is moderate mitral stenosis. Mean diastolic gradient: 7 mmHg at a heart rate of 88 bpm. Tricuspid Valve   The tricuspid valve leaflets are normal, with normal leaflet mobility. There is no significant tricuspid regurgitation. TR maximum velocity: 2.2 m/s.  Pulmonic Valve   Pulmonary valve is not well visualized. There is no significant pulmonic regurgitation. There is no evidence of a significant transvalvular gradient. Aorta   The aorta is normal in size in the visualized segments. Inferior Vena Cava   The IVC is not well visualized precluding the ability to accurate assess right atrial pressure. Pericardium/Pleural   There is no pericardial effusion. Ventricles ---------------------------------------------------------------------- Name                                 Value  Normal ---------------------------------------------------------------------- LV Dimensions 2D/MM ----------------------------------------------------------------------  IVS Diastolic Thickness (2D)                                1.0 cm       0.6-0.9 LVID Diastole (2D)                  5.3 cm       3.8-5.2  LVPW Diastolic Thickness (2D)                                0.9 cm       0.6-0.9 LVID Systole (2D)                   3.9 cm       2.2-3.5 LVOT Diameter                       1.9 cm               LV Mass Index (2D Cubed)          112 g/m2         43-95  Relative Wall Thickness (2D)                                  0.34        <=0.42 LV Function ---------------------------------------------------------------------- LV EF (4C MOD)                        56 %                LV Diastolic Volume Index (BP MOD)                        43.7 ml/m2     29.0-61.0 LV EF (BP MOD)                        63 %         54-74 RV Dimensions 2D/MM ----------------------------------------------------------------------  RV Basal Diastolic Dimension                           3.0 cm       2.5-4.1 Atria ---------------------------------------------------------------------- Name                                 Value        Normal ---------------------------------------------------------------------- LA Dimensions ---------------------------------------------------------------------- LA Dimension (2D)                    3.9 cm       2.7-3.8 LA Volume Index (4C A-L)        12.80 ml/m2               LA Volume Index (2C A-L)        18.23 ml/m2               RA Dimensions ---------------------------------------------------------------------- RA Area (4C)                      11.1 cm2        <=  18.0 RA Area (4C) Index              6.6 cm2/m2               RA ESV Index (4C MOD)             14 ml/m2         15-27 Left Ventricular Outflow Tract ---------------------------------------------------------------------- Name                                 Value        Normal ---------------------------------------------------------------------- LVOT 2D ---------------------------------------------------------------------- LVOT Diameter                       1.9 cm               LVOT Area                          2.8 cm2               LVOT Doppler ---------------------------------------------------------------------- LVOT Peak Velocity                 1.1 m/s               LVOT VTI                             21 cm               LVOT Stroke Volume                   60 ml               LVOT SI                           36 ml/m2 Aortic Valve ---------------------------------------------------------------------- Name                                 Value        Normal ---------------------------------------------------------------------- AV Doppler ---------------------------------------------------------------------- AV Peak Velocity                   2.2 m/s               AV Peak Gradient                   19 mmHg               AV Mean Gradient                    8 mmHg               AV VTI                               35 cm               AV Area (Cont Eq VTI)              1.7 cm2         >=3.0 AV Area Index (Cont Eq VTI)     1.0 cm2/m2  AV Area (Cont Eq Vel)              1.4 cm2               AV Area Index (Cont Eq Vel)     0.9 cm2/m2               AV DI (Vel)                           0.51               AV DI (VTI)                            0.61 Mitral Valve ---------------------------------------------------------------------- Name                                 Value        Normal ---------------------------------------------------------------------- MV Doppler ---------------------------------------------------------------------- MV Mean Gradient                    7 mmHg               MV Diastolic Function ---------------------------------------------------------------------- MV E Peak Velocity                103 cm/s               MV A Peak Velocity                175 cm/s               MV E/A                                 0.6               MV Annular TDI ---------------------------------------------------------------------- MV Septal e' Velocity             7.7 cm/s         >=8.0 MV E/e' (Septal)                      13.3               MV Lateral e' Velocity           12.1 cm/s        >=10.0 MV E/e' (Lateral)                      8.5               MV e' Average                     9.9 cm/s               MV E/e' (Average)                     10.9 Tricuspid Valve ---------------------------------------------------------------------- Name                                 Value        Normal ---------------------------------------------------------------------- TV Regurgitation Doppler ---------------------------------------------------------------------- TR Peak Velocity  2.2 m/s Aorta ---------------------------------------------------------------------- Name                                 Value        Normal ---------------------------------------------------------------------- Ascending Aorta ---------------------------------------------------------------------- Ao Root Diameter (2D)               2.4 cm               Ao Root Diam Index (2D)          1.4 cm/m2 Venous ---------------------------------------------------------------------- Name                                 Value        Normal  ---------------------------------------------------------------------- IVC/SVC ---------------------------------------------------------------------- IVC Diameter (Insp 2D)              0.8 cm               IVC Diameter (Exp 2D)               1.5 cm         <=2.1  IVC Diameter Percent Change (2D)                                  47 %          >=50 Report Signatures Finalized by Marinell Fairy How  MD on 04/01/2024 06:51 PM  XR Chest Portable Result Date: 03/30/2024 Exam:  Portable Chest  History:  Shortness of breath.  Technique:  Single frontal view.  Comparison:  Chest x-ray dated 03/29/2024.  Findings:   The heart and mediastinum are stable.  The trachea is midline.  There is no new airspace disease identified.  There is no visible pneumothorax or pleural effusion.  The osseous structures are unchanged.    No acute intrathoracic pathology identified.  Signed (Electronic Signature): 03/30/2024 2:45 PM Signed By: Cleatus SHAUNNA Brazier, MD  CT Abdomen Pelvis Wo Contrast Result Date: 03/29/2024 Exam:  CT of the Abdomen and Pelvis without Contrast  History:  Sepsis, abdominal tenderness  Technique: Routine CT of the abdomen and pelvis without IV contrast. Oral Contrast:  no.  AEC (automated exposure control) and/or manual techniques such as size-specific kV and mAs are employed where appropriate to reduce radiation exposure for all CT exams.  Comparison:   CT abdomen pelvis 04/27/2018  Abdomen and Pelvis CT Findings:  LOWER THORAX:  Consolidative opacity in the left lower lobe with invasion into adjacent osseous structures depicted on series 2 image 11 for example. Small left pleural fluid.  HEPATOBILIARY:  Hepatic steatosis. Motion artifact limits evaluation. Possible small cholelithiasis. No biliary duct dilation.  PANCREAS:  Normal.  SPLEEN:  Normal.  ADRENALS:  Mild thickening of the adrenal glands.  KIDNEYS/URETERS:  Normal.  VASCULAR:  The aorta is not dilated. Atherosclerotic calcifications.  LYMPH  NODES:  No adenopathy.  BOWEL/MESENTERY:  No abnormal bowel dilation. Normal appendix. Colonic diverticula.  PELVIC ORGANS:  Uterus is present. No suspicious adnexal mass. Urinary bladder mostly decompressed with Foley catheter present.  BONES/SOFT TISSUES:  Degenerative changes of the spine. Curvature of the spine. Some coarse calcifications in the buttocks may be related to injections.    Consolidative opacity in the left lower lobe with invasion into adjacent osseous structures. This may reflect  primary pulmonary malignancy. Recommend contrast enhanced CT of the chest for further evaluation. Biopsy can be performed for pathologic diagnosis.  Signed (Electronic Signature): 03/29/2024 4:24 PM Signed By: Luetta Ambrosia, MD  ECG 12 Lead Result Date: 03/29/2024 Sinus tachycardia Possible Left atrial enlargement Incomplete right bundle branch block Left anterior fascicular block Left ventricular hypertrophy with repolarization abnormality ( R in aVL , Cornell product , Romhilt-Estes ) Cannot rule out Septal infarct (cited on or before 10-Oct-2023) Abnormal ECG When compared with ECG of 24-Mar-2024 21:41, PR interval has decreased Vent. rate has increased by  60 bpm Incomplete right bundle branch block is now present Confirmed by Rosamond Codding [phone]) on 03/29/2024 2:43:56 PM  XR Chest Portable Result Date: 03/29/2024 Exam: Portable Chest  History:  Fever, sepsis  Technique: One frontal portable view  Comparison:  03/24/2024  Findings:    Lungs: The lungs are stable, negative for new focal and diffuse opacities. Minimal streaky opacity in the perihilar left lung is similar to the prior.  Mediastinum: The cardiomediastinal silhouette is stable.  Pleural Spaces: The pleural spaces are normal.  Bones: The regional bones are intact.     1. Stable minimal streaky opacity in the perihilar left lung as previously described. No new focal opacities are identified.  Signed (Electronic Signature): 03/29/2024 1:19 PM  Signed By: Debby LITTIE Willy MICKEY, MD   Lab Results   Recent Labs    04/02/24 0420  WBC 19.5*  HGB 10.8*  HCT 32.9*  PLT 224   Recent Labs    03/31/24 0539 03/31/24 1130 04/02/24 0420  NA 151*   < > 152*  K 4.1   < > 3.9  CL 120*   < > 120*  CO2 23.2   < > 23.1  BUN 50*   < > 29*  CREATININE 1.95*   < > 1.39*  GLU 117   < > 90  CALCIUM  13.2*   < > 10.3*  MG 1.4*   < > 1.9  PHOS 3.7  --   --    < > = values in this interval not displayed.   No results for input(s): CKTOTAL, CKMB, PCTCKMB, TROPONINI, EDTPNI, BNP, INR, LABPROT, APTT, DDIMER in the last 72 hours. No results for input(s): WBCUA, NITRITE, LEUKOCYTESUR, BACTERIA, RBCUA, BLOODU, GLUCOSEU, PROTEINUA, KETONESU, KETUR in the last 72 hours. No results for input(s): OPIAU, BENZU, TRICYCLIC, PCPU, AMPHU, COCAU, CANNAU, BARBU, ETOH, ACETAMIN, SALICYLATE in the last 72 hours. No results for input(s): PREGTESTUR, PREGPOC in the last 72 hours. No results for input(s): OCCULTBLD, RAPSCRN, CDIFRPCR, CDIFFNAP1, A1C, CHOL, LDL, HDL, TRIG in the last 72 hours. No results for input(s): O2SOUR, FIO2ART, PHART, PCO2ART, PO2ART, HCO3ART, O2SATART, BEART in the last 72 hours. Pending Labs     Order Current Status   Blood Culture Preliminary result   Blood Culture Preliminary result       Margart Dragon, DO Hospitalist, [facility] 04/02/24, 5:57 PM      [1]  Current Facility-Administered Medications:  .  acetaminophen  (TYLENOL ) suppository 650 mg, 650 mg, Rectal, Q4H PRN, Bettejane Donnice Pastor, DO, 650 mg at 03/30/24 0731 .  cefepime  (MAXIPIME ) 2 g in sodium chloride  0.9 % (NS) 100 mL IVPB-connector bag, 2 g, Intravenous, Q12H, Dragon Margart Fallow, DO, Last Rate: 25 mL/hr at 04/02/24 1636, 2 g at 04/02/24 1636 .  dextrose  5 % infusion, 100 mL/hr, Intravenous, Continuous, Dragon Margart Fallow, DO, Last Rate: 100 mL/hr at  04/02/24 1357, 100 mL/hr at 04/02/24 1357 .  diphenhydrAMINE  (BENADRYL ) injection, 25 mg, Intravenous, Q6H PRN, Bettejane Donnice Pastor, DO, 25 mg at 04/02/24 0749 .  heparin  (porcine) 5,000 unit/mL injection 5,000 Units, 5,000 Units, Subcutaneous, Q8H SCH, Bettejane Donnice Pastor, DO, 5,000 Units at 04/02/24 1331 .  melatonin tablet 3 mg, 3 mg, Oral, Nightly PRN, Bettejane Donnice Pastor, DO, 3 mg at 03/29/24 2201 .  morphine  4 mg/mL injection 2 mg, 2 mg, Intravenous, Q4H PRN, Bettejane Donnice Pastor, DO, 2 mg at 04/02/24 1002 .  nystatin (MYCOSTATIN) powder 1 Application, 1 Application, Topical, BID, Bettejane Donnice Pastor, DO, 1 Application at 04/02/24 [phone] .  ondansetron  (ZOFRAN ) injection 4 mg, 4 mg, Intravenous, Q8H PRN **OR** ondansetron  (ZOFRAN ) injection 8 mg, 8 mg, Intravenous, Q8H PRN, Bettejane Donnice Pastor, DO .  polyethylene glycol (MIRALAX ) packet 17 g, 17 g, Oral, Daily PRN, Bettejane Donnice Pastor, DO .  vancomycin  (VANCOCIN ) 750 mg in sodium chloride  0.9 % IVPB (premix), 750 mg, Intravenous, Q24H, Bettejane Donnice Pastor, DO, Stopped at 04/02/24 1420 [2] Past Medical History: Diagnosis Date  . Anxiety   . Colonic diverticular abscess   . COPD (chronic obstructive pulmonary disease)      . DVT (deep venous thrombosis)      [3] Past Surgical History: Procedure Laterality Date  . BACK SURGERY    [4] History reviewed. No pertinent family history.

## 2024-05-06 DEATH — deceased

## 2024-06-17 ENCOUNTER — Encounter (INDEPENDENT_AMBULATORY_CARE_PROVIDER_SITE_OTHER): Payer: Self-pay | Admitting: Gastroenterology

## 2024-06-17 ENCOUNTER — Ambulatory Visit (INDEPENDENT_AMBULATORY_CARE_PROVIDER_SITE_OTHER): Payer: 59 | Admitting: Gastroenterology

## 2024-07-10 ENCOUNTER — Encounter (INDEPENDENT_AMBULATORY_CARE_PROVIDER_SITE_OTHER): Payer: Self-pay | Admitting: Gastroenterology
